# Patient Record
Sex: Female | Born: 1965 | Race: Black or African American | Hispanic: No | Marital: Married | Smoking: Current every day smoker
Health system: Southern US, Community
[De-identification: ages and names within clinical notes are randomized; demographics above are authoritative.]

## PROBLEM LIST (undated history)

## (undated) DIAGNOSIS — I10 Essential (primary) hypertension: Secondary | ICD-10-CM

## (undated) DIAGNOSIS — J302 Other seasonal allergic rhinitis: Secondary | ICD-10-CM

## (undated) DIAGNOSIS — F319 Bipolar disorder, unspecified: Secondary | ICD-10-CM

## (undated) DIAGNOSIS — Z973 Presence of spectacles and contact lenses: Secondary | ICD-10-CM

## (undated) DIAGNOSIS — K029 Dental caries, unspecified: Secondary | ICD-10-CM

## (undated) DIAGNOSIS — E7800 Pure hypercholesterolemia, unspecified: Secondary | ICD-10-CM

## (undated) DIAGNOSIS — Z972 Presence of dental prosthetic device (complete) (partial): Secondary | ICD-10-CM

## (undated) DIAGNOSIS — F419 Anxiety disorder, unspecified: Secondary | ICD-10-CM

## (undated) DIAGNOSIS — K219 Gastro-esophageal reflux disease without esophagitis: Secondary | ICD-10-CM

## (undated) HISTORY — PX: DENTAL SURGERY: SHX609

## (undated) HISTORY — PX: COLONOSCOPY: SHX174

## (undated) HISTORY — PX: TUBAL LIGATION: SHX77

## (undated) HISTORY — PX: UTERINE FIBROID SURGERY: SHX826

---

## 1999-10-02 ENCOUNTER — Encounter: Admission: RE | Admit: 1999-10-02 | Discharge: 1999-10-02 | Payer: Self-pay | Admitting: Nephrology

## 1999-10-02 ENCOUNTER — Encounter: Payer: Self-pay | Admitting: Nephrology

## 2001-01-28 ENCOUNTER — Emergency Department (HOSPITAL_COMMUNITY): Admission: EM | Admit: 2001-01-28 | Discharge: 2001-01-28 | Payer: Self-pay | Admitting: Emergency Medicine

## 2001-03-23 ENCOUNTER — Encounter: Payer: Self-pay | Admitting: *Deleted

## 2001-03-23 ENCOUNTER — Encounter: Admission: RE | Admit: 2001-03-23 | Discharge: 2001-03-23 | Payer: Self-pay | Admitting: *Deleted

## 2001-03-24 ENCOUNTER — Encounter
Admission: RE | Admit: 2001-03-24 | Discharge: 2001-04-27 | Payer: Self-pay | Admitting: Physical Medicine and Rehabilitation

## 2001-04-12 ENCOUNTER — Encounter: Payer: Self-pay | Admitting: Emergency Medicine

## 2001-04-12 ENCOUNTER — Emergency Department (HOSPITAL_COMMUNITY): Admission: EM | Admit: 2001-04-12 | Discharge: 2001-04-12 | Payer: Self-pay | Admitting: Emergency Medicine

## 2001-04-28 ENCOUNTER — Emergency Department (HOSPITAL_COMMUNITY): Admission: EM | Admit: 2001-04-28 | Discharge: 2001-04-28 | Payer: Self-pay | Admitting: Emergency Medicine

## 2002-01-10 ENCOUNTER — Inpatient Hospital Stay (HOSPITAL_COMMUNITY): Admission: RE | Admit: 2002-01-10 | Discharge: 2002-01-13 | Payer: Self-pay | Admitting: Obstetrics

## 2003-03-14 ENCOUNTER — Encounter: Admission: RE | Admit: 2003-03-14 | Discharge: 2003-06-12 | Payer: Self-pay

## 2003-12-10 ENCOUNTER — Ambulatory Visit (HOSPITAL_BASED_OUTPATIENT_CLINIC_OR_DEPARTMENT_OTHER): Admission: RE | Admit: 2003-12-10 | Discharge: 2003-12-10 | Payer: Self-pay | Admitting: Specialist

## 2003-12-10 ENCOUNTER — Ambulatory Visit (HOSPITAL_COMMUNITY): Admission: RE | Admit: 2003-12-10 | Discharge: 2003-12-10 | Payer: Self-pay | Admitting: Specialist

## 2004-12-26 ENCOUNTER — Ambulatory Visit (HOSPITAL_COMMUNITY): Admission: RE | Admit: 2004-12-26 | Discharge: 2004-12-26 | Payer: Self-pay | Admitting: Obstetrics

## 2005-03-29 ENCOUNTER — Emergency Department (HOSPITAL_COMMUNITY): Admission: EM | Admit: 2005-03-29 | Discharge: 2005-03-29 | Payer: Self-pay | Admitting: Family Medicine

## 2006-07-10 ENCOUNTER — Emergency Department (HOSPITAL_COMMUNITY): Admission: EM | Admit: 2006-07-10 | Discharge: 2006-07-10 | Payer: Self-pay | Admitting: Family Medicine

## 2010-08-17 ENCOUNTER — Encounter: Payer: Self-pay | Admitting: Obstetrics

## 2011-08-25 ENCOUNTER — Other Ambulatory Visit: Payer: Self-pay | Admitting: Obstetrics

## 2015-11-18 DIAGNOSIS — F331 Major depressive disorder, recurrent, moderate: Secondary | ICD-10-CM | POA: Diagnosis not present

## 2016-02-07 DIAGNOSIS — F331 Major depressive disorder, recurrent, moderate: Secondary | ICD-10-CM | POA: Diagnosis not present

## 2016-03-14 DIAGNOSIS — S161XXA Strain of muscle, fascia and tendon at neck level, initial encounter: Secondary | ICD-10-CM | POA: Diagnosis not present

## 2016-03-14 DIAGNOSIS — N959 Unspecified menopausal and perimenopausal disorder: Secondary | ICD-10-CM | POA: Diagnosis not present

## 2017-02-12 DIAGNOSIS — J302 Other seasonal allergic rhinitis: Secondary | ICD-10-CM | POA: Diagnosis not present

## 2017-02-12 DIAGNOSIS — K219 Gastro-esophageal reflux disease without esophagitis: Secondary | ICD-10-CM | POA: Diagnosis not present

## 2017-02-12 DIAGNOSIS — I10 Essential (primary) hypertension: Secondary | ICD-10-CM | POA: Diagnosis not present

## 2017-02-24 DIAGNOSIS — F331 Major depressive disorder, recurrent, moderate: Secondary | ICD-10-CM | POA: Diagnosis not present

## 2017-04-23 DIAGNOSIS — Z79899 Other long term (current) drug therapy: Secondary | ICD-10-CM | POA: Diagnosis not present

## 2017-04-23 DIAGNOSIS — I10 Essential (primary) hypertension: Secondary | ICD-10-CM | POA: Diagnosis not present

## 2017-04-23 DIAGNOSIS — J302 Other seasonal allergic rhinitis: Secondary | ICD-10-CM | POA: Diagnosis not present

## 2017-04-23 DIAGNOSIS — K219 Gastro-esophageal reflux disease without esophagitis: Secondary | ICD-10-CM | POA: Diagnosis not present

## 2017-04-23 DIAGNOSIS — Z13228 Encounter for screening for other metabolic disorders: Secondary | ICD-10-CM | POA: Diagnosis not present

## 2017-04-23 DIAGNOSIS — E559 Vitamin D deficiency, unspecified: Secondary | ICD-10-CM | POA: Diagnosis not present

## 2017-06-10 DIAGNOSIS — F331 Major depressive disorder, recurrent, moderate: Secondary | ICD-10-CM | POA: Diagnosis not present

## 2017-09-22 DIAGNOSIS — F331 Major depressive disorder, recurrent, moderate: Secondary | ICD-10-CM | POA: Diagnosis not present

## 2018-02-17 DIAGNOSIS — F331 Major depressive disorder, recurrent, moderate: Secondary | ICD-10-CM | POA: Diagnosis not present

## 2018-02-17 DIAGNOSIS — F4310 Post-traumatic stress disorder, unspecified: Secondary | ICD-10-CM | POA: Diagnosis not present

## 2018-02-17 DIAGNOSIS — R69 Illness, unspecified: Secondary | ICD-10-CM | POA: Diagnosis not present

## 2018-04-07 DIAGNOSIS — F419 Anxiety disorder, unspecified: Secondary | ICD-10-CM | POA: Diagnosis not present

## 2018-04-07 DIAGNOSIS — F4321 Adjustment disorder with depressed mood: Secondary | ICD-10-CM | POA: Diagnosis not present

## 2018-04-07 DIAGNOSIS — J309 Allergic rhinitis, unspecified: Secondary | ICD-10-CM | POA: Diagnosis not present

## 2018-04-07 DIAGNOSIS — Z886 Allergy status to analgesic agent status: Secondary | ICD-10-CM | POA: Diagnosis not present

## 2018-04-07 DIAGNOSIS — R69 Illness, unspecified: Secondary | ICD-10-CM | POA: Diagnosis not present

## 2018-04-07 DIAGNOSIS — I10 Essential (primary) hypertension: Secondary | ICD-10-CM | POA: Diagnosis not present

## 2018-04-07 DIAGNOSIS — R251 Tremor, unspecified: Secondary | ICD-10-CM | POA: Diagnosis not present

## 2018-04-07 DIAGNOSIS — K5900 Constipation, unspecified: Secondary | ICD-10-CM | POA: Diagnosis not present

## 2018-04-07 DIAGNOSIS — K08409 Partial loss of teeth, unspecified cause, unspecified class: Secondary | ICD-10-CM | POA: Diagnosis not present

## 2018-04-07 DIAGNOSIS — Z720 Tobacco use: Secondary | ICD-10-CM | POA: Diagnosis not present

## 2018-05-10 DIAGNOSIS — R69 Illness, unspecified: Secondary | ICD-10-CM | POA: Diagnosis not present

## 2018-08-31 DIAGNOSIS — I10 Essential (primary) hypertension: Secondary | ICD-10-CM | POA: Diagnosis not present

## 2018-08-31 DIAGNOSIS — Z79899 Other long term (current) drug therapy: Secondary | ICD-10-CM | POA: Diagnosis not present

## 2019-09-25 ENCOUNTER — Other Ambulatory Visit: Payer: Self-pay

## 2019-09-25 ENCOUNTER — Emergency Department (HOSPITAL_COMMUNITY): Admission: EM | Admit: 2019-09-25 | Discharge: 2019-09-25 | Discharge status: Absent without leave | Payer: Medicare HMO

## 2019-09-25 ENCOUNTER — Emergency Department (HOSPITAL_COMMUNITY)
Admission: EM | Admit: 2019-09-25 | Discharge: 2019-09-25 | Disposition: A | Discharge status: Home / Self Care | Payer: Medicare HMO | Attending: Emergency Medicine | Admitting: Emergency Medicine

## 2019-09-25 ENCOUNTER — Encounter (HOSPITAL_COMMUNITY): Payer: Self-pay

## 2019-09-25 DIAGNOSIS — Y92410 Unspecified street and highway as the place of occurrence of the external cause: Secondary | ICD-10-CM | POA: Diagnosis not present

## 2019-09-25 DIAGNOSIS — M545 Low back pain: Secondary | ICD-10-CM | POA: Insufficient documentation

## 2019-09-25 DIAGNOSIS — M542 Cervicalgia: Secondary | ICD-10-CM | POA: Insufficient documentation

## 2019-09-25 DIAGNOSIS — Y999 Unspecified external cause status: Secondary | ICD-10-CM | POA: Diagnosis not present

## 2019-09-25 DIAGNOSIS — F17210 Nicotine dependence, cigarettes, uncomplicated: Secondary | ICD-10-CM | POA: Insufficient documentation

## 2019-09-25 DIAGNOSIS — Y939 Activity, unspecified: Secondary | ICD-10-CM | POA: Insufficient documentation

## 2019-09-25 HISTORY — DX: Essential (primary) hypertension: I10

## 2019-09-25 MED ORDER — ACETAMINOPHEN 325 MG PO TABS
650.0000 mg | ORAL_TABLET | Freq: Once | ORAL | Status: AC
  Administered 2019-09-25: 650 mg via ORAL
  Filled 2019-09-25: qty 2

## 2019-09-25 MED ORDER — CYCLOBENZAPRINE HCL 10 MG PO TABS: 10.0000 mg | ORAL_TABLET | Freq: Two times a day (BID) | ORAL | 0 refills | Status: DC | PRN

## 2019-09-25 MED ORDER — LIDOCAINE 5 % EX PTCH
1.0000 | MEDICATED_PATCH | Freq: Once | CUTANEOUS | Status: DC
  Administered 2019-09-25: 1 via TRANSDERMAL
  Filled 2019-09-25: qty 1
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
## 2020-02-22 NOTE — H&P (Addendum)
HISTORY AND PHYSICAL  Vickie Alvarado is a 54 y.o. female patient with CC: Painful teeth.  No diagnosis found.  Past Medical History:  Diagnosis Date  . Hypertension     No current facility-administered medications for this encounter.   Current Outpatient Medications  Medication Sig Dispense Refill  . cyclobenzaprine (FLEXERIL) 10 MG tablet Take 1 tablet (10 mg total) by mouth 2 (two) times daily as needed for muscle spasms. 10 tablet 0   Allergies  Allergen Reactions  . Ibuprofen Anaphylaxis   Active Problems:   * No active hospital problems. *    Past Medical History:  High Blood Pressure    Medications: Gabapentin, Levocetirizine, Atorvastatin, Quetiapine, Benzotropine    Allergies:     Ibuprofen    Surgeries:   Oral Surgery sedation, C Section         Social History       Smoking: n          Alcohol:  n Drug use:    n                           Exam: BMI 27. Gross decay all remainin teeth # 2, 3, 5, 6, 10, 11, 18, 20, 21, 22, 23, 24, 25, 26, 27, 28, 29.    No purulence, edema, fluctuance, trismus. Oral cancer screening negative. Pharynx clear. No lymphadenopathy.  Heart- RRR  Lungs-Clear  Panorex:Gross decay all remainin teeth # 2, 3, 5, 6, 10, 11, 18, 20, 21, 22, 23, 24, 25, 26, 27, 28, 29.      Assessment: ASA  2. Non-restorable  Teeth  #2, 3, 5, 6, 10, 11, 18, 20, 21, 22, 23, 24, 25, 26, 27, 28, 29.              Plan: Extraction Teeth # 2, 3, 5, 6, 10, 11, 18, 20, 21, 22, 23, 24, 25, 26, 27, 28, 29. Alveoloplasty.   Hospital Day surgery.                    Ocie Doyne 02/22/2020

## 2020-02-28 ENCOUNTER — Other Ambulatory Visit (HOSPITAL_COMMUNITY)
Admission: RE | Admit: 2020-02-28 | Discharge: 2020-02-28 | Disposition: A | Discharge status: Home / Self Care | Payer: Medicare HMO | Source: Ambulatory Visit | Attending: Oral Surgery | Admitting: Oral Surgery

## 2020-02-28 DIAGNOSIS — Z01812 Encounter for preprocedural laboratory examination: Secondary | ICD-10-CM | POA: Insufficient documentation

## 2020-02-28 DIAGNOSIS — Z20822 Contact with and (suspected) exposure to covid-19: Secondary | ICD-10-CM | POA: Diagnosis not present

## 2020-02-28 LAB — SARS CORONAVIRUS 2 (TAT 6-24 HRS): SARS Coronavirus 2: NEGATIVE

## 2020-02-29 ENCOUNTER — Encounter (HOSPITAL_COMMUNITY): Payer: Self-pay | Admitting: Oral Surgery

## 2020-02-29 ENCOUNTER — Ambulatory Visit (HOSPITAL_COMMUNITY): Payer: Medicare HMO | Admitting: Registered Nurse

## 2020-02-29 ENCOUNTER — Other Ambulatory Visit: Payer: Self-pay

## 2020-02-29 NOTE — Progress Notes (Signed)
Pt denies SOB, chest pain, and being under the care of a cardiologist. Pt stated that she receives primary care at [facility]. Pt denies having a stress test, echo and cardiac cath. Pt denies having an EKG and chest x ray. Pt denies recent labs. Pt made aware to stop taking Aspirin (unless otherwise advised by surgeon), vitamins, fish oil and herbal medications. Do not take any NSAIDs ie: Ibuprofen, Advil, Naproxen (Aleve), Motrin, BC and Goody Powder. Pt reminded to quarantine. Pt verbalized understanding of all pre-op instructions.

## 2020-03-01 ENCOUNTER — Ambulatory Visit (HOSPITAL_COMMUNITY)
Admission: RE | Admit: 2020-03-01 | Discharge: 2020-03-01 | Disposition: A | Discharge status: Home / Self Care | Payer: Medicare HMO | Attending: Oral Surgery | Admitting: Oral Surgery

## 2020-03-01 ENCOUNTER — Other Ambulatory Visit: Payer: Self-pay

## 2020-03-01 ENCOUNTER — Encounter (HOSPITAL_COMMUNITY)
Admission: RE | Disposition: A | Discharge status: Home / Self Care | Payer: Self-pay | Source: Home / Self Care | Attending: Oral Surgery

## 2020-03-01 ENCOUNTER — Encounter (HOSPITAL_COMMUNITY): Payer: Self-pay | Admitting: Oral Surgery

## 2020-03-01 DIAGNOSIS — Z79899 Other long term (current) drug therapy: Secondary | ICD-10-CM | POA: Diagnosis not present

## 2020-03-01 DIAGNOSIS — Z5309 Procedure and treatment not carried out because of other contraindication: Secondary | ICD-10-CM | POA: Diagnosis not present

## 2020-03-01 DIAGNOSIS — K029 Dental caries, unspecified: Secondary | ICD-10-CM | POA: Insufficient documentation

## 2020-03-01 DIAGNOSIS — U071 COVID-19: Secondary | ICD-10-CM | POA: Insufficient documentation

## 2020-03-01 DIAGNOSIS — F17200 Nicotine dependence, unspecified, uncomplicated: Secondary | ICD-10-CM | POA: Insufficient documentation

## 2020-03-01 DIAGNOSIS — I10 Essential (primary) hypertension: Secondary | ICD-10-CM | POA: Diagnosis not present

## 2020-03-01 HISTORY — DX: Gastro-esophageal reflux disease without esophagitis: K21.9

## 2020-03-01 HISTORY — DX: Dental caries, unspecified: K02.9

## 2020-03-01 HISTORY — DX: Presence of spectacles and contact lenses: Z97.3

## 2020-03-01 HISTORY — DX: Presence of dental prosthetic device (complete) (partial): Z97.2

## 2020-03-01 HISTORY — DX: Bipolar disorder, unspecified: F31.9

## 2020-03-01 HISTORY — DX: Pure hypercholesterolemia, unspecified: E78.00

## 2020-03-01 HISTORY — DX: Other seasonal allergic rhinitis: J30.2

## 2020-03-01 HISTORY — DX: Anxiety disorder, unspecified: F41.9

## 2020-03-01 LAB — CBC
HCT: 38.2 % (ref 36.0–46.0)
Hemoglobin: 12.8 g/dL (ref 12.0–15.0)
MCH: 29.8 pg (ref 26.0–34.0)
MCHC: 33.5 g/dL (ref 30.0–36.0)
MCV: 88.8 fL (ref 80.0–100.0)
Platelets: 283 10*3/uL (ref 150–400)
RBC: 4.3 MIL/uL (ref 3.87–5.11)
RDW: 12.7 % (ref 11.5–15.5)
WBC: 9.7 10*3/uL (ref 4.0–10.5)
nRBC: 0 % (ref 0.0–0.2)

## 2020-03-01 LAB — BASIC METABOLIC PANEL
Anion gap: 12 (ref 5–15)
BUN: 6 mg/dL (ref 6–20)
CO2: 32 mmol/L (ref 22–32)
Calcium: 9 mg/dL (ref 8.9–10.3)
Chloride: 90 mmol/L — ABNORMAL LOW (ref 98–111)
Creatinine, Ser: 0.64 mg/dL (ref 0.44–1.00)
GFR calc Af Amer: >60 mL/min (ref >60)
GFR calc non Af Amer: >60 mL/min (ref >60)
Glucose, Bld: 316 mg/dL — ABNORMAL HIGH (ref 70–99)
Potassium: 3 mmol/L — ABNORMAL LOW (ref 3.5–5.1)
Sodium: 134 mmol/L — ABNORMAL LOW (ref 135–145)

## 2020-03-01 LAB — SARS CORONAVIRUS 2 BY RT PCR (HOSPITAL ORDER, PERFORMED IN ~~LOC~~ HOSPITAL LAB): SARS Coronavirus 2: POSITIVE — AB

## 2020-03-01 SURGERY — DENTAL RESTORATION/EXTRACTIONS
Anesthesia: General | Site: Mouth

## 2020-03-01 MED ORDER — OXYMETAZOLINE HCL 0.05 % NA SOLN
NASAL | Status: AC
  Filled 2020-03-01: qty 30

## 2020-03-01 MED ORDER — LACTATED RINGERS IV SOLN: INTRAVENOUS | Status: DC

## 2020-03-01 MED ORDER — SODIUM CHLORIDE 0.9 % IR SOLN
Status: DC | PRN
  Administered 2020-03-01: 1000 mL

## 2020-03-01 MED ORDER — LIDOCAINE-EPINEPHRINE 2 %-1:100000 IJ SOLN
INTRAMUSCULAR | Status: DC | PRN
  Administered 2020-03-01: 20 mL via INTRADERMAL

## 2020-03-01 MED ORDER — FENTANYL CITRATE (PF) 250 MCG/5ML IJ SOLN
INTRAMUSCULAR | Status: AC
  Filled 2020-03-01: qty 5

## 2020-03-01 MED ORDER — LIDOCAINE-EPINEPHRINE 2 %-1:100000 IJ SOLN
INTRAMUSCULAR | Status: AC
  Filled 2020-03-01: qty 1

## 2020-03-01 MED ORDER — ORAL CARE MOUTH RINSE: 15.0000 mL | Freq: Once | OROMUCOSAL | Status: DC

## 2020-03-01 MED ORDER — MIDAZOLAM HCL 2 MG/2ML IJ SOLN
INTRAMUSCULAR | Status: AC
  Filled 2020-03-01: qty 2

## 2020-03-01 MED ORDER — CEFAZOLIN SODIUM-DEXTROSE 2-4 GM/100ML-% IV SOLN
2.0000 g | INTRAVENOUS | Status: DC
  Filled 2020-03-01: qty 100

## 2020-03-01 MED ORDER — 0.9 % SODIUM CHLORIDE (POUR BTL) OPTIME
TOPICAL | Status: DC | PRN
  Administered 2020-03-01: 1000 mL

## 2020-03-01 MED ORDER — CHLORHEXIDINE GLUCONATE 0.12 % MT SOLN
15.0000 mL | Freq: Once | OROMUCOSAL | Status: DC
  Filled 2020-03-01: qty 15

## 2020-03-01 NOTE — Anesthesia Preprocedure Evaluation (Addendum)
Anesthesia Evaluation    Reviewed: Allergy & Precautions, Patient's Chart, lab work & pertinent test results, Unable to perform ROS - Chart review only  Airway        Dental   Pulmonary neg pulmonary ROS, Current Smoker and Patient abstained from smoking.,           Cardiovascular hypertension, negative cardio ROS       Neuro/Psych PSYCHIATRIC DISORDERS Anxiety Bipolar Disorder negative neurological ROS  negative psych ROS   GI/Hepatic negative GI ROS, Neg liver ROS, GERD  ,  Endo/Other  negative endocrine ROS  Renal/GU negative Renal ROS     Musculoskeletal negative musculoskeletal ROS (+)   Abdominal   Peds  Hematology negative hematology ROS (+)   Anesthesia Other Findings   Reproductive/Obstetrics negative OB ROS                            Anesthesia Physical Anesthesia Plan  ASA: III  Anesthesia Plan: General   Post-op Pain Management:    Induction: Intravenous  PONV Risk Score and Plan: Midazolam, Ondansetron, Dexamethasone and Treatment may vary due to age or medical condition  Airway Management Planned: Video Laryngoscope Planned and Nasal ETT  Additional Equipment: None  Intra-op Plan:   Post-operative Plan: Extubation in OR  Informed Consent: I have reviewed the patients History and Physical, chart, labs and discussed the procedure including the risks, benefits and alternatives for the proposed anesthesia with the patient or authorized representative who has indicated his/her understanding and acceptance.     Dental advisory given  Plan Discussed with: CRNA  Anesthesia Plan Comments: (Febrile (100.8) in preop, some mild cough. Rapid covid given current state of spread in our region.)       Anesthesia Quick Evaluation

## 2020-03-01 NOTE — Progress Notes (Signed)
Patient's presented with a temperature of 100.8.  Patient stated that she had a runny nose earlier in the week.  Patient's covid test was negative on 02/28/20.  Dr. Renold Don notified of patient's temperature and gave verbal order to repeat covid test.  Patient's covid test positive.  Dr. Barbette Merino aware and procedure is cancelled.  Patient aware and was educated that she needs to quarantine for 14 days.  Encouraged patient to have her family members tested as well.  Patient verbalized understanding.  Patient instructed to call Dr. Randa Evens office to reschedule procedure after quarantine period.

## 2020-03-01 NOTE — H&P (Signed)
H&P documentation  -History and Physical Reviewed  -Patient has been re-examined  -No change in the plan of care  Vickie Alvarado  

## 2020-03-01 NOTE — Progress Notes (Signed)
IV removed, catheter intact.  Site clean and dry.   Patient taken to main entrance in wheelchair by nurse.  Patient was picked up by daughter.

## 2020-03-14 ENCOUNTER — Other Ambulatory Visit: Payer: Self-pay

## 2020-03-14 ENCOUNTER — Ambulatory Visit (HOSPITAL_COMMUNITY)
Admission: EM | Admit: 2020-03-14 | Discharge: 2020-03-14 | Disposition: A | Discharge status: Home / Self Care | Payer: Medicare HMO | Attending: Family Medicine | Admitting: Family Medicine

## 2020-03-14 DIAGNOSIS — Z01812 Encounter for preprocedural laboratory examination: Secondary | ICD-10-CM | POA: Diagnosis present

## 2020-03-14 DIAGNOSIS — Z20822 Contact with and (suspected) exposure to covid-19: Secondary | ICD-10-CM | POA: Diagnosis not present

## 2020-03-14 LAB — SARS CORONAVIRUS 2 (TAT 6-24 HRS): SARS Coronavirus 2: NEGATIVE

## 2020-03-14 NOTE — ED Triage Notes (Signed)
Pt requesting COVID testing, denies symptoms

## 2020-03-20 ENCOUNTER — Other Ambulatory Visit: Payer: Self-pay

## 2020-03-20 ENCOUNTER — Emergency Department (HOSPITAL_COMMUNITY): Payer: Medicare HMO

## 2020-03-20 ENCOUNTER — Inpatient Hospital Stay (HOSPITAL_COMMUNITY)
Admission: EM | Admit: 2020-03-20 | Discharge: 2020-05-03 | DRG: 003 | Disposition: A | Discharge status: Skilled Nursing Facility | Payer: Medicare HMO | Attending: Internal Medicine | Admitting: Internal Medicine

## 2020-03-20 ENCOUNTER — Encounter (HOSPITAL_COMMUNITY): Payer: Self-pay | Admitting: Emergency Medicine

## 2020-03-20 ENCOUNTER — Inpatient Hospital Stay (HOSPITAL_COMMUNITY): Payer: Medicare HMO

## 2020-03-20 DIAGNOSIS — D638 Anemia in other chronic diseases classified elsewhere: Secondary | ICD-10-CM | POA: Diagnosis present

## 2020-03-20 DIAGNOSIS — E46 Unspecified protein-calorie malnutrition: Secondary | ICD-10-CM | POA: Diagnosis present

## 2020-03-20 DIAGNOSIS — R29810 Facial weakness: Secondary | ICD-10-CM | POA: Diagnosis present

## 2020-03-20 DIAGNOSIS — F1210 Cannabis abuse, uncomplicated: Secondary | ICD-10-CM | POA: Diagnosis present

## 2020-03-20 DIAGNOSIS — E7800 Pure hypercholesterolemia, unspecified: Secondary | ICD-10-CM | POA: Diagnosis present

## 2020-03-20 DIAGNOSIS — J302 Other seasonal allergic rhinitis: Secondary | ICD-10-CM | POA: Diagnosis present

## 2020-03-20 DIAGNOSIS — G939 Disorder of brain, unspecified: Secondary | ICD-10-CM

## 2020-03-20 DIAGNOSIS — F17210 Nicotine dependence, cigarettes, uncomplicated: Secondary | ICD-10-CM | POA: Diagnosis present

## 2020-03-20 DIAGNOSIS — R29898 Other symptoms and signs involving the musculoskeletal system: Secondary | ICD-10-CM | POA: Diagnosis present

## 2020-03-20 DIAGNOSIS — F319 Bipolar disorder, unspecified: Secondary | ICD-10-CM | POA: Diagnosis present

## 2020-03-20 DIAGNOSIS — B954 Other streptococcus as the cause of diseases classified elsewhere: Secondary | ICD-10-CM | POA: Diagnosis present

## 2020-03-20 DIAGNOSIS — Z7189 Other specified counseling: Secondary | ICD-10-CM | POA: Diagnosis not present

## 2020-03-20 DIAGNOSIS — R059 Cough, unspecified: Secondary | ICD-10-CM | POA: Diagnosis not present

## 2020-03-20 DIAGNOSIS — Z6841 Body Mass Index (BMI) 40.0 and over, adult: Secondary | ICD-10-CM | POA: Diagnosis not present

## 2020-03-20 DIAGNOSIS — Z515 Encounter for palliative care: Secondary | ICD-10-CM

## 2020-03-20 DIAGNOSIS — I63432 Cerebral infarction due to embolism of left posterior cerebral artery: Secondary | ICD-10-CM | POA: Diagnosis present

## 2020-03-20 DIAGNOSIS — R627 Adult failure to thrive: Secondary | ICD-10-CM | POA: Diagnosis not present

## 2020-03-20 DIAGNOSIS — G9389 Other specified disorders of brain: Secondary | ICD-10-CM | POA: Diagnosis present

## 2020-03-20 DIAGNOSIS — I10 Essential (primary) hypertension: Secondary | ICD-10-CM | POA: Diagnosis present

## 2020-03-20 DIAGNOSIS — R471 Dysarthria and anarthria: Secondary | ICD-10-CM | POA: Diagnosis present

## 2020-03-20 DIAGNOSIS — G0490 Encephalitis and encephalomyelitis, unspecified: Secondary | ICD-10-CM | POA: Diagnosis not present

## 2020-03-20 DIAGNOSIS — R8290 Unspecified abnormal findings in urine: Secondary | ICD-10-CM | POA: Diagnosis present

## 2020-03-20 DIAGNOSIS — R4182 Altered mental status, unspecified: Secondary | ICD-10-CM | POA: Diagnosis not present

## 2020-03-20 DIAGNOSIS — E1165 Type 2 diabetes mellitus with hyperglycemia: Secondary | ICD-10-CM | POA: Diagnosis present

## 2020-03-20 DIAGNOSIS — R140 Abdominal distension (gaseous): Secondary | ICD-10-CM | POA: Diagnosis not present

## 2020-03-20 DIAGNOSIS — L89154 Pressure ulcer of sacral region, stage 4: Secondary | ICD-10-CM | POA: Diagnosis not present

## 2020-03-20 DIAGNOSIS — J9690 Respiratory failure, unspecified, unspecified whether with hypoxia or hypercapnia: Secondary | ICD-10-CM

## 2020-03-20 DIAGNOSIS — Z20822 Contact with and (suspected) exposure to covid-19: Secondary | ICD-10-CM | POA: Diagnosis present

## 2020-03-20 DIAGNOSIS — G9341 Metabolic encephalopathy: Secondary | ICD-10-CM | POA: Diagnosis not present

## 2020-03-20 DIAGNOSIS — I6340 Cerebral infarction due to embolism of unspecified cerebral artery: Secondary | ICD-10-CM | POA: Insufficient documentation

## 2020-03-20 DIAGNOSIS — R159 Full incontinence of feces: Secondary | ICD-10-CM | POA: Diagnosis not present

## 2020-03-20 DIAGNOSIS — Z79899 Other long term (current) drug therapy: Secondary | ICD-10-CM | POA: Diagnosis not present

## 2020-03-20 DIAGNOSIS — Z978 Presence of other specified devices: Secondary | ICD-10-CM

## 2020-03-20 DIAGNOSIS — G928 Other toxic encephalopathy: Secondary | ICD-10-CM | POA: Diagnosis present

## 2020-03-20 DIAGNOSIS — G060 Intracranial abscess and granuloma: Principal | ICD-10-CM

## 2020-03-20 DIAGNOSIS — J95851 Ventilator associated pneumonia: Secondary | ICD-10-CM | POA: Diagnosis not present

## 2020-03-20 DIAGNOSIS — Z8616 Personal history of COVID-19: Secondary | ICD-10-CM

## 2020-03-20 DIAGNOSIS — G936 Cerebral edema: Secondary | ICD-10-CM | POA: Diagnosis present

## 2020-03-20 DIAGNOSIS — K029 Dental caries, unspecified: Secondary | ICD-10-CM | POA: Diagnosis present

## 2020-03-20 DIAGNOSIS — Z23 Encounter for immunization: Secondary | ICD-10-CM | POA: Diagnosis present

## 2020-03-20 DIAGNOSIS — K219 Gastro-esophageal reflux disease without esophagitis: Secondary | ICD-10-CM | POA: Diagnosis present

## 2020-03-20 DIAGNOSIS — J9601 Acute respiratory failure with hypoxia: Secondary | ICD-10-CM | POA: Diagnosis not present

## 2020-03-20 DIAGNOSIS — G935 Compression of brain: Secondary | ICD-10-CM | POA: Diagnosis present

## 2020-03-20 DIAGNOSIS — K056 Periodontal disease, unspecified: Secondary | ICD-10-CM | POA: Diagnosis present

## 2020-03-20 DIAGNOSIS — Z931 Gastrostomy status: Secondary | ICD-10-CM

## 2020-03-20 DIAGNOSIS — R404 Transient alteration of awareness: Secondary | ICD-10-CM | POA: Diagnosis not present

## 2020-03-20 DIAGNOSIS — F419 Anxiety disorder, unspecified: Secondary | ICD-10-CM | POA: Diagnosis present

## 2020-03-20 DIAGNOSIS — J9611 Chronic respiratory failure with hypoxia: Secondary | ICD-10-CM | POA: Diagnosis not present

## 2020-03-20 DIAGNOSIS — Z8249 Family history of ischemic heart disease and other diseases of the circulatory system: Secondary | ICD-10-CM

## 2020-03-20 DIAGNOSIS — E876 Hypokalemia: Secondary | ICD-10-CM

## 2020-03-20 DIAGNOSIS — R4701 Aphasia: Secondary | ICD-10-CM | POA: Diagnosis present

## 2020-03-20 DIAGNOSIS — E785 Hyperlipidemia, unspecified: Secondary | ICD-10-CM | POA: Diagnosis present

## 2020-03-20 DIAGNOSIS — R1310 Dysphagia, unspecified: Secondary | ICD-10-CM | POA: Diagnosis present

## 2020-03-20 LAB — DIFFERENTIAL
Abs Immature Granulocytes: 0.06 10*3/uL (ref 0.00–0.07)
Basophils Absolute: 0 10*3/uL (ref 0.0–0.1)
Basophils Relative: 0 %
Eosinophils Absolute: 0 10*3/uL (ref 0.0–0.5)
Eosinophils Relative: 0 %
Immature Granulocytes: 1 %
Lymphocytes Relative: 22 %
Lymphs Abs: 2.1 10*3/uL (ref 0.7–4.0)
Monocytes Absolute: 0.6 10*3/uL (ref 0.1–1.0)
Monocytes Relative: 7 %
Neutro Abs: 6.7 10*3/uL (ref 1.7–7.7)
Neutrophils Relative %: 70 %

## 2020-03-20 LAB — COMPREHENSIVE METABOLIC PANEL
ALT: 14 U/L (ref 0–44)
AST: 11 U/L — ABNORMAL LOW (ref 15–41)
Albumin: 3.2 g/dL — ABNORMAL LOW (ref 3.5–5.0)
Alkaline Phosphatase: 103 U/L (ref 38–126)
Anion gap: 13 (ref 5–15)
BUN: 11 mg/dL (ref 6–20)
CO2: 24 mmol/L (ref 22–32)
Calcium: 9.3 mg/dL (ref 8.9–10.3)
Chloride: 97 mmol/L — ABNORMAL LOW (ref 98–111)
Creatinine, Ser: 0.44 mg/dL (ref 0.44–1.00)
GFR calc Af Amer: >60 mL/min (ref >60)
GFR calc non Af Amer: >60 mL/min (ref >60)
Glucose, Bld: 215 mg/dL — ABNORMAL HIGH (ref 70–99)
Potassium: 3.6 mmol/L (ref 3.5–5.1)
Sodium: 134 mmol/L — ABNORMAL LOW (ref 135–145)
Total Bilirubin: 0.6 mg/dL (ref 0.3–1.2)
Total Protein: 7.2 g/dL (ref 6.5–8.1)

## 2020-03-20 LAB — URINALYSIS, ROUTINE W REFLEX MICROSCOPIC
Bilirubin Urine: NEGATIVE
Glucose, UA: 50 mg/dL — AB
Hgb urine dipstick: NEGATIVE
Ketones, ur: NEGATIVE mg/dL
Nitrite: NEGATIVE
Protein, ur: 100 mg/dL — AB
Specific Gravity, Urine: >1.046 — ABNORMAL HIGH (ref 1.005–1.030)
pH: 6 (ref 5.0–8.0)

## 2020-03-20 LAB — CBC
HCT: 37.6 % (ref 36.0–46.0)
Hemoglobin: 12.5 g/dL (ref 12.0–15.0)
MCH: 29.3 pg (ref 26.0–34.0)
MCHC: 33.2 g/dL (ref 30.0–36.0)
MCV: 88.1 fL (ref 80.0–100.0)
Platelets: 360 10*3/uL (ref 150–400)
RBC: 4.27 MIL/uL (ref 3.87–5.11)
RDW: 13.9 % (ref 11.5–15.5)
WBC: 9.5 10*3/uL (ref 4.0–10.5)
nRBC: 0 % (ref 0.0–0.2)

## 2020-03-20 LAB — APTT: aPTT: 30 seconds (ref 24–36)

## 2020-03-20 LAB — CBG MONITORING, ED: Glucose-Capillary: 220 mg/dL — ABNORMAL HIGH (ref 70–99)

## 2020-03-20 LAB — ETHANOL: Alcohol, Ethyl (B): <10 mg/dL (ref <10)

## 2020-03-20 LAB — I-STAT BETA HCG BLOOD, ED (MC, WL, AP ONLY): I-stat hCG, quantitative: <5 m[IU]/mL (ref <5)

## 2020-03-20 LAB — RAPID URINE DRUG SCREEN, HOSP PERFORMED
Amphetamines: NOT DETECTED
Barbiturates: NOT DETECTED
Benzodiazepines: NOT DETECTED
Cocaine: NOT DETECTED
Opiates: NOT DETECTED
Tetrahydrocannabinol: POSITIVE — AB

## 2020-03-20 LAB — PROTIME-INR
INR: 1.2 (ref 0.8–1.2)
Prothrombin Time: 14.4 seconds (ref 11.4–15.2)

## 2020-03-20 IMAGING — MR MR HEAD WO/W CM
5 of 14 series · 14 of 48 positions shown · IV contrast (Yes GAD)
Comparison: CT studies earlier same day

CLINICAL DATA: Acute right-sided weakness. Brain tumor shown by CT
angiography.

EXAM:
MRI HEAD WITHOUT AND WITH CONTRAST
TECHNIQUE: Multiplanar, multiecho pulse sequences of the brain and surrounding
structures were obtained without and with intravenous contrast.
CONTRAST:  8.8mL GADAVIST GADOBUTROL 1 MMOL/ML IV SOLN

[Series 2: DWI · axial · 3.0mm · 0.94mm/px · z∈[-150,-7]mm · 6 of 100 slices shown (1 of 2)]
[im 1/100]
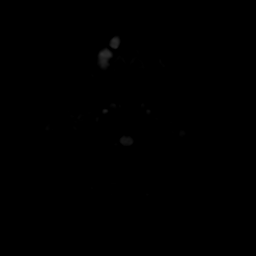
[im 20/100]
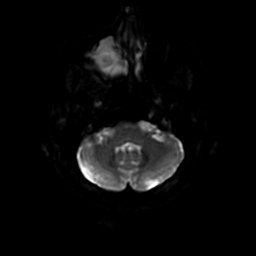
[im 40/100]
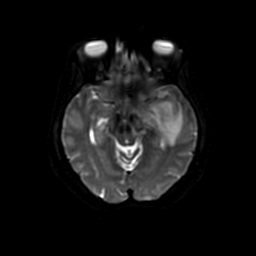
[im 60/100]
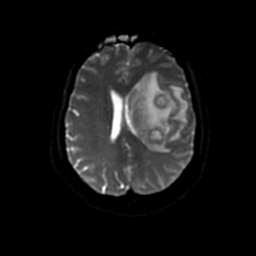
[im 80/100]
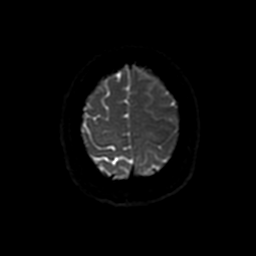
[im 100/100]
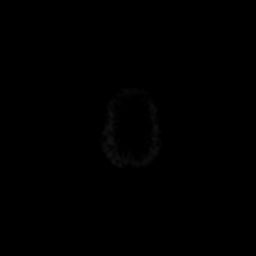

[Series 3: DWI · coronal · 4.0mm · 0.94mm/px · 2 of 74 slices shown (2 of 2)]
[im 1/74]
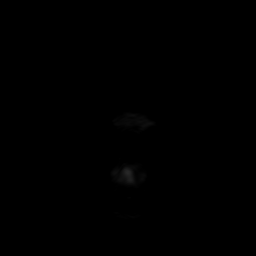
[im 19/74]
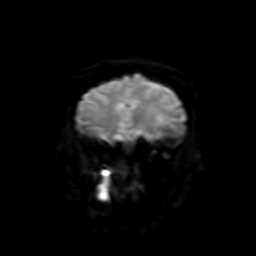

[Series 4: FLAIR · sagittal · 5.0mm · 0.23mm/px · 2 of 23 slices shown (1 of 2)]
[im 1/23]
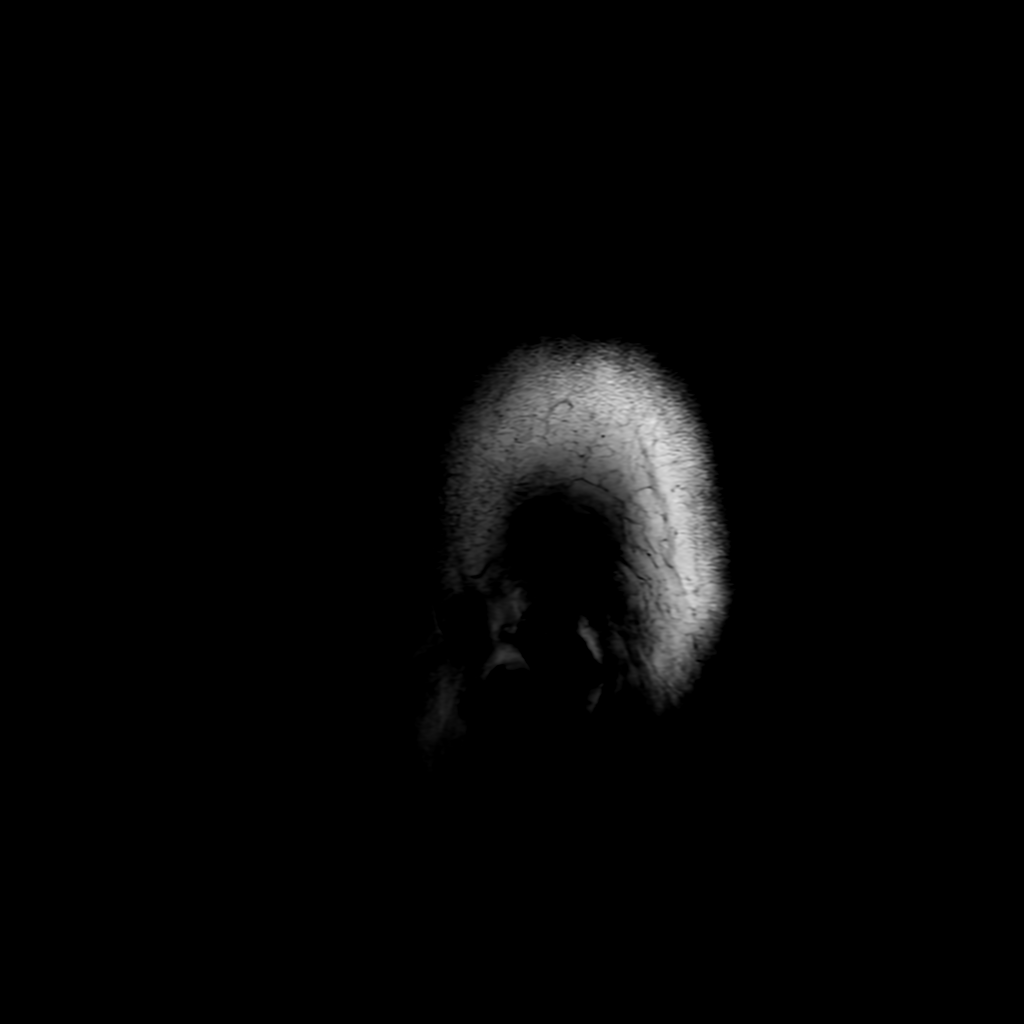
[im 23/23]
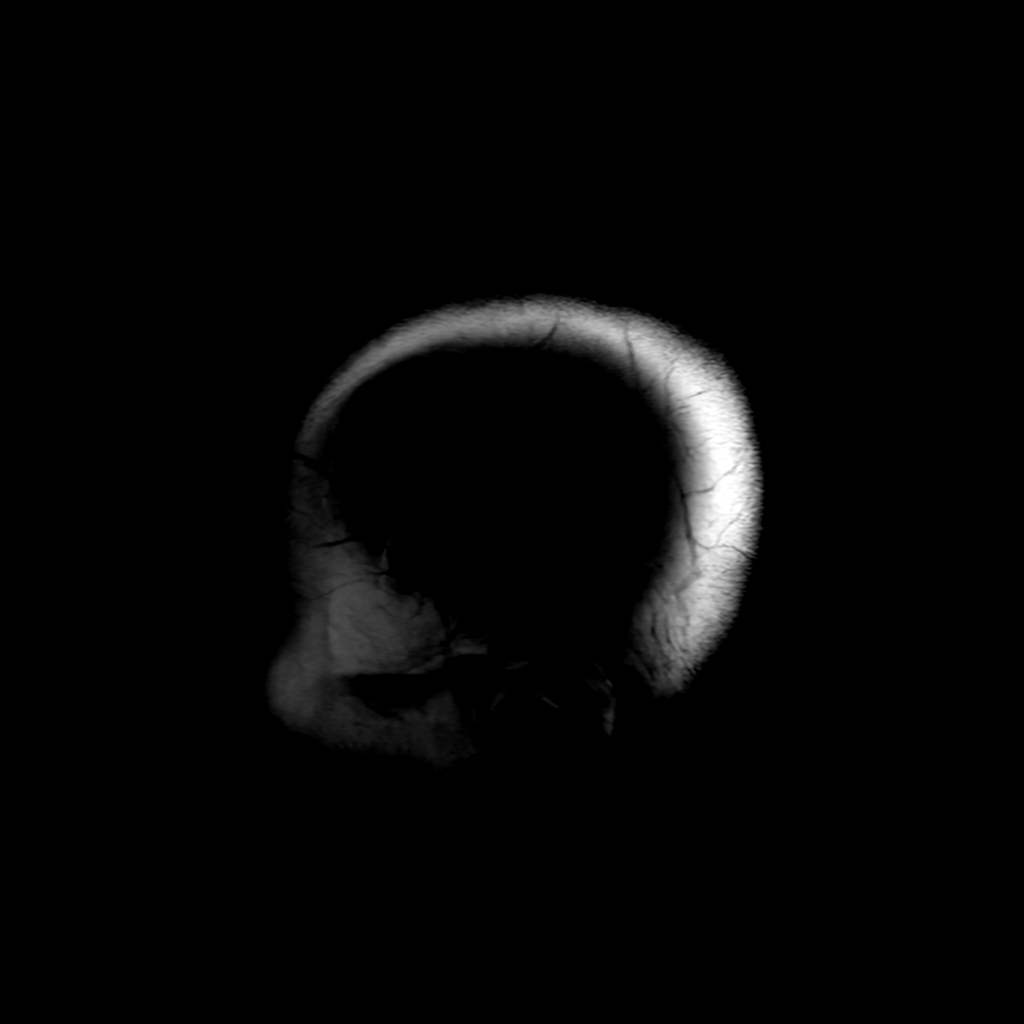

[Series 6: FLAIR · axial · 3.0mm · 0.41mm/px · z∈[-145,-5]mm · 2 of 25 slices shown (2 of 2)]
[im 1/25]
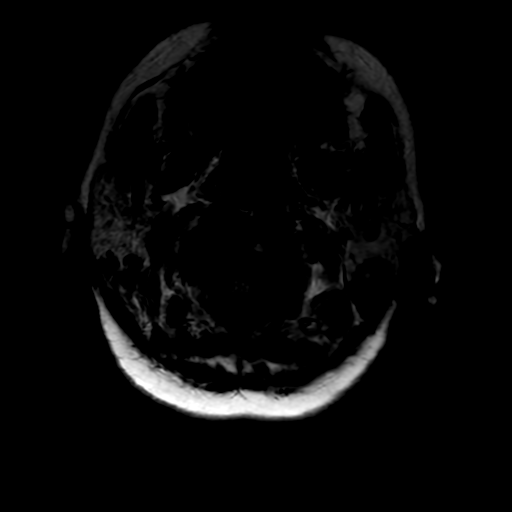
[im 25/25]
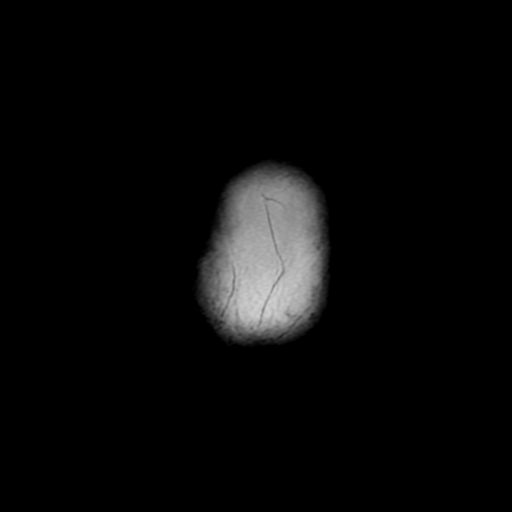

[Series 12: FLAIR post-contrast · sagittal · 5.0mm · 0.23mm/px · 2 of 23 slices shown]
[im 1/23]
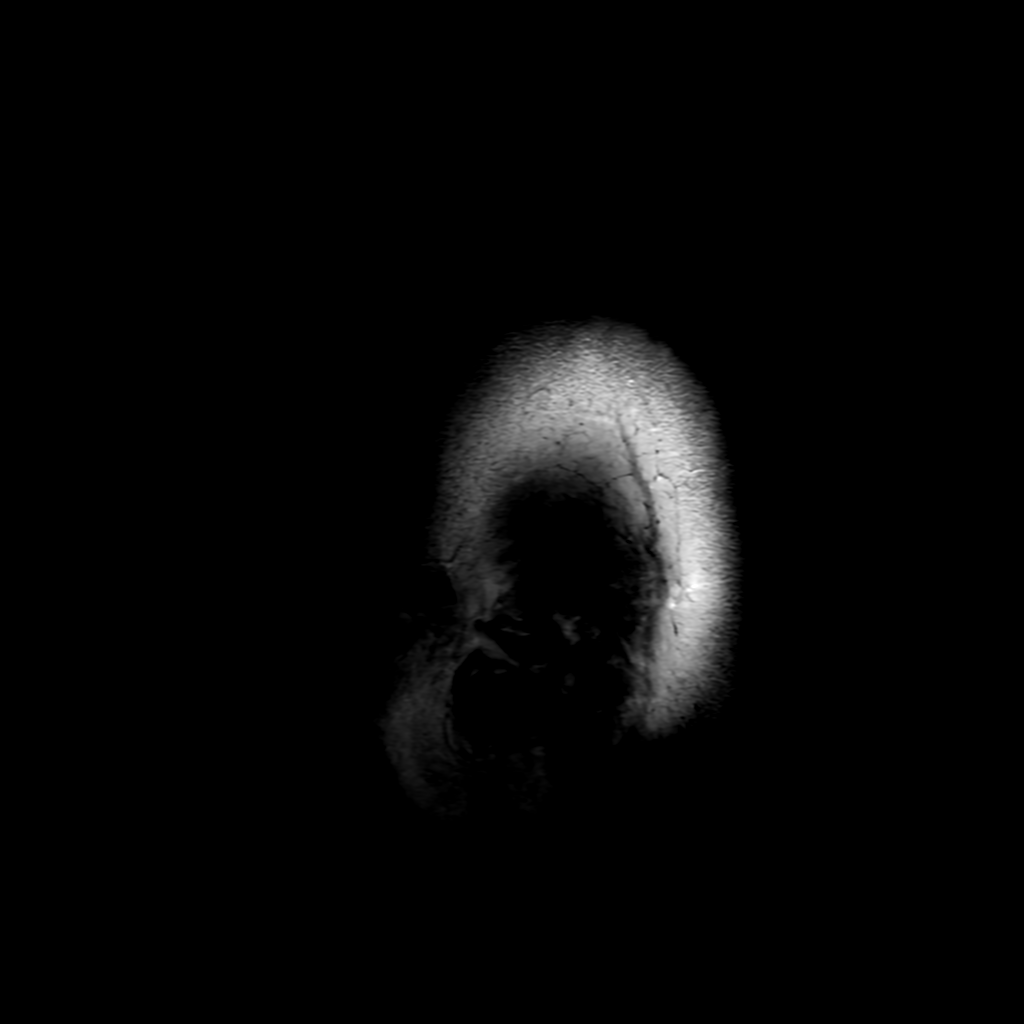
[im 23/23]
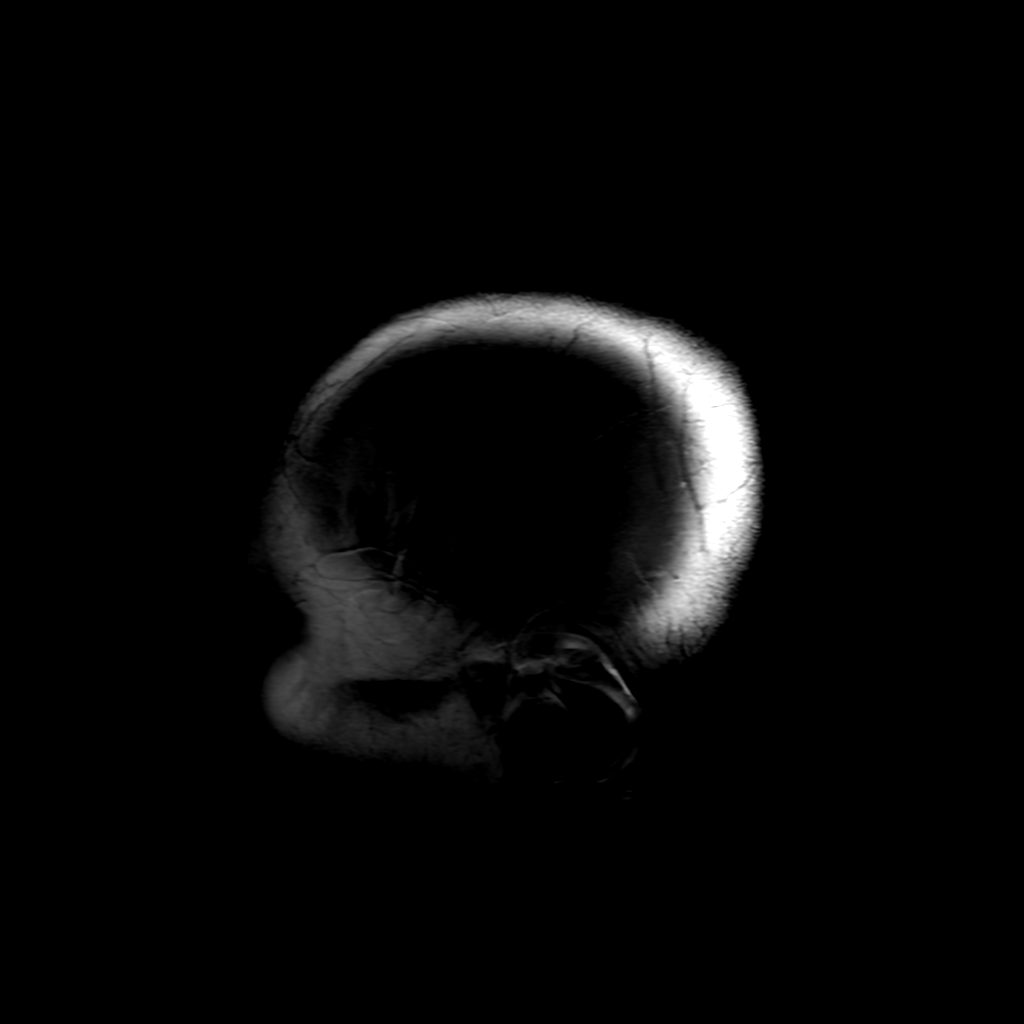

[14 of 48 positions shown; findings below may reference images not displayed]

FINDINGS: Brain: Approximately 6.5 X 4.5 x 4 cm tumor with the epicenter in
the left basal ganglia and radiating white matter tracts region.
Central necrosis. Contrast enhancement along the margins of the
necrosis. The enhancing region measures 6 x 2.4 x 3.5 cm. There is
mass effect with flattening of the left lateral ventricle.
Left-to-right midline shift of 6 mm. No ventricular trapping. No
satellite lesion identified. No second brain mass. No evidence of
ischemic stroke.

Vascular: Major vessels at the base of the brain show flow.

Skull and upper cervical spine: Negative

Sinuses/Orbits: Ostiomeatal unit pattern on the right with
opacification of the right maxillary, ethmoid and frontal sinuses.
Orbits negative.

Other: None
IMPRESSION: 6.5 x 4.5 x 4 cm tumor with the epicenter in the left basal ganglia
and radiating white matter tracts. Central necrosis. Contrast
enhancement along the margins of the necrosis. The enhancing region
measures 6 x 2.4 x 3.5 cm. There is mass effect with flattening of
the left lateral ventricle. Left-to-right midline shift of 6 mm. No
ventricular trapping. No satellite lesion identified. Findings
consistent with a malignant glioma, likely glioblastoma multiforme.

Right-sided paranasal sinusitis with ostiomeatal unit pattern.

## 2020-03-20 IMAGING — CT CT CHEST-ABD-PELV W/ CM
2 of 5 series · 13 of 36 positions shown, 15 images · IV contrast (Omni 300)
Comparison: None.

CLINICAL DATA: 54-year-old female with brain mass. Evaluate for
metastatic disease.

EXAM:
CT CHEST, ABDOMEN, AND PELVIS WITH CONTRAST
TECHNIQUE: Multidetector CT imaging of the chest, abdomen and pelvis was
performed following the standard protocol during bolus
administration of intravenous contrast.
CONTRAST:  100mL OMNIPAQUE IOHEXOL 350 MG/ML SOLN

[Series 3: cap with 5mm st · axial · 0.89mm/px · z∈[+835,+1385]mm · 10 of 134 slices shown, 12 images]
[im 12/134  mediastinal]
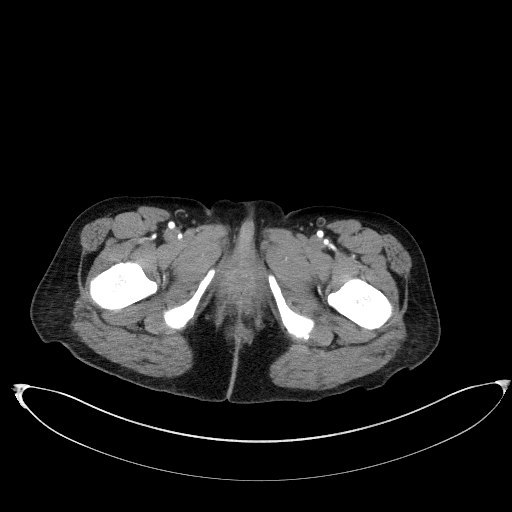
[im 12/134  bone]
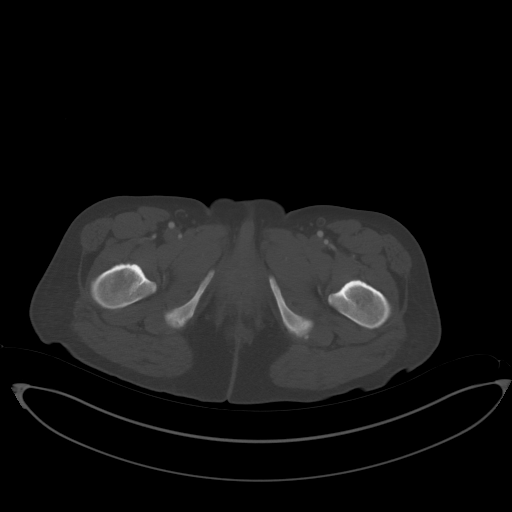
[im 23/134  mediastinal]
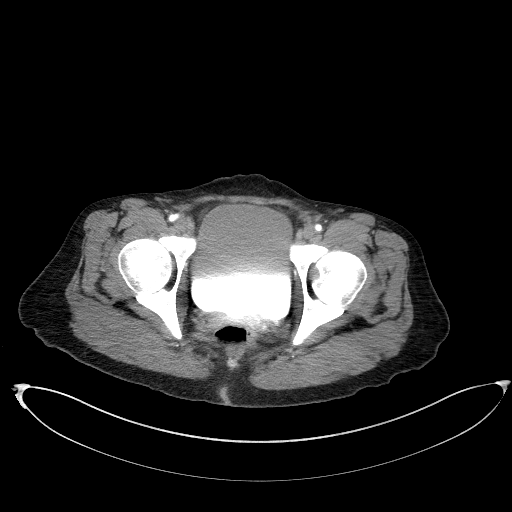
[im 34/134  mediastinal]
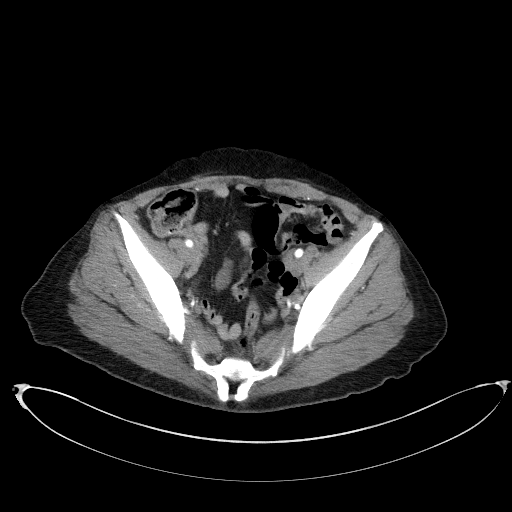
[im 45/134  mediastinal]
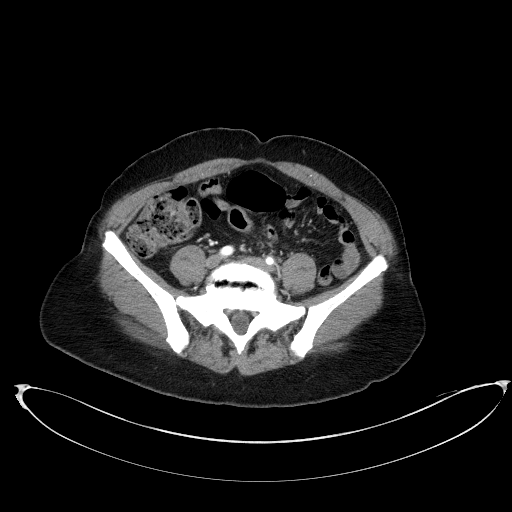
[im 56/134  mediastinal]
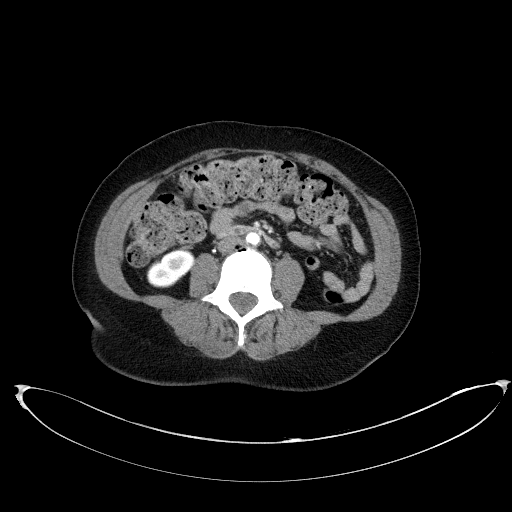
[im 78/134  mediastinal]
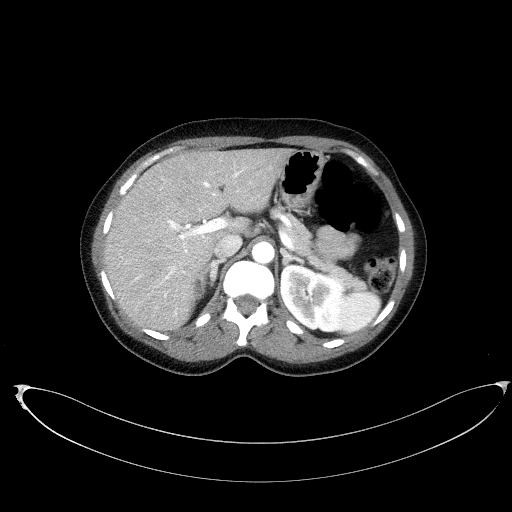
[im 89/134  mediastinal]
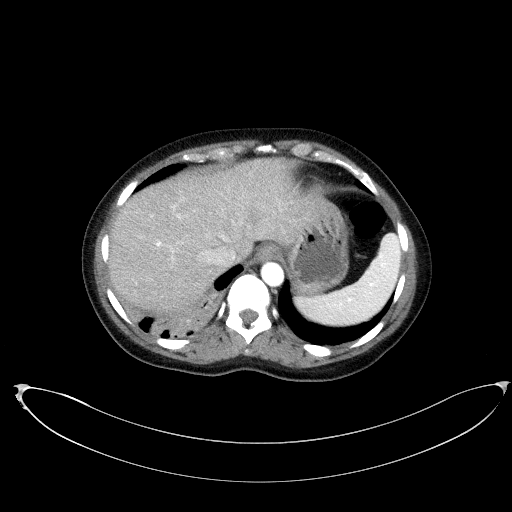
[im 100/134  mediastinal]
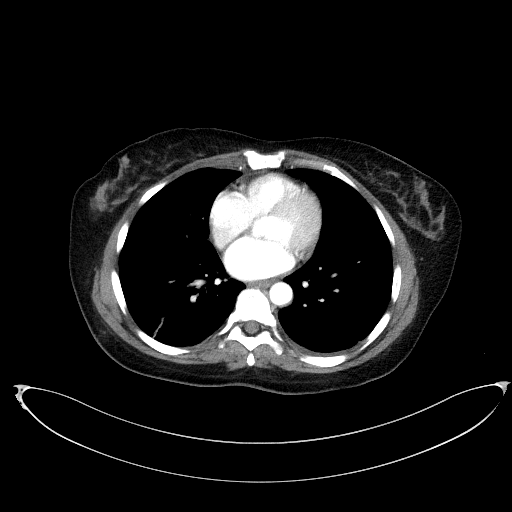
[im 111/134  mediastinal]
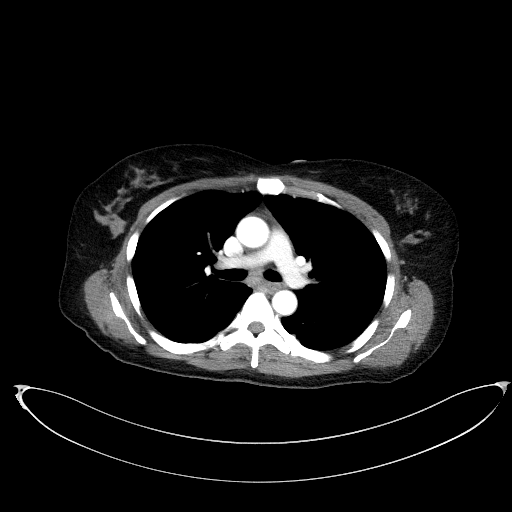
[im 111/134  bone]
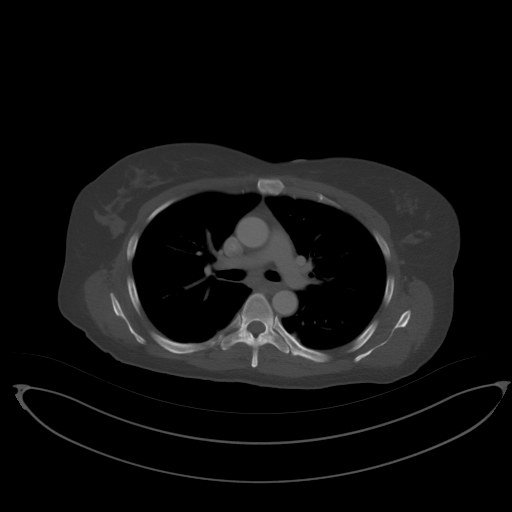
[im 122/134  mediastinal]
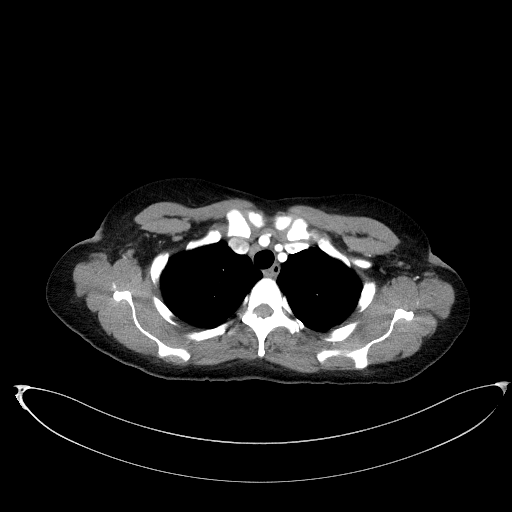

[Series 6: cap with 3mm st cor · coronal · 0.73mm/px · 3 of 142 slices shown]
[im 29/142  mediastinal]
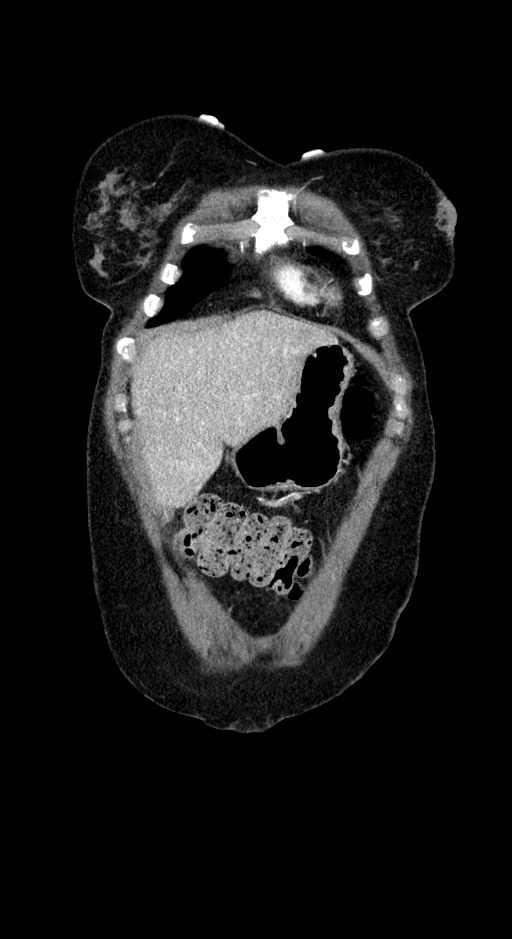
[im 57/142  mediastinal]
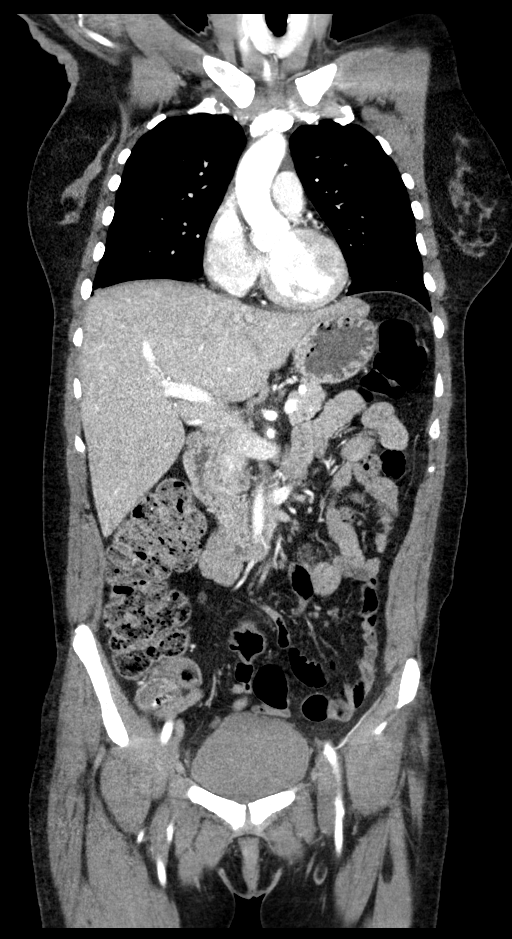
[im 85/142  mediastinal]
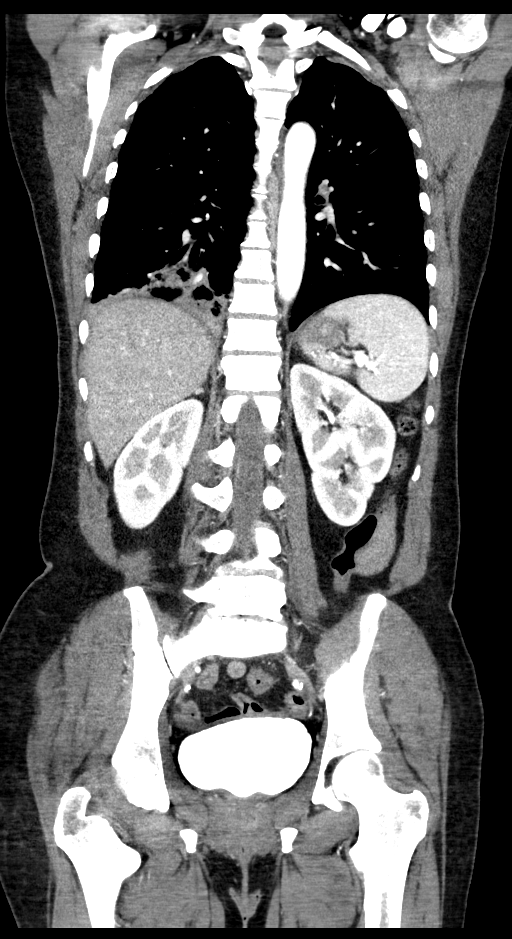

[13 of 36 positions shown; findings below may reference images not displayed]

FINDINGS: CT CHEST FINDINGS

Cardiovascular: There is no cardiomegaly or pericardial effusion.
There is noncalcified plaque along the thoracic aorta and aortic
arch. No aneurysmal dilatation or dissection. The origins of the
great vessels of the aortic arch appear patent. The central
pulmonary arteries are unremarkable for the degree of opacification.

Mediastinum/Nodes: Top-normal right hilar lymph nodes. No
adenopathy. The esophagus and the thyroid gland are grossly
unremarkable. No mediastinal fluid collection.

Lungs/Pleura: There is a patchy area of consolidation with air
bronchograms involving the right lower lobe which may represent
atelectasis but concerning for pneumonia. Aspiration is not
excluded. Clinical correlation and follow-up to resolution
recommended to exclude an underlying mass. Faint left lung base
linear and platelike atelectasis noted. There is no pleural effusion
or pneumothorax. The central airways are patent.

Musculoskeletal: No acute osseous pathology.

CT ABDOMEN PELVIS FINDINGS

No intra-abdominal free air. There is a small free fluid in the
pelvis.

Hepatobiliary: The liver is unremarkable. No intrahepatic biliary
ductal dilatation. The gallbladder is unremarkable.

Pancreas: Unremarkable. No pancreatic ductal dilatation or
surrounding inflammatory changes.

Spleen: Normal in size without focal abnormality.

Adrenals/Urinary Tract: The adrenal glands are unremarkable. There
is no hydronephrosis on either side. There is symmetric enhancement
and excretion of contrast by both kidneys. The visualized ureters
and urinary bladder appear unremarkable.

Stomach/Bowel: There is moderate stool throughout the colon. There
is no bowel obstruction or active inflammation. The appendix is
normal.

Vascular/Lymphatic: The abdominal aorta and IVC are unremarkable. No
portal venous gas. There is no adenopathy.

Reproductive: Hysterectomy. There is a 9 mm calcific focus in the
right posterior pelvis which is not evaluated but may be associated
with the ovary. Pelvic ultrasound may provide better evaluation.

Other: Anterior pelvic wall surgical scar.  No fluid collection.

Musculoskeletal: Degenerative changes of the spine. No acute osseous
pathology.
IMPRESSION: 1. Right lower lobe consolidation concerning for pneumonia.
Aspiration is not excluded. Clinical correlation and follow-up to
resolution recommended to exclude an underlying mass.
2. No evidence of metastatic disease in the chest, abdomen or
pelvis.
3. An indeterminate 9 mm calcific focus in the right posterior
pelvis may be associated with the ovary. Pelvic ultrasound may
provide better evaluation.
4. Aortic Atherosclerosis ([B7]-[B7]).

## 2020-03-20 IMAGING — CT CT ANGIO NECK
1 of 12 series · 5 of 33 positions shown · IV contrast (OMNI 350)
Comparison: None.

CLINICAL DATA: Acute neuro deficit with right-sided weakness.

EXAM:
CT ANGIOGRAPHY HEAD AND NECK
TECHNIQUE: Multidetector CT imaging of the head and neck was performed using
the standard protocol during bolus administration of intravenous
contrast. Multiplanar CT image reconstructions and MIPs were
obtained to evaluate the vascular anatomy. Carotid stenosis
measurements (when applicable) are obtained utilizing NASCET
criteria, using the distal internal carotid diameter as the
denominator.
CONTRAST:  50mL OMNIPAQUE IOHEXOL 350 MG/ML SOLN

[Series 11: cta neck axial · axial · 0.43mm/px · z∈[+882,+1098]mm · 5 of 326 slices shown]
[im 55/326  soft-tissue]
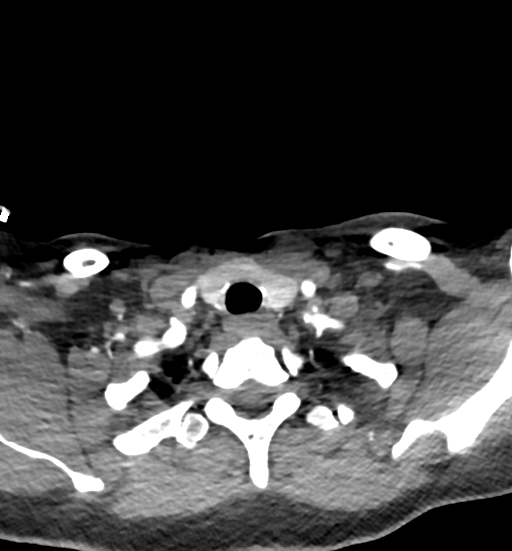
[im 109/326  bone]
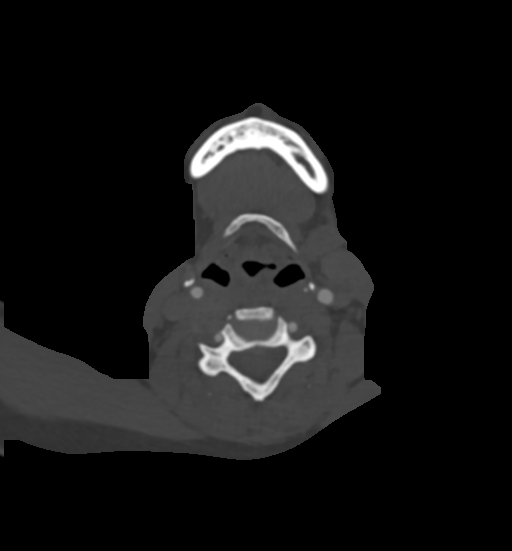
[im 163/326  soft-tissue]
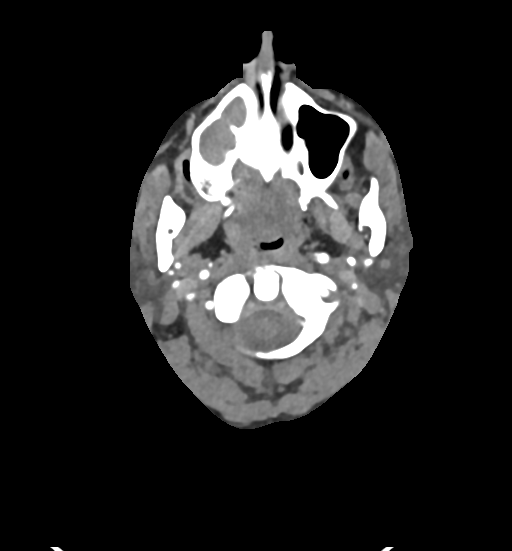
[im 217/326  bone]
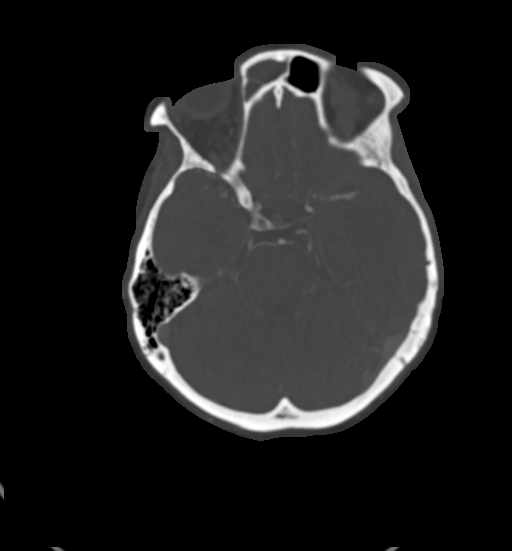
[im 271/326  soft-tissue]
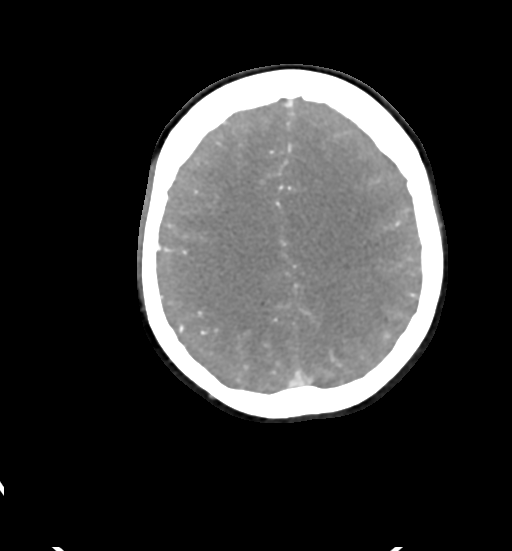

[5 of 33 positions shown; findings below may reference images not displayed]

FINDINGS: CT HEAD FINDINGS

Brain: Large mass lesion in the left frontal lobe. Delayed imaging
reveals irregular enhancement of the periphery with central
necrosis. The enhancing component of the mass measures approximately
53 x 28 mm. Surrounding low-density edema/tumor seen around the
enhancement. Low-density extends into the left basal ganglia. There
is compression of the left lateral ventricle and 7 mm midline shift
to the right. No acute hemorrhage. Negative for hydrocephalus

Vascular: Negative for hyperdense vessel

Skull: Negative

Sinuses: Complete opacification right maxillary sinus with bony
thickening. Mucosal edema extends into the right frontal and right
ethmoid sinus. Remaining sinuses clear. Mastoid clear bilaterally.

Orbits: Negative

Review of the MIP images confirms the above findings

CTA NECK FINDINGS

Aortic arch: Normal aortic arch. Azygos branching pattern. Proximal
great vessels widely patent.

Right carotid system: Right carotid widely patent. Mild noncalcified
plaque in the right carotid bulb.

Left carotid system: Left carotid widely patent. No significant
stenosis or atherosclerotic disease.

Vertebral arteries: Both vertebral arteries are widely patent to the
basilar without stenosis.

Skeleton: Disc degeneration and spurring C5-6 and C6-7. No acute
skeletal abnormality. Poor dentition.

Other neck: Negative for mass or adenopathy.

Upper chest: Lung apices clear bilaterally

Review of the MIP images confirms the above findings

CTA HEAD FINDINGS

Anterior circulation: Mild atherosclerotic calcification in the
cavernous carotid without stenosis. Right middle cerebral artery
widely patent. Hypoplastic right A1 segment. Azygos anterior
cerebral artery arising from the left without significant stenosis.

There is displacement and stretching of the left MCA branches due to
the large left frontal mass.

Posterior circulation: Both vertebral arteries patent to the
basilar. PICA patent bilaterally. Basilar widely patent. AICA,
superior cerebellar, and posterior cerebral arteries patent
bilaterally without stenosis.

Venous sinuses: Normal venous enhancement

Anatomic variants: None

Review of the MIP images confirms the above findings
IMPRESSION: 1. Large mass lesion left frontal lobe compatible with tumor. Favor
glioblastoma.
2. Negative for acute infarct or hemorrhage
3. No significant carotid or vertebral artery stenosis in the neck.
4. Negative for intracranial large vessel occlusion.

## 2020-03-20 MED ORDER — DEXAMETHASONE SODIUM PHOSPHATE 4 MG/ML IJ SOLN: 4.0000 mg | Freq: Four times a day (QID) | INTRAMUSCULAR | Status: DC

## 2020-03-20 MED ORDER — GADOBUTROL 1 MMOL/ML IV SOLN
8.8000 mL | Freq: Once | INTRAVENOUS | Status: AC | PRN
  Administered 2020-03-20: 8.8 mL via INTRAVENOUS

## 2020-03-20 MED ORDER — ONDANSETRON HCL 4 MG/2ML IJ SOLN
4.0000 mg | Freq: Once | INTRAMUSCULAR | Status: AC
  Administered 2020-03-20: 4 mg via INTRAVENOUS
  Filled 2020-03-20: qty 2

## 2020-03-20 MED ORDER — FAMOTIDINE IN NACL 20-0.9 MG/50ML-% IV SOLN
20.0000 mg | INTRAVENOUS | Status: DC
  Administered 2020-03-20 – 2020-03-22 (×3): 20 mg via INTRAVENOUS
  Filled 2020-03-20 (×3): qty 50

## 2020-03-20 MED ORDER — ENOXAPARIN SODIUM 40 MG/0.4ML ~~LOC~~ SOLN
40.0000 mg | SUBCUTANEOUS | Status: DC
  Administered 2020-03-20: 40 mg via SUBCUTANEOUS
  Filled 2020-03-20: qty 0.4

## 2020-03-20 MED ORDER — IOHEXOL 350 MG/ML SOLN
50.0000 mL | Freq: Once | INTRAVENOUS | Status: AC | PRN
  Administered 2020-03-20: 50 mL via INTRAVENOUS

## 2020-03-20 MED ORDER — SODIUM CHLORIDE 0.9 % IV SOLN: INTRAVENOUS | Status: DC

## 2020-03-20 MED ORDER — ONDANSETRON HCL 4 MG/2ML IJ SOLN
4.0000 mg | Freq: Four times a day (QID) | INTRAMUSCULAR | Status: DC | PRN
  Administered 2020-03-29: 4 mg via INTRAVENOUS
  Filled 2020-03-20: qty 2

## 2020-03-20 MED ORDER — LEVETIRACETAM IN NACL 500 MG/100ML IV SOLN
500.0000 mg | Freq: Two times a day (BID) | INTRAVENOUS | Status: DC
  Administered 2020-03-20 – 2020-04-07 (×36): 500 mg via INTRAVENOUS
  Filled 2020-03-20 (×39): qty 100

## 2020-03-20 MED ORDER — DEXAMETHASONE SODIUM PHOSPHATE 10 MG/ML IJ SOLN
10.0000 mg | Freq: Once | INTRAMUSCULAR | Status: AC
  Administered 2020-03-20: 10 mg via INTRAVENOUS
  Filled 2020-03-20: qty 1

## 2020-03-20 MED ORDER — SODIUM CHLORIDE 0.9 % IV BOLUS
500.0000 mL | Freq: Once | INTRAVENOUS | Status: AC
  Administered 2020-03-20: 500 mL via INTRAVENOUS

## 2020-03-20 MED ORDER — IOHEXOL 350 MG/ML SOLN
100.0000 mL | Freq: Once | INTRAVENOUS | Status: AC | PRN
  Administered 2020-03-20: 100 mL via INTRAVENOUS

## 2020-03-20 NOTE — Consult Note (Addendum)
CC: weakness, aphasia  HPI:     Patient is a 54 y.o. female presented to the hospital after family noticed several days of progressive right sided weakness, confusion, and speech difficulty.  She was recently diagnosed with COVID 2 weeks ago and had been self-isolating.  Additionally, she recently had numerous decayed teeth that were to be extracted but the procedure was cancelled.  She is an everyday smoker.    Patient Active Problem List   Diagnosis Date Noted  . Weakness of extremity 03/20/2020  . Brain mass   . Brain lesion    Past Medical History:  Diagnosis Date  . Anxiety   . Bipolar disorder (HCC)   . Dental caries    periodontal disease, exostosis left maxilla  . GERD (gastroesophageal reflux disease)   . Hypercholesterolemia   . Hypertension   . Seasonal allergies   . Wears dentures   . Wears glasses     Past Surgical History:  Procedure Laterality Date  . COLONOSCOPY    . DENTAL SURGERY     teeth extractions  . TUBAL LIGATION    . UTERINE FIBROID SURGERY      (Not in a hospital admission)  Allergies  Allergen Reactions  . Ibuprofen Anaphylaxis    Social History   Tobacco Use  . Smoking status: Current Every Day Smoker    Packs/day: 0.25    Types: Cigarettes  . Smokeless tobacco: Never Used  Substance Use Topics  . Alcohol use: Never    No family history on file.   Review of Systems Pertinent items are noted in HPI.  Objective:   Patient Vitals for the past 8 hrs:  BP Temp Temp src Pulse Resp SpO2  03/20/20 1718 (!) 143/69 99.4 F (37.4 C) Oral 77 19 100 %  03/20/20 1700 130/72 98.6 F (37 C) Oral 70 20 97 %   No intake/output data recorded. Total I/O In: 50 [IV Piggyback:50] Out: -     Awake, alert.  Eyes open spontaneouslyOriented to person, hospital.  Following simple commands, but some difficulty with complex commands.  Right sided pronator drift, RUE 3/5.  RLE 2/5.  LUE/LLE full strength.  Data Review CBC:  Lab Results   Component Value Date   WBC 9.5 03/20/2020   RBC 4.27 03/20/2020   BMP:  Lab Results  Component Value Date   GLUCOSE 215 (H) 03/20/2020   CO2 24 03/20/2020   BUN 11 03/20/2020   CREATININE 0.44 03/20/2020   CALCIUM 9.3 03/20/2020    CT head angio and MRI reviewed.  Assessment:   54 yo F with a left insular enhancing mass with surrounding edema.  The relative acuity of symptoms, presence of patchy diffusion restriction and sinusitis on MRI raise suspicion of intracranial abscess.  However, metastatic or primary brain tumor are also possibilities.  Plan:   - recommend blood cultures and hold dexamethasone for now - metastatic w/u with ct chest/abd/pelvis - she may ultimately require stereotactic biopsy/aspiration for diagnosis depending on further test results.  Discussed plan of care with family at bedside

## 2020-03-20 NOTE — H&P (Addendum)
History and Physical    Vickie Alvarado WUJ:[id number] DOB: [birth year] DOA: 03/20/2020  PCP: [facility] Patient coming from: home  Chief Complaint: Right-sided weakness  HPI: Vickie Alvarado is a 54 y.o. female with medical history significant of recent Covid diagnosed 03/01/2020 was isolating herself from the rest of the family members.  She lives at home with 2 of her sons.  Family checked in on her today and found that she was altered with right-sided weakness right facial droop and dysarthria.  Patient has been having decreased p.o. intake decreased appetite with projectile vomiting nausea and right-sided weakness. She has history of anxiety disorder bipolar disorder hypertension hypercholesterolemia. She has 2 daughters one lives in [place] and the one lives out of state both of them check in on her often. History is obtained from the patient's daughter and ER records.  Patient is aphasic and not able to provide me any history.   ED Course: Sodium 134 potassium 3.6 BUN 11 creatinine 0.44 white count 9.5.  Initially code stroke was initiated.  MRI of the brain showed 6.5 x 4.5 x 4 cm tumor in the left basal ganglia.  Review of Systems: As per HPI otherwise all other systems reviewed and are negative  Ambulatory Status: She was ambulatory at baseline. Past Medical History:  Diagnosis Date  . Anxiety   . Bipolar disorder ([place])   . Dental caries    periodontal disease, exostosis left maxilla  . GERD (gastroesophageal reflux disease)   . Hypercholesterolemia   . Hypertension   . Seasonal allergies   . Wears dentures   . Wears glasses     Past Surgical History:  Procedure Laterality Date  . COLONOSCOPY    . DENTAL SURGERY     teeth extractions  . TUBAL LIGATION    . UTERINE FIBROID SURGERY      Social History   Socioeconomic History  . Marital status: Married    Spouse name: Not on file  . Number of children: Not on file  . Years of education:  Not on file  . Highest education level: Not on file  Occupational History  . Not on file  Tobacco Use  . Smoking status: Current Every Day Smoker    Packs/day: 0.25    Types: Cigarettes  . Smokeless tobacco: Never Used  Vaping Use  . Vaping Use: Never used  Substance and Sexual Activity  . Alcohol use: Never  . Drug use: Never  . Sexual activity: Yes    Birth control/protection: None  Other Topics Concern  . Not on file  Social History Narrative  . Not on file   Social Determinants of Health   Financial Resource Strain:   . Difficulty of Paying Living Expenses: Not on file  Food Insecurity:   . Worried About Charity fundraiser in the Last Year: Not on file  . Ran Out of Food in the Last Year: Not on file  Transportation Needs:   . Lack of Transportation (Medical): Not on file  . Lack of Transportation (Non-Medical): Not on file  Physical Activity:   . Days of Exercise per Week: Not on file  . Minutes of Exercise per Session: Not on file  Stress:   . Feeling of Stress : Not on file  Social Connections:   . Frequency of Communication with Friends and Family: Not on file  . Frequency of Social Gatherings with Friends and Family: Not on file  . Attends Religious  Services: Not on file  . Active Member of Clubs or Organizations: Not on file  . Attends Banker Meetings: Not on file  . Marital Status: Not on file  Intimate Partner Violence:   . Fear of Current or Ex-Partner: Not on file  . Emotionally Abused: Not on file  . Physically Abused: Not on file  . Sexually Abused: Not on file    Allergies  Allergen Reactions  . Ibuprofen Anaphylaxis    No family history on file.    Prior to Admission medications   Medication Sig Start Date End Date Taking? Authorizing Provider  acetaminophen (TYLENOL) 500 MG tablet Take 1,000 mg by mouth every 6 (six) hours as needed for moderate pain or headache.    [provider]  amoxicillin (AMOXIL) 500 MG  capsule Take 500 mg by mouth daily.     [provider]  atorvastatin (LIPITOR) 20 MG tablet Take 20 mg by mouth daily. 02/17/20   [provider]  benztropine (COGENTIN) 1 MG tablet Take 1 mg by mouth at bedtime.  01/30/20   [provider]  cyclobenzaprine (FLEXERIL) 10 MG tablet Take 1 tablet (10 mg total) by mouth 2 (two) times daily as needed for muscle spasms. Patient not taking: Reported on 02/22/2020 09/25/19   Albrizze, Yvonna Alanis E, PA-C  DULoxetine (CYMBALTA) 30 MG capsule Take 30 mg by mouth daily. 01/30/20   [provider]  gabapentin (NEURONTIN) 400 MG capsule Take 400 mg by mouth 3 (three) times daily.    [provider]  levocetirizine (XYZAL) 5 MG tablet Take 5 mg by mouth daily. 02/10/20   [provider]  lisinopril-hydrochlorothiazide (ZESTORETIC) 10-12.5 MG tablet Take 1 tablet by mouth daily. 02/17/20   [provider]  omeprazole (PRILOSEC) 20 MG capsule Take 20 mg by mouth daily.    [provider]  QUEtiapine (SEROQUEL) 25 MG tablet Take 25 mg by mouth at bedtime. Take with 800 mg to equal 825 mg at bedtime 01/30/20   [provider]  QUEtiapine (SEROQUEL) 400 MG tablet Take 800 mg by mouth at bedtime. Take with the 25 mg to equal to 825 at bedtime 01/30/20   [provider]    Physical Exam: Vitals:   03/20/20 0955 03/20/20 1700 03/20/20 1718  BP: (!) 148/73 130/72 (!) 143/69  Pulse: 85 70 77  Resp: (!) 21 20 19   Temp: 98.2 F (36.8 C) 98.6 F (37 C) 99.4 F (37.4 C)  TempSrc: Oral Oral Oral  SpO2: 99% 97% 100%     . General: Appears calm and comfortable . Eyes: PERRL, EOMI, normal lids, iris . ENT: grossly normal hearing, lips & tongue, mmm . Neck: no LAD, masses or thyromegaly . Cardiovascular: RRR, no m/r/g. No LE edema.  Respiratory: CTA bilaterally, no w/r/r. Normal respiratory effort. . Abdomen: soft, ntnd, NABS . Skin:  no rash or induration seen on limited  exam . Musculoskeletal:  grossly normal tone BUE/BLE, good ROM, no bony abnormality . Psychiatric: grossly normal mood and affect, speech fluent and appropriate, AOx3 . Neurologic: Expressive aphasia noted.  4 x 5 right upper extremity 5 x 5 left upper extremity 3 x 5 right lower extremity.  Labs on Admission: I have personally reviewed following labs and imaging studies  CBC: Recent Labs  Lab 03/20/20 1011  WBC 9.5  NEUTROABS 6.7  HGB 12.5  HCT 37.6  MCV 88.1  PLT 360   Basic Metabolic Panel: Recent Labs  Lab 03/20/20  1011  NA 134*  K 3.6  CL 97*  CO2 24  GLUCOSE 215*  BUN 11  CREATININE 0.44  CALCIUM 9.3   GFR: CrCl cannot be calculated (Unknown ideal weight.). Liver Function Tests: Recent Labs  Lab 03/20/20 1011  AST 11*  ALT 14  ALKPHOS 103  BILITOT 0.6  PROT 7.2  ALBUMIN 3.2*   No results for input(s): LIPASE, AMYLASE in the last 168 hours. No results for input(s): AMMONIA in the last 168 hours. Coagulation Profile: Recent Labs  Lab 03/20/20 1011  INR 1.2   Cardiac Enzymes: No results for input(s): CKTOTAL, CKMB, CKMBINDEX, TROPONINI in the last 168 hours. BNP (last 3 results) No results for input(s): PROBNP in the last 8760 hours. HbA1C: No results for input(s): HGBA1C in the last 72 hours. CBG: Recent Labs  Lab 03/20/20 1006  GLUCAP 220*   Lipid Profile: No results for input(s): CHOL, HDL, LDLCALC, TRIG, CHOLHDL, LDLDIRECT in the last 72 hours. Thyroid Function Tests: No results for input(s): TSH, T4TOTAL, FREET4, T3FREE, THYROIDAB in the last 72 hours. Anemia Panel: No results for input(s): VITAMINB12, FOLATE, FERRITIN, TIBC, IRON, RETICCTPCT in the last 72 hours. Urine analysis:    Component Value Date/Time   COLORURINE AMBER (A) 03/20/2020 1630   APPEARANCEUR CLOUDY (A) 03/20/2020 1630   LABSPEC >1.046 (H) 03/20/2020 1630   PHURINE 6.0 03/20/2020 1630   GLUCOSEU 50 (A) 03/20/2020 1630   HGBUR NEGATIVE 03/20/2020 1630    BILIRUBINUR NEGATIVE 03/20/2020 1630   KETONESUR NEGATIVE 03/20/2020 1630   PROTEINUR 100 (A) 03/20/2020 1630   NITRITE NEGATIVE 03/20/2020 1630   LEUKOCYTESUR LARGE (A) 03/20/2020 1630    Creatinine Clearance: CrCl cannot be calculated (Unknown ideal weight.).  Sepsis Labs: @LABRCNTIP (procalcitonin:4,lacticidven:4) ) Recent Results (from the past 240 hour(s))  SARS CORONAVIRUS 2 (TAT 6-24 HRS) Nasopharyngeal Nasopharyngeal Swab     Status: None   Collection Time: 03/14/20  2:49 PM   Specimen: Nasopharyngeal Swab  Result Value Ref Range Status   SARS Coronavirus 2 NEGATIVE NEGATIVE Final    Comment: (NOTE) SARS-CoV-2 target nucleic acids are NOT DETECTED.  The SARS-CoV-2 RNA is generally detectable in upper and lower respiratory specimens during the acute phase of infection. Negative results do not preclude SARS-CoV-2 infection, do not rule out co-infections with other pathogens, and should not be used as the sole basis for treatment or other patient management decisions. Negative results must be combined with clinical observations, patient history, and epidemiological information. The expected result is Negative.  Fact Sheet for Patients: 03/16/20  Fact Sheet for Healthcare Providers: HairSlick.no  This test is not yet approved or cleared by the quierodirigir.com FDA and  has been authorized for detection and/or diagnosis of SARS-CoV-2 by FDA under an Emergency Use Authorization (EUA). This EUA will remain  in effect (meaning this test can be used) for the duration of the COVID-19 declaration under Se ction 564(b)(1) of the Act, 21 U.S.C. section 360bbb-3(b)(1), unless the authorization is terminated or revoked sooner.  Performed at [facility], [address], [place], [place]      Radiological Exams on Admission: CT Angio Head W/Cm &/Or Wo Cm  Result Date: 03/20/2020 CLINICAL DATA:   Acute neuro deficit with right-sided weakness. EXAM: CT ANGIOGRAPHY HEAD AND NECK TECHNIQUE: Multidetector CT imaging of the head and neck was performed using the standard protocol during bolus administration of intravenous contrast. Multiplanar CT image reconstructions and MIPs were obtained to evaluate the vascular anatomy. Carotid stenosis  measurements (when applicable) are obtained utilizing NASCET criteria, using the distal internal carotid diameter as the denominator. CONTRAST:  68mL OMNIPAQUE IOHEXOL 350 MG/ML SOLN COMPARISON:  None. FINDINGS: CT HEAD FINDINGS Brain: Large mass lesion in the left frontal lobe. Delayed imaging reveals irregular enhancement of the periphery with central necrosis. The enhancing component of the mass measures approximately 53 x 28 mm. Surrounding low-density edema/tumor seen around the enhancement. Low-density extends into the left basal ganglia. There is compression of the left lateral ventricle and 7 mm midline shift to the right. No acute hemorrhage. Negative for hydrocephalus Vascular: Negative for hyperdense vessel Skull: Negative Sinuses: Complete opacification right maxillary sinus with bony thickening. Mucosal edema extends into the right frontal and right ethmoid sinus. Remaining sinuses clear. Mastoid clear bilaterally. Orbits: Negative Review of the MIP images confirms the above findings CTA NECK FINDINGS Aortic arch: Normal aortic arch. Azygos branching pattern. Proximal great vessels widely patent. Right carotid system: Right carotid widely patent. Mild noncalcified plaque in the right carotid bulb. Left carotid system: Left carotid widely patent. No significant stenosis or atherosclerotic disease. Vertebral arteries: Both vertebral arteries are widely patent to the basilar without stenosis. Skeleton: Disc degeneration and spurring C5-6 and C6-7. No acute skeletal abnormality. Poor dentition. Other neck: Negative for mass or adenopathy. Upper chest: Lung apices clear  bilaterally Review of the MIP images confirms the above findings CTA HEAD FINDINGS Anterior circulation: Mild atherosclerotic calcification in the cavernous carotid without stenosis. Right middle cerebral artery widely patent. Hypoplastic right A1 segment. Azygos anterior cerebral artery arising from the left without significant stenosis. There is displacement and stretching of the left MCA branches due to the large left frontal mass. Posterior circulation: Both vertebral arteries patent to the basilar. PICA patent bilaterally. Basilar widely patent. AICA, superior cerebellar, and posterior cerebral arteries patent bilaterally without stenosis. Venous sinuses: Normal venous enhancement Anatomic variants: None Review of the MIP images confirms the above findings IMPRESSION: 1. Large mass lesion left frontal lobe compatible with tumor. Favor glioblastoma. 2. Negative for acute infarct or hemorrhage 3. No significant carotid or vertebral artery stenosis in the neck. 4. Negative for intracranial large vessel occlusion. Electronically Signed   By: Marlan Palau M.D.   On: 03/20/2020 13:29   CT Angio Neck W and/or Wo Contrast  Result Date: 03/20/2020 CLINICAL DATA:  Acute neuro deficit with right-sided weakness. EXAM: CT ANGIOGRAPHY HEAD AND NECK TECHNIQUE: Multidetector CT imaging of the head and neck was performed using the standard protocol during bolus administration of intravenous contrast. Multiplanar CT image reconstructions and MIPs were obtained to evaluate the vascular anatomy. Carotid stenosis measurements (when applicable) are obtained utilizing NASCET criteria, using the distal internal carotid diameter as the denominator. CONTRAST:  70mL OMNIPAQUE IOHEXOL 350 MG/ML SOLN COMPARISON:  None. FINDINGS: CT HEAD FINDINGS Brain: Large mass lesion in the left frontal lobe. Delayed imaging reveals irregular enhancement of the periphery with central necrosis. The enhancing component of the mass measures  approximately 53 x 28 mm. Surrounding low-density edema/tumor seen around the enhancement. Low-density extends into the left basal ganglia. There is compression of the left lateral ventricle and 7 mm midline shift to the right. No acute hemorrhage. Negative for hydrocephalus Vascular: Negative for hyperdense vessel Skull: Negative Sinuses: Complete opacification right maxillary sinus with bony thickening. Mucosal edema extends into the right frontal and right ethmoid sinus. Remaining sinuses clear. Mastoid clear bilaterally. Orbits: Negative Review of the MIP images confirms the above findings CTA NECK FINDINGS Aortic arch: Normal  aortic arch. Azygos branching pattern. Proximal great vessels widely patent. Right carotid system: Right carotid widely patent. Mild noncalcified plaque in the right carotid bulb. Left carotid system: Left carotid widely patent. No significant stenosis or atherosclerotic disease. Vertebral arteries: Both vertebral arteries are widely patent to the basilar without stenosis. Skeleton: Disc degeneration and spurring C5-6 and C6-7. No acute skeletal abnormality. Poor dentition. Other neck: Negative for mass or adenopathy. Upper chest: Lung apices clear bilaterally Review of the MIP images confirms the above findings CTA HEAD FINDINGS Anterior circulation: Mild atherosclerotic calcification in the cavernous carotid without stenosis. Right middle cerebral artery widely patent. Hypoplastic right A1 segment. Azygos anterior cerebral artery arising from the left without significant stenosis. There is displacement and stretching of the left MCA branches due to the large left frontal mass. Posterior circulation: Both vertebral arteries patent to the basilar. PICA patent bilaterally. Basilar widely patent. AICA, superior cerebellar, and posterior cerebral arteries patent bilaterally without stenosis. Venous sinuses: Normal venous enhancement Anatomic variants: None Review of the MIP images confirms  the above findings IMPRESSION: 1. Large mass lesion left frontal lobe compatible with tumor. Favor glioblastoma. 2. Negative for acute infarct or hemorrhage 3. No significant carotid or vertebral artery stenosis in the neck. 4. Negative for intracranial large vessel occlusion. Electronically Signed   By: Franchot Gallo M.D.   On: 03/20/2020 13:29   MR Brain W and Wo Contrast  Result Date: 03/20/2020 CLINICAL DATA:  Acute right-sided weakness. Brain tumor shown by CT angiography. EXAM: MRI HEAD WITHOUT AND WITH CONTRAST TECHNIQUE: Multiplanar, multiecho pulse sequences of the brain and surrounding structures were obtained without and with intravenous contrast. CONTRAST:  8.70mL GADAVIST GADOBUTROL 1 MMOL/ML IV SOLN COMPARISON:  CT studies earlier same day FINDINGS: Brain: Approximately 6.5 X 4.5 x 4 cm tumor with the epicenter in the left basal ganglia and radiating white matter tracts region. Central necrosis. Contrast enhancement along the margins of the necrosis. The enhancing region measures 6 x 2.4 x 3.5 cm. There is mass effect with flattening of the left lateral ventricle. Left-to-right midline shift of 6 mm. No ventricular trapping. No satellite lesion identified. No second brain mass. No evidence of ischemic stroke. Vascular: Major vessels at the base of the brain show flow. Skull and upper cervical spine: Negative Sinuses/Orbits: Ostiomeatal unit pattern on the right with opacification of the right maxillary, ethmoid and frontal sinuses. Orbits negative. Other: None IMPRESSION: 6.5 x 4.5 x 4 cm tumor with the epicenter in the left basal ganglia and radiating white matter tracts. Central necrosis. Contrast enhancement along the margins of the necrosis. The enhancing region measures 6 x 2.4 x 3.5 cm. There is mass effect with flattening of the left lateral ventricle. Left-to-right midline shift of 6 mm. No ventricular trapping. No satellite lesion identified. 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 Right-sided paranasal sinusitis with ostiomeatal unit pattern. Electronically Signed   By: Nelson Chimes M.D.   On: 03/20/2020 15:44      Assessment/Plan Active Problems:   Weakness of extremity    #1  Brain mass-patient admitted with right-sided weakness and intractable nausea and vomiting.  Recent Covid diagnosis 03/01/2020. ED physician discussed with neurosurgery and recommended to start Decadron and Keppra. This has been ordered by neurosurgery. Family requesting to speak with neurosurgeon. MRI brain-6.5 x 4.5 x 4 cm tumor with the epicenter in the left basal ganglia and radiating white matter tracts. Central necrosis. Contrast enhancement along the margins of the necrosis.  The enhancing region measures 6 x 2.4 x 3.5 cm. There is mass effect with flattening of the left lateral ventricle. Left-to-right midline shift of 6 mm. No ventricular trapping. No satellite lesion identified. 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  Right-sided paranasal sinusitis with ostiomeatal unit pattern.  #2 intractable nausea vomiting due to #1 start patient on IV fluids she is not able to eat or keep anything down.  Add Zofran.  #3 miscellaneous we will hold her home medications since she is actively throwing up.   Severity of Illness: The appropriate patient status for this patient is INPATIENT. Inpatient status is judged to be reasonable and necessary in order to provide the required intensity of service to ensure the patient's safety. The patient's presenting symptoms, physical exam findings, and initial radiographic and laboratory data in the context of their chronic comorbidities is felt to place them at high risk for further clinical deterioration. Furthermore, it is not anticipated that the patient will be medically stable for discharge from the hospital within 2 midnights of admission. The following factors support the patient  status of inpatient.   " The patient's presenting symptoms include .  Aphasia intractable nausea vomiting headache right-sided weakness " The worrisome physical exam findings include right-sided weakness " The initial radiographic and laboratory data are worrisome because of brain mass " The chronic co-morbidities include anxiety disorder   * I certify that at the point of admission it is my clinical judgment that the patient will require inpatient hospital care spanning beyond 2 midnights from the point of admission due to high intensity of service, high risk for further deterioration and high frequency of surveillance required.*  Estimated body mass index is 30.38 kg/m as calculated from the following:   Height as of 03/01/20: 5\' 7"  (1.702 m).   Weight as of 03/01/20: 88 kg.   DVT prophylaxis: Lovenox Code Status: Full code Family Communication: Discussed with daughter Disposition Plan: Pending work-up and improvement Consults called: Neurosurgery Admission status: Inpatient   Georgette Shell MD [facility]  If 7PM-7AM, please contact night-coverage [email or web address] Password [facility]  03/20/2020, 5:44 PM

## 2020-03-20 NOTE — Plan of Care (Signed)

## 2020-03-20 NOTE — ED Notes (Signed)
Pt does have some slight right side facial drop

## 2020-03-20 NOTE — ED Triage Notes (Signed)
Pt here from home with c/o right sided weakness and facial drop problems started Monday and family noticed symptoms today , pt was covid positive 2 weeks ago

## 2020-03-20 NOTE — ED Notes (Signed)
Pt arrives to [facility], vomited significant amount of emesis shortly after arrival. Pt cleaned up, placed in fresh gown and linens changed. Placed in hospital bed for comfort, zofran to be admin as ordered.

## 2020-03-20 NOTE — ED Provider Notes (Signed)
[facility] EMERGENCY DEPARTMENT Provider Note   CSN: [id number] Arrival date & time: 03/20/20  7408     History No chief complaint on file.   Vickie Alvarado is a 54 y.o. female.  The history is provided by the patient, the EMS personnel and medical records. No language interpreter was used.   Vickie Alvarado is a 53 y.o. female who presents to the Emergency Department complaining of possible stroke. She presents the emergency department by EMS for stroke symptoms. She was last known at her baseline on Monday. She has been isolating in a room at home away from family members due to COVID-19 diagnosis that she received two weeks ago. Today when family checked on her they noted that she was altered and not at her baseline with right-sided facial droop and right-sided weakness. Patient reports feeling unwell. Level V caveat due to confusion.    Past Medical History:  Diagnosis Date  . Anxiety   . Bipolar disorder ([place])   . Dental caries    periodontal disease, exostosis left maxilla  . GERD (gastroesophageal reflux disease)   . Hypercholesterolemia   . Hypertension   . Seasonal allergies   . Wears dentures   . Wears glasses     Patient Active Problem List   Diagnosis Date Noted  . Weakness of extremity 03/20/2020    Past Surgical History:  Procedure Laterality Date  . COLONOSCOPY    . DENTAL SURGERY     teeth extractions  . TUBAL LIGATION    . UTERINE FIBROID SURGERY       OB History   No obstetric history on file.     No family history on file.  Social History   Tobacco Use  . Smoking status: Current Every Day Smoker    Packs/day: 0.25    Types: Cigarettes  . Smokeless tobacco: Never Used  Vaping Use  . Vaping Use: Never used  Substance Use Topics  . Alcohol use: Never  . Drug use: Never    Home Medications Prior to Admission medications   Medication Sig Start Date End Date Taking? Authorizing Provider  acetaminophen (TYLENOL)  500 MG tablet Take 1,000 mg by mouth every 6 (six) hours as needed for moderate pain or headache.    [provider]  amoxicillin (AMOXIL) 500 MG capsule Take 500 mg by mouth daily.     [provider]  atorvastatin (LIPITOR) 20 MG tablet Take 20 mg by mouth daily. 02/17/20   [provider]  benztropine (COGENTIN) 1 MG tablet Take 1 mg by mouth at bedtime.  01/30/20   [provider]  cyclobenzaprine (FLEXERIL) 10 MG tablet Take 1 tablet (10 mg total) by mouth 2 (two) times daily as needed for muscle spasms. Patient not taking: Reported on 02/22/2020 09/25/19   Albrizze, Verline Lema E, PA-C  DULoxetine (CYMBALTA) 30 MG capsule Take 30 mg by mouth daily. 01/30/20   [provider]  gabapentin (NEURONTIN) 400 MG capsule Take 400 mg by mouth 3 (three) times daily.    [provider]  levocetirizine (XYZAL) 5 MG tablet Take 5 mg by mouth daily. 02/10/20   [provider]  lisinopril-hydrochlorothiazide (ZESTORETIC) 10-12.5 MG tablet Take 1 tablet by mouth daily. 02/17/20   [provider]  omeprazole (PRILOSEC) 20 MG capsule Take 20 mg by mouth daily.    [provider]  QUEtiapine (SEROQUEL) 25 MG tablet Take 25 mg by mouth at bedtime. Take with 800 mg to  equal 825 mg at bedtime 01/30/20   [provider]  QUEtiapine (SEROQUEL) 400 MG tablet Take 800 mg by mouth at bedtime. Take with the 25 mg to equal to 825 at bedtime 01/30/20   [provider]    Allergies    Ibuprofen  Review of Systems   Review of Systems  All other systems reviewed and are negative.   Physical Exam Updated Vital Signs BP 130/72   Pulse 70   Temp 98.6 F (37 C) (Oral)   Resp 20   SpO2 97%   Physical Exam Vitals and nursing note reviewed.  Constitutional:      Appearance: She is well-developed.  HENT:     Head: Normocephalic and atraumatic.  Cardiovascular:     Rate and Rhythm: Normal rate and regular rhythm.     Heart sounds:  No murmur heard.   Pulmonary:     Effort: Pulmonary effort is normal. No respiratory distress.     Breath sounds: Normal breath sounds.  Abdominal:     Palpations: Abdomen is soft.     Tenderness: There is no abdominal tenderness. There is no guarding or rebound.  Musculoskeletal:        General: No tenderness.  Skin:    General: Skin is warm and dry.  Neurological:     Mental Status: She is alert.     Comments: Oriented to person. Cannot name location. Right-sided facial droop. Tongue deviated to the right. Right-sided pronated or drift. 4/5 strength in the right upper extremity. 3/5 strength in the right lower extremity. Sensation to light touch intact in all four extremities. Inconsistent results on visual field testing. Expressive aphasia.  Psychiatric:        Behavior: Behavior normal.     ED Results / Procedures / Treatments   Labs (all labs ordered are listed, but only abnormal results are displayed) Labs Reviewed  COMPREHENSIVE METABOLIC PANEL - Abnormal; Notable for the following components:      Result Value   Sodium 134 (*)    Chloride 97 (*)    Glucose, Bld 215 (*)    Albumin 3.2 (*)    AST 11 (*)    All other components within normal limits  URINALYSIS, ROUTINE W REFLEX MICROSCOPIC - Abnormal; Notable for the following components:   Color, Urine AMBER (*)    APPearance CLOUDY (*)    Specific Gravity, Urine >1.046 (*)    Glucose, UA 50 (*)    Protein, ur 100 (*)    Leukocytes,Ua LARGE (*)    Bacteria, UA MANY (*)    Trichomonas, UA PRESENT (*)    All other components within normal limits  CBG MONITORING, ED - Abnormal; Notable for the following components:   Glucose-Capillary 220 (*)    All other components within normal limits  ETHANOL  PROTIME-INR  APTT  CBC  DIFFERENTIAL  RAPID URINE DRUG SCREEN, HOSP PERFORMED  HIV ANTIBODY (ROUTINE TESTING W REFLEX)  CBC  CREATININE, SERUM  I-STAT CHEM 8, ED  I-STAT BETA HCG BLOOD, ED ([facility], [facility], [facility] ONLY)     EKG EKG Interpretation  Date/Time:  Wednesday March 20 2020 09:54:56 EDT Ventricular Rate:  70 PR Interval:    QRS Duration: 105 QT Interval:  417 QTC Calculation: 450 R Axis:   -8 Text Interpretation: Sinus rhythm RAE, consider biatrial enlargement Confirmed by Quintella Reichert [phone]) on 03/20/2020 10:04:08 AM Also confirmed by Quintella Reichert [phone]), editor Hattie Perch ([id number])  on 03/20/2020 11:34:36 AM  Radiology CT Angio Head W/Cm &/Or Wo Cm  Result Date: 03/20/2020 CLINICAL DATA:  Acute neuro deficit with right-sided weakness. EXAM: CT ANGIOGRAPHY HEAD AND NECK TECHNIQUE: Multidetector CT imaging of the head and neck was performed using the standard protocol during bolus administration of intravenous contrast. Multiplanar CT image reconstructions and MIPs were obtained to evaluate the vascular anatomy. Carotid stenosis measurements (when applicable) are obtained utilizing NASCET criteria, using the distal internal carotid diameter as the denominator. CONTRAST:  25mL OMNIPAQUE IOHEXOL 350 MG/ML SOLN COMPARISON:  None. FINDINGS: CT HEAD FINDINGS Brain: Large mass lesion in the left frontal lobe. Delayed imaging reveals irregular enhancement of the periphery with central necrosis. The enhancing component of the mass measures approximately 53 x 28 mm. Surrounding low-density edema/tumor seen around the enhancement. Low-density extends into the left basal ganglia. There is compression of the left lateral ventricle and 7 mm midline shift to the right. No acute hemorrhage. Negative for hydrocephalus Vascular: Negative for hyperdense vessel Skull: Negative Sinuses: Complete opacification right maxillary sinus with bony thickening. Mucosal edema extends into the right frontal and right ethmoid sinus. Remaining sinuses clear. Mastoid clear bilaterally. Orbits: Negative Review of the MIP images confirms the above findings CTA NECK FINDINGS Aortic arch: Normal aortic arch. Azygos branching  pattern. Proximal great vessels widely patent. Right carotid system: Right carotid widely patent. Mild noncalcified plaque in the right carotid bulb. Left carotid system: Left carotid widely patent. No significant stenosis or atherosclerotic disease. Vertebral arteries: Both vertebral arteries are widely patent to the basilar without stenosis. Skeleton: Disc degeneration and spurring C5-6 and C6-7. No acute skeletal abnormality. Poor dentition. Other neck: Negative for mass or adenopathy. Upper chest: Lung apices clear bilaterally Review of the MIP images confirms the above findings CTA HEAD FINDINGS Anterior circulation: Mild atherosclerotic calcification in the cavernous carotid without stenosis. Right middle cerebral artery widely patent. Hypoplastic right A1 segment. Azygos anterior cerebral artery arising from the left without significant stenosis. There is displacement and stretching of the left MCA branches due to the large left frontal mass. Posterior circulation: Both vertebral arteries patent to the basilar. PICA patent bilaterally. Basilar widely patent. AICA, superior cerebellar, and posterior cerebral arteries patent bilaterally without stenosis. Venous sinuses: Normal venous enhancement Anatomic variants: None Review of the MIP images confirms the above findings IMPRESSION: 1. Large mass lesion left frontal lobe compatible with tumor. Favor glioblastoma. 2. Negative for acute infarct or hemorrhage 3. No significant carotid or vertebral artery stenosis in the neck. 4. Negative for intracranial large vessel occlusion. Electronically Signed   By: Marlan Palau M.D.   On: 03/20/2020 13:29   CT Angio Neck W and/or Wo Contrast  Result Date: 03/20/2020 CLINICAL DATA:  Acute neuro deficit with right-sided weakness. EXAM: CT ANGIOGRAPHY HEAD AND NECK TECHNIQUE: Multidetector CT imaging of the head and neck was performed using the standard protocol during bolus administration of intravenous contrast.  Multiplanar CT image reconstructions and MIPs were obtained to evaluate the vascular anatomy. Carotid stenosis measurements (when applicable) are obtained utilizing NASCET criteria, using the distal internal carotid diameter as the denominator. CONTRAST:  36mL OMNIPAQUE IOHEXOL 350 MG/ML SOLN COMPARISON:  None. FINDINGS: CT HEAD FINDINGS Brain: Large mass lesion in the left frontal lobe. Delayed imaging reveals irregular enhancement of the periphery with central necrosis. The enhancing component of the mass measures approximately 53 x 28 mm. Surrounding low-density edema/tumor seen around the enhancement. Low-density extends into the left basal ganglia. There is compression of the left lateral ventricle and  7 mm midline shift to the right. No acute hemorrhage. Negative for hydrocephalus Vascular: Negative for hyperdense vessel Skull: Negative Sinuses: Complete opacification right maxillary sinus with bony thickening. Mucosal edema extends into the right frontal and right ethmoid sinus. Remaining sinuses clear. Mastoid clear bilaterally. Orbits: Negative Review of the MIP images confirms the above findings CTA NECK FINDINGS Aortic arch: Normal aortic arch. Azygos branching pattern. Proximal great vessels widely patent. Right carotid system: Right carotid widely patent. Mild noncalcified plaque in the right carotid bulb. Left carotid system: Left carotid widely patent. No significant stenosis or atherosclerotic disease. Vertebral arteries: Both vertebral arteries are widely patent to the basilar without stenosis. Skeleton: Disc degeneration and spurring C5-6 and C6-7. No acute skeletal abnormality. Poor dentition. Other neck: Negative for mass or adenopathy. Upper chest: Lung apices clear bilaterally Review of the MIP images confirms the above findings CTA HEAD FINDINGS Anterior circulation: Mild atherosclerotic calcification in the cavernous carotid without stenosis. Right middle cerebral artery widely patent.  Hypoplastic right A1 segment. Azygos anterior cerebral artery arising from the left without significant stenosis. There is displacement and stretching of the left MCA branches due to the large left frontal mass. Posterior circulation: Both vertebral arteries patent to the basilar. PICA patent bilaterally. Basilar widely patent. AICA, superior cerebellar, and posterior cerebral arteries patent bilaterally without stenosis. Venous sinuses: Normal venous enhancement Anatomic variants: None Review of the MIP images confirms the above findings IMPRESSION: 1. Large mass lesion left frontal lobe compatible with tumor. Favor glioblastoma. 2. Negative for acute infarct or hemorrhage 3. No significant carotid or vertebral artery stenosis in the neck. 4. Negative for intracranial large vessel occlusion. Electronically Signed   By: Franchot Gallo M.D.   On: 03/20/2020 13:29   MR Brain W and Wo Contrast  Result Date: 03/20/2020 CLINICAL DATA:  Acute right-sided weakness. Brain tumor shown by CT angiography. EXAM: MRI HEAD WITHOUT AND WITH CONTRAST TECHNIQUE: Multiplanar, multiecho pulse sequences of the brain and surrounding structures were obtained without and with intravenous contrast. CONTRAST:  8.11mL GADAVIST GADOBUTROL 1 MMOL/ML IV SOLN COMPARISON:  CT studies earlier same day FINDINGS: Brain: Approximately 6.5 X 4.5 x 4 cm tumor with the epicenter in the left basal ganglia and radiating white matter tracts region. Central necrosis. Contrast enhancement along the margins of the necrosis. The enhancing region measures 6 x 2.4 x 3.5 cm. There is mass effect with flattening of the left lateral ventricle. Left-to-right midline shift of 6 mm. No ventricular trapping. No satellite lesion identified. No second brain mass. No evidence of ischemic stroke. Vascular: Major vessels at the base of the brain show flow. Skull and upper cervical spine: Negative Sinuses/Orbits: Ostiomeatal unit pattern on the right with opacification of  the right maxillary, ethmoid and frontal sinuses. Orbits negative. Other: None IMPRESSION: 6.5 x 4.5 x 4 cm tumor with the epicenter in the left basal ganglia and radiating white matter tracts. Central necrosis. Contrast enhancement along the margins of the necrosis. The enhancing region measures 6 x 2.4 x 3.5 cm. There is mass effect with flattening of the left lateral ventricle. Left-to-right midline shift of 6 mm. No ventricular trapping. No satellite lesion identified. 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 Right-sided paranasal sinusitis with ostiomeatal unit pattern. Electronically Signed   By: Nelson Chimes M.D.   On: 03/20/2020 15:44    Procedures Procedures (including critical care time)  Medications Ordered in ED Medications  dexamethasone (DECADRON) injection 4 mg (has no administration in time  range)  levETIRAcetam (KEPPRA) IVPB 500 mg/100 mL premix (has no administration in time range)  famotidine (PEPCID) IVPB 20 mg premix (20 mg Intravenous New Bag/Given 03/20/20 1707)  enoxaparin (LOVENOX) injection 40 mg (has no administration in time range)  iohexol (OMNIPAQUE) 350 MG/ML injection 50 mL (50 mLs Intravenous Contrast Given 03/20/20 1310)  sodium chloride 0.9 % bolus 500 mL (0 mLs Intravenous Stopped 03/20/20 1546)  gadobutrol (GADAVIST) 1 MMOL/ML injection 8.8 mL (8.8 mLs Intravenous Contrast Given 03/20/20 1526)  dexamethasone (DECADRON) injection 10 mg (10 mg Intravenous Given 03/20/20 1707)    ED Course  I have reviewed the triage vital signs and the nursing notes.  Pertinent labs & imaging results that were available during my care of the patient were reviewed by me and considered in my medical decision making (see chart for details).    MDM Rules/Calculators/A&P                         Patient here for evaluation of right sided weakness, last known at baseline on Monday. Initial concern for possible CVA and a CTA head and neck was  obtained. CTA demonstrates large mass. MRI obtained, which is concerning for glioblastoma. Discussed with Dr. Maisie Fus with neurosurgery, who will see the patient and consult. He recommends admission to the medicine service. Medicine consulted for admission. Patient and daughter updated of findings of studies and recommendation for admission and they are in agreement with treatment plan. Final Clinical Impression(s) / ED Diagnoses Final diagnoses:  Brain mass    Rx / DC Orders ED Discharge Orders    None       Tilden Fossa, MD 03/20/20 1719

## 2020-03-20 NOTE — ED Notes (Signed)
Report given to floor, CT scan then floor.

## 2020-03-21 ENCOUNTER — Inpatient Hospital Stay (HOSPITAL_COMMUNITY): Payer: Medicare HMO

## 2020-03-21 ENCOUNTER — Encounter (HOSPITAL_COMMUNITY): Payer: Self-pay | Admitting: Internal Medicine

## 2020-03-21 DIAGNOSIS — F419 Anxiety disorder, unspecified: Secondary | ICD-10-CM

## 2020-03-21 DIAGNOSIS — E785 Hyperlipidemia, unspecified: Secondary | ICD-10-CM

## 2020-03-21 DIAGNOSIS — F319 Bipolar disorder, unspecified: Secondary | ICD-10-CM | POA: Diagnosis present

## 2020-03-21 DIAGNOSIS — I10 Essential (primary) hypertension: Secondary | ICD-10-CM

## 2020-03-21 LAB — CBC
HCT: 37.4 % (ref 36.0–46.0)
Hemoglobin: 12.7 g/dL (ref 12.0–15.0)
MCH: 29.4 pg (ref 26.0–34.0)
MCHC: 34 g/dL (ref 30.0–36.0)
MCV: 86.6 fL (ref 80.0–100.0)
Platelets: 338 10*3/uL (ref 150–400)
RBC: 4.32 MIL/uL (ref 3.87–5.11)
RDW: 13.7 % (ref 11.5–15.5)
WBC: 12 10*3/uL — ABNORMAL HIGH (ref 4.0–10.5)
nRBC: 0 % (ref 0.0–0.2)

## 2020-03-21 LAB — COMPREHENSIVE METABOLIC PANEL
ALT: 12 U/L (ref 0–44)
AST: 9 U/L — ABNORMAL LOW (ref 15–41)
Albumin: 3 g/dL — ABNORMAL LOW (ref 3.5–5.0)
Alkaline Phosphatase: 93 U/L (ref 38–126)
Anion gap: 14 (ref 5–15)
BUN: 11 mg/dL (ref 6–20)
CO2: 20 mmol/L — ABNORMAL LOW (ref 22–32)
Calcium: 9 mg/dL (ref 8.9–10.3)
Chloride: 101 mmol/L (ref 98–111)
Creatinine, Ser: 0.56 mg/dL (ref 0.44–1.00)
GFR calc Af Amer: >60 mL/min (ref >60)
GFR calc non Af Amer: >60 mL/min (ref >60)
Glucose, Bld: 162 mg/dL — ABNORMAL HIGH (ref 70–99)
Potassium: 3.7 mmol/L (ref 3.5–5.1)
Sodium: 135 mmol/L (ref 135–145)
Total Bilirubin: 0.7 mg/dL (ref 0.3–1.2)
Total Protein: 6.7 g/dL (ref 6.5–8.1)

## 2020-03-21 LAB — HIV ANTIBODY (ROUTINE TESTING W REFLEX): HIV Screen 4th Generation wRfx: NONREACTIVE

## 2020-03-21 IMAGING — CT CT HEAD W/O CM
1 of 2 series · 16 of 30 positions shown, 20 images · non-contrast
Comparison: Brain MRI from yesterday

CLINICAL DATA: Brain mass.

EXAM:
CT HEAD WITHOUT CONTRAST
TECHNIQUE: Contiguous axial images were obtained from the base of the skull
through the vertex without intravenous contrast.

[Series 4: head 2.0 h70h · axial · 0.42mm/px · z∈[-96,+38]mm · 16 of 75 slices shown, 20 images]
[im 4/75  brain]
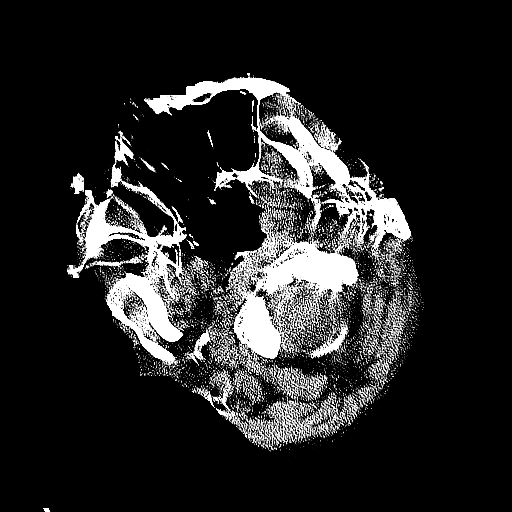
[im 4/75  bone]
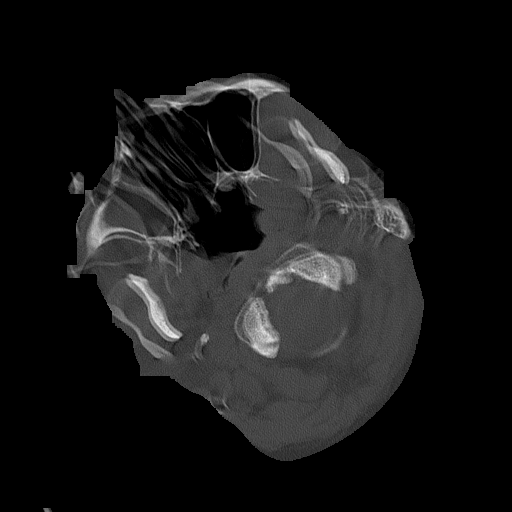
[im 8/75  brain]
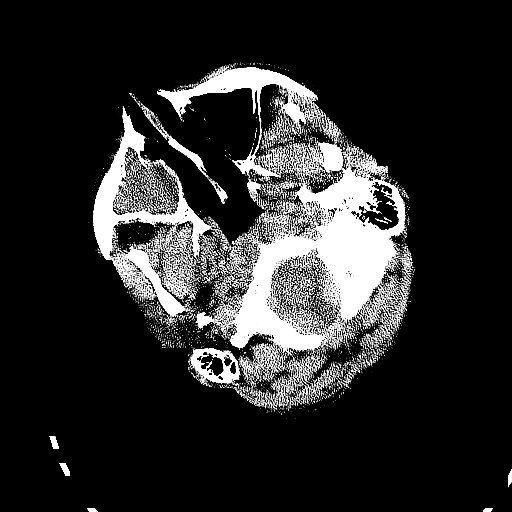
[im 12/75  brain]
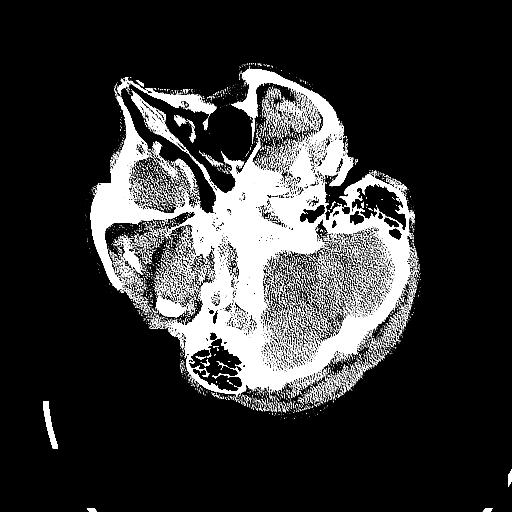
[im 19/75  brain]
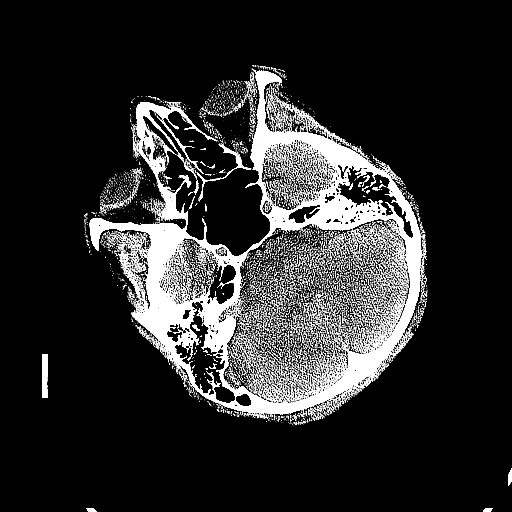
[im 23/75  brain]
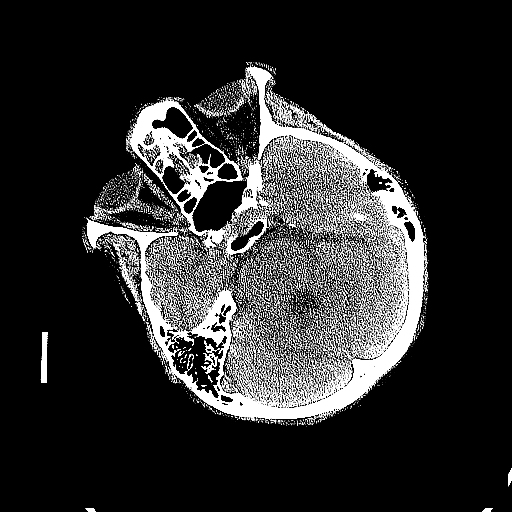
[im 23/75  bone]
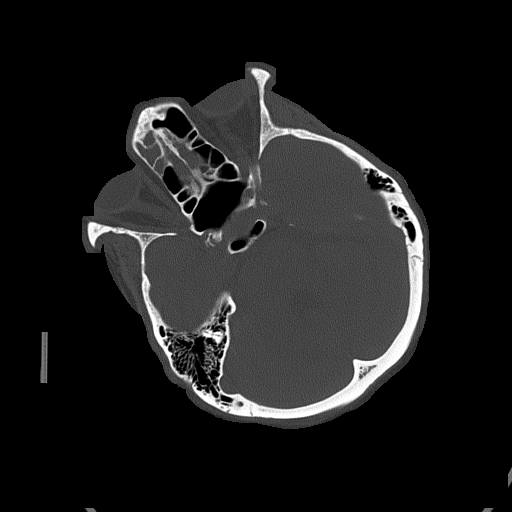
[im 26/75  brain]
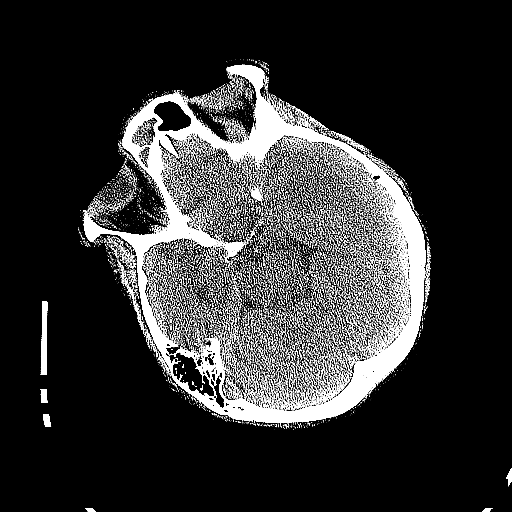
[im 30/75  brain]
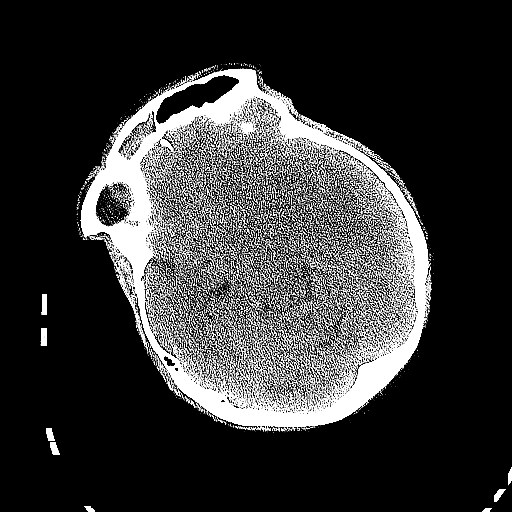
[im 34/75  brain]
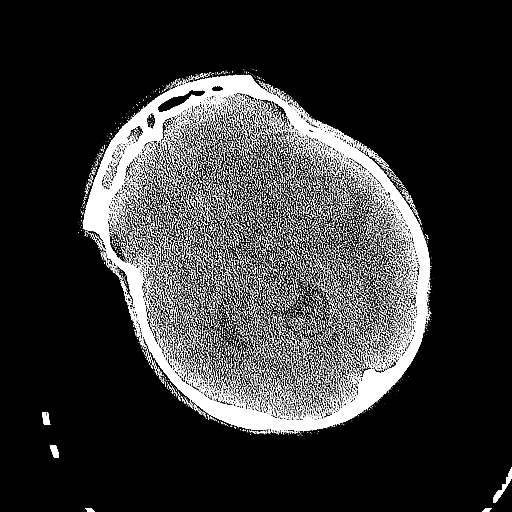
[im 41/75  brain]
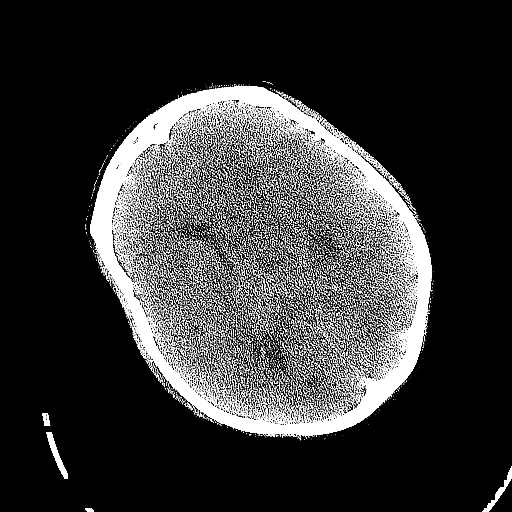
[im 41/75  bone]
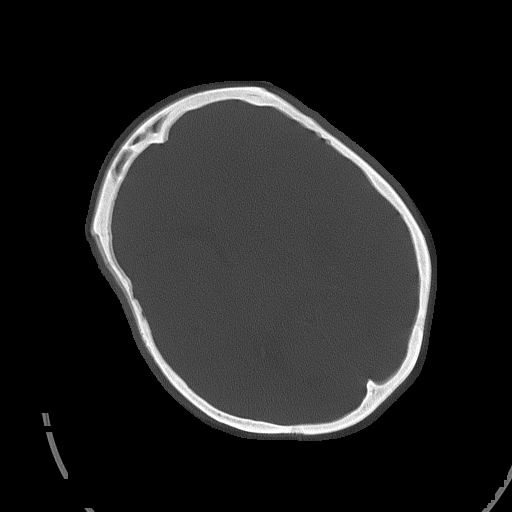
[im 45/75  brain]
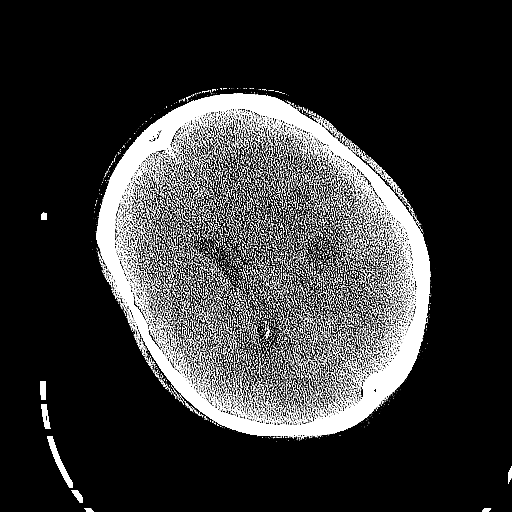
[im 49/75  brain]
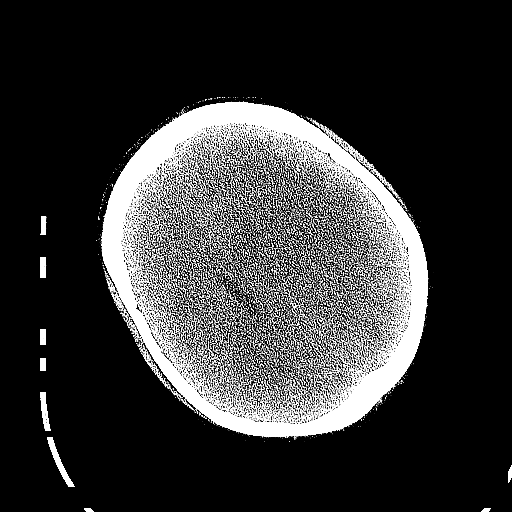
[im 52/75  brain]
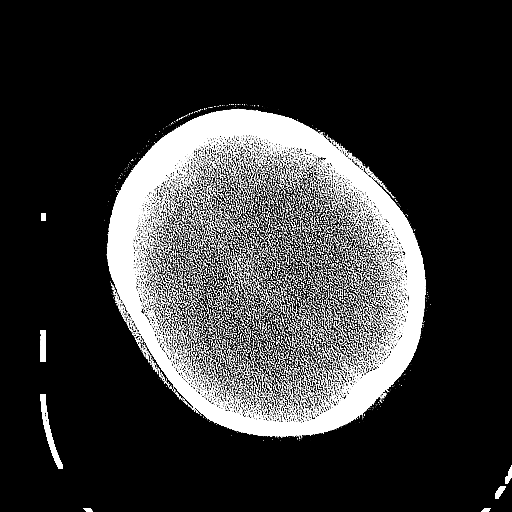
[im 56/75  brain]
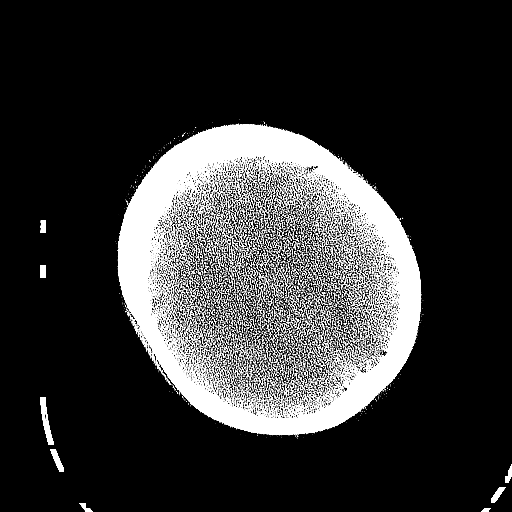
[im 56/75  bone]
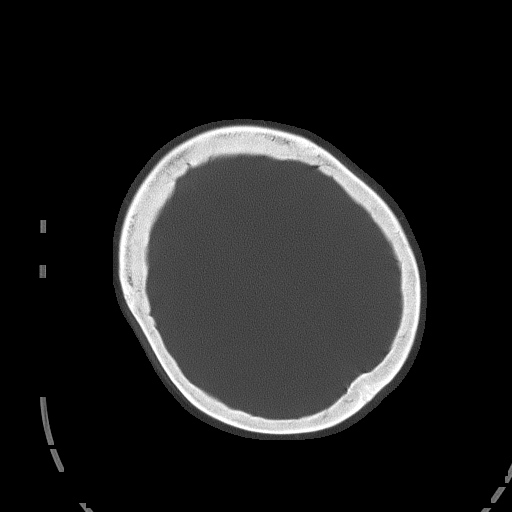
[im 63/75  brain]
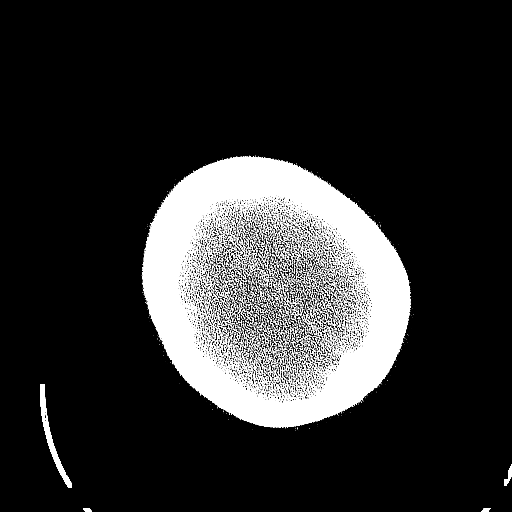
[im 67/75  brain]
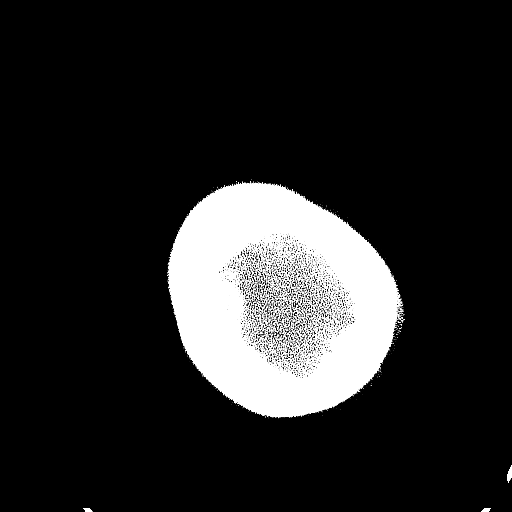
[im 71/75  brain]
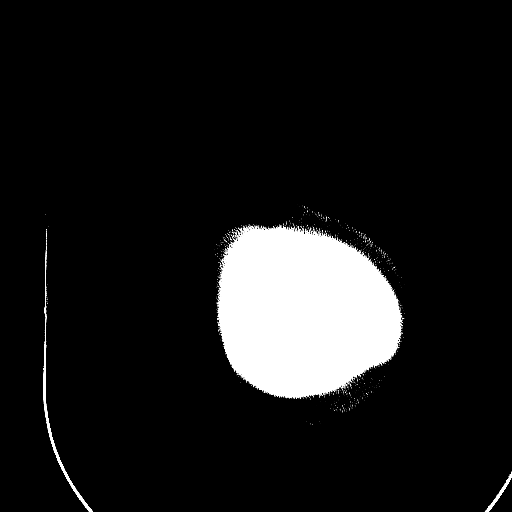

[16 of 30 positions shown; findings below may reference images not displayed]

FINDINGS: Brain: Known mass with central low-density and extensive hemispheric
low-density and swelling centered in the deep left cerebrum.
Dimensions were better assessed on postcontrast imaging from
yesterday. No interval hemorrhage or herniation. Midline shift
measures 1 cm without entrapment.

Vascular: Negative

Skull: Negative

Sinuses/Orbits: Chronic right-sided sinusitis with obstruction at
the level of the middle meatus.

Other: Motion artifact
IMPRESSION: 1. BrainLAB protocol but motion artifact at the vertex and skull
base due to confusion. If not adequate for intraoperative
localization a sedated scan may be required.
2. No new findings when compared to preceding brain MRI.

## 2020-03-21 MED ORDER — LORAZEPAM 2 MG/ML IJ SOLN
1.0000 mg | Freq: Once | INTRAMUSCULAR | Status: AC
  Administered 2020-03-21: 1 mg via INTRAVENOUS
  Filled 2020-03-21: qty 1

## 2020-03-21 MED ORDER — DEXAMETHASONE SODIUM PHOSPHATE 4 MG/ML IJ SOLN
4.0000 mg | Freq: Four times a day (QID) | INTRAMUSCULAR | Status: DC
  Administered 2020-03-21 – 2020-03-22 (×4): 4 mg via INTRAVENOUS
  Filled 2020-03-21 (×4): qty 1

## 2020-03-21 MED ORDER — HYDRALAZINE HCL 20 MG/ML IJ SOLN
10.0000 mg | Freq: Four times a day (QID) | INTRAMUSCULAR | Status: DC | PRN
  Administered 2020-03-21 – 2020-03-22 (×3): 10 mg via INTRAVENOUS
  Filled 2020-03-21 (×2): qty 1

## 2020-03-21 MED ORDER — LORAZEPAM 2 MG/ML IJ SOLN
2.0000 mg | Freq: Once | INTRAMUSCULAR | Status: AC
  Administered 2020-03-22: 2 mg via INTRAVENOUS
  Filled 2020-03-21: qty 1

## 2020-03-21 NOTE — Progress Notes (Addendum)
PROGRESS NOTE  Vickie Alvarado ALP:[id number] DOB: Sep 25, 1965 DOA: 03/20/2020 PCP: [facility]   LOS: 1 day   Brief narrative: As per HPI,  Vickie Alvarado is a 54 y.o. female with past medical history of hypertension, hyperlipidemia, GERD, bipolar disorder and recent Covid-19 infection diagnosed 03/01/2020 was isolating herself from the rest of the family members.  She lives at home with 2 of her sons.  Family checked in on her on the day of admission and found that she was altered with right-sided weakness, right facial droop and dysarthria.  Patient had been having decreased p.o. intake, decreased appetite with projectile vomiting nausea and right-sided weakness. History was obtained from the patient's daughter and ER records.  Patient was aphasic on presentation.  In the ED, Sodium 134 potassium 3.6 BUN 11 creatinine 0.44 white count 9.5.  Initially code stroke was initiated.  MRI of the brain showed 6.5 x 4.5 x 4 cm tumor in the left basal ganglia.  She was then admitted to the hospital for further evaluation and treatment.  Assessment/Plan:   Principal Problem:   Brain mass Active Problems:   Weakness of extremity   Essential hypertension   Hyperlipidemia   Anxiety   Bipolar disorder (HCC)   Brain mass- MRI brain was performed in the ED due to focal weakness which showed 6.5 x 4.5 x 4 cm lesion with the epicenter in the left basal ganglia and radiating white matter tracts. Central necrosis. Contrast enhancement along the margins of the necrosis. The enhancing region measures 6 x 2.4 x 3.5 cm. There is mass effect with flattening of the left lateral ventricle. Left-to-right midline shift of 6 mm.  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  Neurosurgery was consulted.  Initially received Decadron which has been discontinued.  There is possibility of brain abscess as well.  Follow blood cultures.  Mild leukocytosis noted.  On Keppra  empirically.  T-max of 99.4 F.  HIV has been sent. Will communicate with the neurosurgical team today.  It looks like MRI with contrast has been requested again.  Will need disposition swallow evaluation prior to initiating oral.  Continue on IV fluids.  Keep n.p.o. for now.  Right-sided paranasal sinusitis with ostiomeatal unit pattern.  Continue broad-spectrum antibiotic at this time.  Intractable nausea vomiting due to brain mass.  Continue IV fluids antiemetics.  Urine pregnancy test was negative.  Nausea and vomiting has improved  Cannabinoid use disorder.  UDS positive for THC.  Abnormal urinalysis.  Sent urine for culture.  Essential hypertension.  Blood pressure is elevated at this time.  Add hydralazine as needed.  History of hyperlipidemia.  Not on any medications at home.  History of bipolar disorder.  On Seroquel, Cogentin at home.  Hold off for now due to altered mental status.   DVT prophylaxis: enoxaparin (LOVENOX) injection 40 mg Start: 03/20/20 1800 SCDs Start: 03/20/20 1640    Code Status: Full code  Family Communication: Spoke with the patient's daughter on the phone and niece at bedside.  Status is: Inpatient  Remains inpatient appropriate because:Unsafe d/c plan, Inpatient level of care appropriate due to severity of illness and Neurosurgery input, possible need for biopsy   Dispo: The patient is from: Home              Anticipated d/c is to: Home              Anticipated d/c date is: 3 days  Patient currently is not medically stable to d/c.  Consultants:  Neurosurgery  Procedures:  None so far  Antibiotics:  . None  Anti-infectives (From admission, onward)   None     Subjective: Today, patient was seen and examined at bedside.  Patient is slightly agitated at times, noncommunicative, appears somnolent  Objective: Vitals:   03/21/20 0403 03/21/20 0715  BP: (!) 155/79 (!) 174/80  Pulse: 65 63  Resp: 20 17  Temp: 98.6 F  (37 C) 98.4 F (36.9 C)  SpO2: 100% 100%    Intake/Output Summary (Last 24 hours) at 03/21/2020 0817 Last data filed at 14-Apr-2020 1737 Gross per 24 hour  Intake 50 ml  Output --  Net 50 ml   There were no vitals filed for this visit. There is no height or weight on file to calculate BMI.   Physical Exam: GENERAL: Patient is somnolent, agitated at times, noncommunicative, Not in obvious distress. HENT: No scleral pallor or icterus. Pupils equally reactive to light. Oral mucosa is moist NECK: is supple, no gross swelling noted. CHEST: Clear to auscultation. No crackles or wheezes.  Diminished breath sounds bilaterally. CVS: S1 and S2 heard, no murmur. Regular rate and rhythm.  ABDOMEN: Soft, non-tender, bowel sounds are present. EXTREMITIES: No edema. CNS: Right facial palsy, 2/ 5 right upper extremity and 3/ 5 right lower extremity weakness, somnolent SKIN: warm and dry without rashes.  Data Review: I have personally reviewed the following laboratory data and studies,   CBC: Recent Labs  Lab 2020-04-14 1011 03/21/20 0616  WBC 9.5 12.0*  NEUTROABS 6.7  --   HGB 12.5 12.7  HCT 37.6 37.4  MCV 88.1 86.6  PLT 360 063   Basic Metabolic Panel: Recent Labs  Lab April 14, 2020 1011 03/21/20 0616  NA 134* 135  K 3.6 3.7  CL 97* 101  CO2 24 20*  GLUCOSE 215* 162*  BUN 11 11  CREATININE 0.44 0.56  CALCIUM 9.3 9.0   Liver Function Tests: Recent Labs  Lab 04-14-2020 1011 03/21/20 0616  AST 11* 9*  ALT 14 12  ALKPHOS 103 93  BILITOT 0.6 0.7  PROT 7.2 6.7  ALBUMIN 3.2* 3.0*   No results for input(s): LIPASE, AMYLASE in the last 168 hours. No results for input(s): AMMONIA in the last 168 hours. Cardiac Enzymes: No results for input(s): CKTOTAL, CKMB, CKMBINDEX, TROPONINI in the last 168 hours. BNP (last 3 results) No results for input(s): BNP in the last 8760 hours.  ProBNP (last 3 results) No results for input(s): PROBNP in the last 8760 hours.  CBG: Recent Labs   Lab April 14, 2020 1006  GLUCAP 220*   Recent Results (from the past 240 hour(s))  SARS CORONAVIRUS 2 (TAT 6-24 HRS) Nasopharyngeal Nasopharyngeal Swab     Status: None   Collection Time: 03/14/20  2:49 PM   Specimen: Nasopharyngeal Swab  Result Value Ref Range Status   SARS Coronavirus 2 NEGATIVE NEGATIVE Final    Comment: (NOTE) SARS-CoV-2 target nucleic acids are NOT DETECTED.  The SARS-CoV-2 RNA is generally detectable in upper and lower respiratory specimens during the acute phase of infection. Negative results do not preclude SARS-CoV-2 infection, do not rule out co-infections with other pathogens, and should not be used as the sole basis for treatment or other patient management decisions. Negative results must be combined with clinical observations, patient history, and epidemiological information. The expected result is Negative.  Fact Sheet for Patients: SugarRoll.be  Fact Sheet for Healthcare Providers: https://www.woods-mathews.com/  This test  is not yet approved or cleared by the [place] and  has been authorized for detection and/or diagnosis of SARS-CoV-2 by FDA under an Emergency Use Authorization (EUA). This EUA will remain  in effect (meaning this test can be used) for the duration of the COVID-19 declaration under Se ction 564(b)(1) of the Act, 21 U.S.C. section 360bbb-3(b)(1), unless the authorization is terminated or revoked sooner.  Performed at [facility], [place] [address] [place], [place] [address]      Studies: CT Angio Head W/Cm &/Or Wo Cm  Result Date: 03/20/2020 CLINICAL DATA:  Acute neuro deficit with right-sided weakness. EXAM: CT ANGIOGRAPHY HEAD AND NECK TECHNIQUE: Multidetector CT imaging of the head and neck was performed using the standard protocol during bolus administration of intravenous contrast. Multiplanar CT image reconstructions and MIPs were obtained to evaluate the vascular  anatomy. Carotid stenosis measurements (when applicable) are obtained utilizing NASCET criteria, using the distal internal carotid diameter as the denominator. CONTRAST:  35mL OMNIPAQUE IOHEXOL 350 MG/ML SOLN COMPARISON:  None. FINDINGS: CT HEAD FINDINGS Brain: Large mass lesion in the left frontal lobe. Delayed imaging reveals irregular enhancement of the periphery with central necrosis. The enhancing component of the mass measures approximately 53 x 28 mm. Surrounding low-density edema/tumor seen around the enhancement. Low-density extends into the left basal ganglia. There is compression of the left lateral ventricle and 7 mm midline shift to the right. No acute hemorrhage. Negative for hydrocephalus Vascular: Negative for hyperdense vessel Skull: Negative Sinuses: Complete opacification right maxillary sinus with bony thickening. Mucosal edema extends into the right frontal and right ethmoid sinus. Remaining sinuses clear. Mastoid clear bilaterally. Orbits: Negative Review of the MIP images confirms the above findings CTA NECK FINDINGS Aortic arch: Normal aortic arch. Azygos branching pattern. Proximal great vessels widely patent. Right carotid system: Right carotid widely patent. Mild noncalcified plaque in the right carotid bulb. Left carotid system: Left carotid widely patent. No significant stenosis or atherosclerotic disease. Vertebral arteries: Both vertebral arteries are widely patent to the basilar without stenosis. Skeleton: Disc degeneration and spurring C5-6 and C6-7. No acute skeletal abnormality. Poor dentition. Other neck: Negative for mass or adenopathy. Upper chest: Lung apices clear bilaterally Review of the MIP images confirms the above findings CTA HEAD FINDINGS Anterior circulation: Mild atherosclerotic calcification in the cavernous carotid without stenosis. Right middle cerebral artery widely patent. Hypoplastic right A1 segment. Azygos anterior cerebral artery arising from the left without  significant stenosis. There is displacement and stretching of the left MCA branches due to the large left frontal mass. Posterior circulation: Both vertebral arteries patent to the basilar. PICA patent bilaterally. Basilar widely patent. AICA, superior cerebellar, and posterior cerebral arteries patent bilaterally without stenosis. Venous sinuses: Normal venous enhancement Anatomic variants: None Review of the MIP images confirms the above findings IMPRESSION: 1. Large mass lesion left frontal lobe compatible with tumor. Favor glioblastoma. 2. Negative for acute infarct or hemorrhage 3. No significant carotid or vertebral artery stenosis in the neck. 4. Negative for intracranial large vessel occlusion. Electronically Signed   By: Franchot Gallo M.D.   On: 03/20/2020 13:29   CT Angio Neck W and/or Wo Contrast  Result Date: 03/20/2020 CLINICAL DATA:  Acute neuro deficit with right-sided weakness. EXAM: CT ANGIOGRAPHY HEAD AND NECK TECHNIQUE: Multidetector CT imaging of the head and neck was performed using the standard protocol during bolus administration of intravenous contrast. Multiplanar CT image reconstructions and MIPs were obtained to evaluate the vascular anatomy. Carotid  stenosis measurements (when applicable) are obtained utilizing NASCET criteria, using the distal internal carotid diameter as the denominator. CONTRAST:  48mL OMNIPAQUE IOHEXOL 350 MG/ML SOLN COMPARISON:  None. FINDINGS: CT HEAD FINDINGS Brain: Large mass lesion in the left frontal lobe. Delayed imaging reveals irregular enhancement of the periphery with central necrosis. The enhancing component of the mass measures approximately 53 x 28 mm. Surrounding low-density edema/tumor seen around the enhancement. Low-density extends into the left basal ganglia. There is compression of the left lateral ventricle and 7 mm midline shift to the right. No acute hemorrhage. Negative for hydrocephalus Vascular: Negative for hyperdense vessel Skull:  Negative Sinuses: Complete opacification right maxillary sinus with bony thickening. Mucosal edema extends into the right frontal and right ethmoid sinus. Remaining sinuses clear. Mastoid clear bilaterally. Orbits: Negative Review of the MIP images confirms the above findings CTA NECK FINDINGS Aortic arch: Normal aortic arch. Azygos branching pattern. Proximal great vessels widely patent. Right carotid system: Right carotid widely patent. Mild noncalcified plaque in the right carotid bulb. Left carotid system: Left carotid widely patent. No significant stenosis or atherosclerotic disease. Vertebral arteries: Both vertebral arteries are widely patent to the basilar without stenosis. Skeleton: Disc degeneration and spurring C5-6 and C6-7. No acute skeletal abnormality. Poor dentition. Other neck: Negative for mass or adenopathy. Upper chest: Lung apices clear bilaterally Review of the MIP images confirms the above findings CTA HEAD FINDINGS Anterior circulation: Mild atherosclerotic calcification in the cavernous carotid without stenosis. Right middle cerebral artery widely patent. Hypoplastic right A1 segment. Azygos anterior cerebral artery arising from the left without significant stenosis. There is displacement and stretching of the left MCA branches due to the large left frontal mass. Posterior circulation: Both vertebral arteries patent to the basilar. PICA patent bilaterally. Basilar widely patent. AICA, superior cerebellar, and posterior cerebral arteries patent bilaterally without stenosis. Venous sinuses: Normal venous enhancement Anatomic variants: None Review of the MIP images confirms the above findings IMPRESSION: 1. Large mass lesion left frontal lobe compatible with tumor. Favor glioblastoma. 2. Negative for acute infarct or hemorrhage 3. No significant carotid or vertebral artery stenosis in the neck. 4. Negative for intracranial large vessel occlusion. Electronically Signed   By: Marlan Palau M.D.    On: 03/20/2020 13:29   MR Brain W and Wo Contrast  Result Date: 03/20/2020 CLINICAL DATA:  Acute right-sided weakness. Brain tumor shown by CT angiography. EXAM: MRI HEAD WITHOUT AND WITH CONTRAST TECHNIQUE: Multiplanar, multiecho pulse sequences of the brain and surrounding structures were obtained without and with intravenous contrast. CONTRAST:  8.28mL GADAVIST GADOBUTROL 1 MMOL/ML IV SOLN COMPARISON:  CT studies earlier same day FINDINGS: Brain: Approximately 6.5 X 4.5 x 4 cm tumor with the epicenter in the left basal ganglia and radiating white matter tracts region. Central necrosis. Contrast enhancement along the margins of the necrosis. The enhancing region measures 6 x 2.4 x 3.5 cm. There is mass effect with flattening of the left lateral ventricle. Left-to-right midline shift of 6 mm. No ventricular trapping. No satellite lesion identified. No second brain mass. No evidence of ischemic stroke. Vascular: Major vessels at the base of the brain show flow. Skull and upper cervical spine: Negative Sinuses/Orbits: Ostiomeatal unit pattern on the right with opacification of the right maxillary, ethmoid and frontal sinuses. Orbits negative. Other: None IMPRESSION: 6.5 x 4.5 x 4 cm tumor with the epicenter in the left basal ganglia and radiating white matter tracts. Central necrosis. Contrast enhancement along the margins of the necrosis. The enhancing  region measures 6 x 2.4 x 3.5 cm. There is mass effect with flattening of the left lateral ventricle. Left-to-right midline shift of 6 mm. No ventricular trapping. No satellite lesion identified. 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 Right-sided paranasal sinusitis with ostiomeatal unit pattern. Electronically Signed   By: Paulina Fusi M.D.   On: 03/20/2020 15:44   CT CHEST ABDOMEN PELVIS W CONTRAST  Result Date: 03/20/2020 CLINICAL DATA:  54 year old female with brain mass. Evaluate for metastatic disease. EXAM: CT  CHEST, ABDOMEN, AND PELVIS WITH CONTRAST TECHNIQUE: Multidetector CT imaging of the chest, abdomen and pelvis was performed following the standard protocol during bolus administration of intravenous contrast. CONTRAST:  OMNIPAQUE IOHEXOL 350 MG/ML SOLN COMPARISON:  None. FINDINGS: CT CHEST FINDINGS Cardiovascular: There is no cardiomegaly or pericardial effusion. There is noncalcified plaque along the thoracic aorta and aortic arch. No aneurysmal dilatation or dissection. The origins of the great vessels of the aortic arch appear patent. The central pulmonary arteries are unremarkable for the degree of opacification. Mediastinum/Nodes: Top-normal right hilar lymph nodes. No adenopathy. The esophagus and the thyroid gland are grossly unremarkable. No mediastinal fluid collection. Lungs/Pleura: There is a patchy area of consolidation with air bronchograms involving the right lower lobe which may represent atelectasis but concerning for pneumonia. Aspiration is not excluded. Clinical correlation and follow-up to resolution recommended to exclude an underlying mass. Faint left lung base linear and platelike atelectasis noted. There is no pleural effusion or pneumothorax. The central airways are patent. Musculoskeletal: No acute osseous pathology. CT ABDOMEN PELVIS FINDINGS No intra-abdominal free air. There is a small free fluid in the pelvis. Hepatobiliary: The liver is unremarkable. No intrahepatic biliary ductal dilatation. The gallbladder is unremarkable. Pancreas: Unremarkable. No pancreatic ductal dilatation or surrounding inflammatory changes. Spleen: Normal in size without focal abnormality. Adrenals/Urinary Tract: The adrenal glands are unremarkable. There is no hydronephrosis on either side. There is symmetric enhancement and excretion of contrast by both kidneys. The visualized ureters and urinary bladder appear unremarkable. Stomach/Bowel: There is moderate stool throughout the colon. There is no bowel  obstruction or active inflammation. The appendix is normal. Vascular/Lymphatic: The abdominal aorta and IVC are unremarkable. No portal venous gas. There is no adenopathy. Reproductive: Hysterectomy. There is a 9 mm calcific focus in the right posterior pelvis which is not evaluated but may be associated with the ovary. Pelvic ultrasound may provide better evaluation. Other: Anterior pelvic wall surgical scar.  No fluid collection. Musculoskeletal: Degenerative changes of the spine. No acute osseous pathology. IMPRESSION: 1. Right lower lobe consolidation concerning for pneumonia. Aspiration is not excluded. Clinical correlation and follow-up to resolution recommended to exclude an underlying mass. 2. No evidence of metastatic disease in the chest, abdomen or pelvis. 3. An indeterminate 9 mm calcific focus in the right posterior pelvis may be associated with the ovary. Pelvic ultrasound may provide better evaluation. 4. Aortic Atherosclerosis (ICD10-I70.0). Electronically Signed   By: Elgie Collard M.D.   On: 03/20/2020 19:52      [name], MD  [facility] 03/21/2020

## 2020-03-21 NOTE — Progress Notes (Signed)
Pt came to MRI with sedation meds on board. She was still thrashing around her bed. We called pt's RN and were told pt only had one time med order. Pt was changed and move to MRI table which agitated her more. I tried to wait for pt to settle but it became obvious that pt was at risk for hurting herself on our table or machine.

## 2020-03-21 NOTE — Anesthesia Preprocedure Evaluation (Addendum)
Anesthesia Evaluation  Patient identified by MRN, date of birth, ID band Patient unresponsive    Reviewed: Allergy & Precautions, NPO status , Patient's Chart, lab work & pertinent test results, Unable to perform ROS - Chart review only  Airway Mallampati: II  TM Distance: >3 FB Neck ROM: Full    Dental  (+) Dental Advisory Given   Pulmonary neg pulmonary ROS, Current Smoker and Patient abstained from smoking.,    breath sounds clear to auscultation       Cardiovascular hypertension,  Rhythm:Regular Rate:Normal     Neuro/Psych PSYCHIATRIC DISORDERS Anxiety Bipolar Disorder negative neurological ROS  negative psych ROS   GI/Hepatic negative GI ROS, Neg liver ROS, GERD  ,  Endo/Other  negative endocrine ROS  Renal/GU negative Renal ROS     Musculoskeletal negative musculoskeletal ROS (+)   Abdominal   Peds  Hematology negative hematology ROS (+)   Anesthesia Other Findings   Reproductive/Obstetrics negative OB ROS                            Anesthesia Physical  Anesthesia Plan  ASA: III  Anesthesia Plan: General   Post-op Pain Management:    Induction: Intravenous  PONV Risk Score and Plan: 3 and Ondansetron, Dexamethasone and Treatment may vary due to age or medical condition  Airway Management Planned: Oral ETT  Additional Equipment: Arterial line  Intra-op Plan:   Post-operative Plan: Possible Post-op intubation/ventilation  Informed Consent: I have reviewed the patients History and Physical, chart, labs and discussed the procedure including the risks, benefits and alternatives for the proposed anesthesia with the patient or authorized representative who has indicated his/her understanding and acceptance.     Dental advisory given  Plan Discussed with: CRNA  Anesthesia Plan Comments:        Anesthesia Quick Evaluation

## 2020-03-21 NOTE — Progress Notes (Signed)
MRI notified this writer that pt in MRI and not remaining still enough to complete MRI. Notified that pt would be sent back to the floor due to not being cooperative.

## 2020-03-21 NOTE — Evaluation (Signed)
Clinical/Bedside Swallow Evaluation Patient Details  Name: Vickie Alvarado MRN: [id number] Date of Birth: 07-12-1966  Today's Date: 03/21/2020 Time: SLP Start Time (ACUTE ONLY): 1420 SLP Stop Time (ACUTE ONLY): 1436 SLP Time Calculation (min) (ACUTE ONLY): 16 min  Past Medical History:  Past Medical History:  Diagnosis Date  . Anxiety   . Bipolar disorder (HCC)   . Dental caries    periodontal disease, exostosis left maxilla  . GERD (gastroesophageal reflux disease)   . Hypercholesterolemia   . Hypertension   . Seasonal allergies   . Wears dentures   . Wears glasses    Past Surgical History:  Past Surgical History:  Procedure Laterality Date  . COLONOSCOPY    . DENTAL SURGERY     teeth extractions  . TUBAL LIGATION    . UTERINE FIBROID SURGERY     HPI:  Pt is a 54 y.o. female who presented to the ED with stroke-like symptoms. She had been isolating in a room at home away from family members due to COVID-19 diagnosis and was found with confusion, speech difficulty, right-sided facial droop and right-sided weakness. 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 Central necrosis. Neurosurgery consulted; ?intracranial abscess. Pt passed [employer] Screen but, per RN, pt subsequently exhibited coughing with thin liquids.    Assessment / Plan / Recommendation Clinical Impression  Pt was seen for bedside swallow evaluation. She exhibited difficulty following commands and did not communicate verbally. She exhibited some difficulty maintaining alertness for prolonged periods and intermittent stimulation was needed for maintenance of alertness. Her niece at bedside denied the pt having dysphagia at baseline. Oral mechanism exam was limited due to pt's difficulty following commands; however, she exhibited right-sided facial and lingual weakness. She presented with adequate dentition and white lingual coating was  noted, suggesting possible thrush; RN advised of this. Pt exhibited impaired bolus awareness with absent bolus manipulation despite verbal and tactile cues. She did not accept ice chips and exhibited blowing when cued to suck from a straw. SLP will follow closely to assess improvement in swallow function. However, short-term enteral nutrition (e.g., Cortrak) may be warranted if her impairments persist.  SLP Visit Diagnosis: Dysphagia, unspecified (R13.10)    Aspiration Risk       Diet Recommendation NPO;Alternative means - temporary   Medication Administration: Via alternative means    Other  Recommendations Oral Care Recommendations: Oral care QID;Staff/trained caregiver to provide oral care   Follow up Recommendations Other (comment) (TBD)      Frequency and Duration min 2x/week  2 weeks       Prognosis Prognosis for Safe Diet Advancement: Good Barriers to Reach Goals: Cognitive deficits      Swallow Study   General Date of Onset: 03/20/20 HPI: Pt is a 54 y.o. female who presented to the ED with stroke-like symptoms. She had been isolating in a room at home away from family members due to COVID-19 diagnosis and was found with confusion, speech difficulty, right-sided facial droop and right-sided weakness. 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 Central necrosis. Neurosurgery consulted; ?intracranial abscess. Pt passed [employer] Screen but, per RN, pt subsequently exhibited coughing with thin liquids.  Type of Study: Bedside Swallow Evaluation Previous Swallow Assessment: None Diet Prior to this Study: NPO Temperature Spikes Noted: No Respiratory Status: Room  air History of Recent Intubation: No Behavior/Cognition: Alert;Cooperative;Pleasant mood Oral Cavity Assessment: Within Functional Limits Oral Care Completed by SLP: No Oral Cavity - Dentition: Adequate natural dentition Vision: Functional  for self-feeding Self-Feeding Abilities: Able to feed self Patient Positioning: Upright in bed;Postural control adequate for testing Baseline Vocal Quality: Not observed Volitional Cough: Cognitively unable to elicit Volitional Swallow: Able to elicit    Oral/Motor/Sensory Function Overall Oral Motor/Sensory Function: Moderate impairment Facial ROM: Reduced right;Suspected CN VII (facial) dysfunction Facial Symmetry: Abnormal symmetry right;Suspected CN VII (facial) dysfunction Lingual ROM: Reduced right;Suspected CN XII (hypoglossal) dysfunction Lingual Symmetry: Abnormal symmetry right;Suspected CN XII (hypoglossal) dysfunction   Ice Chips Ice chips: Impaired Presentation: Spoon Oral Phase Impairments: Poor awareness of bolus;Reduced lingual movement/coordination   Thin Liquid Thin Liquid: Impaired Presentation: Straw Oral Phase Impairments: Poor awareness of bolus    Nectar Thick Nectar Thick Liquid: Not tested   Honey Thick Honey Thick Liquid: Not tested   Puree Puree: Impaired Presentation: Spoon Oral Phase Impairments: Reduced lingual movement/coordination;Poor awareness of bolus   Solid     Solid: Not tested     Vickie Alvarado I. Vear Clock, MS, CCC-SLP Acute Rehabilitation Services Office number [phone] Pager [phone]  Vickie Alvarado 03/21/2020,2:54 PM

## 2020-03-22 ENCOUNTER — Inpatient Hospital Stay (HOSPITAL_COMMUNITY): Payer: Medicare HMO

## 2020-03-22 ENCOUNTER — Encounter (HOSPITAL_COMMUNITY)
Admission: EM | Disposition: A | Discharge status: Skilled Nursing Facility | Payer: Self-pay | Source: Home / Self Care | Attending: Internal Medicine

## 2020-03-22 ENCOUNTER — Inpatient Hospital Stay (HOSPITAL_COMMUNITY): Payer: Medicare HMO | Admitting: Certified Registered"

## 2020-03-22 DIAGNOSIS — Z20822 Contact with and (suspected) exposure to covid-19: Secondary | ICD-10-CM | POA: Diagnosis not present

## 2020-03-22 DIAGNOSIS — G9341 Metabolic encephalopathy: Secondary | ICD-10-CM

## 2020-03-22 DIAGNOSIS — G060 Intracranial abscess and granuloma: Secondary | ICD-10-CM | POA: Diagnosis not present

## 2020-03-22 DIAGNOSIS — Z23 Encounter for immunization: Secondary | ICD-10-CM | POA: Diagnosis not present

## 2020-03-22 DIAGNOSIS — L89154 Pressure ulcer of sacral region, stage 4: Secondary | ICD-10-CM | POA: Diagnosis not present

## 2020-03-22 HISTORY — PX: APPLICATION OF CRANIAL NAVIGATION: SHX6578

## 2020-03-22 HISTORY — PX: FRAMELESS  BIOPSY WITH BRAINLAB: SHX6879

## 2020-03-22 LAB — POCT I-STAT 7, (LYTES, BLD GAS, ICA,H+H)
Acid-base deficit: 5 mmol/L — ABNORMAL HIGH (ref 0.0–2.0)
Bicarbonate: 17.3 mmol/L — ABNORMAL LOW (ref 20.0–28.0)
Calcium, Ion: 1.05 mmol/L — ABNORMAL LOW (ref 1.15–1.40)
HCT: 31 % — ABNORMAL LOW (ref 36.0–46.0)
Hemoglobin: 10.5 g/dL — ABNORMAL LOW (ref 12.0–15.0)
O2 Saturation: 99 %
Patient temperature: 36
Potassium: 2.9 mmol/L — ABNORMAL LOW (ref 3.5–5.1)
Sodium: 143 mmol/L (ref 135–145)
TCO2: 18 mmol/L — ABNORMAL LOW (ref 22–32)
pCO2 arterial: 21.5 mmHg — ABNORMAL LOW (ref 32.0–48.0)
pH, Arterial: 7.509 — ABNORMAL HIGH (ref 7.350–7.450)
pO2, Arterial: 131 mmHg — ABNORMAL HIGH (ref 83.0–108.0)

## 2020-03-22 LAB — CBC
HCT: 39 % (ref 36.0–46.0)
Hemoglobin: 13.3 g/dL (ref 12.0–15.0)
MCH: 29.3 pg (ref 26.0–34.0)
MCHC: 34.1 g/dL (ref 30.0–36.0)
MCV: 85.9 fL (ref 80.0–100.0)
Platelets: 363 10*3/uL (ref 150–400)
RBC: 4.54 MIL/uL (ref 3.87–5.11)
RDW: 14 % (ref 11.5–15.5)
WBC: 13.9 10*3/uL — ABNORMAL HIGH (ref 4.0–10.5)
nRBC: 0 % (ref 0.0–0.2)

## 2020-03-22 LAB — BASIC METABOLIC PANEL
Anion gap: 14 (ref 5–15)
BUN: 11 mg/dL (ref 6–20)
CO2: 17 mmol/L — ABNORMAL LOW (ref 22–32)
Calcium: 8.9 mg/dL (ref 8.9–10.3)
Chloride: 103 mmol/L (ref 98–111)
Creatinine, Ser: 0.44 mg/dL (ref 0.44–1.00)
GFR calc Af Amer: >60 mL/min (ref >60)
GFR calc non Af Amer: >60 mL/min (ref >60)
Glucose, Bld: 173 mg/dL — ABNORMAL HIGH (ref 70–99)
Potassium: 3.6 mmol/L (ref 3.5–5.1)
Sodium: 134 mmol/L — ABNORMAL LOW (ref 135–145)

## 2020-03-22 LAB — MRSA PCR SCREENING: MRSA by PCR: NEGATIVE

## 2020-03-22 LAB — GLUCOSE, CAPILLARY
Glucose-Capillary: 137 mg/dL — ABNORMAL HIGH (ref 70–99)
Glucose-Capillary: 185 mg/dL — ABNORMAL HIGH (ref 70–99)
Glucose-Capillary: 199 mg/dL — ABNORMAL HIGH (ref 70–99)

## 2020-03-22 LAB — URINE CULTURE: Culture: <10000 — AB

## 2020-03-22 LAB — HEMOGLOBIN A1C
Hgb A1c MFr Bld: 11.5 % — ABNORMAL HIGH (ref 4.8–5.6)
Mean Plasma Glucose: 283.35 mg/dL

## 2020-03-22 LAB — MAGNESIUM: Magnesium: 1.9 mg/dL (ref 1.7–2.4)

## 2020-03-22 IMAGING — CT CT HEAD W/O CM
2 of 3 series · 15 of 40 positions shown, 18 images · non-contrast
Comparison: MRI earlier same day.  CT yesterday.

CLINICAL DATA: Follow-up brain biopsy.

EXAM:
CT HEAD WITHOUT CONTRAST
TECHNIQUE: Contiguous axial images were obtained from the base of the skull
through the vertex without intravenous contrast.

[Series 3: head 5.0 h30s · axial · 0.41mm/px · z∈[-149,-4]mm · 12 of 35 slices shown, 15 images]
[im 3/35  brain]
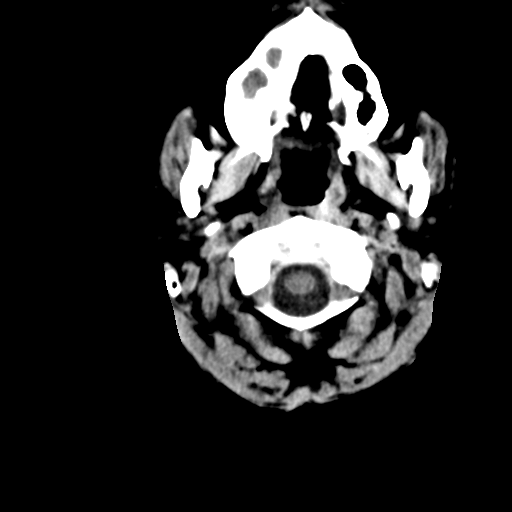
[im 3/35  bone]
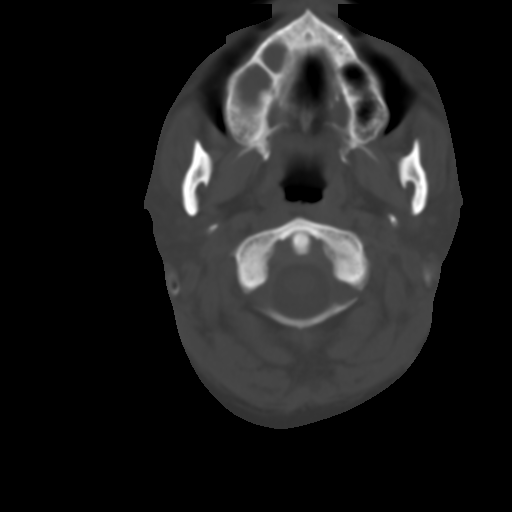
[im 5/35  brain]
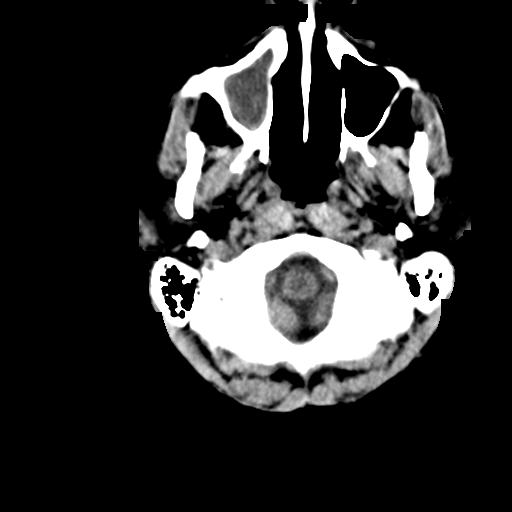
[im 8/35  brain]
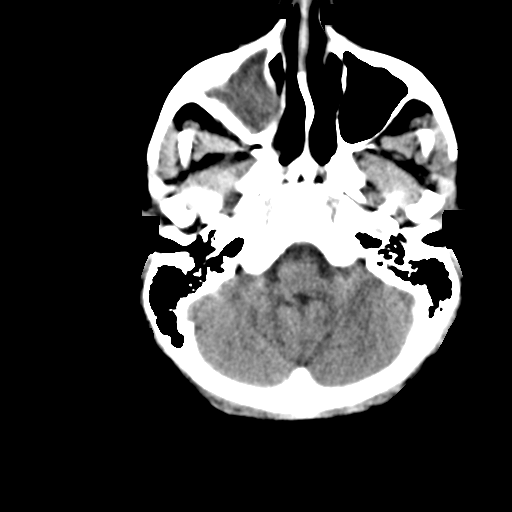
[im 11/35  brain]
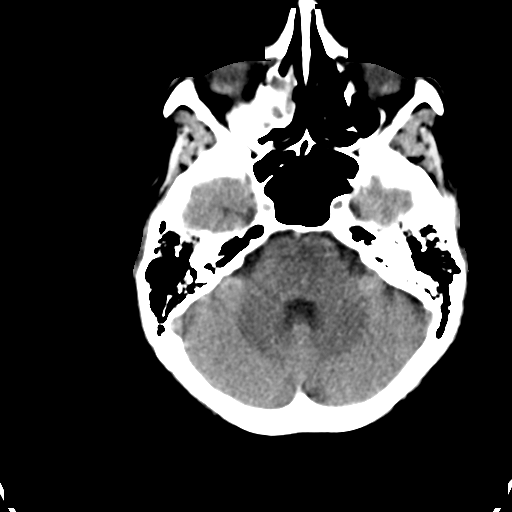
[im 13/35  brain]
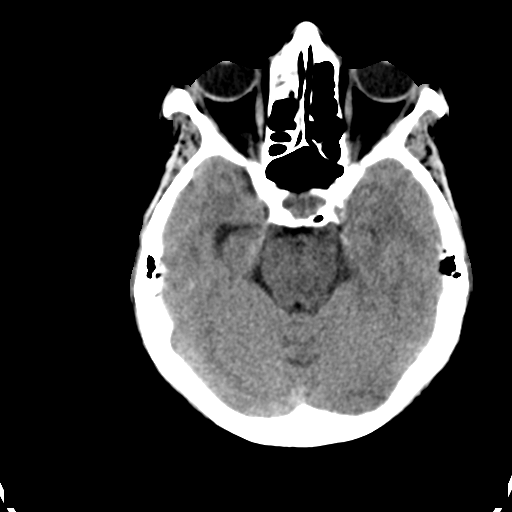
[im 13/35  bone]
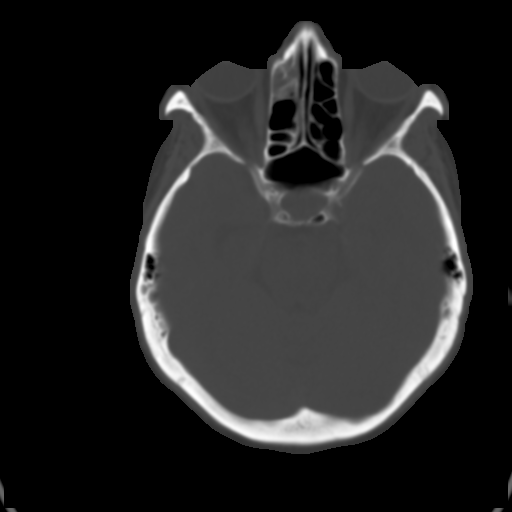
[im 16/35  brain]
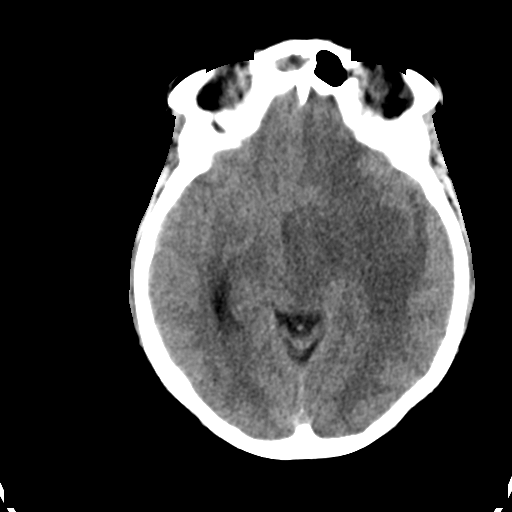
[im 19/35  brain]
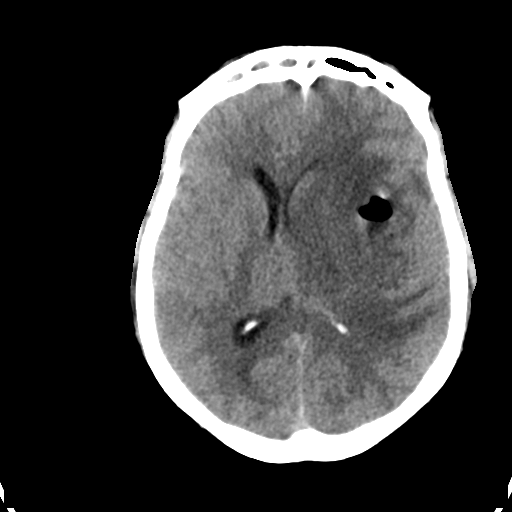
[im 22/35  brain]
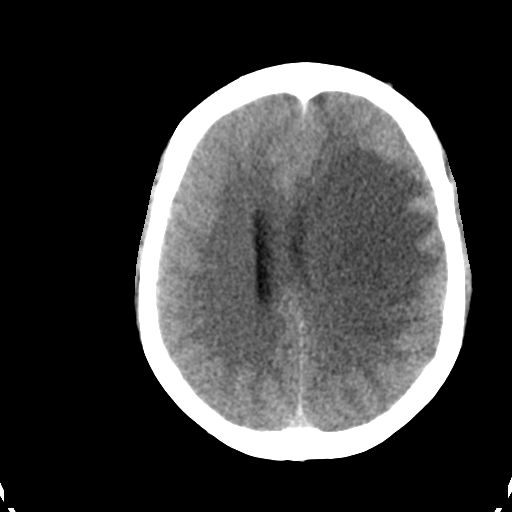
[im 24/35  brain]
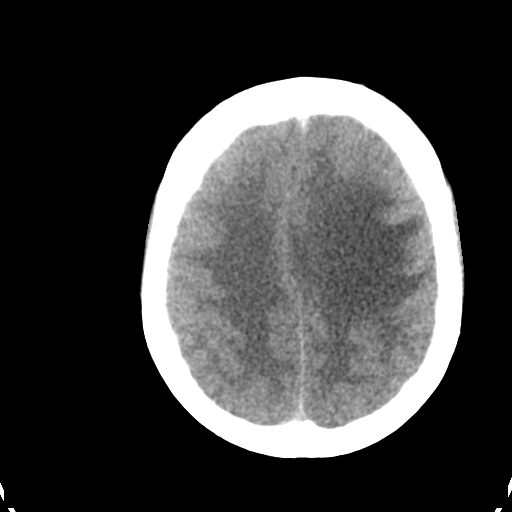
[im 24/35  bone]
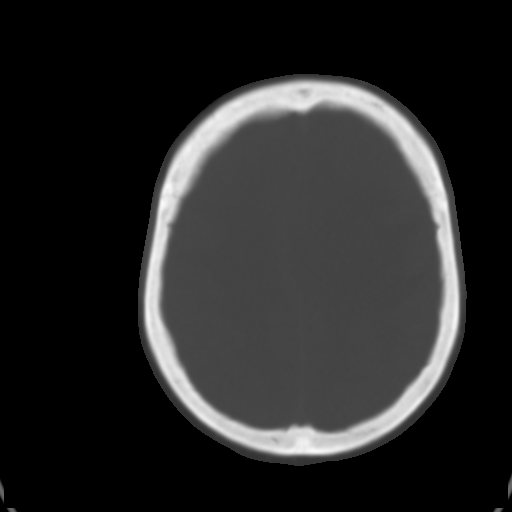
[im 27/35  brain]
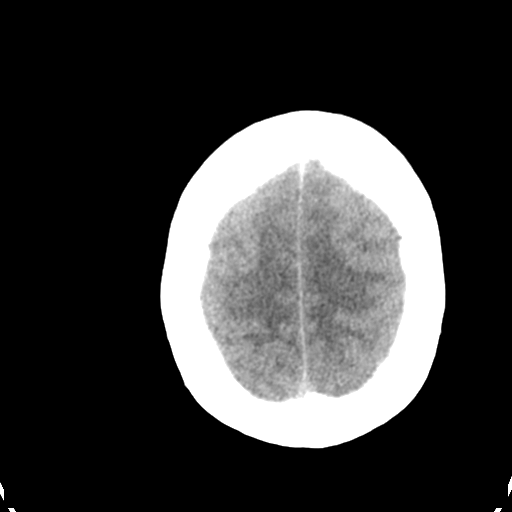
[im 30/35  brain]
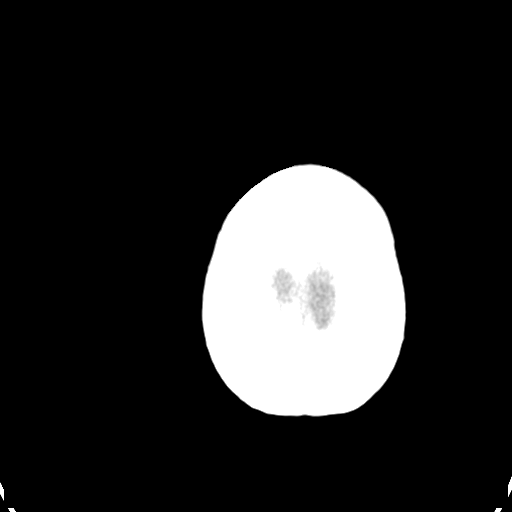
[im 32/35  brain]
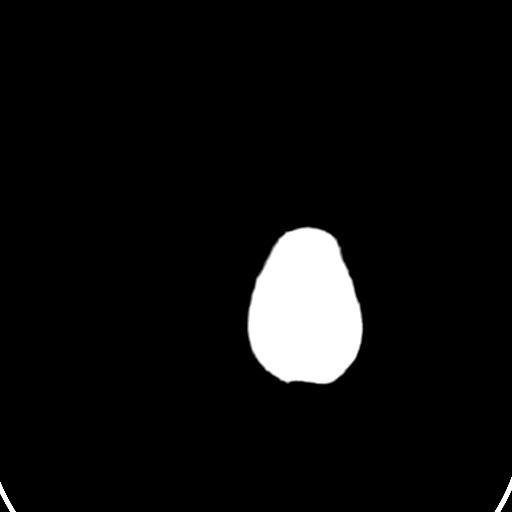

[Series 5: head 3.0 mpr cor · coronal · 0.33mm/px · 3 of 67 slices shown]
[im 17/67  brain]
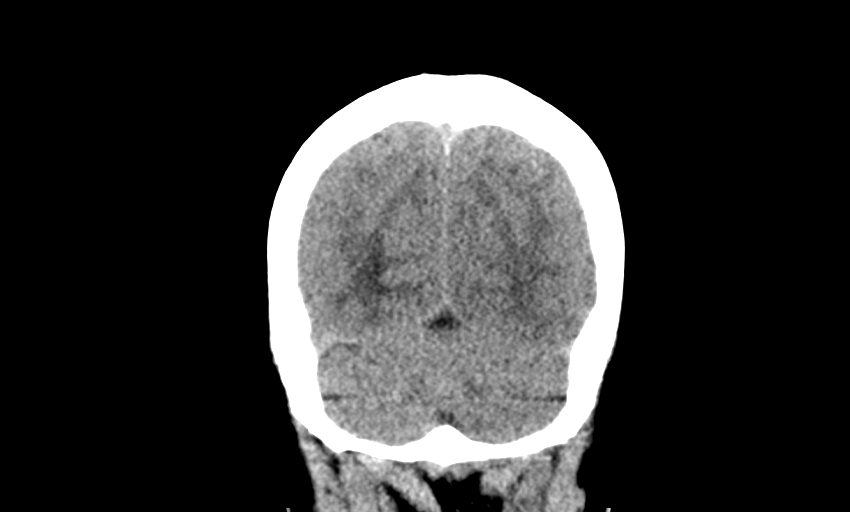
[im 34/67  brain]
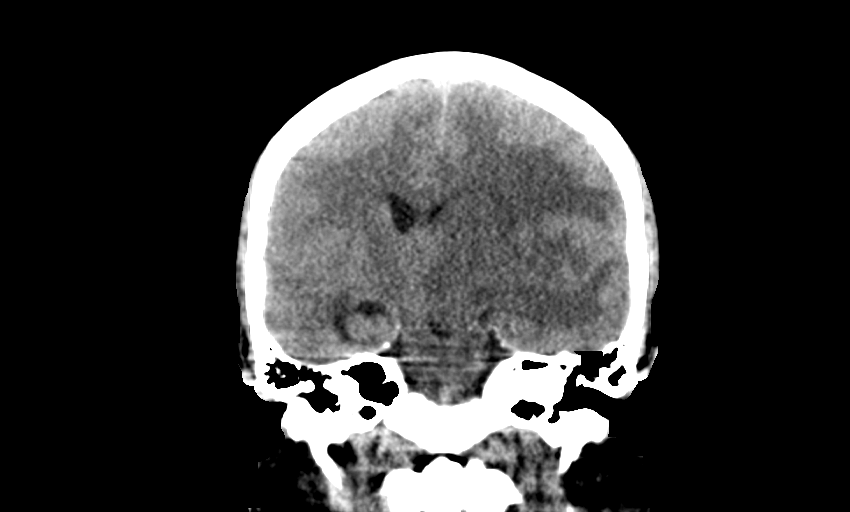
[im 50/67  brain]
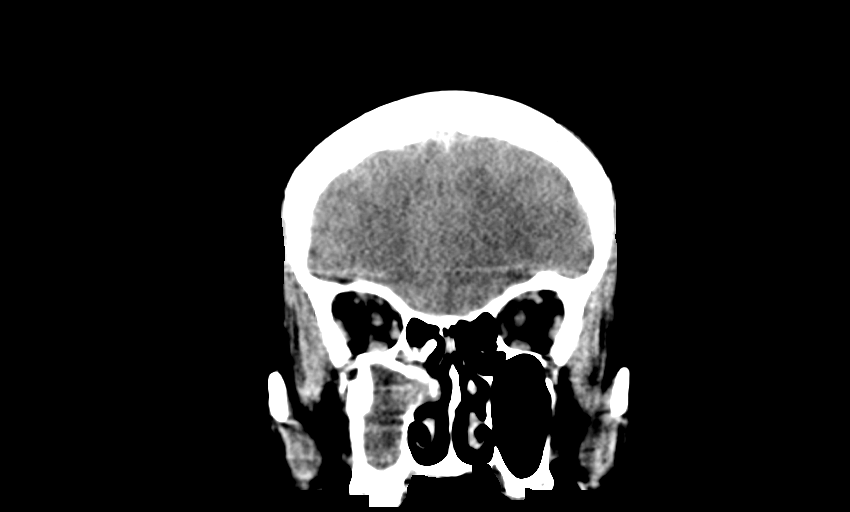

[15 of 40 positions shown; findings below may reference images not displayed]

FINDINGS: Brain: Left frontal burr hole. Biopsy site with air or Gel-Foam in
the region of the previous tumor with the epicenter in the left
basal ganglia. No evidence of postsurgical hemorrhage. No change in
regional mass effect with left-to-right shift 11 mm. No extra-axial
collection.

Vascular: No abnormal vascular finding.

Skull: Otherwise negative

Sinuses/Orbits: Ostiomeatal unit pattern of the paranasal sinuses on
the right.

Other: None
IMPRESSION: Biopsy site with air or Gel-Foam in the region of the previous tumor
with the epicenter in the left basal ganglia. No evidence of
postsurgical hemorrhage. No change in regional mass effect with
left-to-right shift 11 mm.

## 2020-03-22 IMAGING — CT CT HEAD W/O CM
4 series · 16 of 47 positions shown, 18 images · non-contrast
Comparison: CT 8 hours ago.  Brain MRI earlier today.

CLINICAL DATA: Mental status change. Recent diagnosis of brain
lesion post biopsy.

EXAM:
CT HEAD WITHOUT CONTRAST
TECHNIQUE: Contiguous axial images were obtained from the base of the skull
through the vertex without intravenous contrast.

[Series 3: head wo · axial · 0.39mm/px · z∈[+1326,+1446]mm · 7 of 32 slices shown, 9 images]
[im 4/32  brain]
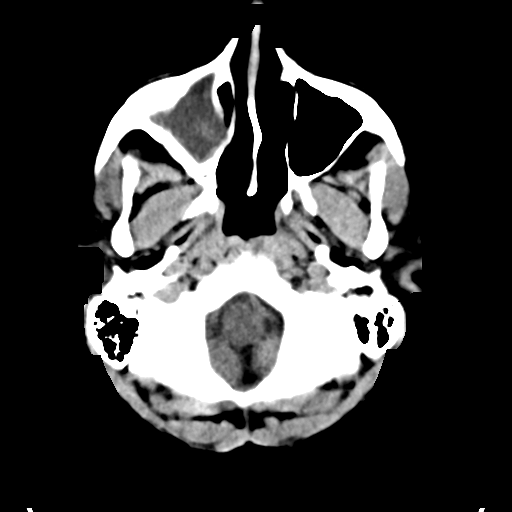
[im 4/32  bone]
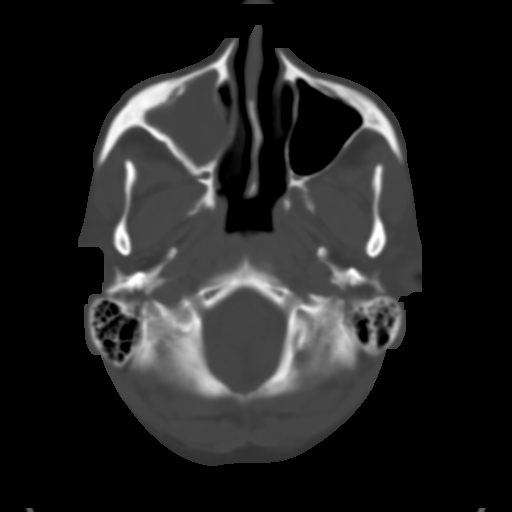
[im 8/32  brain]
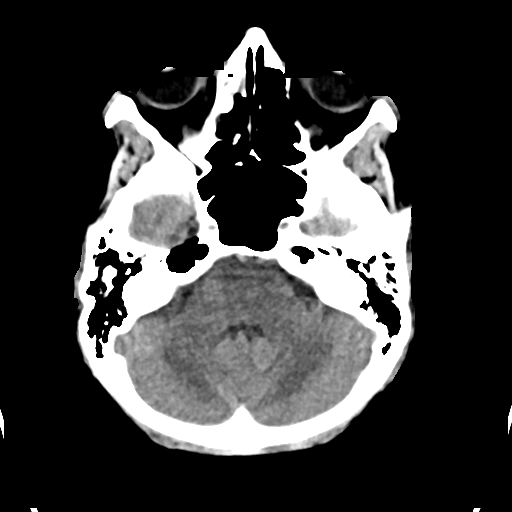
[im 12/32  brain]
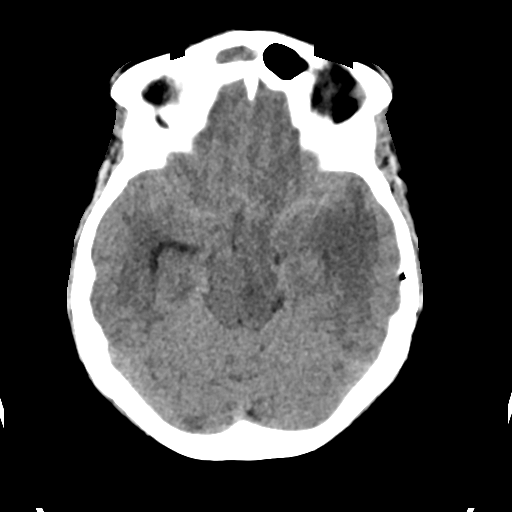
[im 16/32  brain]
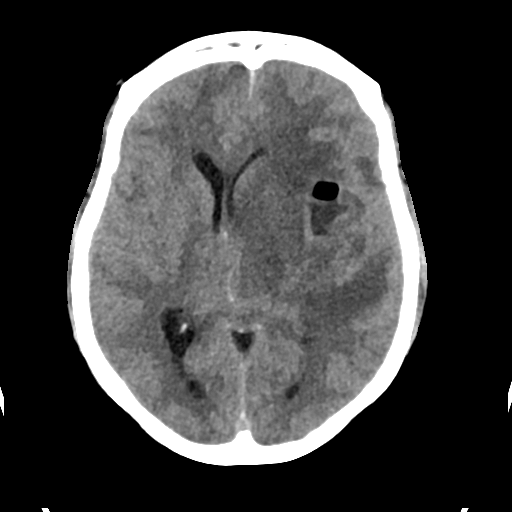
[im 20/32  brain]
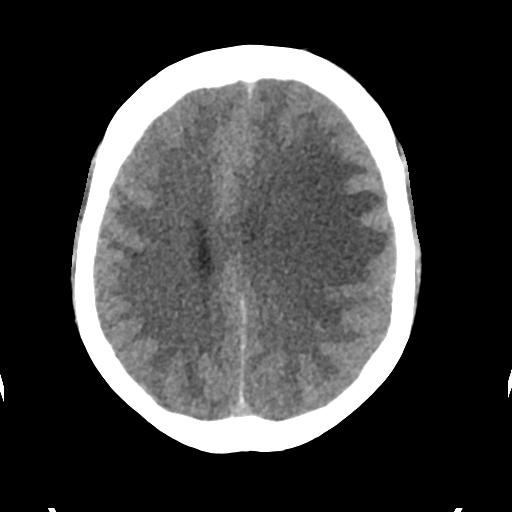
[im 20/32  bone]
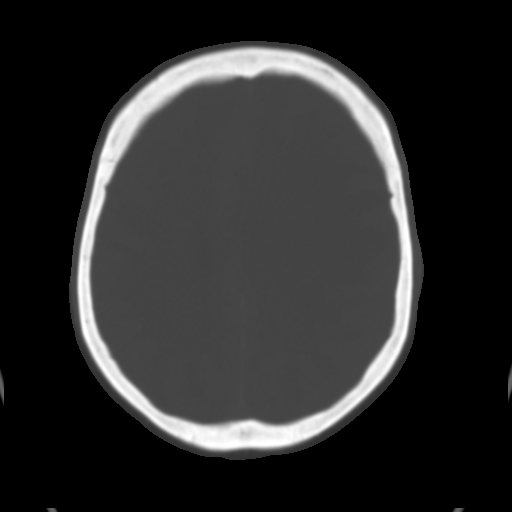
[im 24/32  brain]
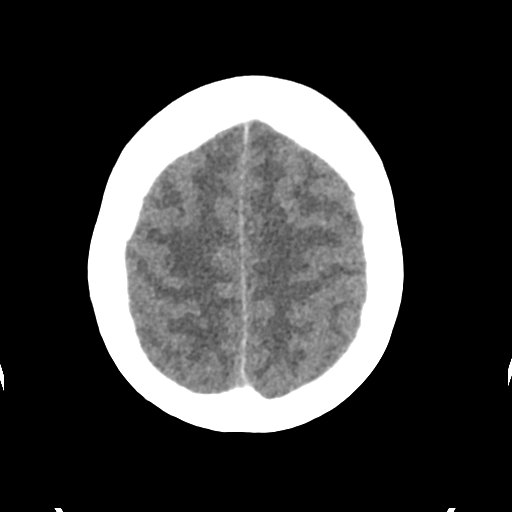
[im 28/32  brain]
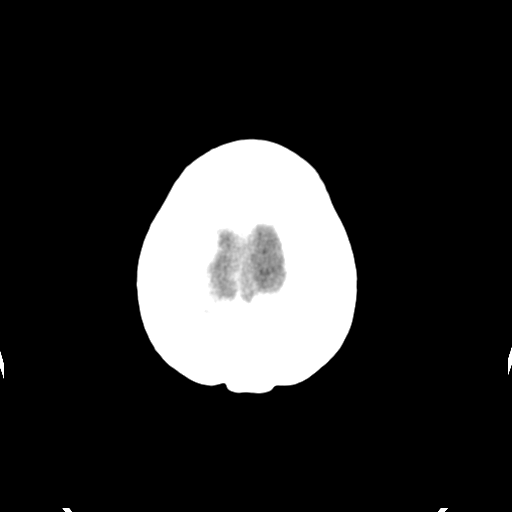

[Series 4: head bone · axial · 0.39mm/px · z∈[+1326,+1358]mm · 3 of 80 slices shown]
[im 8/80  bone]
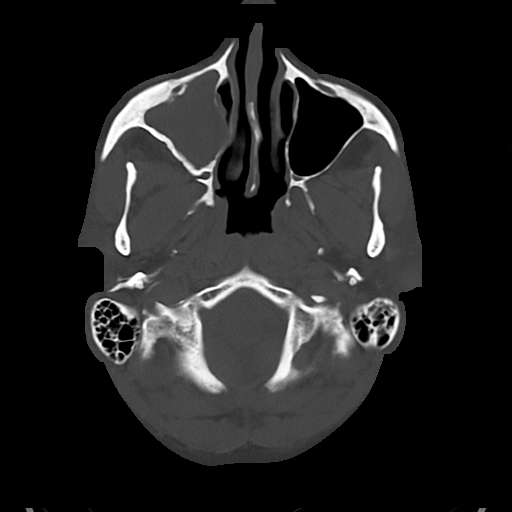
[im 16/80  bone]
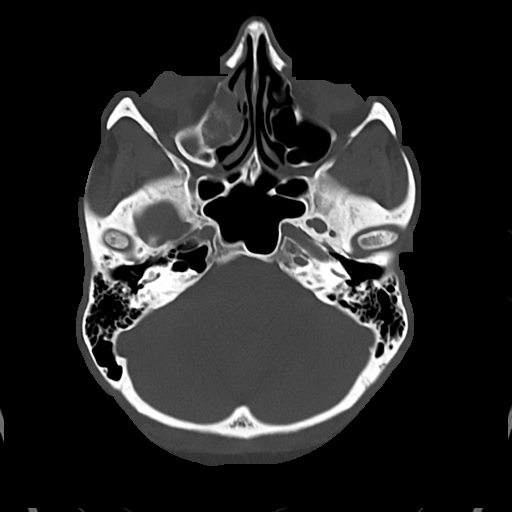
[im 24/80  bone]
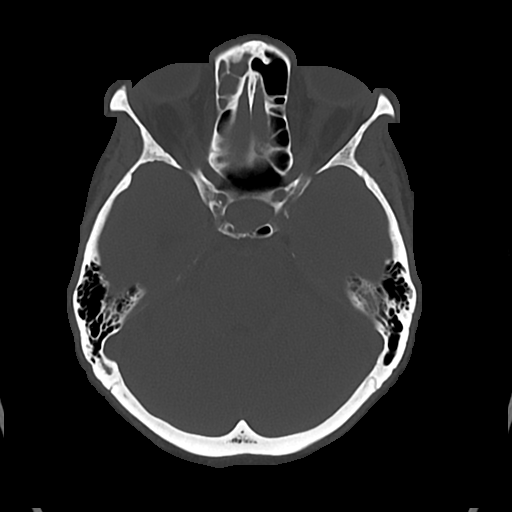

[Series 5: cor soft · coronal · 0.35mm/px · 3 of 73 slices shown]
[im 25/73  brain]
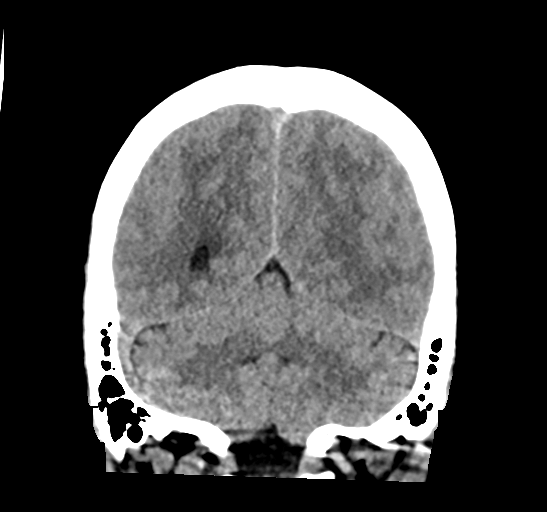
[im 33/73  brain]
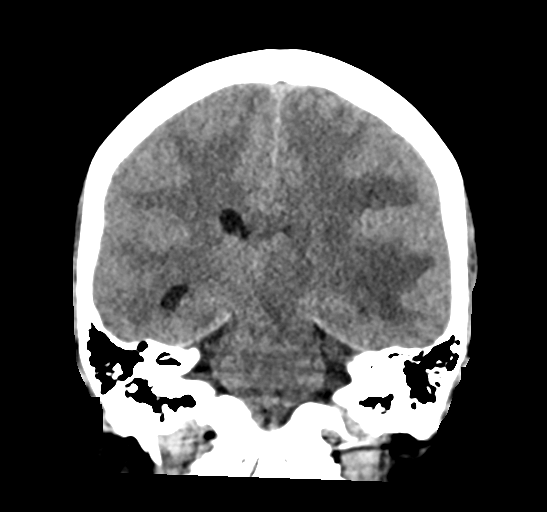
[im 41/73  brain]
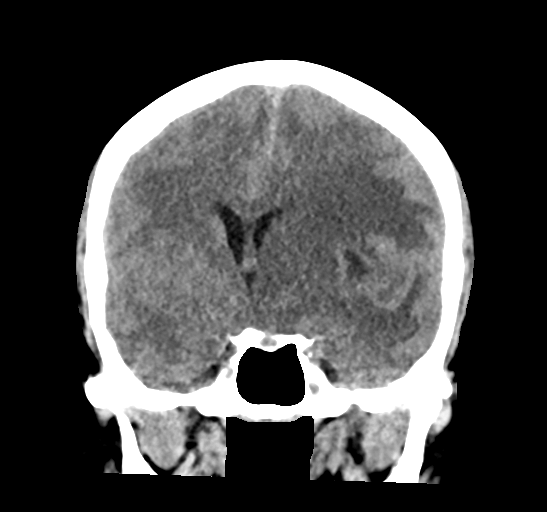

[Series 6: sag soft · sagittal · 0.38mm/px · 3 of 55 slices shown]
[im 19/55  brain]
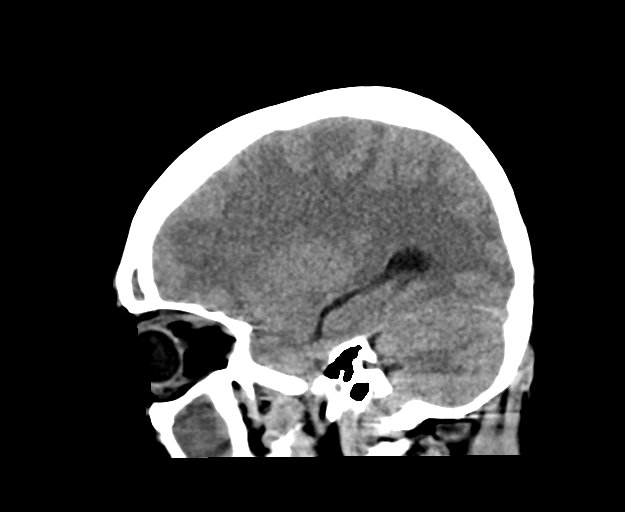
[im 28/55  brain]
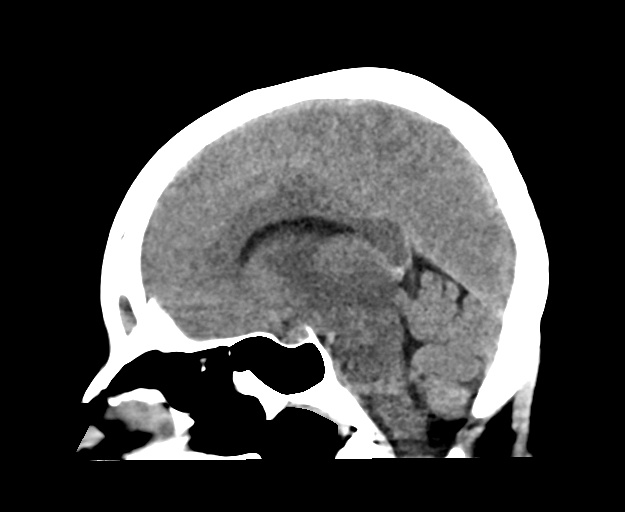
[im 37/55  brain]
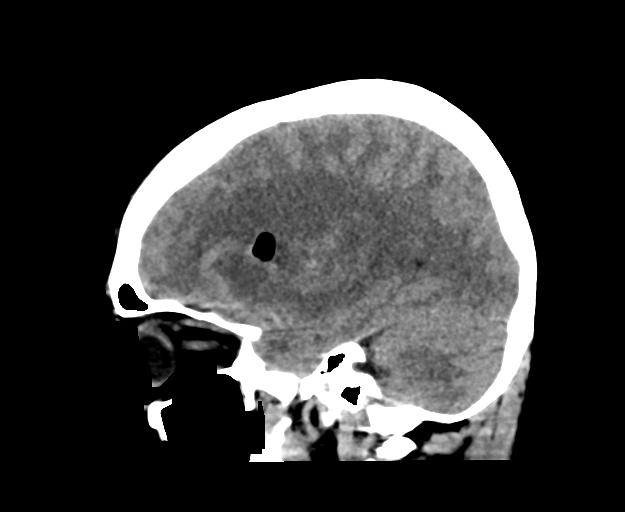

[16 of 47 positions shown; findings below may reference images not displayed]

FINDINGS: Brain: Stable size and appearance of the left brain lesion centered
in the basal ganglia. Stable internal air or Gel-Foam. Stable
regional mass effect. Left-to-right midline shift of 9 mm,
previously 11 mm. No evidence of lesional or acute hemorrhage.
Stable sulcal effacement. Lacunar infarct in the right thalamus
better appreciated on prior MRI. Stable ventricular size. Basilar
cisterns are patent. No developing subdural collection.

Vascular: No hyperdense vessel.

Skull: Left frontal burr hole.  No fracture or focal lesion.

Sinuses/Orbits: Unchanged appearance of right maxillary and frontal
sinusitis with opacification of anterior ethmoid air cells.

Other: Post biopsy changes in the left frontal scalp.
IMPRESSION: 1. Stable size and appearance of the left brain lesion centered in
the basal ganglia with internal air or Gel-Foam. Stable regional
mass effect. Slightly diminished left-to-right midline shift of 9
mm, previously 11 mm.
2. No evidence of hemorrhage or new abnormality.

## 2020-03-22 IMAGING — MR MR HEAD WO/W CM
11 of 15 series · 25 of 48 positions shown · IV contrast (gadavist)
Comparison: [DATE]

CLINICAL DATA: Brain mass

EXAM:
MRI HEAD WITHOUT AND WITH CONTRAST
TECHNIQUE: Multiplanar, multiecho pulse sequences of the brain and surrounding
structures were obtained without and with intravenous contrast.
CONTRAST:  8mL GADAVIST GADOBUTROL 1 MMOL/ML IV SOLN

[Series 2: FLAIR · sagittal · 3.0mm · 0.47mm/px · 1 of 36 slices shown (1 of 2)]
[im 1/36]
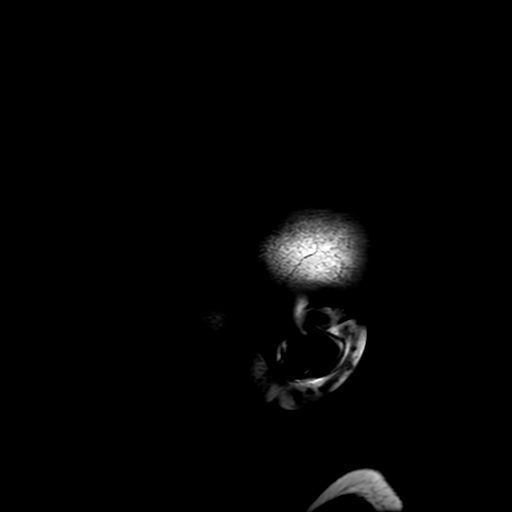

[Series 4: T2 · axial · 5.0mm · 0.43mm/px · 1 of 29 slices shown]
[im 1/29]
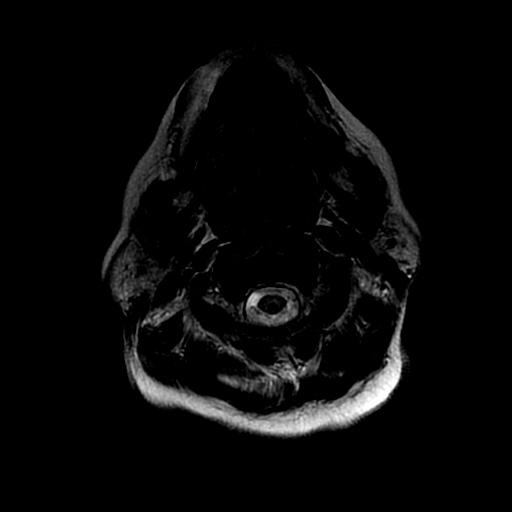

[Series 5: ax dti · axial · 3.0mm · 0.94mm/px · z∈[-98,+64]mm · 8 of 1534 slices shown]
[im 74/1534]
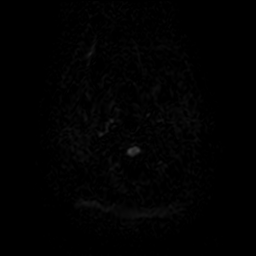
[im 293/1534]
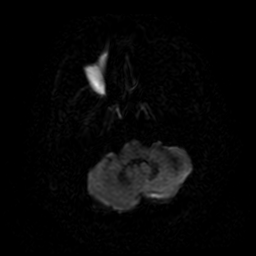
[im 439/1534]
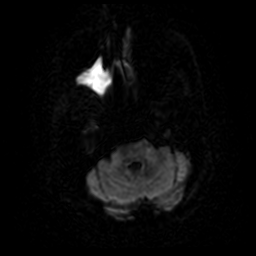
[im 658/1534]
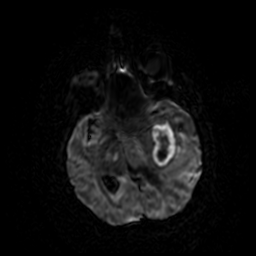
[im 877/1534]
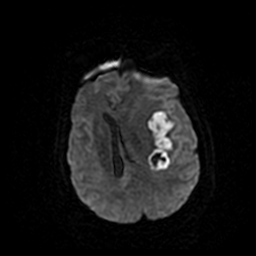
[im 1096/1534]
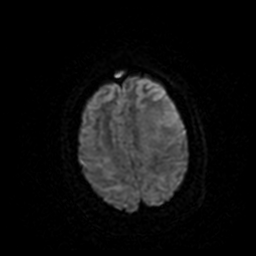
[im 1242/1534]
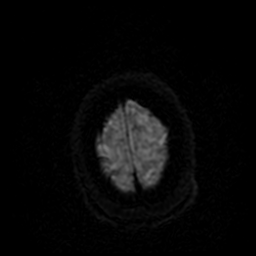
[im 1461/1534]
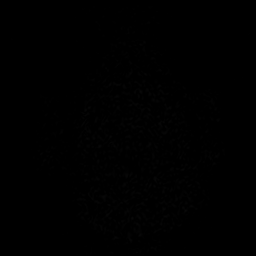

[Series 6: (person_name) · axial · 3.0mm · 0.47mm/px · 1 of 100 slices shown]
[im 1/100]
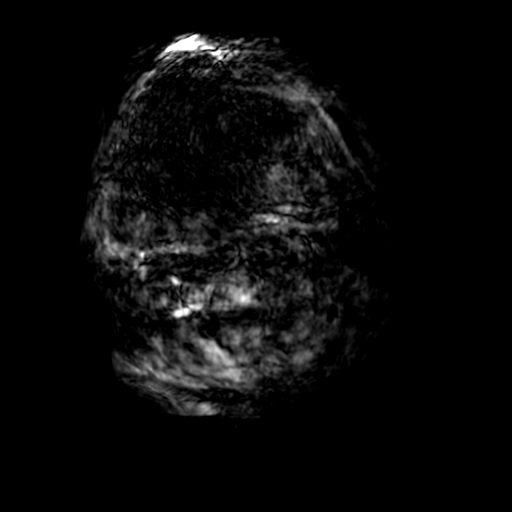

[Series 8: ax 3(person_name) · axial · 1.0mm · 1.02mm/px · z∈[-104,+69]mm · 2 of 174 slices shown (1 of 2)]
[im 1/174]
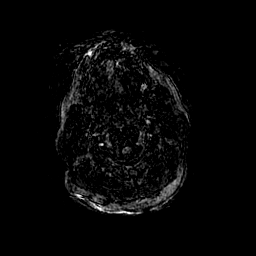
[im 174/174]
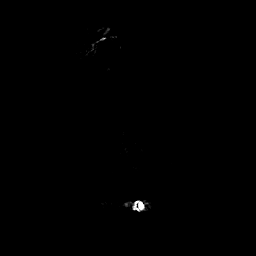

[Series 9: T2 post-contrast · coronal · 5.0mm · 0.20mm/px · 1 of 29 slices shown]
[im 1/29]
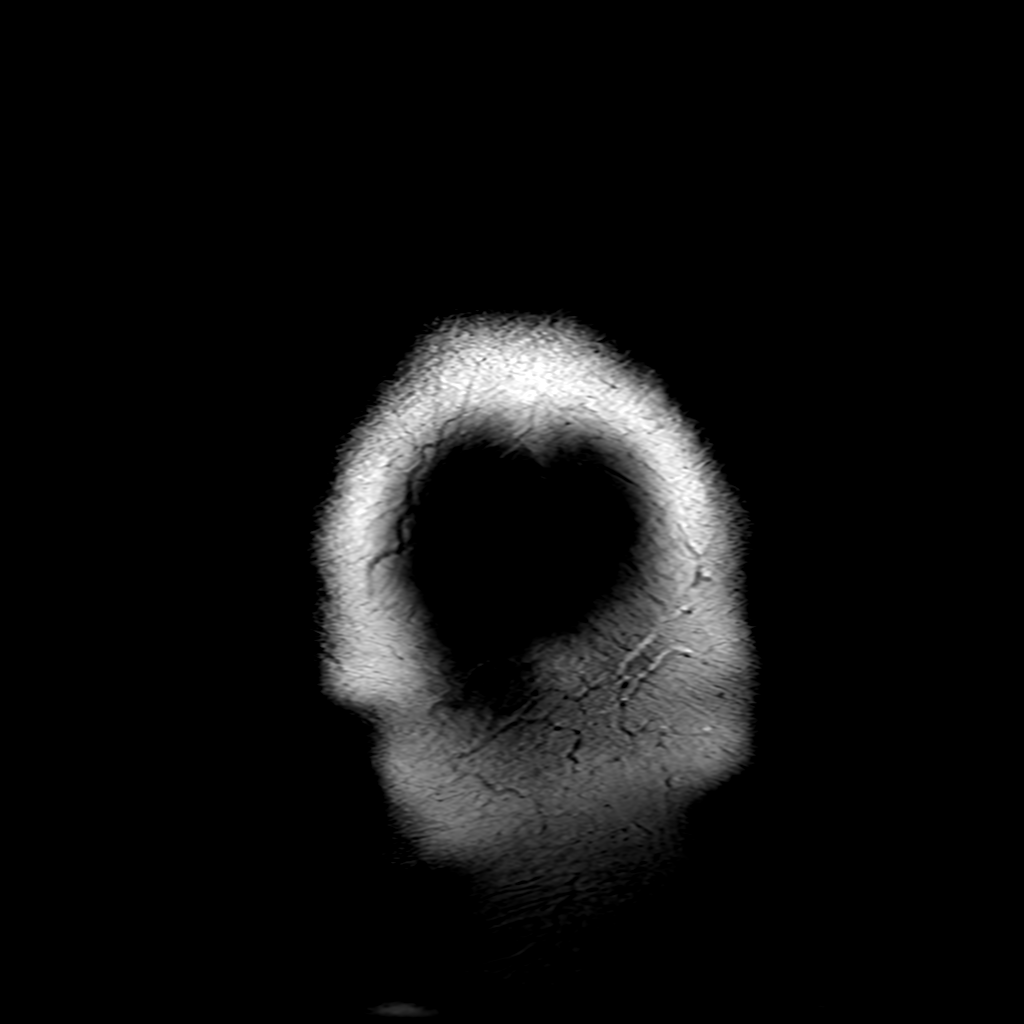

[Series 10: ax 3(person_name) · axial · 1.0mm · 1.02mm/px · z∈[-109,+64]mm · 3 of 174 slices shown (2 of 2)]
[im 1/174]
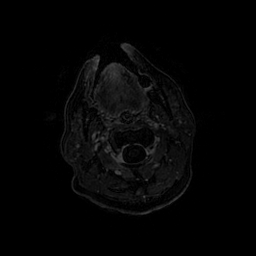
[im 87/174]
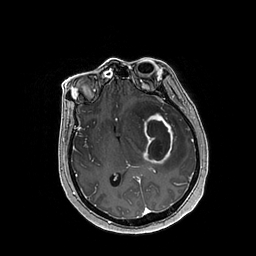
[im 174/174]
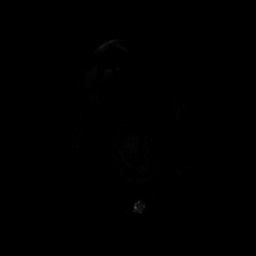

[Series 11: T1 · coronal · 5.0mm · 0.43mm/px · 1 of 28 slices shown]
[im 1/28]
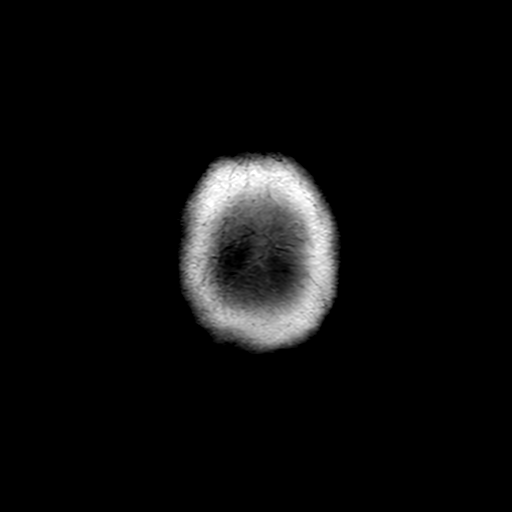

[Series 12: FLAIR · sagittal · 3.0mm · 0.47mm/px · 1 of 36 slices shown (2 of 2)]
[im 1/36]
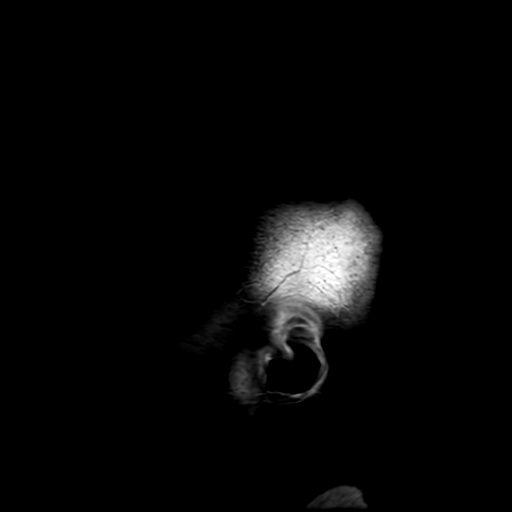

[Series 300: multiplanar reconstruction (mpr) · axial · 1.0mm · 0.50mm/px · z∈[-145,+111]mm · 4 of 257 slices shown (1 of 2)]
[im 1/257]
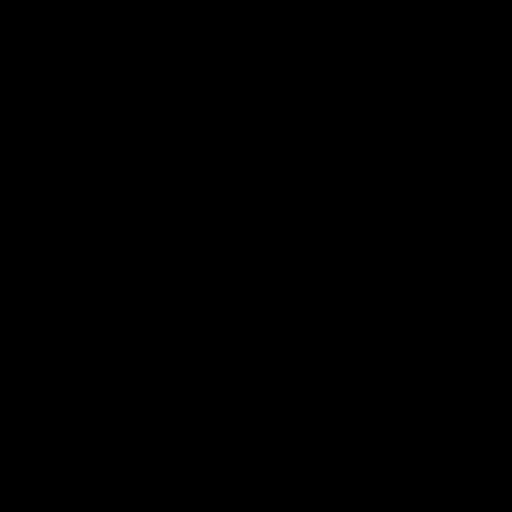
[im 86/257]
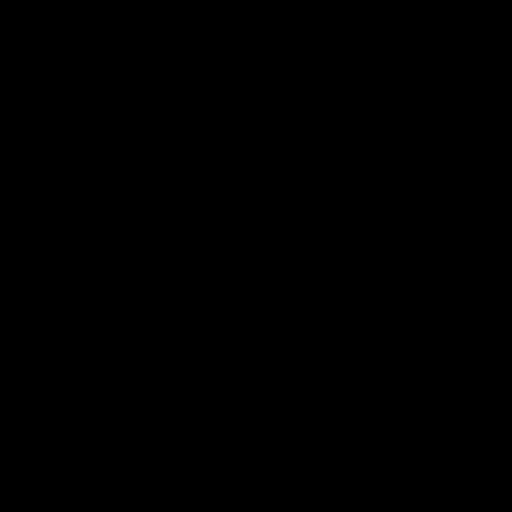
[im 171/257]
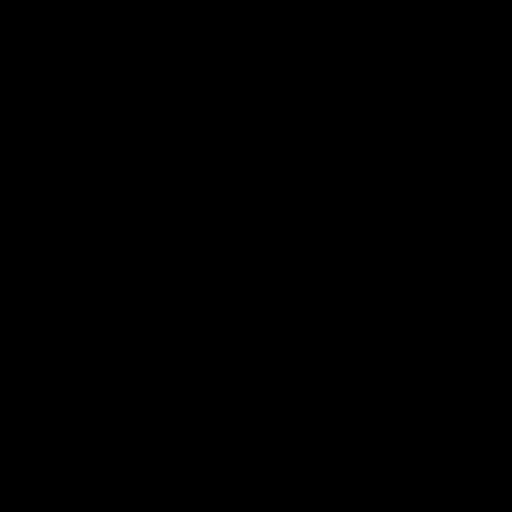
[im 257/257]
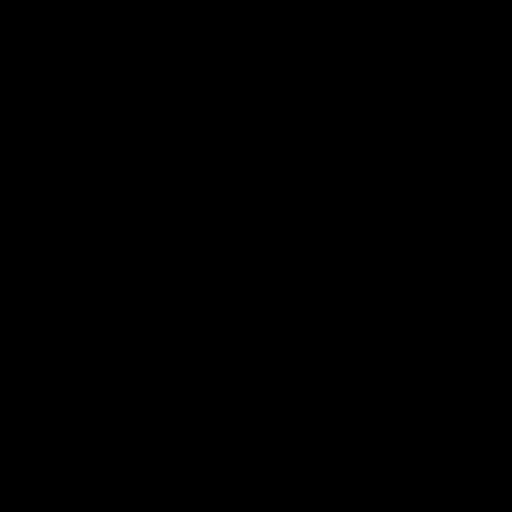

[Series 301: multiplanar reconstruction (mpr) · coronal · 1.0mm · 0.50mm/px · 2 of 244 slices shown (2 of 2)]
[im 1/244]
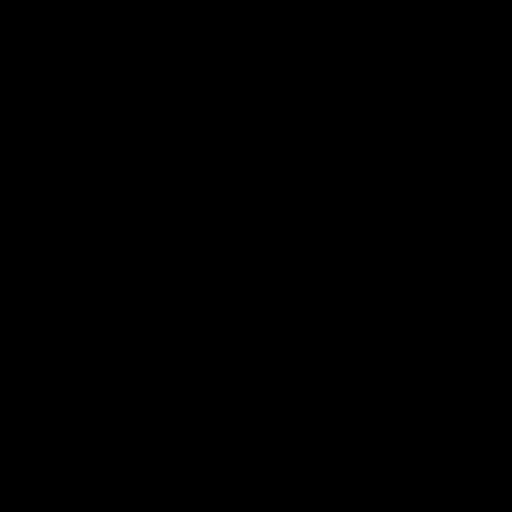
[im 82/244]
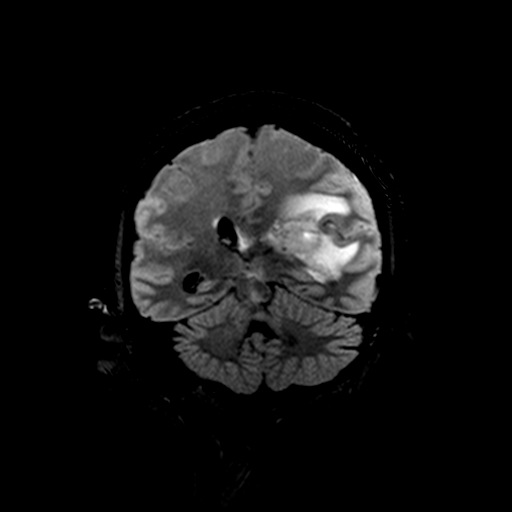

[25 of 48 positions shown; findings below may reference images not displayed]

FINDINGS: Brain: When accounting for differences in technique (axial scan
angle is slightly different), similar size of the mass centered
within the left basal ganglia, with the enhancing portion measuring
up to approximately 6.4 x 2.8 by 3.6 cm. Redemonstrated nodular
peripheral enhancement with central necrosis. Similar exuberant
surrounding T2/stir hyperintensity with marked effacement left
lateral ventricle and approximately 9 mm of rightward midline shift
at the foramina [place]. No progressive ventriculomegaly. The mass
restricts diffusion peripherally without central restricted
diffusion. Additionally, there is a new area of restricted diffusion
within the right thalamus, compatible with acute infarct (series
550, image 28). Mild associated T2/STIR hyperintensity.

Vascular: Limited evaluation; however, major vessels at the base of
the brain demonstrate normal flow voids.

Skull and upper cervical spine: Negative

Sinuses/Orbits: There is complete opacification of the right
frontal, anterior ethmoid air cells, and right maxillary sinus,
compatible with ostiomeatal unit pattern sinus disease.

Other: Small left mastoid effusion.
IMPRESSION: 1. New/acute infarct within the right thalamus.
2. When accounting for differences in technique/scan angle, similar
size/appearance of a large centrally necrotic mass centered in the
left basal ganglia with extensive surrounding T2/STIR signal
abnormality, primarily concerning for a high grade glioma. Resulting
effacement of the left lateral ventricle and approximately 9 mm of
right midline shift, not substantially changed. No progressive
ventriculomegaly.
3. Similar right-sided ostiomeatal unit pattern paranasal sinusitis.

Critical Value/emergent results were called by telephone at the time
of interpretation on [DATE] at [DATE] to provider Dr. SASO,
who verbally acknowledged these results.

## 2020-03-22 SURGERY — FRAMELESS BIOPSY WITH BRAINLAB
Anesthesia: General | Site: Head

## 2020-03-22 MED ORDER — CHLORHEXIDINE GLUCONATE 0.12 % MT SOLN
OROMUCOSAL | Status: AC
  Administered 2020-03-22: 15 mL via OROMUCOSAL
  Filled 2020-03-22: qty 15

## 2020-03-22 MED ORDER — SODIUM CHLORIDE 0.9 % IV SOLN: INTRAVENOUS | Status: DC

## 2020-03-22 MED ORDER — CLEVIDIPINE BUTYRATE 0.5 MG/ML IV EMUL
INTRAVENOUS | Status: AC
  Administered 2020-03-22: 8 mg/h via INTRAVENOUS
  Filled 2020-03-22: qty 50

## 2020-03-22 MED ORDER — CHLORHEXIDINE GLUCONATE CLOTH 2 % EX PADS
6.0000 | MEDICATED_PAD | Freq: Every day | CUTANEOUS | Status: DC
  Administered 2020-03-22 – 2020-05-03 (×40): 6 via TOPICAL

## 2020-03-22 MED ORDER — HYDROMORPHONE HCL 1 MG/ML IJ SOLN: 0.2500 mg | INTRAMUSCULAR | Status: DC | PRN

## 2020-03-22 MED ORDER — SODIUM CHLORIDE 0.9 % IV SOLN
2.0000 g | INTRAVENOUS | Status: AC
  Administered 2020-03-22: 2 g via INTRAVENOUS
  Filled 2020-03-22: qty 2

## 2020-03-22 MED ORDER — ORAL CARE MOUTH RINSE: 15.0000 mL | Freq: Once | OROMUCOSAL | Status: AC

## 2020-03-22 MED ORDER — ROCURONIUM BROMIDE 10 MG/ML (PF) SYRINGE
PREFILLED_SYRINGE | INTRAVENOUS | Status: DC | PRN
  Administered 2020-03-22: 40 mg via INTRAVENOUS
  Administered 2020-03-22: 30 mg via INTRAVENOUS

## 2020-03-22 MED ORDER — BACITRACIN ZINC 500 UNIT/GM EX OINT
TOPICAL_OINTMENT | CUTANEOUS | Status: DC | PRN
  Administered 2020-03-22: 1 via TOPICAL

## 2020-03-22 MED ORDER — VANCOMYCIN HCL IN DEXTROSE 1-5 GM/200ML-% IV SOLN
1000.0000 mg | Freq: Three times a day (TID) | INTRAVENOUS | Status: DC
  Administered 2020-03-23 – 2020-03-24 (×4): 1000 mg via INTRAVENOUS
  Filled 2020-03-22 (×4): qty 200

## 2020-03-22 MED ORDER — MORPHINE SULFATE (PF) 2 MG/ML IV SOLN
INTRAVENOUS | Status: AC
  Administered 2020-03-22: 2 mg via INTRAVENOUS
  Filled 2020-03-22: qty 1

## 2020-03-22 MED ORDER — ONDANSETRON HCL 4 MG/2ML IJ SOLN
INTRAMUSCULAR | Status: DC | PRN
  Administered 2020-03-22: 4 mg via INTRAVENOUS

## 2020-03-22 MED ORDER — MEPERIDINE HCL 25 MG/ML IJ SOLN: 6.2500 mg | INTRAMUSCULAR | Status: DC | PRN

## 2020-03-22 MED ORDER — THROMBIN 5000 UNITS EX SOLR
OROMUCOSAL | Status: DC | PRN
  Administered 2020-03-22: 5 mL

## 2020-03-22 MED ORDER — ONDANSETRON HCL 4 MG/2ML IJ SOLN: 4.0000 mg | Freq: Once | INTRAMUSCULAR | Status: DC | PRN

## 2020-03-22 MED ORDER — DEXAMETHASONE SODIUM PHOSPHATE 10 MG/ML IJ SOLN
10.0000 mg | Freq: Four times a day (QID) | INTRAMUSCULAR | Status: DC
  Administered 2020-03-23 – 2020-03-31 (×34): 10 mg via INTRAVENOUS
  Filled 2020-03-22 (×34): qty 1

## 2020-03-22 MED ORDER — BACITRACIN ZINC 500 UNIT/GM EX OINT
TOPICAL_OINTMENT | CUTANEOUS | Status: AC
  Filled 2020-03-22: qty 28.35

## 2020-03-22 MED ORDER — LIDOCAINE-EPINEPHRINE 1 %-1:100000 IJ SOLN
INTRAMUSCULAR | Status: DC | PRN
  Administered 2020-03-22: 2 mL

## 2020-03-22 MED ORDER — CHLORHEXIDINE GLUCONATE 0.12 % MT SOLN: 15.0000 mL | Freq: Once | OROMUCOSAL | Status: DC

## 2020-03-22 MED ORDER — 0.9 % SODIUM CHLORIDE (POUR BTL) OPTIME
TOPICAL | Status: DC | PRN
  Administered 2020-03-22: 1000 mL

## 2020-03-22 MED ORDER — LIDOCAINE 2% (20 MG/ML) 5 ML SYRINGE
INTRAMUSCULAR | Status: DC | PRN
  Administered 2020-03-22: 60 mg via INTRAVENOUS

## 2020-03-22 MED ORDER — CHLORHEXIDINE GLUCONATE 0.12 % MT SOLN: 15.0000 mL | Freq: Once | OROMUCOSAL | Status: AC

## 2020-03-22 MED ORDER — ACETAMINOPHEN 650 MG RE SUPP: 650.0000 mg | RECTAL | Status: DC | PRN

## 2020-03-22 MED ORDER — ACETAMINOPHEN 325 MG PO TABS
650.0000 mg | ORAL_TABLET | ORAL | Status: DC | PRN
  Administered 2020-04-23: 650 mg via ORAL
  Filled 2020-03-22 (×2): qty 2

## 2020-03-22 MED ORDER — SODIUM CHLORIDE 3 % IV SOLN
INTRAVENOUS | Status: DC
  Filled 2020-03-22 (×10): qty 500

## 2020-03-22 MED ORDER — HYDRALAZINE HCL 20 MG/ML IJ SOLN
10.0000 mg | Freq: Four times a day (QID) | INTRAMUSCULAR | Status: DC | PRN
  Administered 2020-03-24: 10 mg via INTRAVENOUS
  Administered 2020-03-26 – 2020-03-27 (×2): 20 mg via INTRAVENOUS
  Filled 2020-03-22 (×3): qty 1

## 2020-03-22 MED ORDER — HYDRALAZINE HCL 20 MG/ML IJ SOLN
INTRAMUSCULAR | Status: AC
  Filled 2020-03-22: qty 1

## 2020-03-22 MED ORDER — HYDROCODONE-ACETAMINOPHEN 5-325 MG PO TABS
1.0000 | ORAL_TABLET | ORAL | Status: DC | PRN
  Administered 2020-04-06: 1 via ORAL
  Filled 2020-03-22: qty 1

## 2020-03-22 MED ORDER — BUPIVACAINE-EPINEPHRINE (PF) 0.5% -1:200000 IJ SOLN
INTRAMUSCULAR | Status: DC | PRN
  Administered 2020-03-22: 2 mL

## 2020-03-22 MED ORDER — PROMETHAZINE HCL 25 MG/ML IJ SOLN: 6.2500 mg | INTRAMUSCULAR | Status: DC | PRN

## 2020-03-22 MED ORDER — MORPHINE SULFATE (PF) 2 MG/ML IV SOLN: 1.0000 mg | INTRAVENOUS | Status: DC | PRN

## 2020-03-22 MED ORDER — LABETALOL HCL 5 MG/ML IV SOLN
INTRAVENOUS | Status: AC
  Administered 2020-03-22: 10 mg via INTRAVENOUS
  Filled 2020-03-22: qty 4

## 2020-03-22 MED ORDER — SODIUM CHLORIDE 0.9 % IV SOLN
0.0125 ug/kg/min | INTRAVENOUS | Status: AC
  Administered 2020-03-22: .1 ug/kg/min via INTRAVENOUS
  Filled 2020-03-22: qty 2000

## 2020-03-22 MED ORDER — ESMOLOL HCL 100 MG/10ML IV SOLN
INTRAVENOUS | Status: DC | PRN
  Administered 2020-03-22: 30 mg via INTRAVENOUS
  Administered 2020-03-22: 20 mg via INTRAVENOUS
  Administered 2020-03-22: 30 mg via INTRAVENOUS

## 2020-03-22 MED ORDER — SUGAMMADEX SODIUM 200 MG/2ML IV SOLN
INTRAVENOUS | Status: DC | PRN
  Administered 2020-03-22: 100 mg via INTRAVENOUS

## 2020-03-22 MED ORDER — INSULIN ASPART 100 UNIT/ML ~~LOC~~ SOLN
0.0000 [IU] | SUBCUTANEOUS | Status: DC
  Administered 2020-03-23: 4 [IU] via SUBCUTANEOUS
  Administered 2020-03-23: 7 [IU] via SUBCUTANEOUS
  Administered 2020-03-23: 4 [IU] via SUBCUTANEOUS
  Administered 2020-03-23: 7 [IU] via SUBCUTANEOUS
  Administered 2020-03-23 (×2): 4 [IU] via SUBCUTANEOUS
  Administered 2020-03-23: 3 [IU] via SUBCUTANEOUS
  Administered 2020-03-24: 7 [IU] via SUBCUTANEOUS
  Administered 2020-03-24: 4 [IU] via SUBCUTANEOUS
  Administered 2020-03-24: 7 [IU] via SUBCUTANEOUS
  Administered 2020-03-24 (×2): 3 [IU] via SUBCUTANEOUS
  Administered 2020-03-25: 4 [IU] via SUBCUTANEOUS
  Administered 2020-03-25 (×2): 7 [IU] via SUBCUTANEOUS
  Administered 2020-03-25: 4 [IU] via SUBCUTANEOUS
  Administered 2020-03-25: 7 [IU] via SUBCUTANEOUS
  Administered 2020-03-25: 4 [IU] via SUBCUTANEOUS
  Administered 2020-03-26: 7 [IU] via SUBCUTANEOUS
  Administered 2020-03-26 (×2): 4 [IU] via SUBCUTANEOUS
  Administered 2020-03-26 (×3): 7 [IU] via SUBCUTANEOUS
  Administered 2020-03-26: 4 [IU] via SUBCUTANEOUS
  Administered 2020-03-27: 7 [IU] via SUBCUTANEOUS
  Administered 2020-03-27: 11 [IU] via SUBCUTANEOUS
  Administered 2020-03-27 (×2): 7 [IU] via SUBCUTANEOUS
  Administered 2020-03-27: 11 [IU] via SUBCUTANEOUS
  Administered 2020-03-27 – 2020-03-29 (×7): 7 [IU] via SUBCUTANEOUS
  Administered 2020-03-29: 11 [IU] via SUBCUTANEOUS
  Administered 2020-03-29 (×4): 7 [IU] via SUBCUTANEOUS
  Administered 2020-03-30 (×3): 11 [IU] via SUBCUTANEOUS
  Administered 2020-03-30 (×3): 7 [IU] via SUBCUTANEOUS
  Administered 2020-03-30: 11 [IU] via SUBCUTANEOUS
  Administered 2020-03-31: 7 [IU] via SUBCUTANEOUS
  Administered 2020-03-31: 4 [IU] via SUBCUTANEOUS
  Administered 2020-03-31 (×4): 7 [IU] via SUBCUTANEOUS
  Administered 2020-04-01 (×2): 3 [IU] via SUBCUTANEOUS
  Administered 2020-04-01: 4 [IU] via SUBCUTANEOUS
  Administered 2020-04-01: 3 [IU] via SUBCUTANEOUS
  Administered 2020-04-01: 4 [IU] via SUBCUTANEOUS
  Administered 2020-04-02 – 2020-04-03 (×6): 3 [IU] via SUBCUTANEOUS
  Administered 2020-04-04: 4 [IU] via SUBCUTANEOUS
  Administered 2020-04-04: 3 [IU] via SUBCUTANEOUS
  Administered 2020-04-05: 4 [IU] via SUBCUTANEOUS
  Administered 2020-04-05: 3 [IU] via SUBCUTANEOUS
  Administered 2020-04-05 – 2020-04-06 (×2): 4 [IU] via SUBCUTANEOUS
  Administered 2020-04-06: 3 [IU] via SUBCUTANEOUS
  Administered 2020-04-06 – 2020-04-07 (×4): 4 [IU] via SUBCUTANEOUS
  Administered 2020-04-07: 3 [IU] via SUBCUTANEOUS
  Administered 2020-04-07: 4 [IU] via SUBCUTANEOUS
  Administered 2020-04-07: 3 [IU] via SUBCUTANEOUS
  Administered 2020-04-07 – 2020-04-08 (×3): 4 [IU] via SUBCUTANEOUS
  Administered 2020-04-08: 3 [IU] via SUBCUTANEOUS
  Administered 2020-04-08: 4 [IU] via SUBCUTANEOUS
  Administered 2020-04-08 (×2): 3 [IU] via SUBCUTANEOUS
  Administered 2020-04-09 (×2): 4 [IU] via SUBCUTANEOUS
  Administered 2020-04-09: 3 [IU] via SUBCUTANEOUS
  Administered 2020-04-09: 4 [IU] via SUBCUTANEOUS
  Administered 2020-04-09: 3 [IU] via SUBCUTANEOUS
  Administered 2020-04-09 – 2020-04-10 (×5): 4 [IU] via SUBCUTANEOUS
  Administered 2020-04-11 (×3): 3 [IU] via SUBCUTANEOUS
  Administered 2020-04-12: 4 [IU] via SUBCUTANEOUS
  Administered 2020-04-12: 3 [IU] via SUBCUTANEOUS
  Administered 2020-04-12: 4 [IU] via SUBCUTANEOUS
  Administered 2020-04-13 (×2): 3 [IU] via SUBCUTANEOUS
  Administered 2020-04-13 – 2020-04-14 (×2): 4 [IU] via SUBCUTANEOUS
  Administered 2020-04-15 – 2020-05-03 (×22): 3 [IU] via SUBCUTANEOUS

## 2020-03-22 MED ORDER — INSULIN ASPART 100 UNIT/ML ~~LOC~~ SOLN
0.0000 [IU] | Freq: Three times a day (TID) | SUBCUTANEOUS | Status: DC
  Administered 2020-03-22: 3 [IU] via SUBCUTANEOUS

## 2020-03-22 MED ORDER — FENTANYL CITRATE (PF) 250 MCG/5ML IJ SOLN
INTRAMUSCULAR | Status: AC
  Filled 2020-03-22: qty 5

## 2020-03-22 MED ORDER — VANCOMYCIN HCL IN DEXTROSE 1-5 GM/200ML-% IV SOLN
1000.0000 mg | INTRAVENOUS | Status: DC
  Filled 2020-03-22: qty 200

## 2020-03-22 MED ORDER — PANTOPRAZOLE SODIUM 40 MG IV SOLR
40.0000 mg | Freq: Every day | INTRAVENOUS | Status: DC
  Administered 2020-03-22 – 2020-03-23 (×2): 40 mg via INTRAVENOUS
  Filled 2020-03-22 (×2): qty 40

## 2020-03-22 MED ORDER — PHENYLEPHRINE HCL-NACL 10-0.9 MG/250ML-% IV SOLN
INTRAVENOUS | Status: DC | PRN
  Administered 2020-03-22: 30 ug/min via INTRAVENOUS

## 2020-03-22 MED ORDER — LABETALOL HCL 5 MG/ML IV SOLN
10.0000 mg | INTRAVENOUS | Status: DC | PRN
  Administered 2020-03-22 – 2020-03-23 (×2): 10 mg via INTRAVENOUS
  Administered 2020-03-26: 20 mg via INTRAVENOUS
  Filled 2020-03-22: qty 8
  Filled 2020-03-22: qty 4

## 2020-03-22 MED ORDER — THROMBIN 5000 UNITS EX SOLR
CUTANEOUS | Status: AC
  Filled 2020-03-22: qty 5000

## 2020-03-22 MED ORDER — PROMETHAZINE HCL 25 MG PO TABS: 12.5000 mg | ORAL_TABLET | ORAL | Status: DC | PRN

## 2020-03-22 MED ORDER — BUPIVACAINE-EPINEPHRINE 0.5% -1:200000 IJ SOLN
INTRAMUSCULAR | Status: AC
  Filled 2020-03-22: qty 1

## 2020-03-22 MED ORDER — LIDOCAINE-EPINEPHRINE 1 %-1:100000 IJ SOLN
INTRAMUSCULAR | Status: AC
  Filled 2020-03-22: qty 1

## 2020-03-22 MED ORDER — FENTANYL CITRATE (PF) 100 MCG/2ML IJ SOLN: 25.0000 ug | INTRAMUSCULAR | Status: DC | PRN

## 2020-03-22 MED ORDER — GADOBUTROL 1 MMOL/ML IV SOLN
8.0000 mL | Freq: Once | INTRAVENOUS | Status: AC | PRN
  Administered 2020-03-22: 8 mL via INTRAVENOUS

## 2020-03-22 MED ORDER — DEXAMETHASONE SODIUM PHOSPHATE 10 MG/ML IJ SOLN
INTRAMUSCULAR | Status: DC | PRN
  Administered 2020-03-22: 10 mg via INTRAVENOUS

## 2020-03-22 MED ORDER — ORAL CARE MOUTH RINSE
15.0000 mL | Freq: Two times a day (BID) | OROMUCOSAL | Status: DC
  Administered 2020-03-22: 15 mL via OROMUCOSAL

## 2020-03-22 MED ORDER — METRONIDAZOLE IN NACL 5-0.79 MG/ML-% IV SOLN
500.0000 mg | Freq: Three times a day (TID) | INTRAVENOUS | Status: DC
  Administered 2020-03-22 – 2020-03-24 (×6): 500 mg via INTRAVENOUS
  Filled 2020-03-22 (×6): qty 100

## 2020-03-22 MED ORDER — SODIUM CHLORIDE 0.9 % IV SOLN
INTRAVENOUS | Status: DC | PRN
  Administered 2020-03-22: 500 mL

## 2020-03-22 MED ORDER — CEFAZOLIN SODIUM-DEXTROSE 2-4 GM/100ML-% IV SOLN: 2.0000 g | Freq: Once | INTRAVENOUS | Status: DC

## 2020-03-22 MED ORDER — PROPOFOL 10 MG/ML IV BOLUS
INTRAVENOUS | Status: AC
  Filled 2020-03-22: qty 20

## 2020-03-22 MED ORDER — CLEVIDIPINE BUTYRATE 0.5 MG/ML IV EMUL
0.0000 mg/h | INTRAVENOUS | Status: DC
  Administered 2020-03-22 (×2): 21 mg/h via INTRAVENOUS
  Administered 2020-03-23: 17 mg/h via INTRAVENOUS
  Administered 2020-03-23: 4 mg/h via INTRAVENOUS
  Administered 2020-03-23: 3 mg/h via INTRAVENOUS
  Administered 2020-03-23: 7 mg/h via INTRAVENOUS
  Administered 2020-03-24: 4 mg/h via INTRAVENOUS
  Administered 2020-03-24: 2 mg/h via INTRAVENOUS
  Administered 2020-03-24: 8 mg/h via INTRAVENOUS
  Filled 2020-03-22: qty 100
  Filled 2020-03-22 (×5): qty 50
  Filled 2020-03-22: qty 100
  Filled 2020-03-22: qty 50

## 2020-03-22 MED ORDER — CEFAZOLIN SODIUM-DEXTROSE 2-4 GM/100ML-% IV SOLN
INTRAVENOUS | Status: AC
  Filled 2020-03-22: qty 100

## 2020-03-22 MED ORDER — LACTATED RINGERS IV SOLN: INTRAVENOUS | Status: DC

## 2020-03-22 MED ORDER — PROPOFOL 10 MG/ML IV BOLUS
INTRAVENOUS | Status: DC | PRN
  Administered 2020-03-22: 150 mg via INTRAVENOUS

## 2020-03-22 MED ORDER — SODIUM CHLORIDE 0.9 % IV SOLN
2.0000 g | Freq: Two times a day (BID) | INTRAVENOUS | Status: DC
  Administered 2020-03-22 – 2020-05-03 (×84): 2 g via INTRAVENOUS
  Filled 2020-03-22: qty 2
  Filled 2020-03-22: qty 20
  Filled 2020-03-22 (×4): qty 2
  Filled 2020-03-22: qty 20
  Filled 2020-03-22: qty 2
  Filled 2020-03-22: qty 20
  Filled 2020-03-22: qty 2
  Filled 2020-03-22: qty 20
  Filled 2020-03-22 (×2): qty 2
  Filled 2020-03-22 (×3): qty 20
  Filled 2020-03-22: qty 2
  Filled 2020-03-22 (×2): qty 20
  Filled 2020-03-22 (×7): qty 2
  Filled 2020-03-22 (×3): qty 20
  Filled 2020-03-22 (×2): qty 2
  Filled 2020-03-22: qty 20
  Filled 2020-03-22 (×2): qty 2
  Filled 2020-03-22 (×2): qty 20
  Filled 2020-03-22 (×3): qty 2
  Filled 2020-03-22: qty 20
  Filled 2020-03-22: qty 2
  Filled 2020-03-22 (×2): qty 20
  Filled 2020-03-22 (×5): qty 2
  Filled 2020-03-22: qty 20
  Filled 2020-03-22: qty 2
  Filled 2020-03-22: qty 20
  Filled 2020-03-22 (×4): qty 2
  Filled 2020-03-22: qty 20
  Filled 2020-03-22 (×7): qty 2
  Filled 2020-03-22: qty 20
  Filled 2020-03-22 (×5): qty 2
  Filled 2020-03-22 (×4): qty 20
  Filled 2020-03-22 (×4): qty 2
  Filled 2020-03-22: qty 20
  Filled 2020-03-22 (×2): qty 2
  Filled 2020-03-22: qty 20
  Filled 2020-03-22: qty 2
  Filled 2020-03-22 (×3): qty 20
  Filled 2020-03-22 (×3): qty 2

## 2020-03-22 MED ORDER — VANCOMYCIN HCL 1750 MG/350ML IV SOLN
1750.0000 mg | Freq: Once | INTRAVENOUS | Status: AC
  Administered 2020-03-22: 1750 mg via INTRAVENOUS
  Filled 2020-03-22: qty 350

## 2020-03-22 MED ORDER — POLYETHYLENE GLYCOL 3350 17 G PO PACK: 17.0000 g | PACK | Freq: Every day | ORAL | Status: DC | PRN

## 2020-03-22 MED ORDER — PROMETHAZINE HCL 12.5 MG PO TABS
12.5000 mg | ORAL_TABLET | Freq: Four times a day (QID) | ORAL | Status: DC | PRN
  Filled 2020-03-22: qty 1

## 2020-03-22 MED ORDER — MIDAZOLAM HCL 2 MG/2ML IJ SOLN
INTRAMUSCULAR | Status: AC
  Filled 2020-03-22: qty 2

## 2020-03-22 SURGICAL SUPPLY — 70 items
BIT DRILL WIRE PASS 1.3MM (BIT) IMPLANT
BLADE CLIPPER SURG (BLADE) ×3 IMPLANT
BUR CARBIDE MATCH 3.0 (BURR) ×3 IMPLANT
BUR SPIRAL ROUTER 2.3 (BUR) ×3 IMPLANT
CANISTER SUCT 3000ML PPV (MISCELLANEOUS) ×3 IMPLANT
CARTRIDGE OIL MAESTRO DRILL (MISCELLANEOUS) ×2 IMPLANT
COVER WAND RF STERILE (DRAPES) ×3 IMPLANT
DIFFUSER DRILL AIR PNEUMATIC (MISCELLANEOUS) ×3 IMPLANT
DRAIN JACKSON PRATT 1/4 1325 (MISCELLANEOUS) IMPLANT
DRAPE NEUROLOGICAL W/INCISE (DRAPES) ×3 IMPLANT
DRAPE SHEET LG 3/4 BI-LAMINATE (DRAPES) ×3 IMPLANT
DRAPE SURG 17X23 STRL (DRAPES) IMPLANT
DRAPE WARM FLUID 44X44 (DRAPES) ×3 IMPLANT
DRILL WIRE PASS 1.3MM (BIT)
DURAPREP 6ML APPLICATOR 50/CS (WOUND CARE) ×3 IMPLANT
ELECT REM PT RETURN 9FT ADLT (ELECTROSURGICAL) ×3
ELECTRODE REM PT RTRN 9FT ADLT (ELECTROSURGICAL) ×2 IMPLANT
EVACUATOR SILICONE 100CC (DRAIN) IMPLANT
FORCEPS BIPO MALIS IRRIG 9X1.5 (NEUROSURGERY SUPPLIES) ×3 IMPLANT
GAUZE 4X4 16PLY RFD (DISPOSABLE) IMPLANT
GAUZE SPONGE 4X4 12PLY STRL (GAUZE/BANDAGES/DRESSINGS) ×1 IMPLANT
GLOVE BIOGEL PI IND STRL 8 (GLOVE) ×4 IMPLANT
GLOVE BIOGEL PI INDICATOR 8 (GLOVE) ×2
GLOVE ECLIPSE 8.0 STRL XLNG CF (GLOVE) ×6 IMPLANT
GLOVE EXAM NITRILE LRG STRL (GLOVE) IMPLANT
GLOVE EXAM NITRILE XL STR (GLOVE) IMPLANT
GLOVE EXAM NITRILE XS STR PU (GLOVE) IMPLANT
GOWN STRL REUS W/ TWL LRG LVL3 (GOWN DISPOSABLE) IMPLANT
GOWN STRL REUS W/ TWL XL LVL3 (GOWN DISPOSABLE) ×2 IMPLANT
GOWN STRL REUS W/TWL 2XL LVL3 (GOWN DISPOSABLE) IMPLANT
GOWN STRL REUS W/TWL LRG LVL3 (GOWN DISPOSABLE)
GOWN STRL REUS W/TWL XL LVL3 (GOWN DISPOSABLE) ×3
HEMOSTAT POWDER KIT SURGIFOAM (HEMOSTASIS) ×3 IMPLANT
HEMOSTAT SURGICEL 2X14 (HEMOSTASIS) ×3 IMPLANT
IV NS 1000ML (IV SOLUTION) ×3
IV NS 1000ML BAXH (IV SOLUTION) ×2 IMPLANT
KIT BASIN OR (CUSTOM PROCEDURE TRAY) ×3 IMPLANT
KIT TURNOVER KIT B (KITS) ×3 IMPLANT
KIT VARIOGUIDE DRILL 1.9 (KITS) ×1 IMPLANT
MARKER SPHERE PSV REFLC 13MM (MARKER) ×9 IMPLANT
NEEDLE HYPO 22GX1.5 SAFETY (NEEDLE) ×3 IMPLANT
NS IRRIG 1000ML POUR BTL (IV SOLUTION) ×3 IMPLANT
OIL CARTRIDGE MAESTRO DRILL (MISCELLANEOUS) ×3
PACK CRANIOTOMY CUSTOM (CUSTOM PROCEDURE TRAY) ×3 IMPLANT
PATTIES SURGICAL .5 X.5 (GAUZE/BANDAGES/DRESSINGS) IMPLANT
PATTIES SURGICAL .5 X3 (DISPOSABLE) IMPLANT
PATTIES SURGICAL 1X1 (DISPOSABLE) IMPLANT
PERFORATOR LRG  14-11MM (BIT) ×3
PERFORATOR LRG 14-11MM (BIT) ×2 IMPLANT
RETRACTOR LONE STAR DISPOSABLE (INSTRUMENTS) ×6 IMPLANT
SET TUBING IRRIGATION DISP (TUBING) ×3 IMPLANT
SPONGE NEURO XRAY DETECT 1X3 (DISPOSABLE) IMPLANT
SPONGE SURGIFOAM ABS GEL 100 (HEMOSTASIS) ×3 IMPLANT
STAPLER VISISTAT 35W (STAPLE) ×3 IMPLANT
STOCKINETTE 6  STRL (DRAPES) ×3
STOCKINETTE 6 STRL (DRAPES) ×2 IMPLANT
STRIP CLOSURE SKIN 1/2X4 (GAUZE/BANDAGES/DRESSINGS) ×3 IMPLANT
SUT ETHILON 3 0 FSL (SUTURE) IMPLANT
SUT ETHILON 3 0 PS 1 (SUTURE) IMPLANT
SUT NURALON 4 0 TR CR/8 (SUTURE) ×9 IMPLANT
SUT VIC AB 0 CT1 18XCR BRD8 (SUTURE) ×2 IMPLANT
SUT VIC AB 0 CT1 8-18 (SUTURE) ×3
SUT VIC AB 2-0 CP2 18 (SUTURE) ×3 IMPLANT
SUT VICRYL RAPIDE 4/0 PS 2 (SUTURE) ×3 IMPLANT
TOWEL GREEN STERILE (TOWEL DISPOSABLE) ×3 IMPLANT
TOWEL GREEN STERILE FF (TOWEL DISPOSABLE) ×3 IMPLANT
TRAY FOLEY MTR SLVR 16FR STAT (SET/KITS/TRAYS/PACK) ×3 IMPLANT
TUBE CONNECTING 12X1/4 (SUCTIONS) ×3 IMPLANT
UNDERPAD 30X36 HEAVY ABSORB (UNDERPADS AND DIAPERS) ×3 IMPLANT
WATER STERILE IRR 1000ML POUR (IV SOLUTION) ×3 IMPLANT

## 2020-03-22 NOTE — Progress Notes (Signed)
SLP Cancellation Note  Patient Details Name: Philis Doke MRN: [id number] DOB: 26-Feb-1966   Cancelled treatment:       Reason Eval/Treat Not Completed: Patient at procedure or test/unavailable (Pt off unit for MRI. SLP will follow up. )  Nikolaus Pienta I. Vear Clock, MS, CCC-SLP Acute Rehabilitation Services Office number [phone] Pager [phone]  Scheryl Marten 03/22/2020, 8:54 AM

## 2020-03-22 NOTE — H&P (Signed)
NAME:  Vickie Alvarado, MRN:  [id number], DOB:  [birth year], LOS: 2 ADMISSION DATE:  03/20/2020, CONSULTATION DATE:  03/22/20 REFERRING MD:  Dr. 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 S/p L frontal stereotactic biopsy/aspiration of the abscess and PCCM consulted to admit.  History of present illness   Patient is a 54 y/o F with a history of HTN, HLD, COVID-19 infection (03/01/20), dental infections, and sinus infections presented to [facility], after being found altered, with R sided weakness, dysarthria, and R facial droop. Patient had decreased  P.O. intake, decreased appetite, and projectile vomiting in addition to her other symptoms. MRI brain showed a 6.5 x 4.5 x 4 cm mass in the Left basal ganglia. Neurosurgery performed a L frontal stereotactic biopsy/aspiration of the abscess and PCCM consulted for admission to neurosurgical ICU.   Past Medical History  Anxiety  Bipolar Disorder Dental infection HTN HLD  Significant Hospital Events   8/25> Admitted 8/27> Aspiration of brain abscess, admitted ICU   Consults:  PCCM  Neurosurgery   Procedures:  8/27> L frontal stereotactic biopsy/aspiration of L basal ganglia mass  Significant Diagnostic Tests:  8/25 MR Brain> 6.5x4.5x4 cm tumor in the epicenter of the L basal ganglia and radiating white matter tracts  Micro Data:  8/26 BC x2> NGTD 8/27 Gram Stain> sent 8/27 Aerobic/Anaerobe> sent 8/27 Fungus Culture with stain> Sent  8/27 Urine Culture<  <10k colonies    Antimicrobials:  8/27 Cefepime>> 8/27 8/27 Vanc>> 8/27 Metro>> 8/27 Ceftriaxone>> 8/27 Cefazolin>>8/27  Interim history/subjective:  Admission today, will start broad spectrum coverage given patient's abscess. Admitting to the Neuro ICU. Continuing  to monitor.  Objective   Blood pressure 137/78, pulse 98, temperature 98.1 F (36.7 C), resp. rate (!) 24, weight 88 kg, SpO2 100 %.        Intake/Output Summary (Last 24 hours) at 03/22/2020 1517 Last data filed at 03/22/2020 1324 Gross per 24 hour  Intake 600 ml  Output 410 ml  Net 190 ml   Filed Weights   03/22/20 1200  Weight: 88 kg    Examination: Physical Exam Constitutional:      General: She is not in acute distress.    Appearance: She is not toxic-appearing or diaphoretic.     Comments: Patient lying in bed, lethargic, NAD, not responsive to verbal stimuli, responds to physical stimuli.   HENT:     Head: Normocephalic.     Comments: Sutures in place with no erythema, purulence, or drainage, R sided loss of the labial nasal fold.     Nose: Nose normal.  Eyes:     General: No scleral icterus.       Right eye: No discharge.        Left eye: No discharge.     Conjunctiva/sclera: Conjunctivae normal.     Comments: Pupils sluggish but reactive No doll's eye reflex  Cardiovascular:     Rate and Rhythm: Normal rate and regular rhythm.     Pulses: Normal pulses.     Heart sounds: Normal heart sounds. No murmur heard.  No friction rub. No gallop.   Pulmonary:     Comments: Bronchial air sounds appreciated bilaterally.  Abdominal:     General: Abdomen is flat.  Tenderness: There is no abdominal tenderness.  Musculoskeletal:     Right lower leg: No edema.     Left lower leg: No edema.  Neurological:     Comments: Withdrawals from painful stimuli 2+ in LUE and LLE 1+ RUE and RLE     Resolved Hospital Problem list     Assessment & Plan:  54 y/o female with HTN, HLD, bipolar disorder, dental infections, and sinus infections presenting with new found brain mass. Admitted for surgical/medical management.   Brain Mass AMS Nausea/Vomiting:  Patient with new MRI findings of L basal thalamus mass with central necrosis, and mass effect concerning for brain  abscess 2/2 to history of dental/sinus infections, but also concerning for glioma/metastasis. AMS and n/v likely 2/2 to midline shift. Patient s/p neurosurgical intervention. Cultures sent. Will admit to the neurosurgical ICU and start broad antibiotic coverage. Holdings steroids in setting of brain abscess to limit possible spread of infection. Spoke with ID regarding patient's antibiotic regimen and appreciate their recommendations. .   - Frequent neuro checks - Appreciate ID's recommendations.  - F/U cultures - F/U CT Head - F/U ABG - Ceftriaxone per ID - Ordered Flagyl  - Continue Vancomycin - Trend CBCs  - Monitor Vitals  HTN:  Currently holding home medications  HLD:  Not on home statin. Given mental status and NPO, consider starting when patient mentation improves.  Bipolar Disorder:  Patient on Cogentin and Seroquel at home. Holding given current mentation.   Best practice:  Diet: NPO Pain/Anxiety/Delirium protocol (if indicated): Holding home medications DVT prophylaxis: SCDs Glucose control: SSI  Mobility: Bed rest  Code Status: FULL  Family Communication: Per primary  Disposition: ICU  Labs   CBC: Recent Labs  Lab 03/20/20 1011 03/21/20 0616 03/22/20 0411 03/22/20 1512  WBC 9.5 12.0* 13.9*  --   NEUTROABS 6.7  --   --   --   HGB 12.5 12.7 13.3 10.5*  HCT 37.6 37.4 39.0 31.0*  MCV 88.1 86.6 85.9  --   PLT 360 338 363  --     Basic Metabolic Panel: Recent Labs  Lab 03/20/20 1011 03/21/20 0616 03/22/20 0411 03/22/20 1512  NA 134* 135 134* 143  K 3.6 3.7 3.6 2.9*  CL 97* 101 103  --   CO2 24 20* 17*  --   GLUCOSE 215* 162* 173*  --   BUN 11 11 11   --   CREATININE 0.44 0.56 0.44  --   CALCIUM 9.3 9.0 8.9  --   MG  --   --  1.9  --    GFR: Estimated Creatinine Clearance: 90.6 mL/min (by C-G formula based on SCr of 0.44 mg/dL). Recent Labs  Lab 03/20/20 1011 03/21/20 0616 03/22/20 0411  WBC 9.5 12.0* 13.9*    Liver Function Tests: Recent  Labs  Lab 03/20/20 1011 03/21/20 0616  AST 11* 9*  ALT 14 12  ALKPHOS 103 93  BILITOT 0.6 0.7  PROT 7.2 6.7  ALBUMIN 3.2* 3.0*   No results for input(s): LIPASE, AMYLASE in the last 168 hours. No results for input(s): AMMONIA in the last 168 hours.  ABG    Component Value Date/Time   PHART 7.509 (H) 03/22/2020 1512   PCO2ART 21.5 (L) 03/22/2020 1512   PO2ART 131 (H) 03/22/2020 1512   HCO3 17.3 (L) 03/22/2020 1512   TCO2 18 (L) 03/22/2020 1512   ACIDBASEDEF 5.0 (H) 03/22/2020 1512   O2SAT 99.0 03/22/2020 1512     Coagulation Profile:  Recent Labs  Lab 03/20/20 1011  INR 1.2    Cardiac Enzymes: No results for input(s): CKTOTAL, CKMB, CKMBINDEX, TROPONINI in the last 168 hours.  HbA1C: No results found for: HGBA1C  CBG: Recent Labs  Lab 03/20/20 1006 03/22/20 1510  GLUCAP 220* 137*    Review of Systems:   Could not obtain given current mental status.   Past Medical History  She,  has a past medical history of Anxiety, Bipolar disorder (HCC), Dental caries, GERD (gastroesophageal reflux disease), Hypercholesterolemia, Hypertension, Seasonal allergies, Wears dentures, and Wears glasses.   Surgical History    Past Surgical History:  Procedure Laterality Date  . COLONOSCOPY    . DENTAL SURGERY     teeth extractions  . TUBAL LIGATION    . UTERINE FIBROID SURGERY       Social History   reports that she has been smoking cigarettes. She has been smoking about 0.25 packs per day. She has never used smokeless tobacco. She reports that she does not drink alcohol and does not use drugs.   Family History   Her family history is not on file.   Allergies Allergies  Allergen Reactions  . Ibuprofen Anaphylaxis     Home Medications  Prior to Admission medications   Medication Sig Start Date End Date Taking? Authorizing Provider  acetaminophen (TYLENOL) 500 MG tablet Take 1,000 mg by mouth every 6 (six) hours as needed for moderate pain or headache.   Yes  [provider]  benztropine (COGENTIN) 1 MG tablet Take 1 mg by mouth at bedtime.  01/30/20  Yes [provider]  gabapentin (NEURONTIN) 400 MG capsule Take 400 mg by mouth 3 (three) times daily.   Yes [provider]  omeprazole (PRILOSEC) 20 MG capsule Take 20 mg by mouth daily.   Yes [provider]  QUEtiapine (SEROQUEL) 25 MG tablet Take 25 mg by mouth at bedtime. Take with 800 mg to equal 825 mg at bedtime 01/30/20  Yes [provider]  QUEtiapine (SEROQUEL) 400 MG tablet Take 800 mg by mouth at bedtime. Take with the 25 mg to equal to 825 at bedtime 01/30/20  Yes [provider]  amoxicillin (AMOXIL) 500 MG capsule Take 500 mg by mouth daily.  Patient not taking: Reported on 03/20/2020    [provider]  cyclobenzaprine (FLEXERIL) 10 MG tablet Take 1 tablet (10 mg total) by mouth 2 (two) times daily as needed for muscle spasms. Patient not taking: Reported on 02/22/2020 09/25/19   Sherene Sires, PA-C   Dolan Amen, MD IMTS, PGY-2 03/22/2020,4:20 PM

## 2020-03-22 NOTE — Consult Note (Addendum)
[facility]  Total days of antibiotics 1       Reason for Consult:brain abscess    Referring Physician: thomas  Principal Problem:   Brain mass Active Problems:   Weakness of extremity   Essential hypertension   Hyperlipidemia   Anxiety   Bipolar disorder (HCC)    HPI: Vickie Alvarado is a 54 y.o. female with history of HTN, HLD, bipolar disorder. She was to undergo dental procedure on 8/6 however was found to be febrile at time of dental visit for which they did a rapid test (despite pre procedure testing being negative test on 8/4)her COVID test was positive and was given instruction to self isolate for 14 days. She was brought into ED on 8/25 since family checked in on her and found that she has signs concerning for stroke given AMS, right sided weakness with facial droop and dysarthria. Imaging showed large lesion measuring 6.5 x 4.5 x 4cm in left basal ganglia concern for infection due to dental caries/ sinusitis vs. Tumor. She underwent brain biospy/drainage on 8/27 by dr Maisie Fus. Specimens sent for aerobic/fungal/path. She was empirically started on abtx.   Past Medical History:  Diagnosis Date  . Anxiety   . Bipolar disorder (HCC)   . Dental caries    periodontal disease, exostosis left maxilla  . GERD (gastroesophageal reflux disease)   . Hypercholesterolemia   . Hypertension   . Seasonal allergies   . Wears dentures   . Wears glasses     Allergies:  Allergies  Allergen Reactions  . Ibuprofen Anaphylaxis     MEDICATIONS: . Chlorhexidine Gluconate Cloth  6 each Topical Daily  . dexamethasone (DECADRON) injection  4 mg Intravenous Q6H  . hydrALAZINE        Social History   Tobacco Use  . Smoking status: Current Every Day Smoker    Packs/day: 0.25    Types: Cigarettes  . Smokeless tobacco: Never Used  Vaping Use  . Vaping Use: Never used  Substance Use Topics  . Alcohol use: Never  . Drug use: Never    Family hx: liver  disease, and hypertension  Review of Systems -  Unable to obtain patient is solomnent  OBJECTIVE: Temp:  [97.6 F (36.4 C)-98.7 F (37.1 C)] 98.1 F (36.7 C) (08/27 1500) Pulse Rate:  [66-110] 98 (08/27 1500) Resp:  [18-25] 24 (08/27 1500) BP: (118-207)/(73-106) 137/78 (08/27 1500) SpO2:  [97 %-100 %] 100 % (08/27 1500) Arterial Line BP: (156-177)/(75-77) 177/75 (08/27 1500) Weight:  [88 kg] 88 kg (08/27 1200) Physical Exam  Constitutional:  Sleeping but has right sided facial droop. appears well-developed and well-nourished. No distress.  HENT: [place]/AT, PERRLA, no scleral icterus Mouth/Throat: Oropharynx is clear and moist. No oropharyngeal exudate.  Cardiovascular: Normal rate, regular rhythm and normal heart sounds. Exam reveals no gallop and no friction rub.  No murmur heard.  Pulmonary/Chest: Effort normal and breath sounds normal. No respiratory distress.  has no wheezes.  Neck = supple, no nuchal rigidity Abdominal: Soft. Bowel sounds are normal.  exhibits no distension. There is no tenderness.  Lymphadenopathy: no cervical adenopathy. No axillary adenopathy Neurological: right sided facial droop. Unable to do full neuro exam since sedated from surgery Skin: Skin is warm and dry. No rash noted. No erythema.    LABS: Results for orders placed or performed during the hospital encounter of 03/20/20 (from the past 48 hour(s))  Urine rapid drug screen (hosp performed)  Status: Abnormal   Collection Time: 03/20/20  4:30 PM  Result Value Ref Range   Opiates NONE DETECTED NONE DETECTED   Cocaine NONE DETECTED NONE DETECTED   Benzodiazepines NONE DETECTED NONE DETECTED   Amphetamines NONE DETECTED NONE DETECTED   Tetrahydrocannabinol POSITIVE (A) NONE DETECTED   Barbiturates NONE DETECTED NONE DETECTED    Comment: (NOTE) DRUG SCREEN FOR MEDICAL PURPOSES ONLY.  IF CONFIRMATION IS NEEDED FOR ANY PURPOSE, NOTIFY LAB WITHIN 5 DAYS.  LOWEST DETECTABLE LIMITS FOR URINE DRUG  SCREEN Drug Class                     Cutoff (ng/mL) Amphetamine and metabolites    1000 Barbiturate and metabolites    200 Benzodiazepine                 200 Tricyclics and metabolites     300 Opiates and metabolites        300 Cocaine and metabolites        300 THC                            50 Performed at [facility], [address] [place], [place] [address]   Urinalysis, Routine w reflex microscopic     Status: Abnormal   Collection Time: 03/20/20  4:30 PM  Result Value Ref Range   Color, Urine AMBER (A) YELLOW    Comment: BIOCHEMICALS MAY BE AFFECTED BY COLOR   APPearance CLOUDY (A) CLEAR   Specific Gravity, Urine >1.046 (H) 1.005 - 1.030   pH 6.0 5.0 - 8.0   Glucose, UA 50 (A) NEGATIVE mg/dL   Hgb urine dipstick NEGATIVE NEGATIVE   Bilirubin Urine NEGATIVE NEGATIVE   Ketones, ur NEGATIVE NEGATIVE mg/dL   Protein, ur 604100 (A) NEGATIVE mg/dL   Nitrite NEGATIVE NEGATIVE   Leukocytes,Ua LARGE (A) NEGATIVE   RBC / HPF 0-5 0 - 5 RBC/hpf   WBC, UA 21-50 0 - 5 WBC/hpf   Bacteria, UA MANY (A) NONE SEEN   Squamous Epithelial / LPF 11-20 0 - 5   Mucus PRESENT    Trichomonas, UA PRESENT (A) NONE SEEN    Comment: Performed at [facility], [address] [place], [place] [address]  HIV Antibody (routine testing w rflx)     Status: None   Collection Time: 03/21/20  6:16 AM  Result Value Ref Range   HIV Screen 4th Generation wRfx Non Reactive Non Reactive    Comment: Performed at [facility], [address] [place], [place] [address]  Culture, blood (routine x 2)     Status: None (Preliminary result)   Collection Time: 03/21/20  6:16 AM   Specimen: BLOOD  Result Value Ref Range   Specimen Description BLOOD BLOOD RIGHT HAND    Special Requests      AEROBIC BOTTLE ONLY Blood Culture results may not be optimal due to an inadequate volume of blood received in culture bottles   Culture      NO GROWTH 1 DAY Performed at [facility], [address] [place], [place] [address]    Report Status PENDING   Culture, blood (routine x 2)     Status: None (Preliminary result)   Collection Time: 03/21/20  6:16 AM   Specimen: BLOOD  Result Value Ref Range   Specimen Description BLOOD RIGHT ANTECUBITAL    Special Requests  AEROBIC BOTTLE ONLY Blood Culture results may not be optimal due to an inadequate volume of blood received in culture bottles   Culture      NO GROWTH 1 DAY Performed at [facility], [address] [place], [place] [address]    Report Status PENDING   CBC     Status: Abnormal   Collection Time: 03/21/20  6:16 AM  Result Value Ref Range   WBC 12.0 (H) 4.0 - 10.5 K/uL   RBC 4.32 3.87 - 5.11 MIL/uL   Hemoglobin 12.7 12.0 - 15.0 g/dL   HCT 64.3 36 - 46 %   MCV 86.6 80.0 - 100.0 fL   MCH 29.4 26.0 - 34.0 pg   MCHC 34.0 30.0 - 36.0 g/dL   RDW 32.9 51.8 - 84.1 %   Platelets 338 150 - 400 K/uL   nRBC 0.0 0.0 - 0.2 %    Comment: Performed at [facility], [address], [place], [place] [address]  Comprehensive metabolic panel     Status: Abnormal   Collection Time: 03/21/20  6:16 AM  Result Value Ref Range   Sodium 135 135 - 145 mmol/L   Potassium 3.7 3.5 - 5.1 mmol/L   Chloride 101 98 - 111 mmol/L   CO2 20 (L) 22 - 32 mmol/L   Glucose, Bld 162 (H) 70 - 99 mg/dL    Comment: Glucose reference range applies only to samples taken after fasting for at least 8 hours.   BUN 11 6 - 20 mg/dL   Creatinine, Ser 0.16 0.44 - 1.00 mg/dL   Calcium 9.0 8.9 - 01.0 mg/dL   Total Protein 6.7 6.5 - 8.1 g/dL   Albumin 3.0 (L) 3.5 - 5.0 g/dL   AST 9 (L) 15 - 41 U/L   ALT 12 0 - 44 U/L   Alkaline Phosphatase 93 38 - 126 U/L   Total Bilirubin 0.7 0.3 - 1.2 mg/dL   GFR calc non Af Amer >60 >60 mL/min   GFR calc Af Amer >60 >60 mL/min   Anion gap 14 5 - 15    Comment: Performed at [facility], [address], [place], [place] [address]  Culture, Urine     Status: Abnormal   Collection Time: 03/21/20  4:35 PM    Specimen: Urine, Random  Result Value Ref Range   Specimen Description URINE, RANDOM    Special Requests NONE    Culture (A)     <10,000 COLONIES/mL INSIGNIFICANT GROWTH Performed at [facility], [address] [place], [place] [address]    Report Status 03/22/2020 FINAL   Basic metabolic panel     Status: Abnormal   Collection Time: 03/22/20  4:11 AM  Result Value Ref Range   Sodium 134 (L) 135 - 145 mmol/L   Potassium 3.6 3.5 - 5.1 mmol/L   Chloride 103 98 - 111 mmol/L   CO2 17 (L) 22 - 32 mmol/L   Glucose, Bld 173 (H) 70 - 99 mg/dL    Comment: Glucose reference range applies only to samples taken after fasting for at least 8 hours.   BUN 11 6 - 20 mg/dL   Creatinine, Ser 0.25 0.44 - 1.00 mg/dL   Calcium 8.9 8.9 - 42.7 mg/dL   GFR calc non Af Amer >60 >60 mL/min   GFR calc Af Amer >60 >60 mL/min   Anion gap 14 5 - 15    Comment: Performed at [facility], [address], [place], [place] [address]  CBC     Status: Abnormal   Collection Time: 03/22/20  4:11 AM  Result Value Ref Range   WBC 13.9 (H) 4.0 - 10.5 K/uL   RBC 4.54 3.87 - 5.11 MIL/uL   Hemoglobin 13.3 12.0 - 15.0 g/dL   HCT 91.4 36 - 46 %   MCV 85.9 80.0 - 100.0 fL   MCH 29.3 26.0 - 34.0 pg   MCHC 34.1 30.0 - 36.0 g/dL   RDW 78.2 95.6 - 21.3 %   Platelets 363 150 - 400 K/uL   nRBC 0.0 0.0 - 0.2 %    Comment: Performed at [facility], [address], [place], [place] [address]  Magnesium     Status: None   Collection Time: 03/22/20  4:11 AM  Result Value Ref Range   Magnesium 1.9 1.7 - 2.4 mg/dL    Comment: Performed at [facility], [address] [place], [place] [address]  Glucose, capillary     Status: Abnormal   Collection Time: 03/22/20  3:10 PM  Result Value Ref Range   Glucose-Capillary 137 (H) 70 - 99 mg/dL    Comment: Glucose reference range applies only to samples taken after fasting for at least 8 hours.  I-STAT 7, (LYTES, BLD GAS, ICA, H+H)     Status: Abnormal    Collection Time: 03/22/20  3:12 PM  Result Value Ref Range   pH, Arterial 7.509 (H) 7.35 - 7.45   pCO2 arterial 21.5 (L) 32 - 48 mmHg   pO2, Arterial 131 (H) 83 - 108 mmHg   Bicarbonate 17.3 (L) 20.0 - 28.0 mmol/L   TCO2 18 (L) 22 - 32 mmol/L   O2 Saturation 99.0 %   Acid-base deficit 5.0 (H) 0.0 - 2.0 mmol/L   Sodium 143 135 - 145 mmol/L   Potassium 2.9 (L) 3.5 - 5.1 mmol/L   Calcium, Ion 1.05 (L) 1.15 - 1.40 mmol/L   HCT 31.0 (L) 36 - 46 %   Hemoglobin 10.5 (L) 12.0 - 15.0 g/dL   Patient temperature 29.5 C    Sample type ARTERIAL     MICRO:  IMAGING: CT HEAD WO CONTRAST  Result Date: 03/22/2020 CLINICAL DATA:  Follow-up brain biopsy. EXAM: CT HEAD WITHOUT CONTRAST TECHNIQUE: Contiguous axial images were obtained from the base of the skull through the vertex without intravenous contrast. COMPARISON:  MRI earlier same day.  CT yesterday. FINDINGS: Brain: Left frontal burr hole. Biopsy site with air or Gel-Foam in the region of the previous tumor with the epicenter in the left basal ganglia. No evidence of postsurgical hemorrhage. No change in regional mass effect with left-to-right shift 11 mm. No extra-axial collection. Vascular: No abnormal vascular finding. Skull: Otherwise negative Sinuses/Orbits: Ostiomeatal unit pattern of the paranasal sinuses on the right. Other: None IMPRESSION: Biopsy site with air or Gel-Foam in the region of the previous tumor with the epicenter in the left basal ganglia. No evidence of postsurgical hemorrhage. No change in regional mass effect with left-to-right shift 11 mm. Electronically Signed   By: Paulina Fusi M.D.   On: 03/22/2020 15:58   CT HEAD WO CONTRAST  Result Date: 03/21/2020 CLINICAL DATA:  Brain mass. EXAM: CT HEAD WITHOUT CONTRAST TECHNIQUE: Contiguous axial images were obtained from the base of the skull through the vertex without intravenous contrast. COMPARISON:  Brain MRI from yesterday FINDINGS: Brain: Known mass with central low-density  and extensive hemispheric low-density and  swelling centered in the deep left cerebrum. Dimensions were better assessed on postcontrast imaging from yesterday. No interval hemorrhage or herniation. Midline shift measures 1 cm without entrapment. Vascular: Negative Skull: Negative Sinuses/Orbits: Chronic right-sided sinusitis with obstruction at the level of the middle meatus. Other: Motion artifact IMPRESSION: 1. BrainLAB protocol but motion artifact at the vertex and skull base due to confusion. If not adequate for intraoperative localization a sedated scan may be required. 2. No new findings when compared to preceding brain MRI. Electronically Signed   By: Marnee Spring M.D.   On: 03/21/2020 11:32   MR BRAIN W WO CONTRAST  Result Date: 03/22/2020 CLINICAL DATA:  Brain mass EXAM: MRI HEAD WITHOUT AND WITH CONTRAST TECHNIQUE: Multiplanar, multiecho pulse sequences of the brain and surrounding structures were obtained without and with intravenous contrast. CONTRAST:  8mL GADAVIST GADOBUTROL 1 MMOL/ML IV SOLN COMPARISON:  03/20/2020 FINDINGS: Brain: When accounting for differences in technique (axial scan angle is slightly different), similar size of the mass centered within the left basal ganglia, with the enhancing portion measuring up to approximately 6.4 x 2.8 by 3.6 cm. Redemonstrated nodular peripheral enhancement with central necrosis. Similar exuberant surrounding T2/stir hyperintensity with marked effacement left lateral ventricle and approximately 9 mm of rightward midline shift at the foramina March ARB. No progressive ventriculomegaly. The mass restricts diffusion peripherally without central restricted diffusion. Additionally, there is a new area of restricted diffusion within the right thalamus, compatible with acute infarct (series 550, image 28). Mild associated T2/STIR hyperintensity. Vascular: Limited evaluation; however, major vessels at the base of the brain demonstrate normal flow voids. Skull  and upper cervical spine: Negative Sinuses/Orbits: There is complete opacification of the right frontal, anterior ethmoid air cells, and right maxillary sinus, compatible with ostiomeatal unit pattern sinus disease. Other: Small left mastoid effusion. IMPRESSION: 1. New/acute infarct within the right thalamus. 2. 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 Resulting effacement of the left lateral ventricle and approximately 9 mm of right midline shift, not substantially changed. No progressive ventriculomegaly. 3. Similar right-sided ostiomeatal unit pattern paranasal sinusitis. Critical Value/emergent results were called by telephone at the time of interpretation on 03/22/2020 at 10:35 am to provider Dr. Jake Samples, who verbally acknowledged these results. Electronically Signed   By: Feliberto Harts MD   On: 03/22/2020 10:39   CT CHEST ABDOMEN PELVIS W CONTRAST  Result Date: 03/20/2020 CLINICAL DATA:  54 year old female with brain mass. Evaluate for metastatic disease. EXAM: CT CHEST, ABDOMEN, AND PELVIS WITH CONTRAST TECHNIQUE: Multidetector CT imaging of the chest, abdomen and pelvis was performed following the standard protocol during bolus administration of intravenous contrast. CONTRAST:  OMNIPAQUE IOHEXOL 350 MG/ML SOLN COMPARISON:  None. FINDINGS: CT CHEST FINDINGS Cardiovascular: There is no cardiomegaly or pericardial effusion. There is noncalcified plaque along the thoracic aorta and aortic arch. No aneurysmal dilatation or dissection. The origins of the great vessels of the aortic arch appear patent. The central pulmonary arteries are unremarkable for the degree of opacification. Mediastinum/Nodes: Top-normal right hilar lymph nodes. No adenopathy. The esophagus and the thyroid gland are  grossly unremarkable. No mediastinal fluid collection. Lungs/Pleura: There is a patchy area of consolidation with air bronchograms involving the right lower lobe which may represent atelectasis but concerning for pneumonia. Aspiration is not excluded. Clinical correlation and follow-up to resolution recommended to exclude an underlying  mass. Faint left lung base linear and platelike atelectasis noted. There is no pleural effusion or pneumothorax. The central airways are patent. Musculoskeletal: No acute osseous pathology. CT ABDOMEN PELVIS FINDINGS No intra-abdominal free air. There is a small free fluid in the pelvis. Hepatobiliary: The liver is unremarkable. No intrahepatic biliary ductal dilatation. The gallbladder is unremarkable. Pancreas: Unremarkable. No pancreatic ductal dilatation or surrounding inflammatory changes. Spleen: Normal in size without focal abnormality. Adrenals/Urinary Tract: The adrenal glands are unremarkable. There is no hydronephrosis on either side. There is symmetric enhancement and excretion of contrast by both kidneys. The visualized ureters and urinary bladder appear unremarkable. Stomach/Bowel: There is moderate stool throughout the colon. There is no bowel obstruction or active inflammation. The appendix is normal. Vascular/Lymphatic: The abdominal aorta and IVC are unremarkable. No portal venous gas. There is no adenopathy. Reproductive: Hysterectomy. There is a 9 mm calcific focus in the right posterior pelvis which is not evaluated but may be associated with the ovary. Pelvic ultrasound may provide better evaluation. Other: Anterior pelvic wall surgical scar.  No fluid collection. Musculoskeletal: Degenerative changes of the spine. No acute osseous pathology. IMPRESSION: 1. Right lower lobe consolidation concerning for pneumonia. Aspiration is not excluded. Clinical correlation and follow-up to resolution recommended to exclude an underlying mass. 2. No evidence of metastatic  disease in the chest, abdomen or pelvis. 3. An indeterminate 9 mm calcific focus in the right posterior pelvis may be associated with the ovary. Pelvic ultrasound may provide better evaluation. 4. Aortic Atherosclerosis (ICD10-I70.0). Electronically Signed   By: Elgie Collard M.D.   On: 03/20/2020 19:52   Assessment/Plan:  54yo F with presentation of right sided weakness, dysarthria concerning for stroke found to have left basal ganglia mass concerning for brain abscess - recommend to keep on vancomycin and will change cefepime to ceftriaxone plus iv metronidazole - will follow culture results to fine tune her antimicrobial regimen - while on vancomycin, will need trough to renally adjust medications if needed - continue with neuro checks  - can get picc line placement  Poor po intake/malnutrition = will reassess over the weekend for ng vs speech evaluation  Pneumonia on Chest CT = would be covered with this abtx regimen for aspiration vs sequelae from having covid -19 just over 2 wks ago.  recoveredcovid = she was to get her vaccine at the time of her diagnosis. Recommend to get vaccine by mid september

## 2020-03-22 NOTE — Progress Notes (Signed)
eLink Physician-Brief Progress Note Patient Name: Vickie Alvarado DOB: 1965/08/25 MRN: [id number]   Date of Service  03/22/2020  HPI/Events of Note  Patient with a change in mental status, neurosurgery concerned that patient is no longer protecting her airway and wants PCCM to evaluate her for intubation.  eICU Interventions  Will ask PCCM bedside to see patient.        Thomasene Lot Tatia Petrucci 03/22/2020, 11:53 PM

## 2020-03-22 NOTE — Progress Notes (Signed)
SLP Cancellation Note  Patient Details Name: Vickie Alvarado MRN: [id number] DOB: 09/29/1965   Cancelled treatment:       Reason Eval/Treat Not Completed: Patient at procedure or test/unavailable (Pt in OR for stereotactic biopsy. SLP will follow up on subsequent date.)  Esti Demello I. Vear Clock, MS, CCC-SLP Acute Rehabilitation Services Office number [phone] Pager [phone]  Scheryl Marten 03/22/2020, 1:28 PM

## 2020-03-22 NOTE — Anesthesia Procedure Notes (Signed)
Procedure Name: Intubation Date/Time: 03/22/2020 12:16 PM Performed by: Trinna Post., CRNA Pre-anesthesia Checklist: Patient identified, Emergency Drugs available, Suction available, Patient being monitored and Timeout performed Patient Re-evaluated:Patient Re-evaluated prior to induction Oxygen Delivery Method: Circle system utilized Preoxygenation: Pre-oxygenation with 100% oxygen Induction Type: IV induction Ventilation: Mask ventilation without difficulty Laryngoscope Size: Mac and 3 Grade View: Grade I Tube type: Oral Tube size: 7.0 mm Number of attempts: 1 Airway Equipment and Method: Stylet Placement Confirmation: ETT inserted through vocal cords under direct vision,  positive ETCO2 and breath sounds checked- equal and bilateral Secured at: 22 cm Tube secured with: Tape Dental Injury: Teeth and Oropharynx as per pre-operative assessment

## 2020-03-22 NOTE — Anesthesia Postprocedure Evaluation (Signed)
Anesthesia Post Note  Patient: Vickie Alvarado  Procedure(s) Performed: FRAMELESS BIOPSY WITH BRAINLAB (Left Head) APPLICATION OF CRANIAL NAVIGATION (N/A Head)     Patient location during evaluation: PACU Anesthesia Type: General Level of consciousness: lethargic Pain management: pain level controlled Vital Signs Assessment: post-procedure vital signs reviewed and stable Respiratory status: spontaneous breathing, nonlabored ventilation, respiratory function stable and patient connected to nasal cannula oxygen Cardiovascular status: blood pressure returned to baseline and stable Postop Assessment: no apparent nausea or vomiting Anesthetic complications: no   No complications documented.  Last Vitals:  Vitals:   03/22/20 1454 03/22/20 1500  BP: (!) 207/88 137/78  Pulse:  98  Resp:  (!) 24  Temp:  36.7 C  SpO2:  100%    Last Pain:  Vitals:   03/22/20 1345  TempSrc:   PainSc: Asleep                 Kennieth Rad

## 2020-03-22 NOTE — Progress Notes (Signed)
Pharmacy Antibiotic Note  Vickie Alvarado is a 54 y.o. female admitted on 03/20/2020 with brain abscess.  Pharmacy has been consulted for vancomycin dosing.  Plan: Vancomycin IV loading dose 1750mg  once followed by Vancomycin 1000 IV every 8 hours.  Goal trough 15-20 mcg/mL.  Weight: 88 kg (194 lb 0.1 oz)   Temp (24hrs), Avg:98.1 F (36.7 C), Min:97.6 F (36.4 C), Max:98.7 F (37.1 C)  Recent Labs  Lab 03/20/20 1011 03/21/20 0616 03/22/20 0411  WBC 9.5 12.0* 13.9*  CREATININE 0.44 0.56 0.44    Estimated Creatinine Clearance: 90.6 mL/min (by C-G formula based on SCr of 0.44 mg/dL).    Allergies  Allergen Reactions  . Ibuprofen Anaphylaxis    Antimicrobials this admission: Cefepime 8/27 >> 8/27 Ceftriaxone 8/27 >> Vancomycin 8/27 >> Metronidazole 8/27 >>  Microbiology results: 8/26 BCx: No growth to date 8/26 UCx: insignificant growth 8/27 abscess: pending 8/27 MRSA PCR: pending  Thank you for allowing pharmacy to be a part of this patient's care.  9/27, PharmD PGY1 Pharmacy Resident 03/22/2020 4:32 PM

## 2020-03-22 NOTE — Progress Notes (Signed)
   Providing Compassionate, Quality Care - Together  NEUROSURGERY PROGRESS NOTE   S: seen in pacu  O: EXAM:  BP (!) 118/100 (BP Location: Left Arm)   Pulse (!) 110 Comment: CRNA giving esmolol  Temp 97.6 F (36.4 C)   Resp (!) 25   Wt 88 kg   SpO2 99%   BMI 30.39 kg/m   Eyes open to pain Extubated PERRL Face symmetric Localizes on L side, WD to pain on right Incision c.d.i Not following comands  ASSESSMENT:  54 y.o. female with   1. Left frontal abscess with mass effect  -s/p left frontal stereotactic biopsy/aspiration of abscess 03/22/2020  PLAN: - abx per ID -pain control -NSICU -CT from pacu to icu -ivf -scds -mri tomorrow    Thank you for allowing me to participate in this patient's care.  Please do not hesitate to call with questions or concerns.   Monia Pouch, DO Neurosurgeon [facility] Cell: [phone]

## 2020-03-22 NOTE — Progress Notes (Signed)
   Providing Compassionate, Quality Care - Together  Patient taken down for stat head CT due to mental status change. New scan is stable in comparison to CT at 1549. Patient's decadron dose modified and patient started on hypertonic saline per protocol. Plan is for PICC line placement tomorrow. SSI adjusted to resistant scale. Spoke with Dr. Warrick Parisian of [facility] regarding moving forward with intubation for airway protection. He will have someone come by to further assess the patient's airway.    Val Eagle, DNP, AGNP-C Nurse Practitioner 03/23/2020 11:53 PM     [facility] [address], [place], [place] [address] P: [phone]    F: [phone]

## 2020-03-22 NOTE — Progress Notes (Signed)
I called the daughter, I discussed all the risks and benefits of the stereotactic biopsy.  She understands that we do not know the diagnosis at that time and further treatment will be guided based off the pathology.  She understands that there are risks to any procedure including but not limited to hemorrhage, swelling, need for more surgery, bleeding and infection.  She is going to come tomorrow morning in order to sign the consent for surgery.  She understands that once we have the preliminary diagnosis we can begin treating if this is an abscess or some other type of autoimmune lesion.  If this is an abscess we will drain as much as we can through the needle biopsy.  She did recently get diagnosed with Covid therefore I told her that we cannot rule out any type of abnormality that could be related to that although I have not seen any reports at this time of similar findings.

## 2020-03-22 NOTE — Progress Notes (Signed)
Subjective: Patient is lethargic therefore history and information is obtained from the RN and chart  Objective: Eyes open to pain Pupils equal round reactive to light Right sided weakness, with drawls to pain in upper and lower Left side moving spontaneously and localized pain   A/P:  54 year old female with  1. Large left frontal ring enhancing mass, concern for abscess versus glioma  -s teroids for mass effect -hold abx until biopsy -neuro checks -biopsy planned tomorrow  -will call daughter to discuss

## 2020-03-22 NOTE — Progress Notes (Signed)
PROGRESS NOTE  Vickie ButtnerLana White Alvarado ZOX:[id number] DOB: 01/04/1966 DOA: 03/20/2020 PCP: [facility], [facility]   LOS: 2 days   Brief narrative: As per HPI,  Vickie ButtnerLana White Alvarado is a 54 y.o. female with past medical history of hypertension, hyperlipidemia, GERD, bipolar disorder and recent Covid-19 infection diagnosed 03/01/2020 was isolating herself from the rest of the family members.  She lives at home with 2 of her sons.  Family checked in on her on the day of admission and found that she was altered with right-sided weakness, right facial droop and dysarthria.  Patient had been having decreased p.o. intake, decreased appetite with projectile vomiting nausea and right-sided weakness. History was obtained from the patient's daughter and ER records.  Patient was aphasic on presentation.  In the ED, Sodium 134 potassium 3.6 BUN 11 creatinine 0.44 white count 9.5.  Initially code stroke was initiated.  MRI of the brain showed 6.5 x 4.5 x 4 cm tumor in the left basal ganglia.  She was then admitted to the hospital for further evaluation and treatment.  Assessment/Plan:   Principal Problem:   Brain mass Active Problems:   Weakness of extremity   Essential hypertension   Hyperlipidemia   Anxiety   Bipolar disorder (HCC)   Brain mass- MRI brain was performed in the ED due to focal weakness which showed 6.5 x 4.5 x 4 cm lesion with the epicenter in the left basal ganglia and radiating white matter tracts. Central necrosis. Contrast enhancement along the margins of the necrosis. The enhancing region measured 6 x 2.4 x 3.5 cm. There is mass effect with flattening of the left lateral ventricle. Left-to-right midline shift of 6 mm.  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  Neurosurgery was consulted and I spoke with neurosurgery yesterday, plan is biopsy today..  Initially received Decadron which has been discontinued.  There is possibility of brain abscess due to  history of dental infection, sinus issues.  Blood cultures negative so far in one day.   Mild leukocytosis noted at 13.9 <12.0.  On Keppra empirically.  T-max of 99.1 F.  HIV has been sent. Will communicate with the neurosurgical team today.   MRI with contrast has been requested again but pending due to uncoperativeness.  Seen by speech therapy and recommend NPO. Continue on IV fluids.    Altered mental status, lethargy likely from brain mass. .   Right-sided paranasal sinusitis with ostiomeatal unit pattern.    Intractable nausea, vomiting due to brain mass.  Continue IV fluids, PRN antiemetics.  Urine pregnancy test was negative.   Cannabinoid use disorder.  UDS positive for THC.  Abnormal urinalysis.  Urine culture ordered.  Essential hypertension.  NPO status. PRN hydralazine.   History of hyperlipidemia.  Not on any medications at home.  History of bipolar disorder.  On Seroquel, Cogentin at home.  Hold off for now due to altered mental status.  DVT prophylaxis: SCD's Start: 03/22/20 0715 SCDs Start: 03/20/20 1640   Code Status: Full code  Family Communication:  Spoke with the patient's daughter at bedside  Status is: Inpatient  Remains inpatient appropriate because:Unsafe d/c plan, Inpatient level of care appropriate due to severity of illness and Neurosurgery input, possible need for biopsy   Dispo: The patient is from: Home              Anticipated d/c is to: Undertermined              Anticipated d/c  date is: 3 days or more              Patient currently is not medically stable to d/c.  Consultants:  Neurosurgery  Procedures:  None so far  Antibiotics:  . None  Anti-infectives (From admission, onward)   None     Subjective: Today, seen before biopsy. Daughter at bedside. Patient is noncommunicative, appears somnolent  Objective: Vitals:   03/22/20 0748 03/22/20 0820  BP: (!) 179/74 (!) 169/97  Pulse: 80 76  Resp: (!) 24 18  Temp: 98.3 F (36.8  C) 98.3 F (36.8 C)  SpO2: 99% 99%    Intake/Output Summary (Last 24 hours) at 03/22/2020 0829 Last data filed at 03/21/2020 1654 Gross per 24 hour  Intake 1359.76 ml  Output 100 ml  Net 1259.76 ml   There were no vitals filed for this visit. There is no height or weight on file to calculate BMI.   Physical Exam: GENERAL: Patient is somnolent, agitated at times, noncommunicative, Not in obvious distress. HENT: No scleral pallor or icterus. Pupils equally reactive to light. Oral mucosa is moist NECK: is supple, no gross swelling noted. CHEST: Clear to auscultation. No crackles or wheezes.  Diminished breath sounds bilaterally. CVS: S1 and S2 heard, no murmur. Regular rate and rhythm.  ABDOMEN: Soft, non-tender, bowel sounds are present. EXTREMITIES: No edema. CNS: Right facial palsy, 2/ 5 right upper extremity and 3/ 5 right lower extremity weakness, somnolent SKIN: warm and dry without rashes.  Data Review: I have personally reviewed the following laboratory data and studies,   CBC: Recent Labs  Lab 09-Apr-2020 1011 03/21/20 0616 03/22/20 0411  WBC 9.5 12.0* 13.9*  NEUTROABS 6.7  --   --   HGB 12.5 12.7 13.3  HCT 37.6 37.4 39.0  MCV 88.1 86.6 85.9  PLT 360 338 363   Basic Metabolic Panel: Recent Labs  Lab 2020/04/09 1011 03/21/20 0616 03/22/20 0411  NA 134* 135 134*  K 3.6 3.7 3.6  CL 97* 101 103  CO2 24 20* 17*  GLUCOSE 215* 162* 173*  BUN 11 11 11   CREATININE 0.44 0.56 0.44  CALCIUM 9.3 9.0 8.9  MG  --   --  1.9   Liver Function Tests: Recent Labs  Lab 09-Apr-2020 1011 03/21/20 0616  AST 11* 9*  ALT 14 12  ALKPHOS 103 93  BILITOT 0.6 0.7  PROT 7.2 6.7  ALBUMIN 3.2* 3.0*   No results for input(s): LIPASE, AMYLASE in the last 168 hours. No results for input(s): AMMONIA in the last 168 hours. Cardiac Enzymes: No results for input(s): CKTOTAL, CKMB, CKMBINDEX, TROPONINI in the last 168 hours. BNP (last 3 results) No results for input(s): BNP in the last  8760 hours.  ProBNP (last 3 results) No results for input(s): PROBNP in the last 8760 hours.  CBG: Recent Labs  Lab April 09, 2020 1006  GLUCAP 220*   Recent Results (from the past 240 hour(s))  SARS CORONAVIRUS 2 (TAT 6-24 HRS) Nasopharyngeal Nasopharyngeal Swab     Status: None   Collection Time: 03/14/20  2:49 PM   Specimen: Nasopharyngeal Swab  Result Value Ref Range Status   SARS Coronavirus 2 NEGATIVE NEGATIVE Final    Comment: (NOTE) SARS-CoV-2 target nucleic acids are NOT DETECTED.  The SARS-CoV-2 RNA is generally detectable in upper and lower respiratory specimens during the acute phase of infection. Negative results do not preclude SARS-CoV-2 infection, do not rule out co-infections with other pathogens, and should not be used as  the sole basis for treatment or other patient management decisions. Negative results must be combined with clinical observations, patient history, and epidemiological information. The expected result is Negative.  Fact Sheet for Patients: HairSlick.no  Fact Sheet for Healthcare Providers: quierodirigir.com  This test is not yet approved or cleared by the [place] FDA and  has been authorized for detection and/or diagnosis of SARS-CoV-2 by FDA under an Emergency Use Authorization (EUA). This EUA will remain  in effect (meaning this test can be used) for the duration of the COVID-19 declaration under Se ction 564(b)(1) of the Act, 21 U.S.C. section 360bbb-3(b)(1), unless the authorization is terminated or revoked sooner.  Performed at [facility], [address], [place], [place] [address]   Culture, blood (routine x 2)     Status: None (Preliminary result)   Collection Time: 03/21/20  6:16 AM   Specimen: BLOOD  Result Value Ref Range Status   Specimen Description BLOOD BLOOD RIGHT HAND  Final   Special Requests   Final    AEROBIC BOTTLE ONLY Blood Culture results may not  be optimal due to an inadequate volume of blood received in culture bottles   Culture   Final    NO GROWTH 1 DAY Performed at [facility], [address] [place], [place] [address]    Report Status PENDING  Incomplete  Culture, blood (routine x 2)     Status: None (Preliminary result)   Collection Time: 03/21/20  6:16 AM   Specimen: BLOOD  Result Value Ref Range Status   Specimen Description BLOOD RIGHT ANTECUBITAL  Final   Special Requests   Final    AEROBIC BOTTLE ONLY Blood Culture results may not be optimal due to an inadequate volume of blood received in culture bottles   Culture   Final    NO GROWTH 1 DAY Performed at [facility], [address] [place], [place] [address]    Report Status PENDING  Incomplete     Studies: CT Angio Head W/Cm &/Or Wo Cm  Result Date: 03/20/2020 CLINICAL DATA:  Acute neuro deficit with right-sided weakness. EXAM: CT ANGIOGRAPHY HEAD AND NECK TECHNIQUE: Multidetector CT imaging of the head and neck was performed using the standard protocol during bolus administration of intravenous contrast. Multiplanar CT image reconstructions and MIPs were obtained to evaluate the vascular anatomy. Carotid stenosis measurements (when applicable) are obtained utilizing NASCET criteria, using the distal internal carotid diameter as the denominator. CONTRAST:  44mL OMNIPAQUE IOHEXOL 350 MG/ML SOLN COMPARISON:  None. FINDINGS: CT HEAD FINDINGS Brain: Large mass lesion in the left frontal lobe. Delayed imaging reveals irregular enhancement of the periphery with central necrosis. The enhancing component of the mass measures approximately 53 x 28 mm. Surrounding low-density edema/tumor seen around the enhancement. Low-density extends into the left basal ganglia. There is compression of the left lateral ventricle and 7 mm midline shift to the right. No acute hemorrhage. Negative for hydrocephalus Vascular: Negative for hyperdense vessel Skull: Negative Sinuses:  Complete opacification right maxillary sinus with bony thickening. Mucosal edema extends into the right frontal and right ethmoid sinus. Remaining sinuses clear. Mastoid clear bilaterally. Orbits: Negative Review of the MIP images confirms the above findings CTA NECK FINDINGS Aortic arch: Normal aortic arch. Azygos branching pattern. Proximal great vessels widely patent. Right carotid system: Right carotid widely patent. Mild noncalcified plaque in the right carotid bulb. Left carotid system: Left carotid widely patent. No significant stenosis or atherosclerotic disease. Vertebral arteries:  Both vertebral arteries are widely patent to the basilar without stenosis. Skeleton: Disc degeneration and spurring C5-6 and C6-7. No acute skeletal abnormality. Poor dentition. Other neck: Negative for mass or adenopathy. Upper chest: Lung apices clear bilaterally Review of the MIP images confirms the above findings CTA HEAD FINDINGS Anterior circulation: Mild atherosclerotic calcification in the cavernous carotid without stenosis. Right middle cerebral artery widely patent. Hypoplastic right A1 segment. Azygos anterior cerebral artery arising from the left without significant stenosis. There is displacement and stretching of the left MCA branches due to the large left frontal mass. Posterior circulation: Both vertebral arteries patent to the basilar. PICA patent bilaterally. Basilar widely patent. AICA, superior cerebellar, and posterior cerebral arteries patent bilaterally without stenosis. Venous sinuses: Normal venous enhancement Anatomic variants: None Review of the MIP images confirms the above findings IMPRESSION: 1. Large mass lesion left frontal lobe compatible with tumor. Favor glioblastoma. 2. Negative for acute infarct or hemorrhage 3. No significant carotid or vertebral artery stenosis in the neck. 4. Negative for intracranial large vessel occlusion. Electronically Signed   By: Franchot Gallo M.D.   On: 03/20/2020  13:29   CT HEAD WO CONTRAST  Result Date: 03/21/2020 CLINICAL DATA:  Brain mass. EXAM: CT HEAD WITHOUT CONTRAST TECHNIQUE: Contiguous axial images were obtained from the base of the skull through the vertex without intravenous contrast. COMPARISON:  Brain MRI from yesterday FINDINGS: Brain: Known mass with central low-density and extensive hemispheric low-density and swelling centered in the deep left cerebrum. Dimensions were better assessed on postcontrast imaging from yesterday. No interval hemorrhage or herniation. Midline shift measures 1 cm without entrapment. Vascular: Negative Skull: Negative Sinuses/Orbits: Chronic right-sided sinusitis with obstruction at the level of the middle meatus. Other: Motion artifact IMPRESSION: 1. BrainLAB protocol but motion artifact at the vertex and skull base due to confusion. If not adequate for intraoperative localization a sedated scan may be required. 2. No new findings when compared to preceding brain MRI. Electronically Signed   By: Monte Fantasia M.D.   On: 03/21/2020 11:32   CT Angio Neck W and/or Wo Contrast  Result Date: 03/20/2020 CLINICAL DATA:  Acute neuro deficit with right-sided weakness. EXAM: CT ANGIOGRAPHY HEAD AND NECK TECHNIQUE: Multidetector CT imaging of the head and neck was performed using the standard protocol during bolus administration of intravenous contrast. Multiplanar CT image reconstructions and MIPs were obtained to evaluate the vascular anatomy. Carotid stenosis measurements (when applicable) are obtained utilizing NASCET criteria, using the distal internal carotid diameter as the denominator. CONTRAST:  70mL OMNIPAQUE IOHEXOL 350 MG/ML SOLN COMPARISON:  None. FINDINGS: CT HEAD FINDINGS Brain: Large mass lesion in the left frontal lobe. Delayed imaging reveals irregular enhancement of the periphery with central necrosis. The enhancing component of the mass measures approximately 53 x 28 mm. Surrounding low-density edema/tumor seen  around the enhancement. Low-density extends into the left basal ganglia. There is compression of the left lateral ventricle and 7 mm midline shift to the right. No acute hemorrhage. Negative for hydrocephalus Vascular: Negative for hyperdense vessel Skull: Negative Sinuses: Complete opacification right maxillary sinus with bony thickening. Mucosal edema extends into the right frontal and right ethmoid sinus. Remaining sinuses clear. Mastoid clear bilaterally. Orbits: Negative Review of the MIP images confirms the above findings CTA NECK FINDINGS Aortic arch: Normal aortic arch. Azygos branching pattern. Proximal great vessels widely patent. Right carotid system: Right carotid widely patent. Mild noncalcified plaque in the right carotid bulb. Left carotid system: Left carotid widely patent. No significant  stenosis or atherosclerotic disease. Vertebral arteries: Both vertebral arteries are widely patent to the basilar without stenosis. Skeleton: Disc degeneration and spurring C5-6 and C6-7. No acute skeletal abnormality. Poor dentition. Other neck: Negative for mass or adenopathy. Upper chest: Lung apices clear bilaterally Review of the MIP images confirms the above findings CTA HEAD FINDINGS Anterior circulation: Mild atherosclerotic calcification in the cavernous carotid without stenosis. Right middle cerebral artery widely patent. Hypoplastic right A1 segment. Azygos anterior cerebral artery arising from the left without significant stenosis. There is displacement and stretching of the left MCA branches due to the large left frontal mass. Posterior circulation: Both vertebral arteries patent to the basilar. PICA patent bilaterally. Basilar widely patent. AICA, superior cerebellar, and posterior cerebral arteries patent bilaterally without stenosis. Venous sinuses: Normal venous enhancement Anatomic variants: None Review of the MIP images confirms the above findings IMPRESSION: 1. Large mass lesion left frontal lobe  compatible with tumor. Favor glioblastoma. 2. Negative for acute infarct or hemorrhage 3. No significant carotid or vertebral artery stenosis in the neck. 4. Negative for intracranial large vessel occlusion. Electronically Signed   By: Franchot Gallo M.D.   On: 03/20/2020 13:29   MR Brain W and Wo Contrast  Result Date: 03/20/2020 CLINICAL DATA:  Acute right-sided weakness. Brain tumor shown by CT angiography. EXAM: MRI HEAD WITHOUT AND WITH CONTRAST TECHNIQUE: Multiplanar, multiecho pulse sequences of the brain and surrounding structures were obtained without and with intravenous contrast. CONTRAST:  8.77mL GADAVIST GADOBUTROL 1 MMOL/ML IV SOLN COMPARISON:  CT studies earlier same day FINDINGS: Brain: Approximately 6.5 X 4.5 x 4 cm tumor with the epicenter in the left basal ganglia and radiating white matter tracts region. Central necrosis. Contrast enhancement along the margins of the necrosis. The enhancing region measures 6 x 2.4 x 3.5 cm. There is mass effect with flattening of the left lateral ventricle. Left-to-right midline shift of 6 mm. No ventricular trapping. No satellite lesion identified. No second brain mass. No evidence of ischemic stroke. Vascular: Major vessels at the base of the brain show flow. Skull and upper cervical spine: Negative Sinuses/Orbits: Ostiomeatal unit pattern on the right with opacification of the right maxillary, ethmoid and frontal sinuses. Orbits negative. Other: None IMPRESSION: 6.5 x 4.5 x 4 cm tumor with the epicenter in the left basal ganglia and radiating white matter tracts. Central necrosis. Contrast enhancement along the margins of the necrosis. The enhancing region measures 6 x 2.4 x 3.5 cm. There is mass effect with flattening of the left lateral ventricle. Left-to-right midline shift of 6 mm. No ventricular trapping. No satellite lesion identified. 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 Right-sided paranasal sinusitis with  ostiomeatal unit pattern. Electronically Signed   By: Nelson Chimes M.D.   On: 03/20/2020 15:44   CT CHEST ABDOMEN PELVIS W CONTRAST  Result Date: 03/20/2020 CLINICAL DATA:  54 year old female with brain mass. Evaluate for metastatic disease. EXAM: CT CHEST, ABDOMEN, AND PELVIS WITH CONTRAST TECHNIQUE: Multidetector CT imaging of the chest, abdomen and pelvis was performed following the standard protocol during bolus administration of intravenous contrast. CONTRAST:  1108mL OMNIPAQUE IOHEXOL 350 MG/ML SOLN COMPARISON:  None. FINDINGS: CT CHEST FINDINGS Cardiovascular: There is no cardiomegaly or pericardial effusion. There is noncalcified plaque along the thoracic aorta and aortic arch. No aneurysmal dilatation or dissection. The origins of the great vessels of the aortic arch appear patent. The central pulmonary arteries are unremarkable for the degree of opacification. Mediastinum/Nodes: Top-normal right hilar lymph nodes. No  adenopathy. The esophagus and the thyroid gland are grossly unremarkable. No mediastinal fluid collection. Lungs/Pleura: There is a patchy area of consolidation with air bronchograms involving the right lower lobe which may represent atelectasis but concerning for pneumonia. Aspiration is not excluded. Clinical correlation and follow-up to resolution recommended to exclude an underlying mass. Faint left lung base linear and platelike atelectasis noted. There is no pleural effusion or pneumothorax. The central airways are patent. Musculoskeletal: No acute osseous pathology. CT ABDOMEN PELVIS FINDINGS No intra-abdominal free air. There is a small free fluid in the pelvis. Hepatobiliary: The liver is unremarkable. No intrahepatic biliary ductal dilatation. The gallbladder is unremarkable. Pancreas: Unremarkable. No pancreatic ductal dilatation or surrounding inflammatory changes. Spleen: Normal in size without focal abnormality. Adrenals/Urinary Tract: The adrenal glands are unremarkable.  There is no hydronephrosis on either side. There is symmetric enhancement and excretion of contrast by both kidneys. The visualized ureters and urinary bladder appear unremarkable. Stomach/Bowel: There is moderate stool throughout the colon. There is no bowel obstruction or active inflammation. The appendix is normal. Vascular/Lymphatic: The abdominal aorta and IVC are unremarkable. No portal venous gas. There is no adenopathy. Reproductive: Hysterectomy. There is a 9 mm calcific focus in the right posterior pelvis which is not evaluated but may be associated with the ovary. Pelvic ultrasound may provide better evaluation. Other: Anterior pelvic wall surgical scar.  No fluid collection. Musculoskeletal: Degenerative changes of the spine. No acute osseous pathology. IMPRESSION: 1. Right lower lobe consolidation concerning for pneumonia. Aspiration is not excluded. Clinical correlation and follow-up to resolution recommended to exclude an underlying mass. 2. No evidence of metastatic disease in the chest, abdomen or pelvis. 3. An indeterminate 9 mm calcific focus in the right posterior pelvis may be associated with the ovary. Pelvic ultrasound may provide better evaluation. 4. Aortic Atherosclerosis (ICD10-I70.0). Electronically Signed   By: Elgie Collard M.D.   On: 03/20/2020 19:52      [name], MD  [facility] 03/22/2020

## 2020-03-22 NOTE — Anesthesia Procedure Notes (Signed)
Arterial Line Insertion Start/End8/27/2021 11:42 AM, 03/22/2020 11:50 AM Performed by: Marcene Duos, MD, Pearson Grippe, CRNA, CRNA  Patient location: Pre-op. Preanesthetic checklist: patient identified, IV checked, site marked, risks and benefits discussed, surgical consent, monitors and equipment checked, pre-op evaluation, timeout performed and anesthesia consent Lidocaine 1% used for infiltration Right, radial was placed Catheter size: 20 Fr Hand hygiene performed  and maximum sterile barriers used   Attempts: 1 Procedure performed without using ultrasound guided technique. Following insertion, dressing applied. Post procedure assessment: normal and unchanged  Patient tolerated the procedure well with no immediate complications.

## 2020-03-22 NOTE — Transfer of Care (Signed)
Immediate Anesthesia Transfer of Care Note  Patient: Vickie Alvarado January  Procedure(s) Performed: FRAMELESS BIOPSY WITH BRAINLAB (Left Head) APPLICATION OF CRANIAL NAVIGATION (N/A Head)  Patient Location: PACU  Anesthesia Type:General  Level of Consciousness: drowsy, confused and lethargic - this was patients pre-operative baseline  Airway & Oxygen Therapy: Patient Spontanous Breathing and Patient connected to face mask oxygen  Post-op Assessment: Report given to RN and Post -op Vital signs reviewed and stable  Post vital signs: Reviewed and stable  Last Vitals:  Vitals Value Taken Time  BP 118/100 03/22/20 1347  Temp 36.4 C 03/22/20 1345  Pulse 116 03/22/20 1355  Resp 20 03/22/20 1355  SpO2 100 % 03/22/20 1355  Vitals shown include unvalidated device data.  Last Pain:  Vitals:   03/22/20 1345  TempSrc:   PainSc: Asleep         Complications: No complications documented.

## 2020-03-23 ENCOUNTER — Inpatient Hospital Stay: Payer: Self-pay

## 2020-03-23 ENCOUNTER — Encounter (HOSPITAL_COMMUNITY): Payer: Self-pay | Admitting: Neurological Surgery

## 2020-03-23 ENCOUNTER — Inpatient Hospital Stay (HOSPITAL_COMMUNITY): Payer: Medicare HMO

## 2020-03-23 LAB — POCT I-STAT 7, (LYTES, BLD GAS, ICA,H+H)
Acid-base deficit: 3 mmol/L — ABNORMAL HIGH (ref 0.0–2.0)
Acid-base deficit: 2 mmol/L (ref 0.0–2.0)
Bicarbonate: 20 mmol/L (ref 20.0–28.0)
Bicarbonate: 20.6 mmol/L (ref 20.0–28.0)
Calcium, Ion: 1.14 mmol/L — ABNORMAL LOW (ref 1.15–1.40)
Calcium, Ion: 1.15 mmol/L (ref 1.15–1.40)
HCT: 38 % (ref 36.0–46.0)
HCT: 33 % — ABNORMAL LOW (ref 36.0–46.0)
Hemoglobin: 12.9 g/dL (ref 12.0–15.0)
Hemoglobin: 11.2 g/dL — ABNORMAL LOW (ref 12.0–15.0)
O2 Saturation: 97 %
O2 Saturation: 98 %
Patient temperature: 100.5
Patient temperature: 98.6
Potassium: 2.5 mmol/L — CL (ref 3.5–5.1)
Potassium: 3.9 mmol/L (ref 3.5–5.1)
Sodium: 135 mmol/L (ref 135–145)
Sodium: 139 mmol/L (ref 135–145)
TCO2: 21 mmol/L — ABNORMAL LOW (ref 22–32)
TCO2: 21 mmol/L — ABNORMAL LOW (ref 22–32)
pCO2 arterial: 31 mmHg — ABNORMAL LOW (ref 32.0–48.0)
pCO2 arterial: 29.1 mmHg — ABNORMAL LOW (ref 32.0–48.0)
pH, Arterial: 7.422 (ref 7.350–7.450)
pH, Arterial: 7.458 — ABNORMAL HIGH (ref 7.350–7.450)
pO2, Arterial: 88 mmHg (ref 83.0–108.0)
pO2, Arterial: 106 mmHg (ref 83.0–108.0)

## 2020-03-23 LAB — GLUCOSE, CAPILLARY
Glucose-Capillary: 149 mg/dL — ABNORMAL HIGH (ref 70–99)
Glucose-Capillary: 174 mg/dL — ABNORMAL HIGH (ref 70–99)
Glucose-Capillary: 178 mg/dL — ABNORMAL HIGH (ref 70–99)
Glucose-Capillary: 180 mg/dL — ABNORMAL HIGH (ref 70–99)
Glucose-Capillary: 187 mg/dL — ABNORMAL HIGH (ref 70–99)
Glucose-Capillary: 207 mg/dL — ABNORMAL HIGH (ref 70–99)
Glucose-Capillary: 242 mg/dL — ABNORMAL HIGH (ref 70–99)

## 2020-03-23 LAB — CBC
HCT: 34.3 % — ABNORMAL LOW (ref 36.0–46.0)
Hemoglobin: 11.8 g/dL — ABNORMAL LOW (ref 12.0–15.0)
MCH: 29.6 pg (ref 26.0–34.0)
MCHC: 34.4 g/dL (ref 30.0–36.0)
MCV: 86 fL (ref 80.0–100.0)
Platelets: 315 10*3/uL (ref 150–400)
RBC: 3.99 MIL/uL (ref 3.87–5.11)
RDW: 14.2 % (ref 11.5–15.5)
WBC: 15 10*3/uL — ABNORMAL HIGH (ref 4.0–10.5)
nRBC: 0 % (ref 0.0–0.2)

## 2020-03-23 LAB — SODIUM
Sodium: 133 mmol/L — ABNORMAL LOW (ref 135–145)
Sodium: 138 mmol/L (ref 135–145)
Sodium: 144 mmol/L (ref 135–145)

## 2020-03-23 LAB — BASIC METABOLIC PANEL
Anion gap: 11 (ref 5–15)
BUN: 6 mg/dL (ref 6–20)
CO2: 20 mmol/L — ABNORMAL LOW (ref 22–32)
Calcium: 8 mg/dL — ABNORMAL LOW (ref 8.9–10.3)
Chloride: 106 mmol/L (ref 98–111)
Creatinine, Ser: 0.47 mg/dL (ref 0.44–1.00)
GFR calc Af Amer: >60 mL/min (ref >60)
GFR calc non Af Amer: >60 mL/min (ref >60)
Glucose, Bld: 206 mg/dL — ABNORMAL HIGH (ref 70–99)
Potassium: 4.1 mmol/L (ref 3.5–5.1)
Sodium: 137 mmol/L (ref 135–145)

## 2020-03-23 LAB — MAGNESIUM
Magnesium: 1.8 mg/dL (ref 1.7–2.4)
Magnesium: 2.1 mg/dL (ref 1.7–2.4)

## 2020-03-23 LAB — PHOSPHORUS
Phosphorus: 2 mg/dL — ABNORMAL LOW (ref 2.5–4.6)
Phosphorus: 2.3 mg/dL — ABNORMAL LOW (ref 2.5–4.6)

## 2020-03-23 LAB — TRIGLYCERIDES: Triglycerides: 200 mg/dL — ABNORMAL HIGH (ref <150)

## 2020-03-23 IMAGING — DX DG CHEST 1V PORT
2 series · 2 of 2 positions shown · non-contrast
Comparison: Chest CT dated [DATE].

CLINICAL DATA: 54-year-old female status post intubation.

EXAM:
PORTABLE CHEST 1 VIEW

[chest ap (1 of 2)]
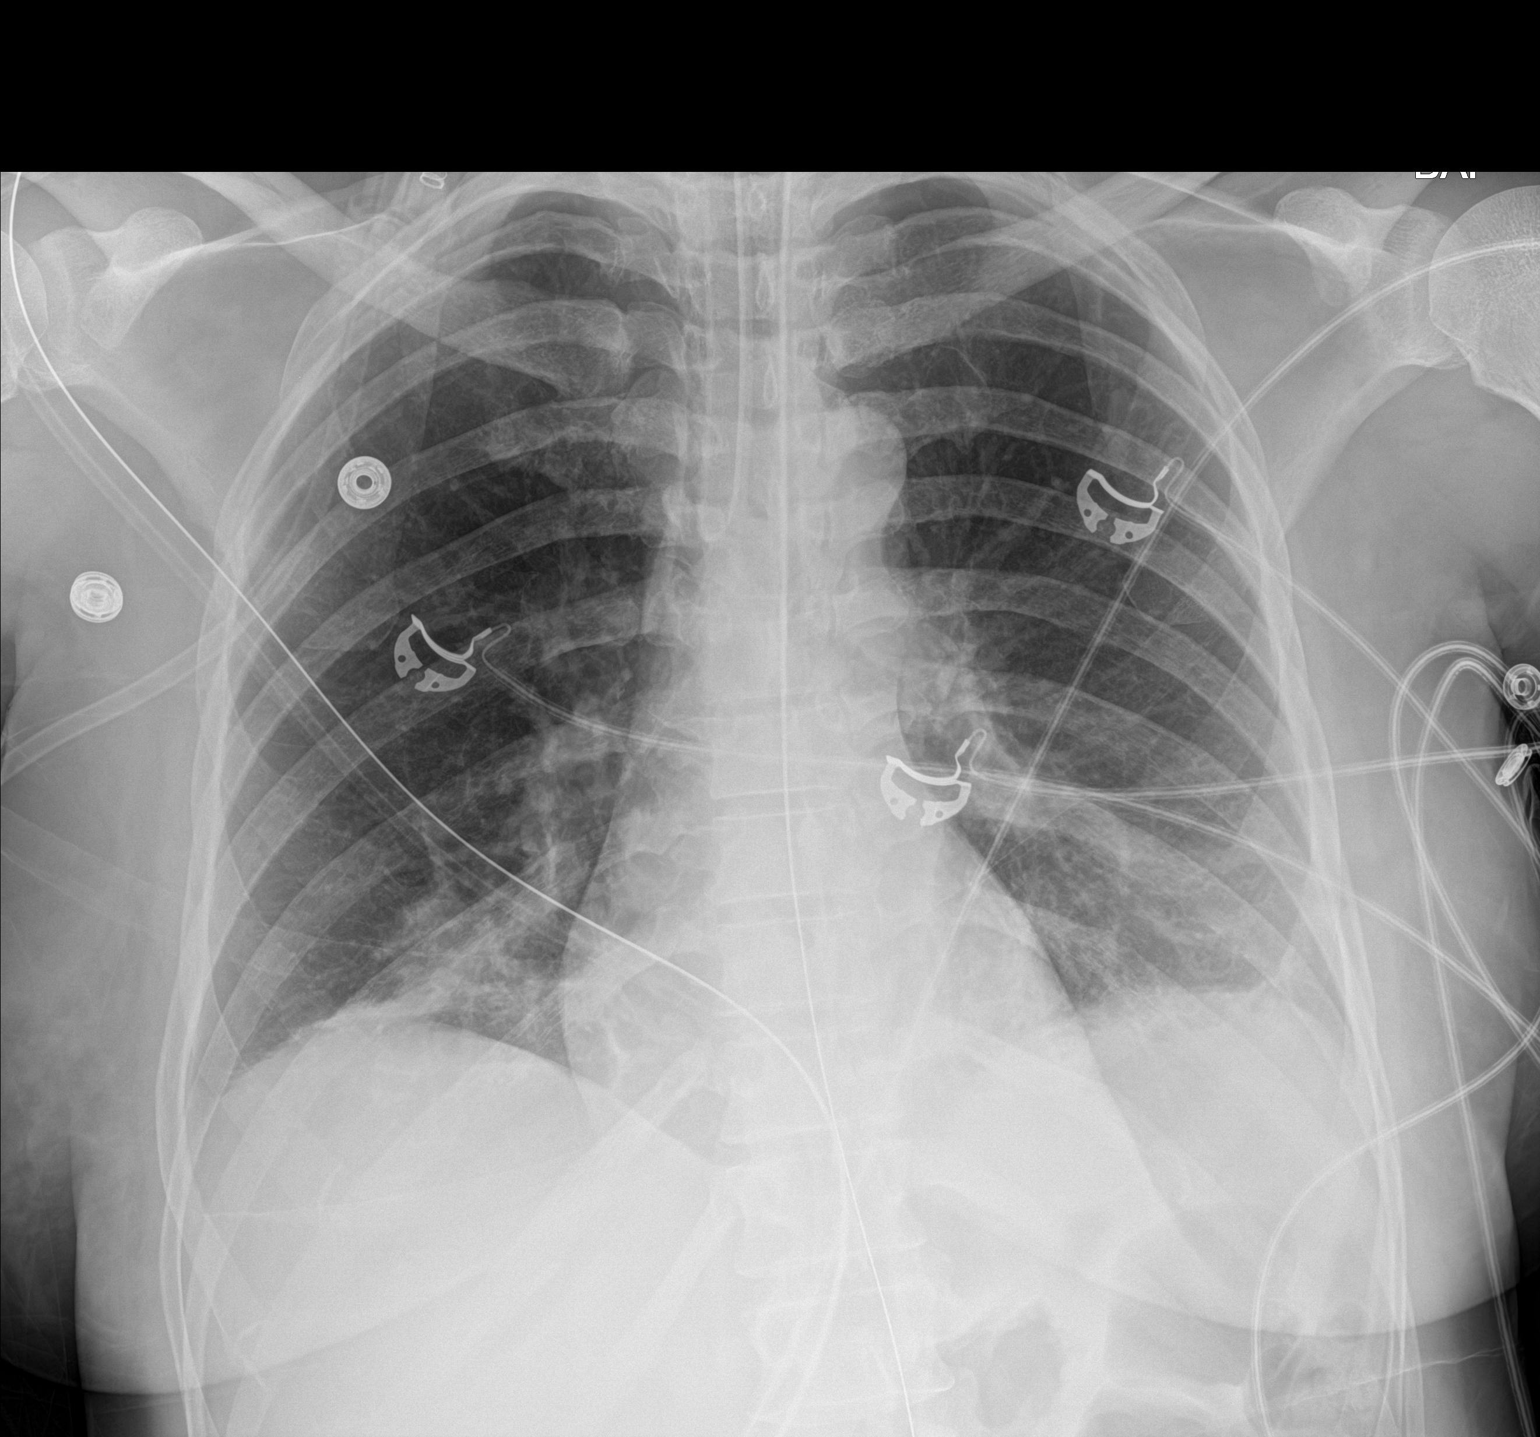

[chest ap (2 of 2)]
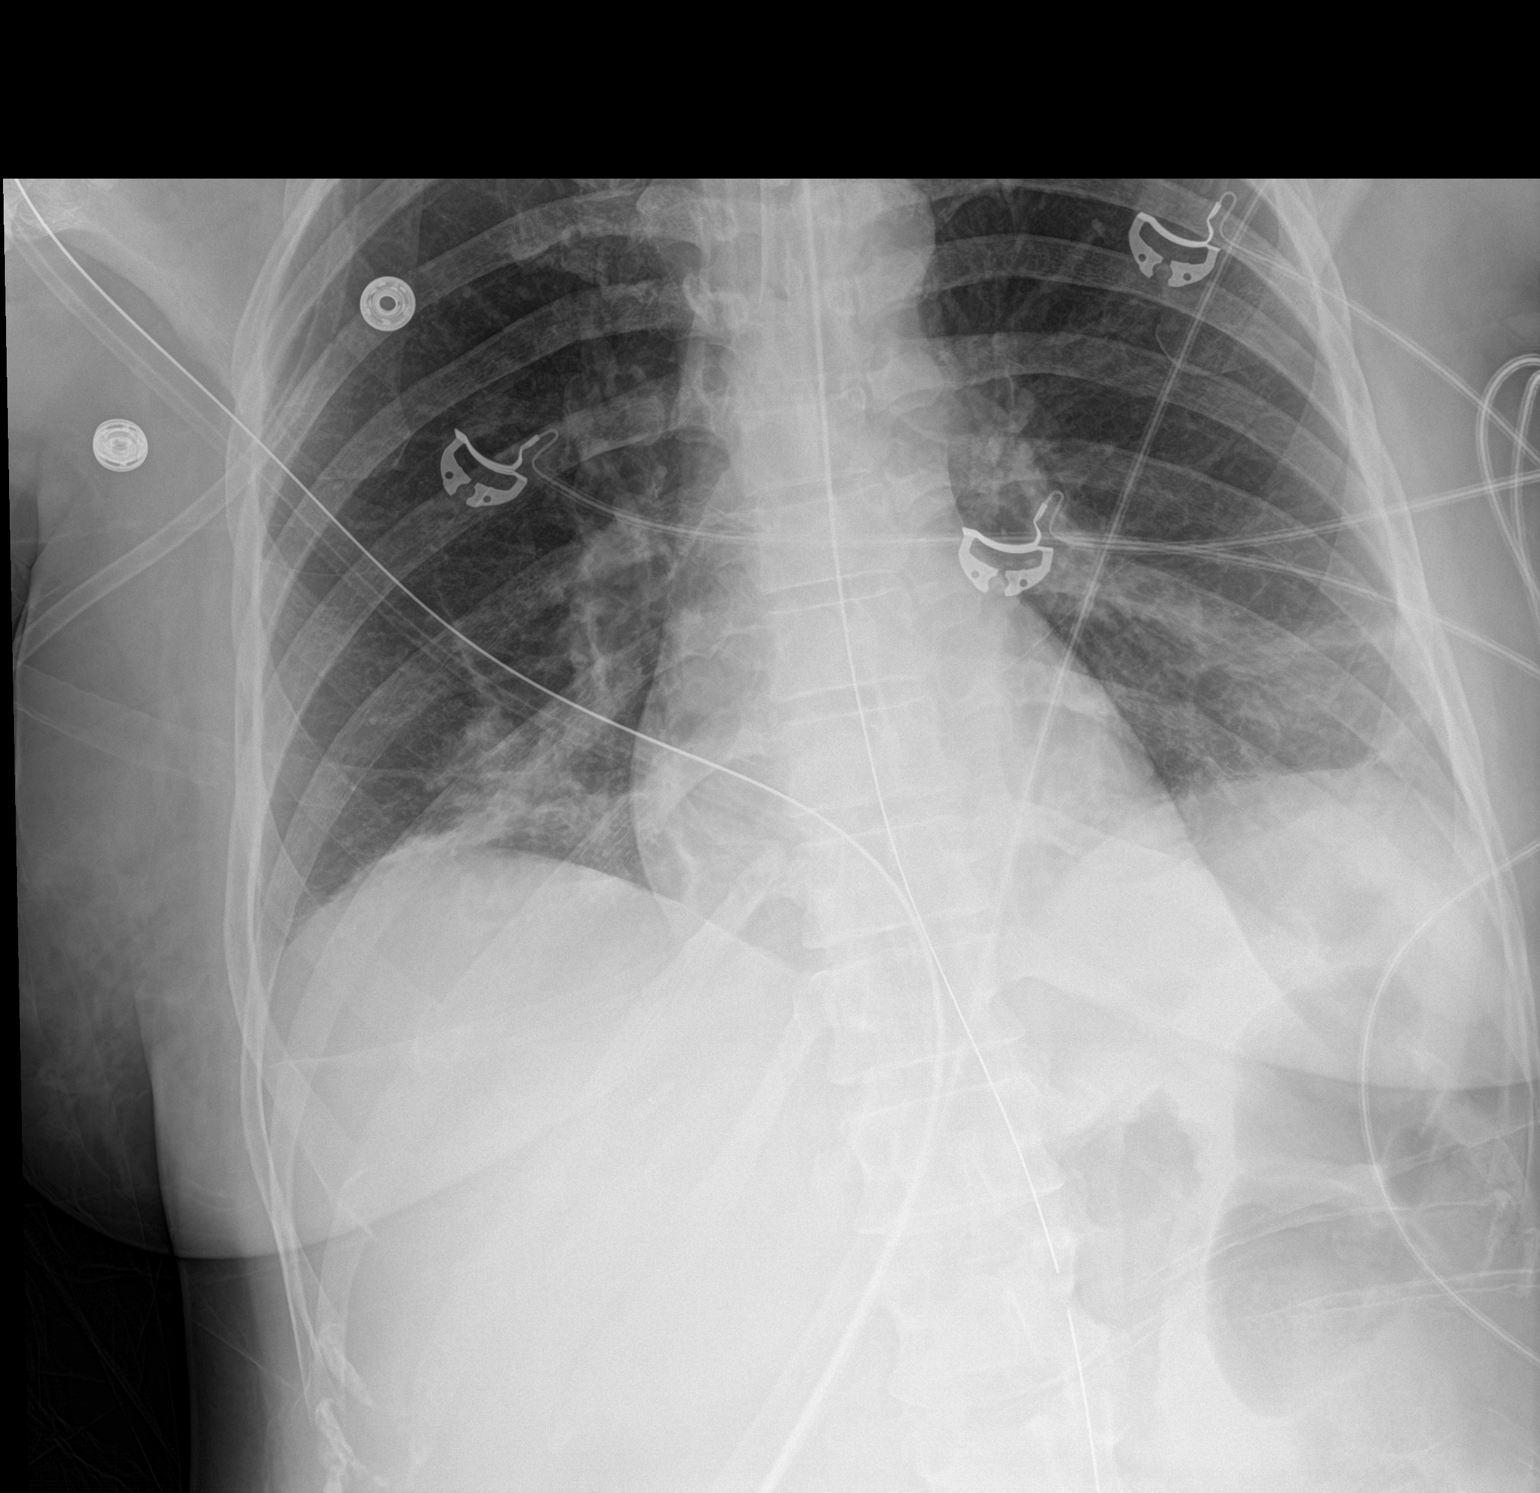

[2 of 2 positions shown; findings below may reference images not displayed]

FINDINGS: Endotracheal tube with tip at the level of the carina tilting
towards the right mainstem bronchus. Recommend retraction by
approximately 4 cm for optimal positioning. Enteric tube extends
below the diaphragm with tip beyond the inferior margin of the
image.

Bilateral streaky and hazy airspace densities may represent
atelectasis or pneumonia. No large pleural effusion. No
pneumothorax. The cardiac silhouette is within limits. No acute
osseous pathology.
IMPRESSION: 1. Endotracheal tube with tip at the level of the carina tilting
towards the right mainstem bronchus. Recommend retraction by 4 cm
for optimal positioning.
2. Bilateral streaky and hazy airspace densities may represent
atelectasis or pneumonia.

These results were called by telephone at the time of interpretation
acknowledged these results.

## 2020-03-23 MED ORDER — POLYETHYLENE GLYCOL 3350 17 G PO PACK
17.0000 g | PACK | Freq: Every day | ORAL | Status: DC
  Administered 2020-03-23 – 2020-04-11 (×10): 17 g
  Filled 2020-03-23 (×9): qty 1

## 2020-03-23 MED ORDER — VITAL HIGH PROTEIN PO LIQD
1000.0000 mL | ORAL | Status: DC
  Administered 2020-03-23 – 2020-03-28 (×6): 1000 mL

## 2020-03-23 MED ORDER — PROPOFOL 1000 MG/100ML IV EMUL
0.0000 ug/kg/min | INTRAVENOUS | Status: DC
  Administered 2020-03-23: 35 ug/kg/min via INTRAVENOUS
  Administered 2020-03-23: 5 ug/kg/min via INTRAVENOUS
  Administered 2020-03-23 – 2020-03-24 (×2): 35 ug/kg/min via INTRAVENOUS
  Administered 2020-03-24: 25 ug/kg/min via INTRAVENOUS
  Administered 2020-03-24: 30 ug/kg/min via INTRAVENOUS
  Administered 2020-03-24: 25 ug/kg/min via INTRAVENOUS
  Administered 2020-03-25: 29.924 ug/kg/min via INTRAVENOUS
  Administered 2020-03-25: 40 ug/kg/min via INTRAVENOUS
  Administered 2020-03-25: 20 ug/kg/min via INTRAVENOUS
  Administered 2020-03-25 – 2020-03-26 (×3): 30 ug/kg/min via INTRAVENOUS
  Filled 2020-03-23 (×14): qty 100

## 2020-03-23 MED ORDER — PROSOURCE TF PO LIQD
45.0000 mL | Freq: Two times a day (BID) | ORAL | Status: DC
  Administered 2020-03-23: 45 mL
  Filled 2020-03-23: qty 45

## 2020-03-23 MED ORDER — ORAL CARE MOUTH RINSE
15.0000 mL | OROMUCOSAL | Status: DC
  Administered 2020-03-23 – 2020-05-03 (×355): 15 mL via OROMUCOSAL

## 2020-03-23 MED ORDER — DOCUSATE SODIUM 50 MG/5ML PO LIQD
100.0000 mg | Freq: Two times a day (BID) | ORAL | Status: DC
  Filled 2020-03-23: qty 10

## 2020-03-23 MED ORDER — DOCUSATE SODIUM 50 MG/5ML PO LIQD
100.0000 mg | Freq: Two times a day (BID) | ORAL | Status: DC
  Administered 2020-03-23 – 2020-04-30 (×48): 100 mg
  Filled 2020-03-23 (×53): qty 10

## 2020-03-23 MED ORDER — CHLORHEXIDINE GLUCONATE 0.12% ORAL RINSE (MEDLINE KIT)
15.0000 mL | Freq: Two times a day (BID) | OROMUCOSAL | Status: DC
  Administered 2020-03-23 – 2020-05-03 (×83): 15 mL via OROMUCOSAL

## 2020-03-23 MED ORDER — FENTANYL CITRATE (PF) 100 MCG/2ML IJ SOLN
50.0000 ug | INTRAMUSCULAR | Status: DC | PRN
  Filled 2020-03-23: qty 2

## 2020-03-23 MED ORDER — ROCURONIUM BROMIDE 50 MG/5ML IV SOLN
40.0000 mg | Freq: Once | INTRAVENOUS | Status: AC
  Administered 2020-03-23: 40 mg via INTRAVENOUS

## 2020-03-23 MED ORDER — PROSOURCE TF PO LIQD
45.0000 mL | Freq: Three times a day (TID) | ORAL | Status: DC
  Administered 2020-03-23 – 2020-03-28 (×14): 45 mL
  Filled 2020-03-23 (×14): qty 45

## 2020-03-23 MED ORDER — ETOMIDATE 2 MG/ML IV SOLN
10.0000 mg | Freq: Once | INTRAVENOUS | Status: AC
  Administered 2020-03-23: 10 mg via INTRAVENOUS

## 2020-03-23 MED ORDER — POTASSIUM CHLORIDE 20 MEQ/15ML (10%) PO SOLN
40.0000 meq | Freq: Once | ORAL | Status: AC
  Administered 2020-03-23: 40 meq
  Filled 2020-03-23: qty 30

## 2020-03-23 MED ORDER — VITAL HIGH PROTEIN PO LIQD: 1000.0000 mL | ORAL | Status: DC

## 2020-03-23 MED ORDER — SODIUM CHLORIDE 0.9% FLUSH
10.0000 mL | Freq: Two times a day (BID) | INTRAVENOUS | Status: DC
  Administered 2020-03-23: 20 mL
  Administered 2020-03-23: 40 mL
  Administered 2020-03-24 – 2020-03-26 (×5): 10 mL
  Administered 2020-03-27 (×2): 20 mL
  Administered 2020-03-28 – 2020-04-04 (×15): 10 mL
  Administered 2020-04-05: 20 mL
  Administered 2020-04-06 – 2020-05-03 (×45): 10 mL

## 2020-03-23 MED ORDER — POLYETHYLENE GLYCOL 3350 17 G PO PACK
17.0000 g | PACK | Freq: Every day | ORAL | Status: DC
  Filled 2020-03-23: qty 1

## 2020-03-23 MED ORDER — POTASSIUM CHLORIDE 10 MEQ/100ML IV SOLN
10.0000 meq | INTRAVENOUS | Status: AC
  Administered 2020-03-23 (×4): 10 meq via INTRAVENOUS
  Filled 2020-03-23 (×4): qty 100

## 2020-03-23 MED ORDER — SODIUM CHLORIDE 0.9% FLUSH
10.0000 mL | INTRAVENOUS | Status: DC | PRN
  Administered 2020-04-22 – 2020-05-01 (×2): 10 mL

## 2020-03-23 MED ORDER — FENTANYL CITRATE (PF) 100 MCG/2ML IJ SOLN: 50.0000 ug | INTRAMUSCULAR | Status: DC | PRN

## 2020-03-23 NOTE — Progress Notes (Signed)
SLP Cancellation Note  Patient Details Name: Vickie Alvarado MRN: [id number] DOB: [birth year]   Cancelled treatment:        Pt intubated. Will follow along.   Royce Macadamia 03/23/2020, 8:21 AM   Breck Coons Lonell Face.Ed Nurse, children's [phone] Office [phone]

## 2020-03-23 NOTE — Progress Notes (Signed)
Hourly neuro check done and neuro changes noted; Bergman PA notified and stat CT scan ordered; will continue to monitor

## 2020-03-23 NOTE — Op Note (Addendum)
   Providing Compassionate, Quality Care - Together  DATE OF SERVICE: 03/22/2020  PREOP DIAGNOSIS:  1. Left ring enhancing mass 2. Right sided weakness   POSTOP DIAGNOSIS:  1. Left frontal abscess  PROCEDURE: 1. Left frontal stereotactic biopsy of abscess with aspiration 2. Use of intraoperative neuronavigation  SURGEON: Dr. Kendell Bane C. Aariya Ferrick, DO  ASSISTANT: Hoyt Koch, MD  ANESTHESIA: General Endotracheal  EBL: 10cc  SPECIMENS: Left frontal lesion  DRAINS: None  COMPLICATIONS: None  CONDITION: Stable  HISTORY: Vickie Alvarado is a 54 y.o. female that presented with right sided weakness after covid isolation that progressed over 1 week. Family found her to have some difficulty speaking and having facial droop. She was brought to the ED and found to have a large enhancing mass in the left frontal lobe as well as PNA, dental caries and enhancing frontal sinuses. I recommended biopsy with the family due to the mass effect and possibility of glioma versus abscess. The daughter agreed to all risks and benefits.    PROCEDURE IN DETAIL: The patient was brought to the operating room. After induction of general anesthesia, the patient was positioned on the operative table in the supine position. All pressure points were meticulously padded. She was placed in a mayfield head holder. She was registered to neuro navigation system and checked to have excellent accuracy. Skin incision was then marked out and prepped and draped in the usual sterile fashion.  A frontal approach was planned on neuro navigation through the middle frontal gyrus with careful analysis to avoid any vasculature.  Physician driven timeout was performed.  Local anesthetic was injected into the incision site.  Incision was made with a 15 blade.  Using a high-speed drill, a small craniectomy was performed.  Durotomy was made with a spinal needle.  The depth was carefully measured for the biopsy needle placed off neuro  navigation. The biopsy needle was passed to the desired depth under neuro navigation.  A biopsy of the lesion wall was obtained.  Immediately this appeared to be dark brown purulent tissue.  At this point I suspected abscess.  The specimen was sent for frozen pathology.  The biopsy needle was passed slightly deeper into the middle of the ring-enhancing lesion.  Gentle aspiration was applied in which approximately 20 cc of dark purulent fluid was obtained.  This was sent for microbiology.  The biopsy needle was rotated multiple directions (12:00, 3:00, 6:00, 9:00 positioning) and aspirated and noted that no other purulent fluid was aspirated in all directions.  The biopsy needle was then gently removed.  Hemostasis was achieved with the bovie cautery. Attention was then turned to wound closure.  Using a 3-0 Monocryl the incision was closed in a running fashion.  Pathology noted that this was inflammatory tissue most likely purulent.  At the end of the case all sponge, needle, and instrument counts were correct. The patient was then transferred to the stretcher, extubated, and taken to the post-anesthesia care unit in stable hemodynamic condition.

## 2020-03-23 NOTE — Progress Notes (Signed)
Notified RT of cxr results and recommendation to pull ETT back 4cm; RT to come to bedside shortly

## 2020-03-23 NOTE — Procedures (Signed)
Intubation Procedure Note  [name]  [id number]  [birth year]  Date:03/23/20  Time: 00:30   Provider Performing: Griffin Basil, MD   Procedure: Intubation (31500)  Indication(s) Respiratory Failure, encephalopathy  Consent Unable to obtain consent due to emergent nature of procedure. Risks of the procedure as well as the alternatives and risks of each were explained to daughter and she is agreeable to plan.   Anesthesia Etomidate and Rocuronium   Time Out Verified patient identification, verified procedure, site/side was marked, verified correct patient position, special equipment/implants available, medications/allergies/relevant history reviewed, required imaging and test results available.   Sterile Technique Usual hand hygeine, masks, and gloves were used   Procedure Description Patient positioned in bed supine.  Sedation given as noted above.  Patient was intubated with endotracheal tube using Glidescope.  View was Grade 1 full glottis .  Number of attempts was 1.  Colorimetric CO2 detector was consistent with tracheal placement.   Complications/Tolerance None; patient tolerated the procedure well. Chest X-ray is ordered to verify placement.   EBL None   Specimen(s) None

## 2020-03-23 NOTE — H&P (Signed)
NAME:  Vickie Alvarado, MRN:  [id number], DOB:  [birth year], LOS: 3 ADMISSION DATE:  03/20/2020, CONSULTATION DATE:  03/22/20 REFERRING MD:  Dr. Jake Samples, CHIEF COMPLAINT:  Brain Mass    Brief History   Patient is a 55 y/o F with a history of HTN, HLD, COVID-19 infection (03/01/20), dental infections, and sinus infections presented found to have a 6.5 x 4.5 x 4 cm mass, likely abscess in the Left basal ganglia. S/p L frontal stereotactic biopsy/aspiration of the abscess and PCCM consulted to admit.  History of present illness   Patient is a 54 y/o F with a history of HTN, HLD, COVID-19 infection (03/01/20), dental infections, and sinus infections presented to [facility], after being found altered, with R sided weakness, dysarthria, and R facial droop. Patient had decreased  P.O. intake, decreased appetite, and projectile vomiting in addition to her other symptoms. MRI brain showed a 6.5 x 4.5 x 4 cm mass in the Left basal ganglia. Neurosurgery performed a L frontal stereotactic biopsy/aspiration of the abscess and PCCM consulted for admission to neurosurgical ICU.   Past Medical History  Anxiety  Bipolar Disorder Dental infection HTN HLD  Significant Hospital Events   8/25> Admitted 8/27> Aspiration of brain abscess, admitted ICU   Consults:  PCCM  Neurosurgery   Procedures:  8/27> L frontal stereotactic biopsy/aspiration of L basal ganglia mass  Significant Diagnostic Tests:  8/25 MR Brain> 6.5x4.5x4 cm tumor in the epicenter of the L basal ganglia and radiating white matter tracts  Micro Data:  8/26 BC x2> NGTD 8/27 Gram Stain> sent 8/27 Aerobic/Anaerobe> sent 8/27 Fungus Culture with stain> Sent  8/27 Urine Culture<  <10k colonies    Antimicrobials:  8/27 Cefepime>> 8/27 8/27 Vanc>> 8/27 Metro>> 8/27 Ceftriaxone>> 8/27 Cefazolin>>8/27  Interim history/subjective:  Admitted following abscess drainage.  Remains sedated.  Objective   Blood pressure (!) 109/58, pulse 85,  temperature (!) 100.7 F (38.2 C), temperature source Axillary, resp. rate 17, weight 69.2 kg, SpO2 100 %.    Vent Mode: PRVC FiO2 (%):  [40 %-60 %] 40 % Set Rate:  [12 bmp-16 bmp] 12 bmp Vt Set:  [490 mL] 490 mL PEEP:  [5 cmH20] 5 cmH20 Plateau Pressure:  [14 cmH20-23 cmH20] 14 cmH20   Intake/Output Summary (Last 24 hours) at 03/23/2020 1514 Last data filed at 03/23/2020 1358 Gross per 24 hour  Intake 5495.36 ml  Output 2075 ml  Net 3420.36 ml   Filed Weights   03/22/20 1200 03/23/20 0400  Weight: 88 kg 69.2 kg    Examination: Physical Exam Constitutional:      General: She is not in acute distress.    Appearance: She is not toxic-appearing or diaphoretic.     Interventions: She is sedated.  HENT:     Head: Normocephalic.     Comments: Sutures in place with no erythema, purulence, or drainage, R sided loss of the labial nasal fold.     Nose: Nose normal.  Eyes:     General: No scleral icterus.       Right eye: No discharge.        Left eye: No discharge.     Conjunctiva/sclera: Conjunctivae normal.     Comments: Pupils sluggish but reactive No doll's eye reflex  Cardiovascular:     Rate and Rhythm: Normal rate and regular rhythm.     Pulses: Normal pulses.     Heart sounds: Normal heart sounds. No murmur heard.  No friction rub. No gallop.  Pulmonary:     Comments: Bronchial air sounds appreciated bilaterally.  Abdominal:     General: Abdomen is flat.     Tenderness: There is no abdominal tenderness.  Musculoskeletal:     Right lower leg: No edema.     Left lower leg: No edema.  Neurological:     Comments: Withdrawals from painful stimuli 2+ in LUE and LLE 1+ RUE and RLE     Resolved Hospital Problem list     Assessment & Plan:  54 y/o female with HTN, HLD, bipolar disorder, dental infections, and sinus infections presenting with new found brain mass. Admitted for surgical/medical management.   Critically ill due to acute hypoxic respiratory failure  requiring mechanical ventilation Critically ill due to space occupying lesion due to cerebral abscess requiring initiation of hypertonic saline to limit cerebral edema Acute toxic metabolic encephalopathy Hypertension Dyslipidemia Bipolar disorder  Plan:  Continue full mechanical ventilatory support and current level of sedation. Follow-up on MRI.  May require further drainage. Tailor antibiotics to cultures.  ID following.   Best practice:  Diet: NPO Pain/Anxiety/Delirium protocol (if indicated): Holding home medications DVT prophylaxis: SCDs Glucose control: SSI  Mobility: Bed rest  Code Status: FULL  Family Communication: Per primary  Disposition: ICU  Labs   CBC: Recent Labs  Lab 03/20/20 1011 03/20/20 1011 03/21/20 0616 03/21/20 0616 03/22/20 0411 03/22/20 1512 03/23/20 0131 03/23/20 0437 03/23/20 0525  WBC 9.5  --  12.0*  --  13.9*  --   --  15.0*  --   NEUTROABS 6.7  --   --   --   --   --   --   --   --   HGB 12.5   < > 12.7   < > 13.3 10.5* 12.9 11.8* 11.2*  HCT 37.6   < > 37.4   < > 39.0 31.0* 38.0 34.3* 33.0*  MCV 88.1  --  86.6  --  85.9  --   --  86.0  --   PLT 360  --  338  --  363  --   --  315  --    < > = values in this interval not displayed.    Basic Metabolic Panel: Recent Labs  Lab 03/20/20 1011 03/20/20 1011 03/21/20 0616 03/21/20 0616 03/22/20 0411 03/22/20 0411 03/22/20 1512 03/22/20 1512 03/23/20 0004 03/23/20 0131 03/23/20 0437 03/23/20 0525 03/23/20 1048  NA 134*   < > 135   < > 134*   < > 143   < > 133* 135 137 139 138  K 3.6   < > 3.7   < > 3.6  --  2.9*  --   --  2.5* 4.1 3.9  --   CL 97*  --  101  --  103  --   --   --   --   --  106  --   --   CO2 24  --  20*  --  17*  --   --   --   --   --  20*  --   --   GLUCOSE 215*  --  162*  --  173*  --   --   --   --   --  206*  --   --   BUN 11  --  11  --  11  --   --   --   --   --  6  --   --  CREATININE 0.44  --  0.56  --  0.44  --   --   --   --   --  0.47  --   --    CALCIUM 9.3  --  9.0  --  8.9  --   --   --   --   --  8.0*  --   --   MG  --   --   --   --  1.9  --   --   --   --   --   --   --  1.8  PHOS  --   --   --   --   --   --   --   --   --   --   --   --  2.0*   < > = values in this interval not displayed.   GFR: Estimated Creatinine Clearance: 77.3 mL/min (by C-G formula based on SCr of 0.47 mg/dL). Recent Labs  Lab 03/20/20 1011 03/21/20 0616 03/22/20 0411 03/23/20 0437  WBC 9.5 12.0* 13.9* 15.0*    Liver Function Tests: Recent Labs  Lab 03/20/20 1011 03/21/20 0616  AST 11* 9*  ALT 14 12  ALKPHOS 103 93  BILITOT 0.6 0.7  PROT 7.2 6.7  ALBUMIN 3.2* 3.0*   No results for input(s): LIPASE, AMYLASE in the last 168 hours. No results for input(s): AMMONIA in the last 168 hours.  ABG    Component Value Date/Time   PHART 7.458 (H) 03/23/2020 0525   PCO2ART 29.1 (L) 03/23/2020 0525   PO2ART 106 03/23/2020 0525   HCO3 20.6 03/23/2020 0525   TCO2 21 (L) 03/23/2020 0525   ACIDBASEDEF 2.0 03/23/2020 0525   O2SAT 98.0 03/23/2020 0525     Coagulation Profile: Recent Labs  Lab 03/20/20 1011  INR 1.2    Cardiac Enzymes: No results for input(s): CKTOTAL, CKMB, CKMBINDEX, TROPONINI in the last 168 hours.  HbA1C: Hgb A1c MFr Bld  Date/Time Value Ref Range Status  03/22/2020 04:55 PM 11.5 (H) 4.8 - 5.6 % Final    Comment:    (NOTE) Pre diabetes:          5.7%-6.4%  Diabetes:              >6.4%  Glycemic control for   <7.0% adults with diabetes     CBG: Recent Labs  Lab 03/22/20 2156 03/23/20 0043 03/23/20 0315 03/23/20 0754 03/23/20 1146  GLUCAP 199* 207* 242* 178* 187*   CRITICAL CARE Performed by: Lynnell Catalan   Total critical care time: 35 minutes  Critical care time was exclusive of separately billable procedures and treating other patients.  Critical care was necessary to treat or prevent imminent or life-threatening deterioration.  Critical care was time spent personally by me on the  following activities: development of treatment plan with patient and/or surrogate as well as nursing, discussions with consultants, evaluation of patient's response to treatment, examination of patient, obtaining history from patient or surrogate, ordering and performing treatments and interventions, ordering and review of laboratory studies, ordering and review of radiographic studies, pulse oximetry, re-evaluation of patient's condition and participation in multidisciplinary rounds.  Lynnell Catalan, MD [facility] ICU Physician [facility] [place] Critical [facility]  Pager: [phone] Mobile: [phone] After hours: [phone].

## 2020-03-23 NOTE — Progress Notes (Signed)
Postop day 1, status post aspiration of left basal ganglia abscess.  Overnight patient is mental status further declined.  She no longer awakens to pain.  Her pupils are midposition and sluggish.  Corneal reflexes are present.  Cough and gag reflex is present.  She extends her extremities to noxious stimuli.  Gram stain with numerous gram-positive cocci.  Patient pending.  Patient on broad-spectrum antibiotics with good gram-positive and anaerobic coverage.  Follow-up head CT scan last night demonstrated no evidence of new hemorrhage.  Abscess cavity has been drained.  There is marked surrounding cerebritis with some left-to-right midline shift and some degree of effacement of the basilar cisterns bilaterally.  Patient with large brain abscess with surrounding cerebritis.  Continue maximal efforts-pain control including hypertonic saline, hyperventilation, Decadron and antibiotics.

## 2020-03-23 NOTE — Progress Notes (Addendum)
ID PROGRESS NOTE  Gram stain showing GPC, overnight decreased mentation and had repeat NCHCT showing shift and edema from large brain abscess. NSGY following. Continue on steroids, hypertonic saline, and hyperventilation  Continue on current broad spectrum abtx Can place picc line if needed for her infusions  Arnetha Silverthorne B. Drue Second MD MPH [facility] [phone]

## 2020-03-23 NOTE — Progress Notes (Signed)
Spoke with Dr. Drue Second c/o PICC order. She stated that it was alright to place line for hypertonic solution. Will move forward with PICC placement.

## 2020-03-23 NOTE — Progress Notes (Signed)
eLink Physician-Brief Progress Note Patient Name: Vickie Alvarado DOB: 29-May-1966 MRN: [id number]   Date of Service  03/23/2020  HPI/Events of Note  K+ 2.5  eICU Interventions  E-link adult electrolyte protocol ordered.        Thomasene Lot Bellanie Matthew 03/23/2020, 3:49 AM

## 2020-03-23 NOTE — Progress Notes (Signed)
Pt. Intubated by Dr. Isabella Bowens and currently stable.  Pt's daughter [name] updated.  Darcella Gasman Gavin Telford, PA-C

## 2020-03-23 NOTE — Progress Notes (Signed)
Peripherally Inserted Central Catheter Placement  The IV Nurse has discussed with the patient and/or persons authorized to consent for the patient, the purpose of this procedure and the potential benefits and risks involved with this procedure.  The benefits include less needle sticks, lab draws from the catheter, and the patient may be discharged home with the catheter. Risks include, but not limited to, infection, bleeding, blood clot (thrombus formation), and puncture of an artery; nerve damage and irregular heartbeat and possibility to perform a PICC exchange if needed/ordered by physician.  Alternatives to this procedure were also discussed.  Bard Power PICC patient education guide, fact sheet on infection prevention and patient information card has been provided to patient /or left at bedside. Telephone consent obtained by daughter, Christinia Gully.    PICC Placement Documentation  PICC Double Lumen 03/23/20 PICC Right Brachial 36 cm 0 cm (Active)  Indication for Insertion or Continuance of Line Administration of hyperosmolar/irritating solutions (i.e. TPN, Vancomycin, etc.) 03/23/20 1300  Exposed Catheter (cm) 0 cm 03/23/20 1300  Site Assessment Clean;Dry;Intact 03/23/20 1300  Lumen #1 Status Flushed;Blood return noted;Saline locked 03/23/20 1300  Lumen #2 Status Flushed;Blood return noted;Saline locked 03/23/20 1300  Dressing Type Transparent 03/23/20 1300  Dressing Status Clean;Dry;Intact;Antimicrobial disc in place 03/23/20 1300  Safety Lock Not Applicable 03/23/20 1300  Line Care Connections checked and tightened 03/23/20 1300  Line Adjustment (NICU/IV Team Only) No 03/23/20 1300  Dressing Intervention New dressing 03/23/20 1300  Dressing Change Due 03/30/20 03/23/20 1300       Yanil Dawe III, Arnetha Massy 03/23/2020, 1:14 PM

## 2020-03-23 NOTE — Progress Notes (Signed)
Initial Nutrition Assessment  DOCUMENTATION CODES:   Not applicable  INTERVENTION:   Tube feeding:  -Vital High Protein @ 45 ml/hr via OG -ProSource TF 45 ml TID  Provides: 1200 kcals (2197 kcal with propofol), 128 grams protein, 1003 ml free water.   Monitor magnesium, potassium, and phosphorus daily for at least 3 days, MD to replete as needed  NUTRITION DIAGNOSIS:   Increased nutrient needs related to post-op healing as evidenced by estimated needs.  GOAL:   Patient will meet greater than or equal to 90% of their needs  MONITOR:   Vent status, Skin, TF tolerance, Weight trends, Labs, I & O's  REASON FOR ASSESSMENT:   Ventilator, Consult Enteral/tube feeding initiation and management  ASSESSMENT:   Patient with PMH significant for HTN, HLD, COVID 19 infection (03/01/20), and dental infections. Presents this admission with R sided hemiplegia and R sided facial droop. 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   8/27- L frontal stereotactic biopsy, abscess aspiration  May require further drainage. Started on 3% saline. OG confirmed in appropriate position.   Pt shows to have weighed 88 kg on 8/6 and 69.2 kg this admission. Unable to determine actual dry weight loss from fluid fluctuations. Need NFPE and further history to assess for malnutrition.   Patient is currently intubated on ventilator support MV: 11.3 L/min Temp (24hrs), Avg:100.3 F (37.9 C), Min:99.6 F (37.6 C), Max:100.7 F (38.2 C)  Propofol: 7.92 ml/hr + Cleviprex: 16 ml/hr- provides 997 kcal from lipids daily   I/O: +5,209 ml since admit  UOP: 2,375 ml x 24 hrs   Drips: cleviprex, propofol, 3% saline Medications: decadron, colace, SS novolog, miralax Labs: Phosphorus 2.0 (L) CBG 149-242  Diet Order:   Diet Order            Diet NPO time specified  Diet effective now                 EDUCATION NEEDS:   Not appropriate for education at this time  Skin:   Skin Assessment: Skin Integrity Issues: Skin Integrity Issues:: Incisions Incisions: L head  Last BM:  PTA  Height:   Ht Readings from Last 1 Encounters:  03/01/20 5\' 7"  (1.702 m)    Weight:   Wt Readings from Last 1 Encounters:  03/23/20 69.2 kg    BMI:  Body mass index is 23.89 kg/m.  Estimated Nutritional Needs:   Kcal:  03/25/20  Protein:  105-135 grams  Fluid:  >/= 1.7 L/day   [phone] RD, LDN Clinical Nutrition Pager listed in AMION

## 2020-03-23 NOTE — Progress Notes (Signed)
Notified Bergman PA of neuro changes; pt posturing with painful stimulus; per Doran Durand PA, she discussed changes with Dr Jordan Likes and he stated he wanted  To "try to keep her pCO2 between 36 to 40. Otherwise nothing additional"; RT notified

## 2020-03-24 ENCOUNTER — Inpatient Hospital Stay (HOSPITAL_COMMUNITY): Payer: Medicare HMO

## 2020-03-24 DIAGNOSIS — I6340 Cerebral infarction due to embolism of unspecified cerebral artery: Secondary | ICD-10-CM | POA: Insufficient documentation

## 2020-03-24 LAB — COMPREHENSIVE METABOLIC PANEL
ALT: 12 U/L (ref 0–44)
AST: 11 U/L — ABNORMAL LOW (ref 15–41)
Albumin: 2.3 g/dL — ABNORMAL LOW (ref 3.5–5.0)
Alkaline Phosphatase: 60 U/L (ref 38–126)
Anion gap: 8 (ref 5–15)
BUN: 13 mg/dL (ref 6–20)
CO2: 21 mmol/L — ABNORMAL LOW (ref 22–32)
Calcium: 8 mg/dL — ABNORMAL LOW (ref 8.9–10.3)
Chloride: 118 mmol/L — ABNORMAL HIGH (ref 98–111)
Creatinine, Ser: 0.43 mg/dL — ABNORMAL LOW (ref 0.44–1.00)
GFR calc Af Amer: >60 mL/min (ref >60)
GFR calc non Af Amer: >60 mL/min (ref >60)
Glucose, Bld: 182 mg/dL — ABNORMAL HIGH (ref 70–99)
Potassium: 2.9 mmol/L — ABNORMAL LOW (ref 3.5–5.1)
Sodium: 147 mmol/L — ABNORMAL HIGH (ref 135–145)
Total Bilirubin: 0.5 mg/dL (ref 0.3–1.2)
Total Protein: 5.1 g/dL — ABNORMAL LOW (ref 6.5–8.1)

## 2020-03-24 LAB — CBC WITH DIFFERENTIAL/PLATELET
Abs Immature Granulocytes: 0.1 10*3/uL — ABNORMAL HIGH (ref 0.00–0.07)
Basophils Absolute: 0 10*3/uL (ref 0.0–0.1)
Basophils Relative: 0 %
Eosinophils Absolute: 0 10*3/uL (ref 0.0–0.5)
Eosinophils Relative: 0 %
HCT: 31.5 % — ABNORMAL LOW (ref 36.0–46.0)
Hemoglobin: 10.3 g/dL — ABNORMAL LOW (ref 12.0–15.0)
Immature Granulocytes: 1 %
Lymphocytes Relative: 12 %
Lymphs Abs: 1.4 10*3/uL (ref 0.7–4.0)
MCH: 29.4 pg (ref 26.0–34.0)
MCHC: 32.7 g/dL (ref 30.0–36.0)
MCV: 90 fL (ref 80.0–100.0)
Monocytes Absolute: 0.8 10*3/uL (ref 0.1–1.0)
Monocytes Relative: 7 %
Neutro Abs: 9.9 10*3/uL — ABNORMAL HIGH (ref 1.7–7.7)
Neutrophils Relative %: 80 %
Platelets: 247 10*3/uL (ref 150–400)
RBC: 3.5 MIL/uL — ABNORMAL LOW (ref 3.87–5.11)
RDW: 15 % (ref 11.5–15.5)
WBC: 12.2 10*3/uL — ABNORMAL HIGH (ref 4.0–10.5)
nRBC: 0 % (ref 0.0–0.2)

## 2020-03-24 LAB — GLUCOSE, CAPILLARY
Glucose-Capillary: 136 mg/dL — ABNORMAL HIGH (ref 70–99)
Glucose-Capillary: 138 mg/dL — ABNORMAL HIGH (ref 70–99)
Glucose-Capillary: 177 mg/dL — ABNORMAL HIGH (ref 70–99)
Glucose-Capillary: 203 mg/dL — ABNORMAL HIGH (ref 70–99)
Glucose-Capillary: 209 mg/dL — ABNORMAL HIGH (ref 70–99)
Glucose-Capillary: 224 mg/dL — ABNORMAL HIGH (ref 70–99)

## 2020-03-24 LAB — PHOSPHORUS
Phosphorus: 2.3 mg/dL — ABNORMAL LOW (ref 2.5–4.6)
Phosphorus: 1.7 mg/dL — ABNORMAL LOW (ref 2.5–4.6)

## 2020-03-24 LAB — SODIUM
Sodium: 147 mmol/L — ABNORMAL HIGH (ref 135–145)
Sodium: 150 mmol/L — ABNORMAL HIGH (ref 135–145)
Sodium: 151 mmol/L — ABNORMAL HIGH (ref 135–145)

## 2020-03-24 LAB — MAGNESIUM
Magnesium: 2 mg/dL (ref 1.7–2.4)
Magnesium: 2.2 mg/dL (ref 1.7–2.4)

## 2020-03-24 LAB — TRIGLYCERIDES: Triglycerides: 95 mg/dL (ref <150)

## 2020-03-24 LAB — VANCOMYCIN, TROUGH: Vancomycin Tr: 6 ug/mL — ABNORMAL LOW (ref 15–20)

## 2020-03-24 IMAGING — MR MR MRA HEAD W/O CM
1 series · 15 of 48 positions shown · IV contrast (gadavist)
Comparison: Prior MRI from [DATE] and [DATE].

CLINICAL DATA: 54-year-old female with history of brain abscess,
status post stereotactic aspiration/drainage.

EXAM:
MRI HEAD WITHOUT AND WITH CONTRAST
MRA HEAD WITHOUT CONTRAST
TECHNIQUE: Multiplanar, multiecho pulse sequences of the brain and surrounding
structures were obtained without and with intravenous contrast.
Angiographic images of the head were obtained using MRA technique
without contrast.
CONTRAST:  6mL GADAVIST GADOBUTROL 1 MMOL/ML IV SOLN

[Series 19: 3d cow · axial · 0.5mm · 0.41mm/px · z∈[-76,+9]mm · 15 of 180 slices shown]
[im 1/180]
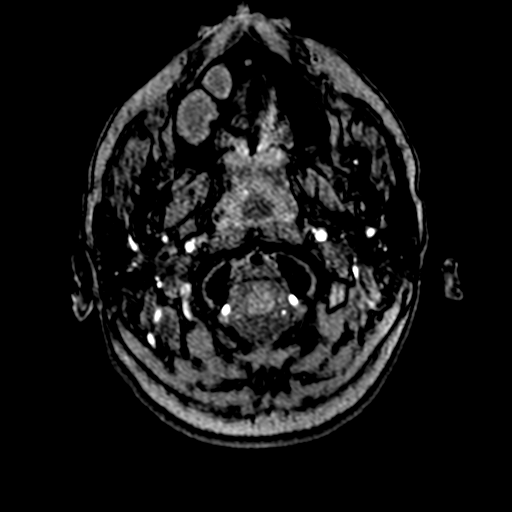
[im 4/180]
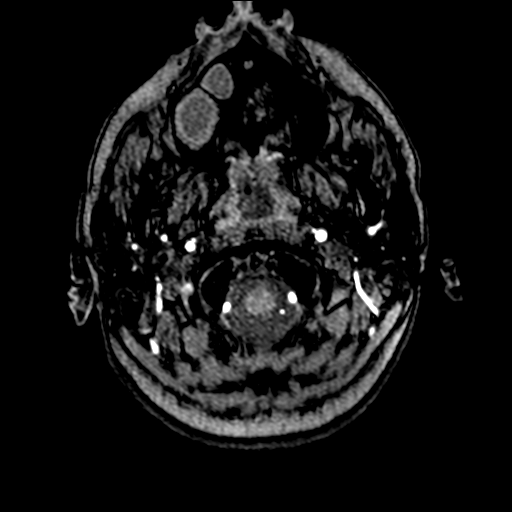
[im 8/180]
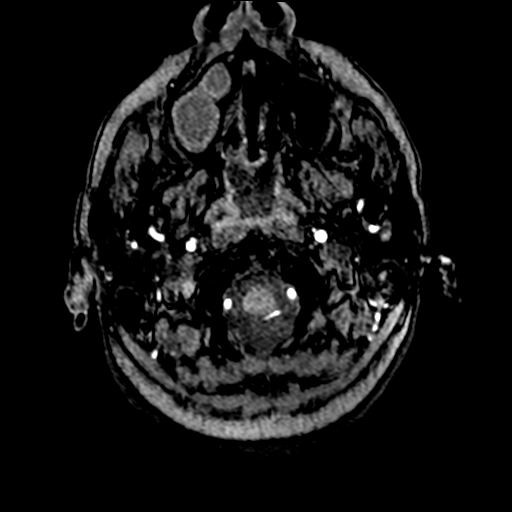
[im 12/180]
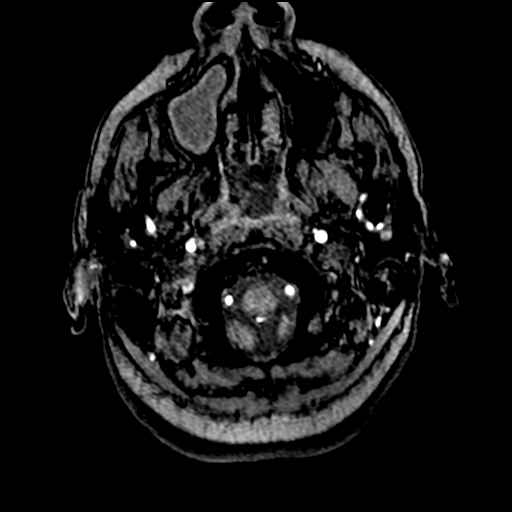
[im 16/180]
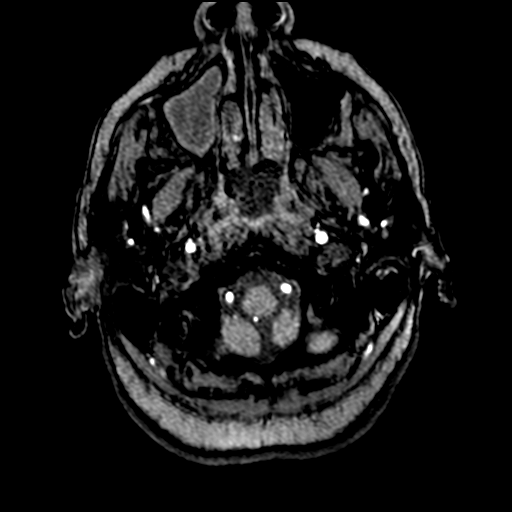
[im 31/180]
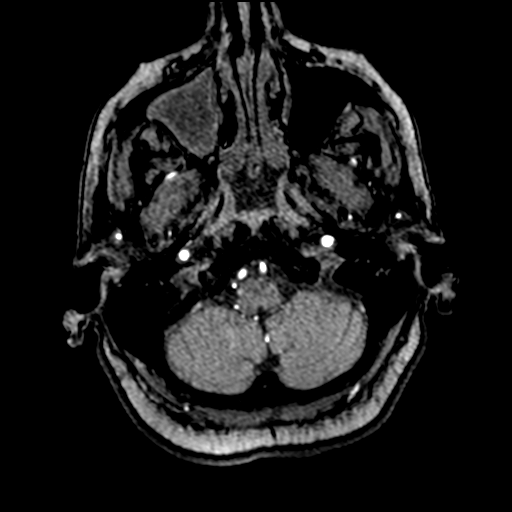
[im 35/180]
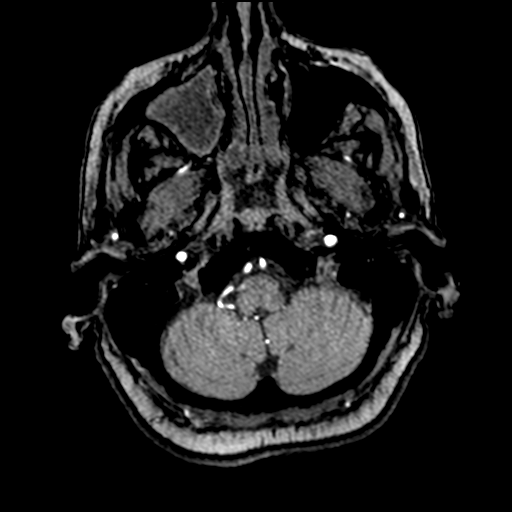
[im 58/180]
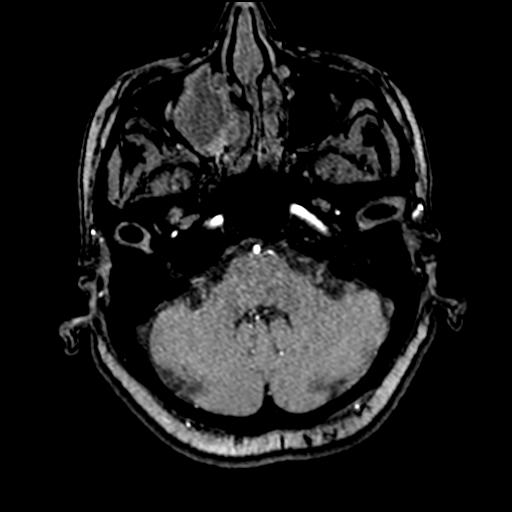
[im 80/180]
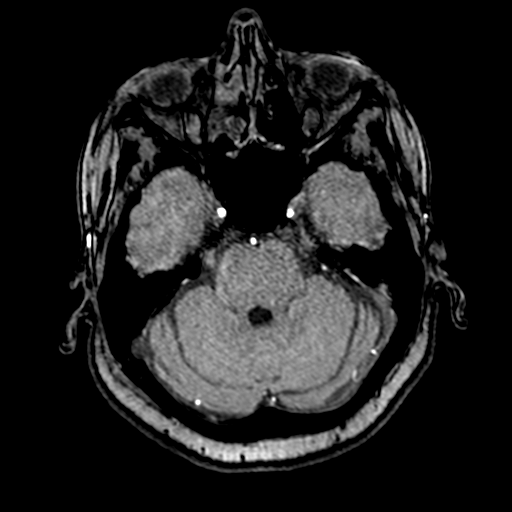
[im 92/180]
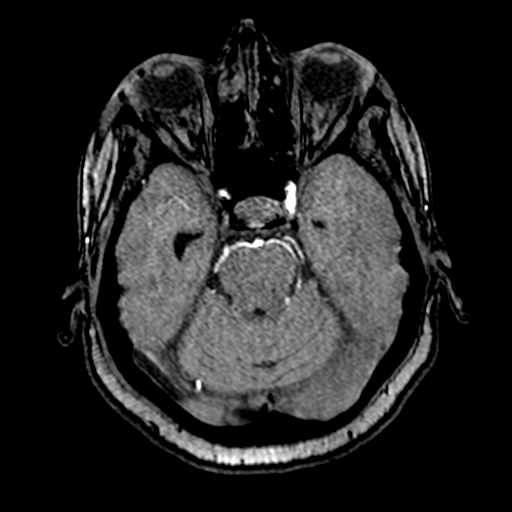
[im 103/180]
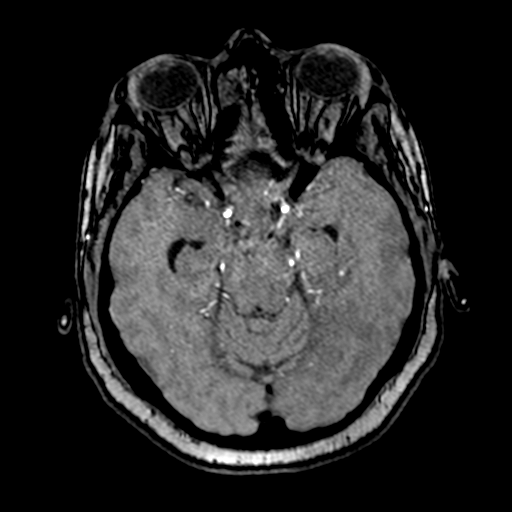
[im 126/180]
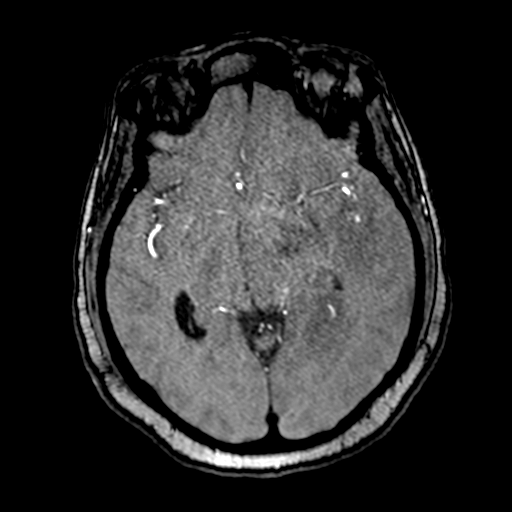
[im 149/180]
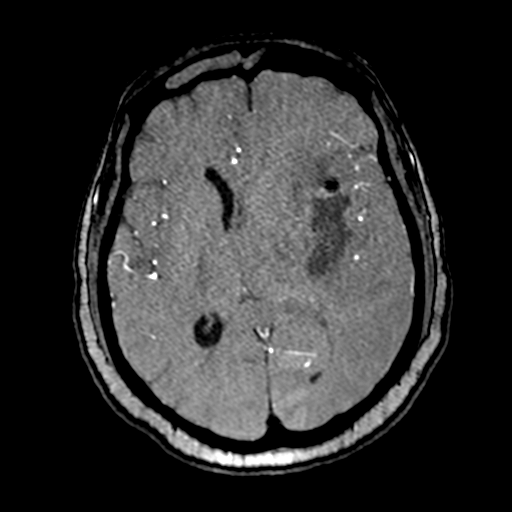
[im 153/180]
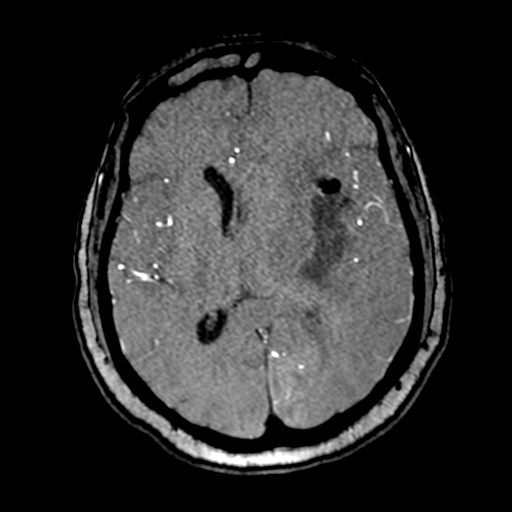
[im 172/180]
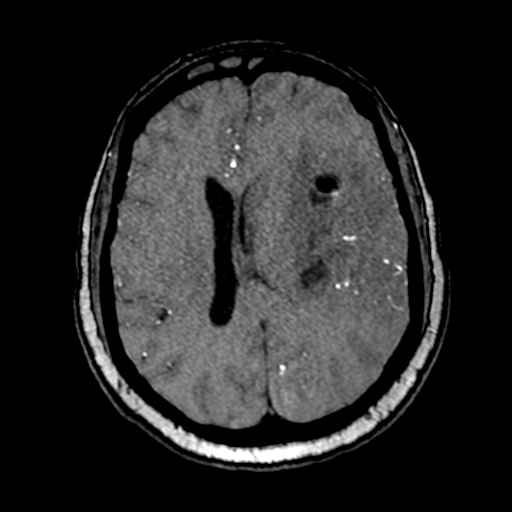

[15 of 48 positions shown; findings below may reference images not displayed]

FINDINGS: MRI HEAD FINDINGS

Brain: Postoperative changes from interval stereotactic aspiration
of previously identified lesion centered at the left basal ganglia
is seen. Aspiration tract with a small amount of susceptibility
artifacts seen extending via a left frontal approach. The lesion at
the left basal ganglia is slightly decreased in size now measuring
5.1 x 2.4 x 2.7 cm, previously 6.0 x 2.4 x 3.5 cm. Associated
internal fluid-fluid level with restricted diffusion. Small focus of
pneumocephalus at the nondependent portion of this lesion. Per
history, this lesion was found to be most consistent with an
intracerebral infection/abscess. Persistent surrounding vasogenic
edema throughout the adjacent left cerebral hemisphere with regional
mass effect and effacement of the left lateral ventricle. Edema
extends into the left cerebral peduncle and left midbrain, similar
to previous. Associated left-to-right shift has worsened now
measuring up to 10 mm, previously 6 mm. No overt hydrocephalus or
ventricular trapping at this time. Crowding of the basilar cisterns
with mass effect on the adjacent midbrain, similar to slightly
worsened. No transtentorial herniation.

1 cm right thalamic infarct is similar to previous. No associated
hemorrhage.

Interval development of confluent restricted diffusion throughout
the left temporal occipital region, left PCA distribution,
consistent with acute ischemia. No associated hemorrhage. Multiple
additional new patchy ischemic infarcts involving the bilateral
thalami, splenium, parasagittal right temporal occipital region, and
left dorsal pons. Right thalamic ischemic changes extend into the
right midbrain. These infarcts involve the posterior circulation. An
additional curvilinear infarct extending from the right lentiform
nucleus towards the right caudate noted as well, favored to be
anterior distribution (series 6, image 76). Patchy restricted
diffusion also seen in the region of the left hypothalamus (series
6, image 69). Additional small cortical infarct noted at the
posterior right frontotemporal region (series 6, image 80).
Associated mild petechial hemorrhage at the left dorsal pons without
hemorrhagic transformation. Findings favored to be embolic in
nature.

Mild left a meningeal enhancement noted involving the parasagittal
left occipital region (series 29, image 7). While this finding could
be reactive in nature related to adjacent left PCA territory
infarct, a degree of underlying left a meningeal meningitis related
to infection could also be considered. Pituitary stalk is thickened
and enhancing as well.

Vascular: Major intracranial vascular flow voids are maintained.
Normal opacification seen throughout the major dural sinuses
following contrast administration.

Skull and upper cervical spine: Craniocervical junction within
normal limits. No transtentorial herniation. Bone marrow signal
intensity normal. No scalp soft tissue abnormality.

Sinuses/Orbits: Globes and orbital soft tissues within normal
limits. Extensive paranasal sinus disease involving the right
frontoethmoidal and right maxillary sinuses noted. Fluid seen within
the nasopharynx. Small bilateral mastoid effusions. Patient is
intubated.

Other: None.

MRA HEAD FINDINGS

ANTERIOR CIRCULATION:

Examination degraded by motion artifact. Visualized distal cervical
segments of the internal carotid arteries are patent with symmetric
antegrade flow. Petrous, cavernous, and supraclinoid ICAs patent
without stenosis or other abnormality. Left A1 patent. Right A1
hypoplastic and/or absent, accounting for the diminutive right ICA
is compared to the left. Widely patent azygos ACA. No M1 stenosis or
occlusion. Normal MCA bifurcations. Distal MCA branches well
perfused and symmetric.

POSTERIOR CIRCULATION:

Vertebral arteries patent to the vertebrobasilar junction without
stenosis. Right PICA patent. Left PICA not seen. Basilar mildly
irregular but patent to its distal aspect without high-grade
stenosis. Superior cerebral arteries patent bilaterally. Both PCAs
primarily supplied via the basilar. Irregularity about the PCAs
bilaterally with associated mild to moderate stenoses, greater on
the left. Specific note made of a moderate left P2 stenosis (series
28, image 14) PCAs remain well perfused to their distal aspects.

No aneurysm.
IMPRESSION: MRI HEAD IMPRESSION:

1. Postoperative changes from stereotactic aspiration/drainage of
left basal ganglia lesion without complication. Lesion is decreased
in size now measuring 5.1 x 2.4 x 2.7 cm. Per history, this is
consistent with and intracerebral abscess.
2. Persistent associated vasogenic edema throughout the left
cerebral hemisphere with mildly worsened 10 mm left-to-right shift.
3. Interval development of multifocal predominantly posterior
circulation infarcts, including a large left PCA territory infarct.
Minimal petechial hemorrhage at the left pons without hemorrhagic
transformation. A central thromboembolic source is suspected.
4. Patchy leptomeningeal enhancement within the posterior left
occipital region. While this may be reactive to the adjacent left
PCA territory infarct, a degree of leptomeningeal meningitis related
to intracerebral infection could also be considered.
5. Extensive right-sided paranasal sinus disease.

MRA HEAD IMPRESSION:

1. Negative intracranial MRA for large vessel occlusion.
2. Scattered vascular irregularity involving the basilar and
bilateral PCAs without occlusion. No proximal high-grade or
correctable stenosis. No aneurysm.

Findings discussed by called by telephone on [DATE] at
approximately [DATE] with provider Dr. EEMELI.

## 2020-03-24 IMAGING — MR MR HEAD WO/W CM
14 of 16 series · 40 of 48 positions shown · IV contrast (gadavist)
Comparison: Prior MRI from [DATE] and [DATE].

CLINICAL DATA: 54-year-old female with history of brain abscess,
status post stereotactic aspiration/drainage.

EXAM:
MRI HEAD WITHOUT AND WITH CONTRAST
MRA HEAD WITHOUT CONTRAST
TECHNIQUE: Multiplanar, multiecho pulse sequences of the brain and surrounding
structures were obtained without and with intravenous contrast.
Angiographic images of the head were obtained using MRA technique
without contrast.
CONTRAST:  6mL GADAVIST GADOBUTROL 1 MMOL/ML IV SOLN

[Series 6: DWI · axial · 3.0mm · 0.88mm/px · z∈[-84,+57]mm · 6 of 96 slices shown (1 of 4)]
[im 1/96]
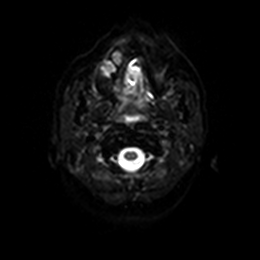
[im 20/96]
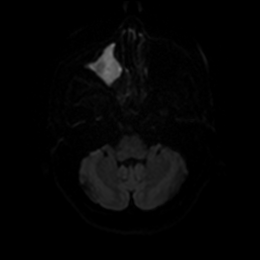
[im 39/96]
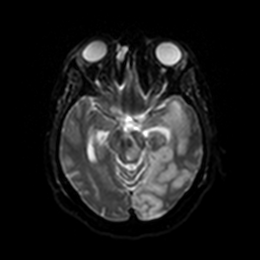
[im 58/96]
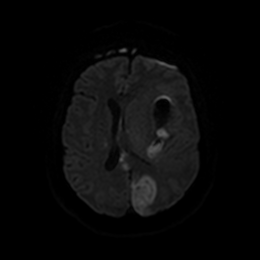
[im 77/96]
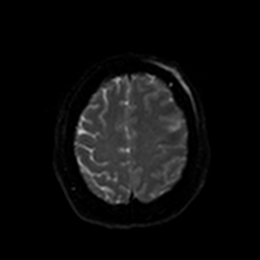
[im 96/96]
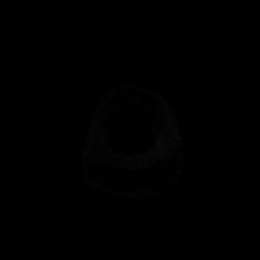

[Series 7: DWI · axial · 3.0mm · 0.88mm/px · z∈[-84,+57]mm · 2 of 48 slices shown (2 of 4)]
[im 1/48]
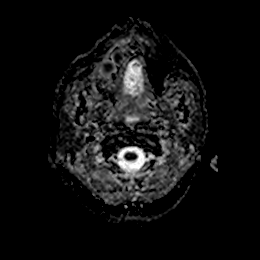
[im 48/48]
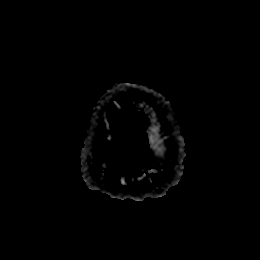

[Series 8: DWI · coronal · 4.0mm · 0.88mm/px · 3 of 66 slices shown (3 of 4)]
[im 1/66]
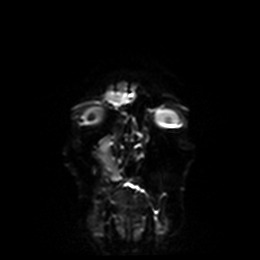
[im 33/66]
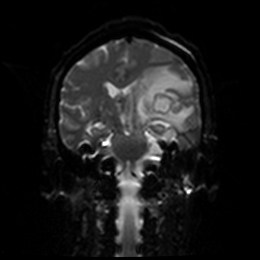
[im 66/66]
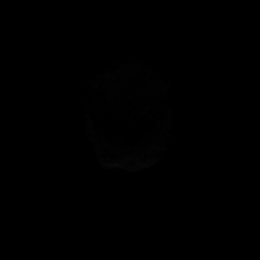

[Series 9: DWI · coronal · 4.0mm · 0.88mm/px · 2 of 33 slices shown (4 of 4)]
[im 1/33]
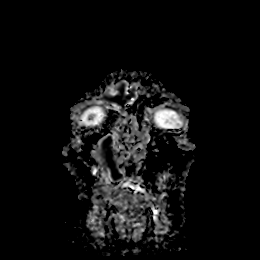
[im 33/33]
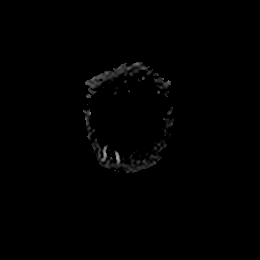

[Series 10: T1 · sagittal · 5.0mm · 0.72mm/px · 2 of 25 slices shown]
[im 1/25]
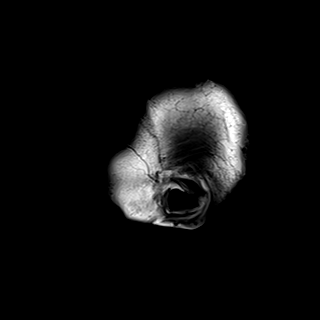
[im 25/25]
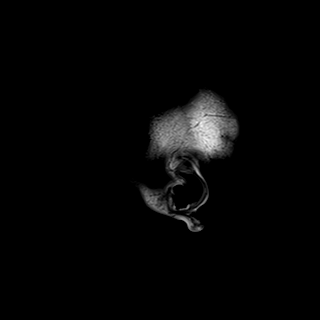

[Series 12: T2 · axial · 5.0mm · 0.69mm/px · z∈[-75,+69]mm · 2 of 25 slices shown]
[im 1/25]
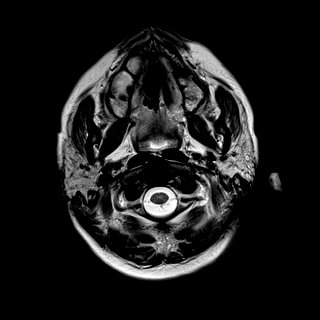
[im 25/25]
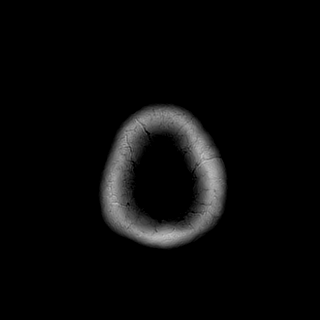

[Series 13: FLAIR · axial · 5.0mm · 0.86mm/px · z∈[-75,+69]mm · 2 of 25 slices shown]
[im 1/25]
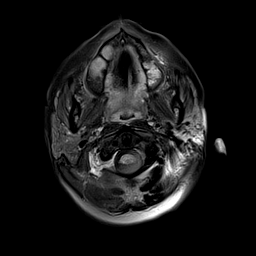
[im 25/25]
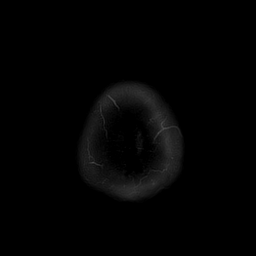

[Series 23: mag_images · axial · 3.0mm · 0.86mm/px · z∈[-76,+76]mm · 4 of 52 slices shown]
[im 1/52]
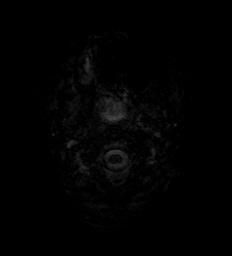
[im 18/52]
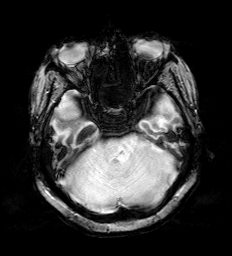
[im 35/52]
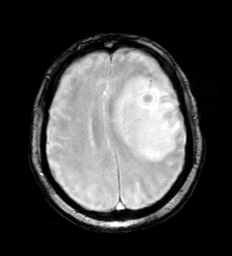
[im 52/52]
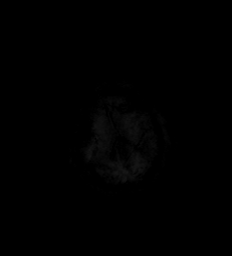

[Series 24: pha_images · axial · 3.0mm · 0.86mm/px · z∈[-76,+76]mm · 4 of 52 slices shown]
[im 1/52]
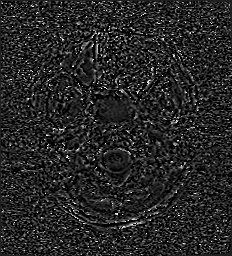
[im 18/52]
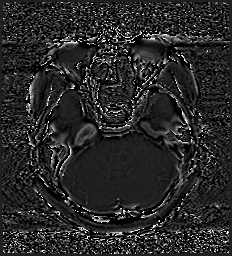
[im 35/52]
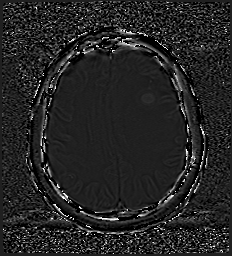
[im 52/52]
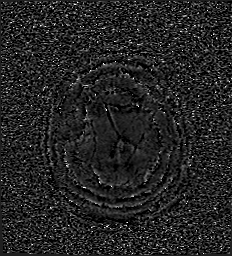

[Series 25: swi_images · axial · 3.0mm · 0.86mm/px · z∈[-76,+76]mm · 4 of 52 slices shown]
[im 1/52]
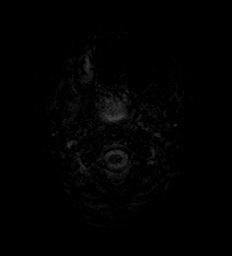
[im 18/52]
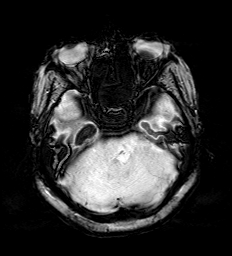
[im 35/52]
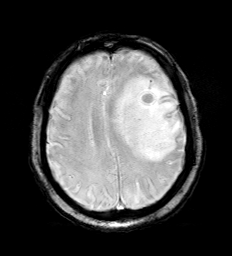
[im 52/52]
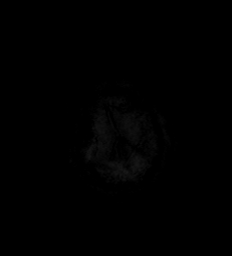

[Series 26: mip_images(sw) · axial · 24.0mm · 0.86mm/px · z∈[-66,+66]mm · 3 of 45 slices shown]
[im 1/45]
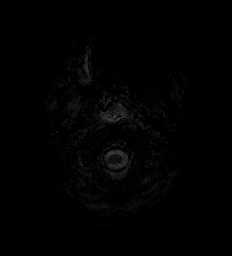
[im 23/45]
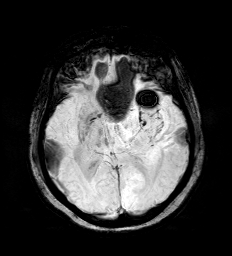
[im 45/45]
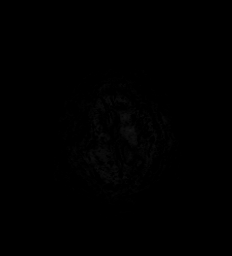

[Series 27: T2 post-contrast · coronal · 5.0mm · 0.72mm/px · 2 of 30 slices shown]
[im 1/30]
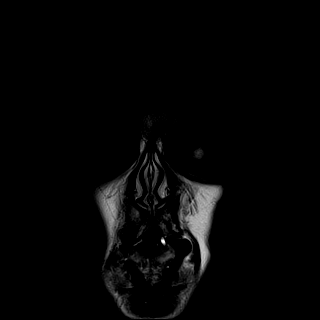
[im 30/30]
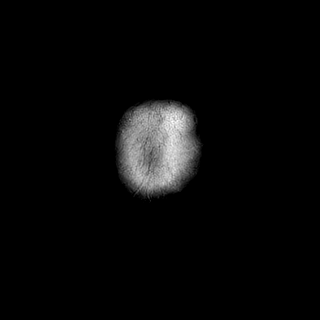

[Series 29: T1 post-contrast · coronal · 5.0mm · 0.34mm/px · 2 of 30 slices shown (1 of 2)]
[im 1/30]
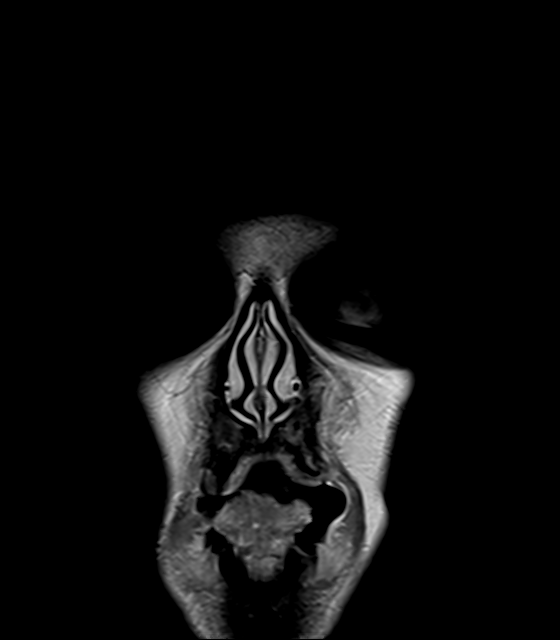
[im 30/30]
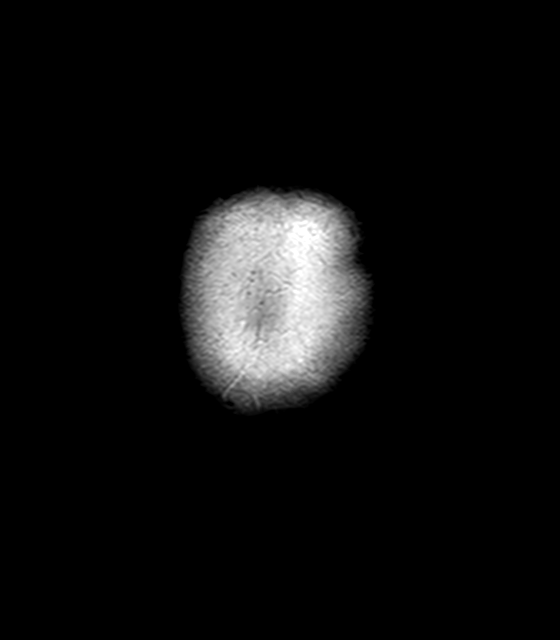

[Series 30: T1 post-contrast · sagittal · 5.0mm · 0.72mm/px · 2 of 25 slices shown (2 of 2)]
[im 1/25]
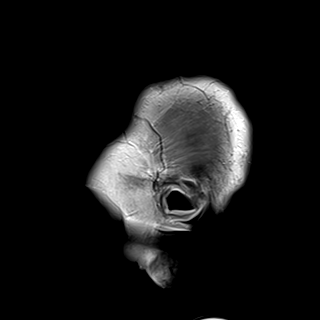
[im 25/25]
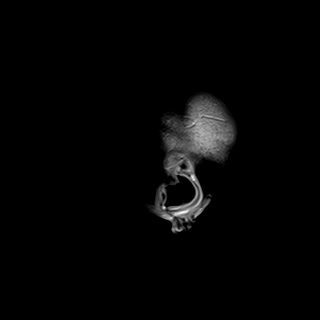

[40 of 48 positions shown; findings below may reference images not displayed]

FINDINGS: MRI HEAD FINDINGS

Brain: Postoperative changes from interval stereotactic aspiration
of previously identified lesion centered at the left basal ganglia
is seen. Aspiration tract with a small amount of susceptibility
artifacts seen extending via a left frontal approach. The lesion at
the left basal ganglia is slightly decreased in size now measuring
5.1 x 2.4 x 2.7 cm, previously 6.0 x 2.4 x 3.5 cm. Associated
internal fluid-fluid level with restricted diffusion. Small focus of
pneumocephalus at the nondependent portion of this lesion. Per
history, this lesion was found to be most consistent with an
intracerebral infection/abscess. Persistent surrounding vasogenic
edema throughout the adjacent left cerebral hemisphere with regional
mass effect and effacement of the left lateral ventricle. Edema
extends into the left cerebral peduncle and left midbrain, similar
to previous. Associated left-to-right shift has worsened now
measuring up to 10 mm, previously 6 mm. No overt hydrocephalus or
ventricular trapping at this time. Crowding of the basilar cisterns
with mass effect on the adjacent midbrain, similar to slightly
worsened. No transtentorial herniation.

1 cm right thalamic infarct is similar to previous. No associated
hemorrhage.

Interval development of confluent restricted diffusion throughout
the left temporal occipital region, left PCA distribution,
consistent with acute ischemia. No associated hemorrhage. Multiple
additional new patchy ischemic infarcts involving the bilateral
thalami, splenium, parasagittal right temporal occipital region, and
left dorsal pons. Right thalamic ischemic changes extend into the
right midbrain. These infarcts involve the posterior circulation. An
additional curvilinear infarct extending from the right lentiform
nucleus towards the right caudate noted as well, favored to be
anterior distribution (series 6, image 76). Patchy restricted
diffusion also seen in the region of the left hypothalamus (series
6, image 69). Additional small cortical infarct noted at the
posterior right frontotemporal region (series 6, image 80).
Associated mild petechial hemorrhage at the left dorsal pons without
hemorrhagic transformation. Findings favored to be embolic in
nature.

Mild left a meningeal enhancement noted involving the parasagittal
left occipital region (series 29, image 7). While this finding could
be reactive in nature related to adjacent left PCA territory
infarct, a degree of underlying left a meningeal meningitis related
to infection could also be considered. Pituitary stalk is thickened
and enhancing as well.

Vascular: Major intracranial vascular flow voids are maintained.
Normal opacification seen throughout the major dural sinuses
following contrast administration.

Skull and upper cervical spine: Craniocervical junction within
normal limits. No transtentorial herniation. Bone marrow signal
intensity normal. No scalp soft tissue abnormality.

Sinuses/Orbits: Globes and orbital soft tissues within normal
limits. Extensive paranasal sinus disease involving the right
frontoethmoidal and right maxillary sinuses noted. Fluid seen within
the nasopharynx. Small bilateral mastoid effusions. Patient is
intubated.

Other: None.

MRA HEAD FINDINGS

ANTERIOR CIRCULATION:

Examination degraded by motion artifact. Visualized distal cervical
segments of the internal carotid arteries are patent with symmetric
antegrade flow. Petrous, cavernous, and supraclinoid ICAs patent
without stenosis or other abnormality. Left A1 patent. Right A1
hypoplastic and/or absent, accounting for the diminutive right ICA
is compared to the left. Widely patent azygos ACA. No M1 stenosis or
occlusion. Normal MCA bifurcations. Distal MCA branches well
perfused and symmetric.

POSTERIOR CIRCULATION:

Vertebral arteries patent to the vertebrobasilar junction without
stenosis. Right PICA patent. Left PICA not seen. Basilar mildly
irregular but patent to its distal aspect without high-grade
stenosis. Superior cerebral arteries patent bilaterally. Both PCAs
primarily supplied via the basilar. Irregularity about the PCAs
bilaterally with associated mild to moderate stenoses, greater on
the left. Specific note made of a moderate left P2 stenosis (series
28, image 14) PCAs remain well perfused to their distal aspects.

No aneurysm.
IMPRESSION: MRI HEAD IMPRESSION:

1. Postoperative changes from stereotactic aspiration/drainage of
left basal ganglia lesion without complication. Lesion is decreased
in size now measuring 5.1 x 2.4 x 2.7 cm. Per history, this is
consistent with and intracerebral abscess.
2. Persistent associated vasogenic edema throughout the left
cerebral hemisphere with mildly worsened 10 mm left-to-right shift.
3. Interval development of multifocal predominantly posterior
circulation infarcts, including a large left PCA territory infarct.
Minimal petechial hemorrhage at the left pons without hemorrhagic
transformation. A central thromboembolic source is suspected.
4. Patchy leptomeningeal enhancement within the posterior left
occipital region. While this may be reactive to the adjacent left
PCA territory infarct, a degree of leptomeningeal meningitis related
to intracerebral infection could also be considered.
5. Extensive right-sided paranasal sinus disease.

MRA HEAD IMPRESSION:

1. Negative intracranial MRA for large vessel occlusion.
2. Scattered vascular irregularity involving the basilar and
bilateral PCAs without occlusion. No proximal high-grade or
correctable stenosis. No aneurysm.

Findings discussed by called by telephone on [DATE] at
approximately [DATE] with provider Dr. EEMELI.

## 2020-03-24 MED ORDER — INSULIN GLARGINE 100 UNIT/ML ~~LOC~~ SOLN
10.0000 [IU] | Freq: Every day | SUBCUTANEOUS | Status: DC
  Administered 2020-03-24 – 2020-03-27 (×4): 10 [IU] via SUBCUTANEOUS
  Filled 2020-03-24 (×5): qty 0.1

## 2020-03-24 MED ORDER — PANTOPRAZOLE SODIUM 40 MG PO PACK
40.0000 mg | PACK | Freq: Every day | ORAL | Status: DC
  Administered 2020-03-24 – 2020-05-03 (×38): 40 mg
  Filled 2020-03-24 (×38): qty 20

## 2020-03-24 MED ORDER — POTASSIUM CHLORIDE 20 MEQ PO PACK: 40.0000 meq | PACK | Freq: Two times a day (BID) | ORAL | Status: DC

## 2020-03-24 MED ORDER — POTASSIUM & SODIUM PHOSPHATES 280-160-250 MG PO PACK
1.0000 | PACK | Freq: Three times a day (TID) | ORAL | Status: AC
  Administered 2020-03-24 (×3): 1
  Filled 2020-03-24 (×3): qty 1

## 2020-03-24 MED ORDER — VANCOMYCIN HCL 1500 MG/300ML IV SOLN
1500.0000 mg | Freq: Three times a day (TID) | INTRAVENOUS | Status: DC
  Administered 2020-03-24: 1500 mg via INTRAVENOUS
  Filled 2020-03-24 (×2): qty 300

## 2020-03-24 MED ORDER — ACETAMINOPHEN 160 MG/5ML PO SOLN
650.0000 mg | ORAL | Status: DC | PRN
  Administered 2020-03-24 – 2020-04-24 (×3): 650 mg
  Filled 2020-03-24 (×5): qty 20.3

## 2020-03-24 MED ORDER — POTASSIUM CHLORIDE 20 MEQ PO PACK
40.0000 meq | PACK | Freq: Two times a day (BID) | ORAL | Status: AC
  Administered 2020-03-24 – 2020-03-25 (×2): 40 meq
  Filled 2020-03-24 (×2): qty 2

## 2020-03-24 MED ORDER — POTASSIUM CHLORIDE 20 MEQ/15ML (10%) PO SOLN
20.0000 meq | Freq: Three times a day (TID) | ORAL | Status: DC
  Administered 2020-03-24: 20 meq
  Filled 2020-03-24: qty 15

## 2020-03-24 MED ORDER — GADOBUTROL 1 MMOL/ML IV SOLN
6.0000 mL | Freq: Once | INTRAVENOUS | Status: AC | PRN
  Administered 2020-03-24: 6 mL via INTRAVENOUS

## 2020-03-24 NOTE — Progress Notes (Signed)
Spoke with Val Eagle NP on call for Neurosurgery about MRI.

## 2020-03-24 NOTE — Progress Notes (Signed)
No significant change in status.  Patient remains unconscious with no evidence of awakening.  Pupils are midposition and sluggish.  Corneal reflexes present.  Cough and gag still extends both extremities to noxious stimuli.  Follow-up MRI scan straits decrease in the size of the necrotic central cavity.  Still with a tremendous amount of surrounding edematous tissue consistent with profound cerebritis.  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  Patient critically ill with large dominant hemisphere deep brain abscess significant cerebritis and edema continue Decadron, antibiotics and hypertonic saline.  I do not see much value decompressive surgery at this point.

## 2020-03-24 NOTE — Progress Notes (Deleted)
eLink Physician-Brief Progress Note Patient Name: Vickie Alvarado DOB: 02/24/1966 MRN: [id number]   Date of Service  03/24/2020  HPI/Events of Note  Saturation is 74 - 77  %  on  BIPAP.  eICU Interventions  Stat ABG to assess oxygenation and provide objective data regarding need for intubation.        Thomasene Lot Breon Diss 03/24/2020, 6:13 AM

## 2020-03-24 NOTE — Progress Notes (Signed)
Pharmacy Antibiotic Note  Vickie Alvarado is a 54 y.o. female admitted on 03/20/2020 with unilateral weakness.  Pharmacy has been consulted for vancomycin dosing for brain abscess, dental abscess and PNA.  Renal function stable and vancomycin trough is sub-therapeutic at 6 mcg/mL (goal 15-20 mcg/mL).  Tmax 100.1, WBC down to 12.2.  Plan: Increase vanc to 1500mg  IV Q8H CTX 2gm IV Q12H Flagyl 500mg  IV Q8H Monitor renal fxn, clinical progress, repeat VT at new Css  Weight: 69.2 kg (152 lb 8.9 oz)   Temp (24hrs), Avg:99.8 F (37.7 C), Min:98.8 F (37.1 C), Max:100.7 F (38.2 C)  Recent Labs  Lab 03/20/20 1011 03/21/20 0616 03/22/20 0411 03/23/20 0437 03/24/20 0534 03/24/20 0839  WBC 9.5 12.0* 13.9* 15.0* 12.2*  --   CREATININE 0.44 0.56 0.44 0.47 0.43*  --   VANCOTROUGH  --   --   --   --   --  6*    Estimated Creatinine Clearance: 77.3 mL/min (A) (by C-G formula based on SCr of 0.43 mg/dL (L)).    Allergies  Allergen Reactions  . Ibuprofen Anaphylaxis    Vanc 8/27 >> CTX 8/27 >> Flagyl 8/27 >> Cefepime x1 8/27   8/29 VT = 6 mcg/mL on 1g q8 >> 1500mg  q6  8/26 UCx - negative 8/26 BCx - NGTD 8/27 MRSA PCR - negative 8/27 brain lesion - GPC on Gram stain  Vickie Alvarado D. 9/27, PharmD, BCPS, BCCCP 03/24/2020, 9:51 AM

## 2020-03-24 NOTE — Progress Notes (Signed)
Called CCM regarding MRI.

## 2020-03-24 NOTE — H&P (Signed)
NAME:  Vickie Alvarado, MRN:  [id number], DOB:  Oct 22, 1965, LOS: 4 ADMISSION DATE:  03/20/2020, CONSULTATION DATE:  03/22/20 REFERRING MD:  Dr. 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 S/p L frontal stereotactic biopsy/aspiration of the abscess and PCCM consulted to admit.  History of present illness   Patient is a 54 y/o F with a history of HTN, HLD, COVID-19 infection (03/01/20), dental infections, and sinus infections presented to [facility], after being found altered, with R sided weakness, dysarthria, and R facial droop. Patient had decreased  P.O. intake, decreased appetite, and projectile vomiting in addition to her other symptoms. MRI brain showed a 6.5 x 4.5 x 4 cm mass in the Left basal ganglia. Neurosurgery performed a L frontal stereotactic biopsy/aspiration of the abscess and PCCM consulted for admission to neurosurgical ICU.   Past Medical History  Anxiety  Bipolar Disorder Dental infection HTN HLD  Significant Hospital Events   8/25> Admitted 8/27> Aspiration of brain abscess, admitted ICU   Consults:  PCCM  Neurosurgery   Procedures:  8/27> L frontal stereotactic biopsy/aspiration of L basal ganglia mass  Significant Diagnostic Tests:  8/25 MR Brain> 6.5x4.5x4 cm mass in the epicenter of the L basal ganglia and radiating white matter tracts 8/28 MR Micro Data:  8/26 BC x2> NGTD 8/27 Gram Stain> sent 8/27 Aerobic/Anaerobe> sent 8/27 Fungus Culture with stain> Sent  8/27 Urine Culture<  <10k colonies    Antimicrobials:  8/27 Cefepime>> 8/27 8/27 Vanc>> 8/27 Metro>> 8/27 Ceftriaxone>> 8/27 Cefazolin>>8/27  Interim history/subjective:  Admitted following abscess drainage.  Exam unchanged off sedation.  MRI shows midline shift and new PCA  stroke.  Neurosurgery not recommending further intervention.   Objective   Blood pressure (!) 126/55, pulse 90, temperature 100.1 F (37.8 C), temperature source Axillary, resp. rate 19, weight 69.2 kg, SpO2 98 %.    Vent Mode: PRVC FiO2 (%):  [40 %] 40 % Set Rate:  [12 bmp] 12 bmp Vt Set:  [490 mL] 490 mL PEEP:  [5 cmH20] 5 cmH20 Plateau Pressure:  [11 cmH20-14 cmH20] 11 cmH20   Intake/Output Summary (Last 24 hours) at 03/24/2020 1149 Last data filed at 03/24/2020 1100 Gross per 24 hour  Intake 3312.04 ml  Output 2475 ml  Net 837.04 ml   Filed Weights   03/22/20 1200 03/23/20 0400  Weight: 88 kg 69.2 kg    Examination: Physical Exam Constitutional:      General: She is not in acute distress.    Appearance: She is not toxic-appearing or diaphoretic.     Interventions: She is sedated.  HENT:     Head: Normocephalic.     Comments: Sutures in place with no erythema, purulence, or drainage, R sided loss of the labial nasal fold.     Nose: Nose normal.  Eyes:     General: No scleral icterus.       Right eye: No discharge.        Left eye: No discharge.     Conjunctiva/sclera: Conjunctivae normal.     Pupils: Pupils are equal, round, and reactive to light.     Comments: Left gaze preference.   Cardiovascular:     Rate and Rhythm: Normal rate and regular rhythm.  Pulses: Normal pulses.     Heart sounds: Normal heart sounds. No murmur heard.  No friction rub. No gallop.   Pulmonary:     Comments: Bronchial air sounds appreciated bilaterally.  Abdominal:     General: Abdomen is flat.     Tenderness: There is no abdominal tenderness.  Musculoskeletal:     Right lower leg: No edema.     Left lower leg: No edema.  Neurological:     Comments: Cerebrate posturing bilaterally.     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  Acute toxic metabolic encephalopathy Hypertension Dyslipidemia Bipolar disorder  Plan:  Continue full mechanical ventilatory support, avoid hypercarbia. Keep patient sedated to prevent ICP spikes with coughing.  3% saline to keep sodium around 150. Bolus 23% if exam worsens Continue current antibiotic coverage until cultures finalize.  No possible stroke intervention.   Best practice:  Diet: NPO Pain/Anxiety/Delirium protocol (if indicated): Holding home medications DVT prophylaxis: SCDs Glucose control: SSI  Mobility: Bed rest  Code Status: FULL  Family Communication: updated daughter at the bedside and informed her that we at the limits of what we could realistically offer and that signs of cerebral herniation would portend a poor prognosis.  She is realistic as to prognosis.  Disposition: ICU  Labs   CBC: Recent Labs  Lab 03/20/20 1011 03/20/20 1011 03/21/20 0616 03/21/20 0616 03/22/20 0411 03/22/20 0411 03/22/20 1512 03/23/20 0131 03/23/20 0437 03/23/20 0525 03/24/20 0534  WBC 9.5  --  12.0*  --  13.9*  --   --   --  15.0*  --  12.2*  NEUTROABS 6.7  --   --   --   --   --   --   --   --   --  9.9*  HGB 12.5   < > 12.7   < > 13.3   < > 10.5* 12.9 11.8* 11.2* 10.3*  HCT 37.6   < > 37.4   < > 39.0   < > 31.0* 38.0 34.3* 33.0* 31.5*  MCV 88.1  --  86.6  --  85.9  --   --   --  86.0  --  90.0  PLT 360  --  338  --  363  --   --   --  315  --  247   < > = values in this interval not displayed.    Basic Metabolic Panel: Recent Labs  Lab 03/20/20 1011 03/20/20 1011 03/21/20 0616 03/21/20 0616 03/22/20 0411 03/22/20 0411 03/22/20 1512 03/23/20 0004 03/23/20 0131 03/23/20 0131 03/23/20 0437 03/23/20 0437 03/23/20 0525 03/23/20 1048 03/23/20 1732 03/23/20 2355 03/24/20 0534  NA 134*   < >  135   < > 134*   < > 143   < > 135   < > 137   < > 139 138 144 147* 147*  K 3.6   < > 3.7   < > 3.6   < > 2.9*  --  2.5*  --  4.1  --  3.9  --   --   --  2.9*  CL 97*  --  101  --  103  --   --   --   --   --  106  --   --   --   --   --  118*  CO2 24  --  20*  --  17*  --   --   --   --   --  20*  --   --   --   --   --  21*  GLUCOSE 215*  --  162*  --  173*  --   --   --   --   --  206*  --   --   --   --   --  182*  BUN 11  --  11  --  11  --   --   --   --   --  6  --   --   --   --   --  13  CREATININE 0.44  --  0.56  --  0.44  --   --   --   --   --  0.47  --   --   --   --   --  0.43*  CALCIUM 9.3  --  9.0  --  8.9  --   --   --   --   --  8.0*  --   --   --   --   --  8.0*  MG  --   --   --   --  1.9  --   --   --   --   --   --   --   --  1.8 2.1  --  2.0  PHOS  --   --   --   --   --   --   --   --   --   --   --   --   --  2.0* 2.3*  --  2.3*   < > = values in this interval not displayed.   GFR: Estimated Creatinine Clearance: 77.3 mL/min (A) (by C-G formula based on SCr of 0.43 mg/dL (L)). Recent Labs  Lab 03/21/20 0616 03/22/20 0411 03/23/20 0437 03/24/20 0534  WBC 12.0* 13.9* 15.0* 12.2*    Liver Function Tests: Recent Labs  Lab 03/20/20 1011 03/21/20 0616 03/24/20 0534  AST 11* 9* 11*  ALT 14 12 12   ALKPHOS 103 93 60  BILITOT 0.6 0.7 0.5  PROT 7.2 6.7 5.1*  ALBUMIN 3.2* 3.0* 2.3*   No results for input(s): LIPASE, AMYLASE in the last 168 hours. No results for input(s): AMMONIA in the last 168 hours.  ABG    Component Value Date/Time   PHART 7.458 (H) 03/23/2020 0525   PCO2ART 29.1 (L) 03/23/2020 0525   PO2ART 106 03/23/2020 0525   HCO3 20.6 03/23/2020 0525   TCO2 21 (L) 03/23/2020 0525   ACIDBASEDEF 2.0 03/23/2020 0525   O2SAT 98.0 03/23/2020 0525     Coagulation Profile: Recent Labs  Lab 03/20/20 1011  INR 1.2    Cardiac Enzymes: No results for input(s): CKTOTAL, CKMB, CKMBINDEX, TROPONINI in the last 168 hours.  HbA1C: Hgb A1c MFr Bld   Date/Time Value Ref Range Status  03/22/2020 04:55 PM 11.5 (H) 4.8 - 5.6 % Final    Comment:    (NOTE) Pre diabetes:          5.7%-6.4%  Diabetes:              >6.4%  Glycemic control for   <7.0% adults with diabetes  CBG: Recent Labs  Lab 03/23/20 1931 03/23/20 2313 03/24/20 0430 03/24/20 0811 03/24/20 1133  GLUCAP 180* 174* 138* 177* 224*   CRITICAL CARE Performed by: Lynnell Catalanavi Lache Dagher   Total critical care time: 40 minutes  Critical care time was exclusive of separately billable procedures and treating other patients.  Critical care was necessary to treat or prevent imminent or life-threatening deterioration.  Critical care was time spent personally by me on the following activities: development of treatment plan with patient and/or surrogate as well as nursing, discussions with consultants, evaluation of patient's response to treatment, examination of patient, obtaining history from patient or surrogate, ordering and performing treatments and interventions, ordering and review of laboratory studies, ordering and review of radiographic studies, pulse oximetry, re-evaluation of patient's condition and participation in multidisciplinary rounds.  Lynnell Catalanavi Fayne Mcguffee, MD [facility] ICU Physician [facility]  Pager: [phone] Mobile: [phone] After hours: [phone].

## 2020-03-24 NOTE — Progress Notes (Signed)
eLink Physician-Brief Progress Note Patient Name: Vickie Alvarado DOB: Oct 23, 1965 MRN: [id number]   Date of Service  03/24/2020  HPI/Events of Note  Patient with a brain abscess from a dental abscess who now has a left PCA territory  CVA  eICU Interventions  Neurology was consulted and is seeing the patient.        Migdalia Dk 03/24/2020, 6:39 AM

## 2020-03-24 NOTE — Progress Notes (Signed)
[facility]    Date of Admission:  03/20/2020   Total days of antibiotics 3           ID: Vickie Alvarado is a 54 y.o. female with large brain abscess (odontogenic) with cerebritis Principal Problem:   Brain mass Active Problems:   Weakness of extremity   Essential hypertension   Hyperlipidemia   Anxiety   Bipolar disorder (HCC)   Cerebral embolism with cerebral infarction    Subjective: Worsening overall status in the last 2 days. MRI imaging this morning shows not only persistent surrounding vasogenic edema throughout the adjancent left cerebral mehisphere with regional mass effect and effacement of the left lateral ventricle-- has worsened left to right shift to 80m (yesteray at 625m with crowding of the basilar cisterns with mass effect on the adjacent midbrain with Left PCA distribution infarcts involving posterior circulation.. Also has 1cm right thalamic infarct.--all consistent with herniation. Seen by NSGY, neurology and PCCM. Her daughter has been updated on dire situation.  Patient remains intubated, unresponsive.  Medications:  . chlorhexidine gluconate (MEDLINE KIT)  15 mL Mouth Rinse BID  . Chlorhexidine Gluconate Cloth  6 each Topical Daily  . dexamethasone (DECADRON) injection  10 mg Intravenous Q6H  . docusate  100 mg Per Tube BID  . feeding supplement (PROSource TF)  45 mL Per Tube TID  . insulin aspart  0-20 Units Subcutaneous Q4H  . insulin glargine  10 Units Subcutaneous Daily  . mouth rinse  15 mL Mouth Rinse 10 times per day  . pantoprazole sodium  40 mg Per Tube QHS  . polyethylene glycol  17 g Per Tube Daily  . potassium & sodium phosphates  1 packet Per Tube TID WC & HS  . potassium chloride  40 mEq Per Tube BID  . sodium chloride flush  10-40 mL Intracatheter Q12H    Objective: Vital signs in last 24 hours: Temp:  [98.8 F (37.1 C)-100.7 F (38.2 C)] 99.5 F (37.5 C) (08/29 1200) Pulse Rate:  [59-126] 90 (08/29  1115) Resp:  [13-124] 19 (08/29 1115) BP: (104-181)/(53-70) 126/55 (08/29 1100) SpO2:  [98 %-100 %] 98 % (08/29 1115) Arterial Line BP: (129-159)/(50-66) 141/58 (08/29 1045) FiO2 (%):  [40 %] 40 % (08/29 0823)  Physical Exam  Constitutional:  Sedated and intubated. appears well-developed and well-nourished. No distress.  HENT: [place]/AT, pupils no reacting to light Mouth/Throat: OETT in place Cardiovascular: Normal rate, regular rhythm and normal heart sounds. Exam reveals no gallop and no friction rub.  No murmur heard.  Pulmonary/Chest: Effort normal and breath sounds normal. No respiratory distress.  has no wheezes.  Abdominal: Soft. Bowel sounds are decreased. exhibits no distension. There is no tenderness.  Skin: Skin is warm and dry. No rash noted. No erythema.   Lab Results Recent Labs    03/23/20 0437 03/23/20 0437 03/23/20 0525 03/23/20 1048 03/24/20 0534 03/24/20 1130  WBC 15.0*  --   --   --  12.2*  --   HGB 11.8*   < > 11.2*  --  10.3*  --   HCT 34.3*   < > 33.0*  --  31.5*  --   NA 137   < > 139   < > 147* 150*  K 4.1   < > 3.9  --  2.9*  --   CL 106  --   --   --  118*  --   CO2 20*  --   --   --  21*  --   BUN 6  --   --   --  13  --   CREATININE 0.47  --   --   --  0.43*  --    < > = values in this interval not displayed.   Liver Panel Recent Labs    03/24/20 0534  PROT 5.1*  ALBUMIN 2.3*  AST 11*  ALT 12  ALKPHOS 60  BILITOT 0.5    Microbiology: Streptococcus intermedius(pan sensitive) Studies/Results: CT HEAD WO CONTRAST  Result Date: 03/22/2020 CLINICAL DATA:  Mental status change. Recent diagnosis of brain lesion post biopsy. EXAM: CT HEAD WITHOUT CONTRAST TECHNIQUE: Contiguous axial images were obtained from the base of the skull through the vertex without intravenous contrast. COMPARISON:  CT 8 hours ago.  Brain MRI earlier today. FINDINGS: Brain: Stable size and appearance of the left brain lesion centered in the basal ganglia. Stable internal  air or Gel-Foam. Stable regional mass effect. Left-to-right midline shift of 9 mm, previously 11 mm. No evidence of lesional or acute hemorrhage. Stable sulcal effacement. Lacunar infarct in the right thalamus better appreciated on prior MRI. Stable ventricular size. Basilar cisterns are patent. No developing subdural collection. Vascular: No hyperdense vessel. Skull: Left frontal burr hole.  No fracture or focal lesion. Sinuses/Orbits: Unchanged appearance of right maxillary and frontal sinusitis with opacification of anterior ethmoid air cells. Other: Post biopsy changes in the left frontal scalp. IMPRESSION: 1. Stable size and appearance of the left brain lesion centered in the basal ganglia with internal air or Gel-Foam. Stable regional mass effect. Slightly diminished left-to-right midline shift of 9 mm, previously 11 mm. 2. No evidence of hemorrhage or new abnormality. Electronically Signed   By: Keith Rake M.D.   On: 03/22/2020 23:33   CT HEAD WO CONTRAST  Result Date: 03/22/2020 CLINICAL DATA:  Follow-up brain biopsy. EXAM: CT HEAD WITHOUT CONTRAST TECHNIQUE: Contiguous axial images were obtained from the base of the skull through the vertex without intravenous contrast. COMPARISON:  MRI earlier same day.  CT yesterday. FINDINGS: Brain: Left frontal burr hole. Biopsy site with air or Gel-Foam in the region of the previous tumor with the epicenter in the left basal ganglia. No evidence of postsurgical hemorrhage. No change in regional mass effect with left-to-right shift 11 mm. No extra-axial collection. Vascular: No abnormal vascular finding. Skull: Otherwise negative Sinuses/Orbits: Ostiomeatal unit pattern of the paranasal sinuses on the right. Other: None IMPRESSION: Biopsy site with air or Gel-Foam in the region of the previous tumor with the epicenter in the left basal ganglia. No evidence of postsurgical hemorrhage. No change in regional mass effect with left-to-right shift 11 mm.  Electronically Signed   By: Nelson Chimes M.D.   On: 03/22/2020 15:58   MR ANGIO HEAD WO CONTRAST  Result Date: 03/24/2020 CLINICAL DATA:  54 year old female with history of brain abscess, status post stereotactic aspiration/drainage. EXAM: MRI HEAD WITHOUT AND WITH CONTRAST MRA HEAD WITHOUT CONTRAST TECHNIQUE: Multiplanar, multiecho pulse sequences of the brain and surrounding structures were obtained without and with intravenous contrast. Angiographic images of the head were obtained using MRA technique without contrast. CONTRAST:  21m GADAVIST GADOBUTROL 1 MMOL/ML IV SOLN COMPARISON:  Prior MRI from 03/22/2020 and 03/20/2020. FINDINGS: MRI HEAD FINDINGS Brain: Postoperative changes from interval stereotactic aspiration of previously identified lesion centered at the left basal ganglia is seen. Aspiration tract with a small amount of susceptibility artifacts seen extending via a left frontal approach. The lesion at the left basal  ganglia is slightly decreased in size now measuring 5.1 x 2.4 x 2.7 cm, previously 6.0 x 2.4 x 3.5 cm. Associated internal fluid-fluid level with restricted diffusion. Small focus of pneumocephalus at the nondependent portion of this lesion. Per history, this lesion was found to be most consistent with an intracerebral infection/abscess. Persistent surrounding vasogenic edema throughout the adjacent left cerebral hemisphere with regional mass effect and effacement of the left lateral ventricle. Edema extends into the left cerebral peduncle and left midbrain, similar to previous. Associated left-to-right shift has worsened now measuring up to 10 mm, previously 6 mm. No overt hydrocephalus or ventricular trapping at this time. Crowding of the basilar cisterns with mass effect on the adjacent midbrain, similar to slightly worsened. No transtentorial herniation. 1 cm right thalamic infarct is similar to previous. No associated hemorrhage. Interval development of confluent restricted  diffusion throughout the left temporal occipital region, left PCA distribution, consistent with acute ischemia. No associated hemorrhage. Multiple additional new patchy ischemic infarcts involving the bilateral thalami, splenium, parasagittal right temporal occipital region, and left dorsal pons. Right thalamic ischemic changes extend into the right midbrain. These infarcts involve the posterior circulation. An additional curvilinear infarct extending from the right lentiform nucleus towards the right caudate noted as well, favored to be anterior distribution (series 6, image 76). Patchy restricted diffusion also seen in the region of the left hypothalamus (series 6, image 69). Additional small cortical infarct noted at the posterior right frontotemporal region (series 6, image 80). Associated mild petechial hemorrhage at the left dorsal pons without hemorrhagic transformation. Findings favored to be embolic in nature. Mild left a meningeal enhancement noted involving the parasagittal left occipital region (series 29, image 7). While this finding could be reactive in nature related to adjacent left PCA territory infarct, a degree of underlying left a meningeal meningitis related to infection could also be considered. Pituitary stalk is thickened and enhancing as well. Vascular: Major intracranial vascular flow voids are maintained. Normal opacification seen throughout the major dural sinuses following contrast administration. Skull and upper cervical spine: Craniocervical junction within normal limits. No transtentorial herniation. Bone marrow signal intensity normal. No scalp soft tissue abnormality. Sinuses/Orbits: Globes and orbital soft tissues within normal limits. Extensive paranasal sinus disease involving the right frontoethmoidal and right maxillary sinuses noted. Fluid seen within the nasopharynx. Small bilateral mastoid effusions. Patient is intubated. Other: None. MRA HEAD FINDINGS ANTERIOR CIRCULATION:  Examination degraded by motion artifact. Visualized distal cervical segments of the internal carotid arteries are patent with symmetric antegrade flow. Petrous, cavernous, and supraclinoid ICAs patent without stenosis or other abnormality. Left A1 patent. Right A1 hypoplastic and/or absent, accounting for the diminutive right ICA is compared to the left. Widely patent azygos ACA. No M1 stenosis or occlusion. Normal MCA bifurcations. Distal MCA branches well perfused and symmetric. POSTERIOR CIRCULATION: Vertebral arteries patent to the vertebrobasilar junction without stenosis. Right PICA patent. Left PICA not seen. Basilar mildly irregular but patent to its distal aspect without high-grade stenosis. Superior cerebral arteries patent bilaterally. Both PCAs primarily supplied via the basilar. Irregularity about the PCAs bilaterally with associated mild to moderate stenoses, greater on the left. Specific note made of a moderate left P2 stenosis (series 28, image 14) PCAs remain well perfused to their distal aspects. No aneurysm. IMPRESSION: MRI HEAD IMPRESSION: 1. Postoperative changes from stereotactic aspiration/drainage of left basal ganglia lesion without complication. Lesion is decreased in size now measuring 5.1 x 2.4 x 2.7 cm. Per history, this is consistent with and intracerebral  abscess. 2. Persistent associated vasogenic edema throughout the left cerebral hemisphere with mildly worsened 10 mm left-to-right shift. 3. Interval development of multifocal predominantly posterior circulation infarcts, including a large left PCA territory infarct. Minimal petechial hemorrhage at the left pons without hemorrhagic transformation. A central thromboembolic source is suspected. 4. Patchy leptomeningeal enhancement within the posterior left occipital region. While this may be reactive to the adjacent left PCA territory infarct, a degree of leptomeningeal meningitis related to intracerebral infection could also be  considered. 5. Extensive right-sided paranasal sinus disease. MRA HEAD IMPRESSION: 1. Negative intracranial MRA for large vessel occlusion. 2. Scattered vascular irregularity involving the basilar and bilateral PCAs without occlusion. No proximal high-grade or correctable stenosis. No aneurysm. Findings discussed by called by telephone on 03/24/2020 at approximately 5:30 am with provider Dr. Rory Percy. Electronically Signed   By: Jeannine Boga M.D.   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 EXAM: MRI HEAD WITHOUT AND WITH CONTRAST MRA HEAD WITHOUT CONTRAST TECHNIQUE: Multiplanar, multiecho pulse sequences of the brain and surrounding structures were obtained without and with intravenous contrast. Angiographic images of the head were obtained using MRA technique without contrast. CONTRAST:  74m GADAVIST GADOBUTROL 1 MMOL/ML IV SOLN COMPARISON:  Prior MRI from 03/22/2020 and 03/20/2020. FINDINGS: MRI HEAD FINDINGS Brain: Postoperative changes from interval stereotactic aspiration of previously identified lesion centered at the left basal ganglia is seen. Aspiration tract with a small amount of susceptibility artifacts seen extending via a left frontal approach. The lesion at the left basal ganglia is slightly decreased in size now measuring 5.1 x 2.4 x 2.7 cm, previously 6.0 x 2.4 x 3.5 cm. Associated internal fluid-fluid level with restricted diffusion. Small focus of pneumocephalus at the nondependent portion of this lesion. Per history, this lesion was found to be most consistent with an intracerebral infection/abscess. Persistent surrounding vasogenic edema throughout the adjacent left cerebral hemisphere with regional mass effect and effacement of the left lateral ventricle. Edema extends into the left cerebral peduncle and left midbrain, similar to previous.  Associated left-to-right shift has worsened now measuring up to 10 mm, previously 6 mm. No overt hydrocephalus or ventricular trapping at this time. Crowding of the basilar cisterns with mass effect on the adjacent midbrain, similar to slightly worsened. No transtentorial herniation. 1 cm right thalamic infarct is similar to previous. No associated hemorrhage. Interval development of confluent restricted diffusion throughout the left temporal occipital region, left PCA distribution, consistent with acute ischemia. No associated hemorrhage. Multiple additional new patchy ischemic infarcts involving the bilateral thalami, splenium, parasagittal right temporal occipital region, and left dorsal pons. Right thalamic ischemic changes extend into the right midbrain. These infarcts involve the posterior circulation. An additional curvilinear infarct extending from the right lentiform nucleus towards the right caudate noted as well, favored to be anterior distribution (series 6, image 76). Patchy restricted diffusion also seen in the region of the left hypothalamus (series 6, image 69). Additional small cortical infarct noted at the posterior right frontotemporal region (series 6, image 80). Associated mild petechial hemorrhage at the left dorsal pons without hemorrhagic transformation. Findings favored to be embolic in nature. Mild left a meningeal enhancement noted involving the parasagittal left occipital region (series 29, image 7). While this finding could be reactive in nature related to adjacent left PCA territory infarct, a degree of underlying left a meningeal meningitis related to infection could also  be considered. Pituitary stalk is thickened and enhancing as well. Vascular: Major intracranial vascular flow voids are maintained. Normal opacification seen throughout the major dural sinuses following contrast administration. Skull and upper cervical spine: Craniocervical junction within normal limits. No  transtentorial herniation. Bone marrow signal intensity normal. No scalp soft tissue abnormality. Sinuses/Orbits: Globes and orbital soft tissues within normal limits. Extensive paranasal sinus disease involving the right frontoethmoidal and right maxillary sinuses noted. Fluid seen within the nasopharynx. Small bilateral mastoid effusions. Patient is intubated. Other: None. MRA HEAD FINDINGS ANTERIOR CIRCULATION: Examination degraded by motion artifact. Visualized distal cervical segments of the internal carotid arteries are patent with symmetric antegrade flow. Petrous, cavernous, and supraclinoid ICAs patent without stenosis or other abnormality. Left A1 patent. Right A1 hypoplastic and/or absent, accounting for the diminutive right ICA is compared to the left. Widely patent azygos ACA. No M1 stenosis or occlusion. Normal MCA bifurcations. Distal MCA branches well perfused and symmetric. POSTERIOR CIRCULATION: Vertebral arteries patent to the vertebrobasilar junction without stenosis. Right PICA patent. Left PICA not seen. Basilar mildly irregular but patent to its distal aspect without high-grade stenosis. Superior cerebral arteries patent bilaterally. Both PCAs primarily supplied via the basilar. Irregularity about the PCAs bilaterally with associated mild to moderate stenoses, greater on the left. Specific note made of a moderate left P2 stenosis (series 28, image 14) PCAs remain well perfused to their distal aspects. No aneurysm. IMPRESSION: MRI HEAD IMPRESSION: 1. Postoperative changes from stereotactic aspiration/drainage of left basal ganglia lesion without complication. Lesion is decreased in size now measuring 5.1 x 2.4 x 2.7 cm. Per history, this is consistent with and intracerebral abscess. 2. Persistent associated vasogenic edema throughout the left cerebral hemisphere with mildly worsened 10 mm left-to-right shift. 3. Interval development of multifocal predominantly posterior circulation infarcts,  including a large left PCA territory infarct. Minimal petechial hemorrhage at the left pons without hemorrhagic transformation. A central thromboembolic source is suspected. 4. Patchy leptomeningeal enhancement within the posterior left occipital region. While this may be reactive to the adjacent left PCA territory infarct, a degree of leptomeningeal meningitis related to intracerebral infection could also be considered. 5. Extensive right-sided paranasal sinus disease. MRA HEAD IMPRESSION: 1. Negative intracranial MRA for large vessel occlusion. 2. Scattered vascular irregularity involving the basilar and bilateral PCAs without occlusion. No proximal high-grade or correctable stenosis. No aneurysm. Findings discussed by called by telephone on 03/24/2020 at approximately 5:30 am with provider Dr. Rory Percy. Electronically Signed   By: Jeannine Boga M.D.   On: 03/24/2020 06:44   Portable Chest x-ray  Result Date: 03/23/2020 CLINICAL DATA:  54 year old female status post intubation. EXAM: PORTABLE CHEST 1 VIEW COMPARISON:  Chest CT dated 03/20/2020. FINDINGS: Endotracheal tube with tip at the level of the carina tilting towards the right mainstem bronchus. Recommend retraction by approximately 4 cm for optimal positioning. Enteric tube extends below the diaphragm with tip beyond the inferior margin of the image. Bilateral streaky and hazy airspace densities may represent atelectasis or pneumonia. No large pleural effusion. No pneumothorax. The cardiac silhouette is within limits. No acute osseous pathology. IMPRESSION: 1. Endotracheal tube with tip at the level of the carina tilting towards the right mainstem bronchus. Recommend retraction by 4 cm for optimal positioning. 2. Bilateral streaky and hazy airspace densities may represent atelectasis or pneumonia. These results were called by telephone at the time of interpretation on 03/23/2020 at 12:57 am to nurse Purcell Nails, who verbally acknowledged these  results. Electronically Signed   By: Milas Hock  Radparvar M.D.   On: 03/23/2020 01:03   [place] EKG SITE RITE  Result Date: 03/23/2020 If [facility] image not attached, placement could not be confirmed due to current cardiac rhythm.    Assessment/Plan: Large Brain abscess with severe surrounding vasogenic edema c/b posterior circulation infarcts and herniation- patient remains obtunded  - from abtx standpoint = keep on ceftriaxone 69m IV daily  Cerebral edema/herniation = remains intubated, hyperventilated, and on hypertonic saline - NSGY did not feel prognosis would improve with decompression given current findings on physical exam and imaging. Agree with PCCM- prognosis is poor  Recommend palliative care support for the family.  Will sign off  [facility] Cell: [phone]: [phone]  03/24/2020, 1:40 PM

## 2020-03-24 NOTE — Consult Note (Addendum)
Neurology Consultation  Reason for Consult: Stroke on MRI Referring Physician: Dr. 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 female past medical history of anxiety bipolar disorder, extensive periodontal disease, hypertension, hyperlipidemia, awaiting to undergo a dental procedure on 03/01/2020 but was found to be febrile at the time of dental visit but could not get the procedure done due to being tested positive for COVID-19, given instructions to self isolate for 14 days brought to the ED on 03/20/2020 because the family that checked on him thought she might be having a stroke given her altered mental status and significant right-sided weakness and right facial droop along with dysarthria. Brain imaging in the emergency room showed a 6.5 x 4.5 x 4 cm mass in the left basal ganglia-initial differential tumor versus abscess.  Neurosurgery consulted for biopsy, preliminary pathology concerning for purulent material consistent with abscess. She was extubated after the stereotactic biopsy on 03/22/2020 but had to be reintubated due to change in mental status later that night, in the early morning hours of 03/23/2020.  CT head did not show any concerning changes but due to the change in her mentation, MRI brain was ordered, which was just completed a little while ago that showed a large left PCA infarct along with scattered infarcts in the right basal ganglia and right thalamus. Neurological consultation obtained upon discovery of these new findings on imaging.  Patient is currently being followed by Dr. Graylon Good from infectious diseases.  She is on antibiotics-vancomycin, ceftriaxone and Flagyl.  Also running on 3% saline. Repeat MRI with stable shift, no LVO on MRA.   LKW: Unclear-prior to presentation on 03/20/2020 but then change again in mentation requiring  intubation on 827 on 03/23/2020. tpa given?: no, outside the window Premorbid modified Rankin scale (mRS): Unable to ascertain  ROS: Unable to obtain due to patient's mentation.   Past Medical History:  Diagnosis Date  . Anxiety   . Bipolar disorder ([place])   . Dental caries    periodontal disease, exostosis left maxilla  . GERD (gastroesophageal reflux disease)   . Hypercholesterolemia   . Hypertension   . Seasonal allergies   . Wears dentures   . Wears glasses      History reviewed. No pertinent family history.  Social History:   reports that she has been smoking cigarettes. She has been smoking about 0.25 packs per day. She has never used smokeless tobacco. She reports that she does not drink alcohol and does not use drugs.  Medications  Current Facility-Administered Medications:  .  acetaminophen (TYLENOL) tablet 650 mg, 650 mg, Oral, Q4H PRN **OR** acetaminophen (TYLENOL) suppository 650 mg, 650 mg, Rectal, Q4H PRN, Dawley, Troy C, DO .  cefTRIAXone (ROCEPHIN) 2 g in sodium chloride 0.9 % 100 mL IVPB, 2 g, Intravenous, Q12H, Carlyle Basques, MD, Stopped at 03/23/20 2243 .  chlorhexidine gluconate (MEDLINE KIT) (PERIDEX) 0.12 % solution 15 mL, 15 mL, Mouth Rinse, BID, Chand, Sudham, MD, 15 mL at 03/23/20 1950 .  Chlorhexidine Gluconate Cloth 2 % PADS 6 each, 6 each, Topical, Daily, Jacky Kindle, MD, 6 each at 03/24/20 0000 .  clevidipine (CLEVIPREX) infusion 0.5 mg/mL, 0-21 mg/hr, Intravenous, Continuous, Agarwala, Ravi, MD, Last Rate: 4 mL/hr at 03/24/20 0508, 2 mg/hr at 03/24/20 0508 .  dexamethasone (DECADRON) injection 10 mg, 10 mg, Intravenous, Q6H, Bergman, Meghan D, NP, 10  mg at 03/24/20 0538 .  docusate (COLACE) 50 MG/5ML liquid 100 mg, 100 mg, Per Tube, BID, Jacky Kindle, MD, 100 mg at 03/23/20 2220 .  feeding supplement (PROSource TF) liquid 45 mL, 45 mL, Per Tube, TID, Agarwala, Ravi, MD, 45 mL at 03/23/20 2218 .  feeding supplement (VITAL HIGH PROTEIN) liquid 1,000  mL, 1,000 mL, Per Tube, Continuous, Agarwala, Ravi, MD, Last Rate: 45 mL/hr at 03/23/20 1727, 1,000 mL at 03/23/20 1727 .  fentaNYL (SUBLIMAZE) injection 50 mcg, 50 mcg, Intravenous, Q15 min PRN, Gleason, Otilio Carpen, PA-C .  fentaNYL (SUBLIMAZE) injection 50-200 mcg, 50-200 mcg, Intravenous, Q30 min PRN, Gleason, Otilio Carpen, PA-C .  hydrALAZINE (APRESOLINE) injection 10-20 mg, 10-20 mg, Intravenous, Q6H PRN, Dawley, Troy C, DO .  HYDROcodone-acetaminophen (NORCO/VICODIN) 5-325 MG per tablet 1 tablet, 1 tablet, Oral, Q4H PRN, Dawley, Troy C, DO .  insulin aspart (novoLOG) injection 0-20 Units, 0-20 Units, Subcutaneous, Q4H, Bergman, Meghan D, NP, 3 Units at 03/24/20 0459 .  labetalol (NORMODYNE) injection 10-40 mg, 10-40 mg, Intravenous, Q10 min PRN, Dawley, Troy C, DO, 10 mg at 03/23/20 0029 .  levETIRAcetam (KEPPRA) IVPB 500 mg/100 mL premix, 500 mg, Intravenous, Q12H, Dawley, Troy C, DO, Last Rate: 400 mL/hr at 03/24/20 0501, 500 mg at 03/24/20 0501 .  MEDLINE mouth rinse, 15 mL, Mouth Rinse, 10 times per day, Jacky Kindle, MD, 15 mL at 03/24/20 0539 .  metroNIDAZOLE (FLAGYL) IVPB 500 mg, 500 mg, Intravenous, Q8H, Carlyle Basques, MD, Last Rate: 100 mL/hr at 03/24/20 0000, Rate Verify at 03/24/20 0000 .  ondansetron (ZOFRAN) injection 4 mg, 4 mg, Intravenous, Q6H PRN, Dawley, Troy C, DO .  pantoprazole (PROTONIX) injection 40 mg, 40 mg, Intravenous, QHS, Dawley, Troy C, DO, 40 mg at 03/23/20 2218 .  polyethylene glycol (MIRALAX / GLYCOLAX) packet 17 g, 17 g, Oral, Daily PRN, Maudie Mercury, MD .  polyethylene glycol (MIRALAX / GLYCOLAX) packet 17 g, 17 g, Per Tube, Daily, Blenda Nicely, RPH, 17 g at 03/23/20 1155 .  promethazine (PHENERGAN) tablet 12.5-25 mg, 12.5-25 mg, Oral, Q4H PRN, Dawley, Troy C, DO .  propofol (DIPRIVAN) 1000 MG/100ML infusion, 0-50 mcg/kg/min, Intravenous, Continuous, Gleason, Otilio Carpen, PA-C, Last Rate: 13.2 mL/hr at 03/24/20 0512, 25 mcg/kg/min at 03/24/20 0512 .  sodium  chloride (hypertonic) 3 % solution, , Intravenous, Continuous, Bergman, Meghan D, NP, Last Rate: 75 mL/hr at 03/24/20 0506, New Bag at 03/24/20 0506 .  sodium chloride flush (NS) 0.9 % injection 10-40 mL, 10-40 mL, Intracatheter, Q12H, Agarwala, Ravi, MD, 20 mL at 03/23/20 2219 .  sodium chloride flush (NS) 0.9 % injection 10-40 mL, 10-40 mL, Intracatheter, PRN, Kipp Brood, MD .  vancomycin (VANCOCIN) IVPB 1000 mg/200 mL premix, 1,000 mg, Intravenous, Q8H, Carlyle Basques, MD, Last Rate: 200 mL/hr at 03/24/20 0111, 1,000 mg at 03/24/20 0111  Exam: Current vital signs: BP 113/61   Pulse 62   Temp 99.1 F (37.3 C) (Oral)   Resp 14   Wt 69.2 kg   SpO2 100%   BMI 23.89 kg/m  Vital signs in last 24 hours: Temp:  [98.8 F (37.1 C)-100.7 F (38.2 C)] 99.1 F (37.3 C) (08/29 0400) Pulse Rate:  [62-126] 62 (08/29 0500) Resp:  [14-124] 14 (08/29 0500) BP: (104-181)/(55-84) 113/61 (08/29 0500) SpO2:  [99 %-100 %] 100 % (08/29 0500) Arterial Line BP: (117-158)/(48-60) 129/56 (08/29 0500) FiO2 (%):  [40 %] 40 % (08/29 0419) General: Sedated on propofol, intubated HEENT: Normocephalic atraumatic, intubated with ET tube in  place CVS: Regular rate rhythm, no murmurs Respiratory: Vented Extremities warm well perfused Neurological exam Sedated intubated Sedation with propofol held for the exam No spontaneous movements noted According to RN, had some spontaneous cough when propofol is reduced. Positive for cough and gag on suctioning. Breathing over the vent. Cranial nerves: Pupils are pinpoint, sluggishly reactive, gaze is midline without any preference or deviation, no response in terms of blinking to threat from either side, facial symmetry difficult to ascertain due to the endotracheal tube. Motor exam: Upper extremity extensor posturing on noxious stimulation of the trapezius bilaterally.  Triple flexion on noxious stimulation to lower extremities bilaterally. Sensory exam: As  above Coordination cannot be tested NIH stroke scale 1a Level of Conscious.: 3 1b LOC Questions: 2 1c LOC Commands: 2 2 Best Gaze: 3 3 Visual: 3 4 Facial Palsy: 3 5a Motor Arm - left: 3 5b Motor Arm - Right: 3 6a Motor Leg - Left: 3 6b Motor Leg - Right: 3 7 Limb Ataxia: 0 8 Sensory: 2 9 Best Language: 3 10 Dysarthria: 2 11 Extinct. and Inatten.: 0 TOTAL: 32  Labs I have reviewed labs in epic and the results pertinent to this consultation are:   CBC    Component Value Date/Time   WBC 15.0 (H) 03/23/2020 0437   RBC 3.99 03/23/2020 0437   HGB 11.2 (L) 03/23/2020 0525   HCT 33.0 (L) 03/23/2020 0525   PLT 315 03/23/2020 0437   MCV 86.0 03/23/2020 0437   MCH 29.6 03/23/2020 0437   MCHC 34.4 03/23/2020 0437   RDW 14.2 03/23/2020 0437   LYMPHSABS 2.1 03/20/2020 1011   MONOABS 0.6 03/20/2020 1011   EOSABS 0.0 03/20/2020 1011   BASOSABS 0.0 03/20/2020 1011    CMP     Component Value Date/Time   NA 147 (H) 03/23/2020 2355   K 3.9 03/23/2020 0525   CL 106 03/23/2020 0437   CO2 20 (L) 03/23/2020 0437   GLUCOSE 206 (H) 03/23/2020 0437   BUN 6 03/23/2020 0437   CREATININE 0.47 03/23/2020 0437   CALCIUM 8.0 (L) 03/23/2020 0437   PROT 6.7 03/21/2020 0616   ALBUMIN 3.0 (L) 03/21/2020 0616   AST 9 (L) 03/21/2020 0616   ALT 12 03/21/2020 0616   ALKPHOS 93 03/21/2020 0616   BILITOT 0.7 03/21/2020 0616   GFRNONAA >60 03/23/2020 0437   GFRAA >60 03/23/2020 0437   Blood cultures negative but Gram stain showing gram-positive cocci.  Imaging I have reviewed the images obtained:  CT-scan of the brain-Done after biopsy 03/22/2020 -showed biopsy site with a rolled Gelfoam in the region of the previous mass in the left basal ganglia.  No evidence of hemorrhage.  Left to right shift of 11 mm Done later on the same date 03/22/2020 -Stable size and appearance of the left brain lesion with slightly diminished left to right midline shift of 9 mm previously 11 mm.  MRI examination of  the brain-official MRI reads 03/20/2020 MRI brain without contrast-6.5 x 4.4 x 4 cm tumor with the epicenter in the left basal ganglia and radiating white matter tracts with central necrosis.  Contrast-enhancement along the margins of the necrosis.  Enhancing region 6 x 2.4 x 3.5 cm.  There is mass-effect with flattening of the left lateral ventricle.  Left right midline shift of 6 mm.  No ventricular trapping.  No satellite lesion.  Findings consistent with glioma or GBM. 03/21/2020 MRI brain with and without contrast-new acute infarct within the right thalamus.  When  according for differences in technique/scan angle, similar size and appearance of a large centrally necrotic mass centered in the left basal ganglia with extensive T2/flair abnormality primarily concerning for high-grade mass.  Resulting effacement of left lateral ventricle and approximately 9 mm right midline shift, not substantially changed.  No progressive ventriculomegaly.  Similar right-sided ostiomeatal unit patent paranasal sinusitis.  MRI-formal read pending: MRI brain with without contrast-03/24/2020-in addition to the right thalamic stroke, there is a new large left PCA stroke involving the temporal lobe as well as new stroke in the left midbrain/pons which was not seen on the MRI scan of 03/22/2020.  Formal read pending. MRA head from 03/24/2020-no emergent LVO in the posterior circulation to explain the new strokes. No dural venous sinus thrombosis based on postcontrast imaging review-preliminarily discussed over the phone with neuroradiologist.  No obvious mycotic aneurysm seen.  Await formal reading and follow-up on the formal reading of the MRI and MRA from today.  Assessment: 54 year old admitted with strokelike features, with imaging findings initially concerning for a brain mass, underwent biopsy, which is more concerning for finding purulent material more consistent with a brain abscess, on treatment with  antibiotics-neurosurgery and ID following, had his change in mental status on the night of 03/22/2020/morning of 03/23/2020 prompting reintubation when she had been successfully extubated after the procedure. A follow-up MRI brain was completed of hours or so ago that reveals a new large left PCA stroke which was not seen on the MRI scan of 03/22/2020 (8/27 scan showed a small rt thalamic stroke which was acute) Given that she has brain abscess, with a possible source from an untreated dental abscess, this left PCA stroke as well as a right posterior circ stroke (thalamic) and possibly right Basal ganglia stroke as well, points to an embolic etiology and in an infected patient, should be concerning for septic emboli/infective endocarditis. Needs further stroke work-up-might need TEE as well. Midline shift unchanged from the prior scan. Basilar patent.  No dural venous sinus thrombosis.  Impression: Acute ischemic stroke involving the left PCA territory (and a right thalamic stroke seen 8/27) in the setting of brain abscess from dental infection-could indicate septic emboli. Evaluate for endocarditis  Recommendations: -Frequent neurochecks -Telemetry -2D echo-Will need TEE likely -Check A1c and lipid panel. -carotid dopplerds -Continue antibiotics -No antiplatelets or anticoagulants -Blood pressure goal-allow modified permissive hypertension-try to avoid pressures below 120 and try to maintain goal pressures between 120-160 given the recent surgery and the friable brain tissue from the abscess. -Continue hypertonic saline -Blood cultures negative thus far but Gram stain with gram-positive cocci -ID on board-appreciate assistance-further management of infectious process per ID/PCCM. -Per the patient's RN, neurosurgery has been notified and do not have any further recommendations from a surgical standpoint at this time. -I would wait for morning rounds and have the rounding stroke team as well as  neurosurgery discussed the timing of next imaging-obtain stat imaging if there is any neuro change but I would also get a CT head in 6 to 12 hours given the large size of the left PCA stroke to look for any acute changes.  Stroke team will follow with you.  Plan discussed with the bedside RN as well as Dr. Lucile Shutters on the phone.  -- Amie Portland, MD [facility] Pager: [phone] If 7pm to 7am, please call on call as listed on [facility].  CRITICAL CARE ATTESTATION Performed by: Amie Portland, MD Total critical care time: 59 minutes Critical care time was exclusive of separately  billable procedures and treating other patients and/or supervising APPs/Residents/Students Critical care was necessary to treat or prevent imminent or life-threatening deterioration due to acute ischemic stroke, brain abscess This patient is critically ill and at significant risk for neurological worsening and/or death and care requires constant monitoring. Critical care was time spent personally by me on the following activities: development of treatment plan with patient and/or surrogate as well as nursing, discussions with consultants, evaluation of patient's response to treatment, examination of patient, obtaining history from patient or surrogate, ordering and performing treatments and interventions, ordering and review of laboratory studies, ordering and review of radiographic studies, pulse oximetry, re-evaluation of patient's condition, participation in multidisciplinary rounds and medical decision making of high complexity in the care of this patient.

## 2020-03-24 NOTE — Progress Notes (Signed)
RT note.  Pt. Transported to MRI back to room. Pt. Did desat to low 60. RT suction and pulse ox probe moved. And pt. Sat came back up to 100. Pt. Returned to room and resting comfortable. RT will continue to monitor.

## 2020-03-24 NOTE — Progress Notes (Signed)
STROKE TEAM PROGRESS NOTE   HISTORY OF PRESENT ILLNESS (per record) Vickie Alvarado is a 54 y.o. female past medical history of anxiety, bipolar disorder, extensive periodontal disease, hypertension, hyperlipidemia, awaiting to undergo a dental procedure on 03/01/2020 but was found to be febrile at the time of dental visit but could not get the procedure done due to being tested positive for COVID-19, given instructions to self isolate for 14 days brought to the ED on 03/20/2020 because the family that checked on her thought she might be having a stroke given her altered mental status and significant right-sided weakness and right facial droop along with dysarthria. Brain imaging in the emergency room showed a 6.5 x 4.5 x 4 cm mass in the left basal ganglia-initial differential tumor versus abscess.  Neurosurgery consulted for biopsy, preliminary pathology concerning for purulent material consistent with abscess. She was extubated after the stereotactic biopsy on 03/22/2020 but had to be reintubated due to change in mental status later that night, in the early morning hours of 03/23/2020.  CT head did not show any concerning changes but due to the change in her mentation, MRI brain was ordered, which was just completed a little while ago that showed a large left PCA infarct along with scattered infarcts in the right basal ganglia and right thalamus. Neurological consultation obtained upon discovery of these new findings on imaging. Patient is currently being followed by Dr. Ilsa Iha from infectious diseases.  She is on antibiotics-vancomycin, ceftriaxone and Flagyl.  Also running on 3% saline. Repeat MRI with stable shift, no LVO on MRA. LKW: Unclear-prior to presentation on 03/20/2020 but then change again in mentation requiring intubation on 827 on 03/23/2020. tpa given?: no, outside the window Premorbid modified Rankin scale (mRS): Unable to ascertain   INTERVAL HISTORY Her nurses at bedside her exam is  stable there have been no changes, she continues to have cranial nerve reflexes but posturing and triple flex on exam.  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    OBJECTIVE Vitals:   03/24/20 0823 03/24/20 0830 03/24/20 0845 03/24/20 0900  BP:    (!) 111/58  Pulse:  81 83 80  Resp:  16 17 18   Temp:      TempSrc:      SpO2: 100% 100% 100% 100%  Weight:        CBC:  Recent Labs  Lab 03/20/20 1011 03/21/20 0616 03/23/20 0437 03/23/20 0437 03/23/20 0525 03/24/20 0534  WBC 9.5   < > 15.0*  --   --  12.2*  NEUTROABS 6.7  --   --   --   --  9.9*  HGB 12.5   < > 11.8*   < > 11.2* 10.3*  HCT 37.6   < > 34.3*   < > 33.0* 31.5*  MCV 88.1   < > 86.0  --   --  90.0  PLT 360   < > 315  --   --  247   < > = values in this interval not displayed.    Basic Metabolic Panel:  Recent Labs  Lab 03/23/20 0437 03/23/20 0437 03/23/20 0525 03/23/20 1048 03/23/20 1732 03/23/20 1732 03/23/20 2355 03/24/20 0534  NA 137   < > 139   < > 144   < > 147* 147*  K 4.1   < > 3.9  --   --   --   --  2.9*  CL 106  --   --   --   --   --   --  118*  CO2 20*  --   --   --   --   --   --  21*  GLUCOSE 206*  --   --   --   --   --   --  182*  BUN 6  --   --   --   --   --   --  13  CREATININE 0.47  --   --   --   --   --   --  0.43*  CALCIUM 8.0*  --   --   --   --   --   --  8.0*  MG  --   --   --    < > 2.1  --   --  2.0  PHOS  --   --   --    < > 2.3*  --   --  2.3*   < > = values in this interval not displayed.    Lipid Panel:     Component Value Date/Time   TRIG 95 03/24/2020 0534   HgbA1c:  Lab Results  Component Value Date   HGBA1C 11.5 (H) 03/22/2020   Urine Drug Screen:     Component Value Date/Time   LABOPIA NONE DETECTED 03/20/2020 1630   COCAINSCRNUR NONE DETECTED 03/20/2020 1630   LABBENZ NONE DETECTED 03/20/2020 1630   AMPHETMU NONE DETECTED  03/20/2020 1630   THCU POSITIVE (A) 03/20/2020 1630   LABBARB NONE DETECTED 03/20/2020 1630    Alcohol Level     Component Value Date/Time   ETH <10 03/20/2020 1011    IMAGING  CT HEAD WO CONTRAST 03/22/2020 IMPRESSION:  1. Stable size and appearance of the left brain lesion centered in the basal ganglia with internal air or Gel-Foam. Stable regional mass effect. Slightly diminished left-to-right midline shift of 9 mm, previously 11 mm.  2. No evidence of hemorrhage or new abnormality.   CT HEAD WO CONTRAST 03/22/2020 IMPRESSION:  Biopsy site with air or Gel-Foam in the region of the previous tumor with the epicenter in the left basal ganglia. No evidence of postsurgical hemorrhage. No change in regional mass effect with left-to-right shift 11 mm.   MR ANGIO HEAD WO CONTRAST 03/24/2020  MRI HEAD  IMPRESSION:  1. Postoperative changes from stereotactic aspiration/drainage of left basal ganglia lesion without complication. Lesion is decreased in size now measuring 5.1 x 2.4 x 2.7 cm. Per history, this is consistent with and intracerebral abscess.  2. Persistent associated vasogenic edema throughout the left cerebral hemisphere with mildly worsened 10 mm left-to-right shift.  3. Interval development of multifocal predominantly posterior circulation infarcts, including a large left PCA territory infarct. Minimal petechial hemorrhage at the left pons without hemorrhagic transformation. A central thromboembolic source is suspected.  4. Patchy leptomeningeal enhancement within the posterior left occipital region. While this may be reactive to the adjacent left PCA territory infarct, a degree of leptomeningeal meningitis related to intracerebral infection could also be considered.  5. Extensive right-sided paranasal sinus disease.   MRA HEAD  IMPRESSION:  1. Negative intracranial MRA for large vessel occlusion.  2. Scattered vascular irregularity involving the basilar and bilateral PCAs  without occlusion. No proximal high-grade or correctable stenosis. No aneurysm.   MR BRAIN W WO CONTRAST 03/22/2020 IMPRESSION:  1. New/acute infarct within the right thalamus.  2. 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 Resulting effacement of the left lateral ventricle and approximately 9 mm of right midline shift, not substantially changed. No progressive ventriculomegaly.  3. Similar right-sided ostiomeatal unit pattern paranasal sinusitis.   Portable Chest x-ray 03/23/2020 IMPRESSION:  1. Endotracheal tube with tip at the level of the carina tilting towards the right mainstem bronchus. Recommend retraction by 4 cm for optimal positioning.  2. Bilateral streaky and hazy airspace densities may represent atelectasis or pneumonia.   Transthoracic Echocardiogram  00/00/2021 Pending  Bilateral Carotid Dopplers  00/00/2021 Pending  ECG - SR rate 70 BPM. (See cardiology reading for complete details)   PHYSICAL EXAM Blood pressure (!) 111/58, pulse 80, temperature 100.1 F (37.8 C), temperature source Axillary, resp. rate 18, weight 69.2 kg, SpO2 100 %.   This is a middle-aged woman intubated not sedated.  She does not open eyes to sternal rub.  She has a left gaze preference.  No spontaneous movements noted.  Intact corneals, gag, cough, oculocephalic reflexes.  Pupils are pinpoint and sluggish, no blinking to threat in any quadrant, facial symmetry difficult to assess due to the endotracheal tube, upper extremity extensor posturing and lower extremity triple flexion on noxious stimuli.   ASSESSMENT/PLAN Vickie Alvarado is a 54 y.o. female with history of anxiety, bipolar disorder, tobacco use, extensive periodontal disease (was scheduled for dentist 8/6 but cancelled 2/2  fever and Covid), hypertension, hyperlipidemia,  Covid positive (told to self isolate for 14 days) brought to the ED on 03/20/2020 with altered mental status, significant right-sided weakness, right facial droop along and dysarthria. Brain imaging in ED showed a mass in left basal ganglia - tumor versus abscess.  Neurosurgery - biopsy 8/27 -> consistent with abscess. AMS 8/28 -> MRI - large left PCA infarct along with scattered infarcts in the right basal ganglia and right thalamus  She did not receive IV t-PA due to being outside of therapeutic window.  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 Stable regional mass effect. Slightly diminished left-to-right midline shift of 9 mm.  MRI head - 8/27 - New/acute infarct within the right thalamus. Large centrally necrotic mass centered in the left basal ganglia. 9 mm of right midline shift.  MRI head 8/29 - 10 mm left-to-right shift. Interval development of multifocal predominantly posterior circulation infarcts, including a large left PCA territory infarct. Patchy leptomeningeal enhancement within the posterior left occipital region. Leptomeningeal meningitis related to intracerebral infection could be considered.  MRA head - Scattered vascular irregularity involving the basilar and bilateral PCAs without occlusion. No proximal high-grade or correctable stenosis. No aneurysm.   CTA H&N - not ordered  CT Perfusion - not ordered  Carotid Doppler - will order  2D Echo - will order  [employer] Corona [employer] 2 - positive on 03/01/20 - negative on 03/14/20  LDL - pending  HgbA1c - 11.5  UDS - THCU  VTE prophylaxis - SCDs Diet  Diet Order            Diet NPO time specified  Diet effective now  No antithrombotic prior to admission, now on No antithrombotic  Patient will be counseled to be compliant with her antithrombotic medications  Ongoing aggressive stroke risk factor management  Therapy recommendations:  pending  Disposition:  Pending  Hypertension  Home BP meds: none   Current BP meds: prn Cleviprex ; hydralazine ; labetalol  Stable . SBP goal 120-160 mmHg (given the recent surgery and the friable brain tissue from the abscess) per Dr Wilford Corner . Long-term BP goal normotensive  Hyperlipidemia  Home Lipid lowering medication: none   LDL - pending, goal < 70  Current lipid lowering medication: none / NPO   Continue statin at discharge  Diabetes  Home diabetic meds: none   Current diabetic meds: SSI   HgbA1c 11.5,  goal < 7.0 Recent Labs    03/23/20 2313 03/24/20 0430 03/24/20 0811  GLUCAP 174* 138* 177*    Brain Abscess   Biopsy 8/27 - cultures - pending  Vancomycin started 8/28  Rocephin and metronidazole started 8/27  Keppra started 8/25  Decadron started 8/28  ID following  Cerebral Edema   Hypertonic saline 75 cc / hr  Na - 139->144->147->147  10 mm left-to-right shift  Other Stroke Risk Factors  Cigarette smoker will be advised to stop smoking  Substance abuse - UDS - THCU  Other Active Problems  Code status - Full code  NPO - tube feedings  Leukocytosis - (on Decadron) - 9.5->15.0->12.2  Fever (multiple abxs as noted above) - 100.1  Anemia - Hgb - 12.5->11.8->11.2->10.3  Hypokalemia - 2.9 - supplement   Hypophosphatemia - 2.3  Intubated   PLAN  CT head in 6 to 12 hours given the large size of the left PCA stroke to look for any acute changes - per Dr Wilford Corner   SBP goal 120-160 mmHg (given the recent surgery and the friable brain tissue from the abscess) per Dr Alegent [facility] day # 4  I discussed with CCM today, neurology/stroke will sign off and we will be consulted in the future if  needed. Personally examined patient and images, and have participated in and made any corrections needed to history, physical, neuro exam,assessment and plan as stated above.  I have personally obtained the history, evaluated lab date, reviewed imaging studies and agree with radiology interpretations.    Naomie Dean, MD Stroke Neurology  I spent 40 minutes of face-to-face and non-face-to-face time with patient. This included prechart review, lab review, study review, order entry, electronic health record documentation, patient education on the different diagnostic and therapeutic options, counseling and coordination of care, risks and benefits of management, compliance, or risk factor reduction. This patient is critically ill and at significant risk of neurological worsening, death and care requires constant monitoring of vital signs, hemodynamics,respiratory and cardiac monitoring,review of multiple databases, neurological assessment, discussion with family, other specialists and medical decision making of high complexity.I  To contact Stroke Continuity provider, please refer to [email or web address]. After hours, contact General Neurology

## 2020-03-25 LAB — COMPREHENSIVE METABOLIC PANEL
ALT: 20 U/L (ref 0–44)
AST: 15 U/L (ref 15–41)
Albumin: 2.3 g/dL — ABNORMAL LOW (ref 3.5–5.0)
Alkaline Phosphatase: 60 U/L (ref 38–126)
Anion gap: 8 (ref 5–15)
BUN: 19 mg/dL (ref 6–20)
CO2: 21 mmol/L — ABNORMAL LOW (ref 22–32)
Calcium: 8.2 mg/dL — ABNORMAL LOW (ref 8.9–10.3)
Chloride: 125 mmol/L — ABNORMAL HIGH (ref 98–111)
Creatinine, Ser: 0.49 mg/dL (ref 0.44–1.00)
GFR calc Af Amer: >60 mL/min (ref >60)
GFR calc non Af Amer: >60 mL/min (ref >60)
Glucose, Bld: 207 mg/dL — ABNORMAL HIGH (ref 70–99)
Potassium: 3.3 mmol/L — ABNORMAL LOW (ref 3.5–5.1)
Sodium: 154 mmol/L — ABNORMAL HIGH (ref 135–145)
Total Bilirubin: 0.4 mg/dL (ref 0.3–1.2)
Total Protein: 4.8 g/dL — ABNORMAL LOW (ref 6.5–8.1)

## 2020-03-25 LAB — TRIGLYCERIDES: Triglycerides: 88 mg/dL (ref <150)

## 2020-03-25 LAB — CBC WITH DIFFERENTIAL/PLATELET
Abs Immature Granulocytes: 0.08 10*3/uL — ABNORMAL HIGH (ref 0.00–0.07)
Basophils Absolute: 0 10*3/uL (ref 0.0–0.1)
Basophils Relative: 0 %
Eosinophils Absolute: 0 10*3/uL (ref 0.0–0.5)
Eosinophils Relative: 0 %
HCT: 30.1 % — ABNORMAL LOW (ref 36.0–46.0)
Hemoglobin: 9.7 g/dL — ABNORMAL LOW (ref 12.0–15.0)
Immature Granulocytes: 1 %
Lymphocytes Relative: 13 %
Lymphs Abs: 1.3 10*3/uL (ref 0.7–4.0)
MCH: 29.7 pg (ref 26.0–34.0)
MCHC: 32.2 g/dL (ref 30.0–36.0)
MCV: 92 fL (ref 80.0–100.0)
Monocytes Absolute: 0.8 10*3/uL (ref 0.1–1.0)
Monocytes Relative: 8 %
Neutro Abs: 7.7 10*3/uL (ref 1.7–7.7)
Neutrophils Relative %: 78 %
Platelets: 228 10*3/uL (ref 150–400)
RBC: 3.27 MIL/uL — ABNORMAL LOW (ref 3.87–5.11)
RDW: 15.6 % — ABNORMAL HIGH (ref 11.5–15.5)
WBC: 9.9 10*3/uL (ref 4.0–10.5)
nRBC: 0 % (ref 0.0–0.2)

## 2020-03-25 LAB — LIPID PANEL
Cholesterol: 116 mg/dL (ref 0–200)
HDL: 20 mg/dL — ABNORMAL LOW (ref >40)
LDL Cholesterol: 79 mg/dL (ref 0–99)
Total CHOL/HDL Ratio: 5.8 RATIO
Triglycerides: 85 mg/dL (ref <150)
VLDL: 17 mg/dL (ref 0–40)

## 2020-03-25 LAB — SODIUM
Sodium: 154 mmol/L — ABNORMAL HIGH (ref 135–145)
Sodium: 157 mmol/L — ABNORMAL HIGH (ref 135–145)
Sodium: 157 mmol/L — ABNORMAL HIGH (ref 135–145)

## 2020-03-25 LAB — GLUCOSE, CAPILLARY
Glucose-Capillary: 221 mg/dL — ABNORMAL HIGH (ref 70–99)
Glucose-Capillary: 200 mg/dL — ABNORMAL HIGH (ref 70–99)
Glucose-Capillary: 191 mg/dL — ABNORMAL HIGH (ref 70–99)
Glucose-Capillary: 169 mg/dL — ABNORMAL HIGH (ref 70–99)
Glucose-Capillary: 207 mg/dL — ABNORMAL HIGH (ref 70–99)

## 2020-03-25 LAB — SURGICAL PATHOLOGY

## 2020-03-25 MED ORDER — HEPARIN SODIUM (PORCINE) 5000 UNIT/ML IJ SOLN
5000.0000 [IU] | Freq: Three times a day (TID) | INTRAMUSCULAR | Status: AC
  Administered 2020-03-25 – 2020-04-03 (×25): 5000 [IU] via SUBCUTANEOUS
  Filled 2020-03-25 (×26): qty 1

## 2020-03-25 NOTE — Progress Notes (Signed)
SLP Cancellation Note  Patient Details Name: Vickie Alvarado MRN: [id number] DOB: 11/09/1965   Cancelled treatment:       Reason Eval/Treat Not Completed: Medical issues which prohibited therapy. Pt remains intubated. Per chart, posturing noted on exam. Pt not a candidate for neurosurgery intervention. SLP to sign off. Please reorder if needed.    Mahala Menghini., M.A. CCC-SLP Acute Rehabilitation Services Pager [phone] Office [phone]  03/25/2020, 7:18 AM

## 2020-03-25 NOTE — Progress Notes (Signed)
NAME:  Vickie Alvarado, MRN:  [id number], DOB:  August 05, 1965, LOS: 5 ADMISSION DATE:  03/20/2020, CONSULTATION DATE:  03/22/20 REFERRING MD:  Dr. 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 S/p L frontal stereotactic biopsy/aspiration of the abscess and PCCM consulted to admit.  History of present illness   Patient is a 54 y/o F with a history of HTN, HLD, COVID-19 infection (03/01/20), dental infections, and sinus infections presented to [facility], after being found altered, with R sided weakness, dysarthria, and R facial droop. Patient had decreased  P.O. intake, decreased appetite, and projectile vomiting in addition to her other symptoms. MRI brain showed a 6.5 x 4.5 x 4 cm mass in the Left basal ganglia. Neurosurgery performed a L frontal stereotactic biopsy/aspiration of the abscess and PCCM consulted for admission to neurosurgical ICU.   Past Medical History  Anxiety  Bipolar Disorder Dental infection HTN HLD  Significant Hospital Events   8/25> Admitted 8/27> Aspiration of brain abscess, admitted ICU   Consults:  PCCM  Neurosurgery   Procedures:  8/27> L frontal stereotactic biopsy/aspiration of L basal ganglia mass   Significant Diagnostic Tests:  8/25 MR Brain> 6.5x4.5x4 cm mass in the epicenter of the L basal ganglia and radiating white matter tracts 8/29 MR Brain, angio head> post op changes from drainage of L basal ganglia without complication. Lesion now 5.1x2.4x2.7cm. Persistent vasogenic edema throughout L cerebral hemisphere with mildly worse L to R shift, now 28mm. Interval multifocal posterior circulation infarcts. MRA without large vessel occlusion. Scattered vascular irregularity involving basilar and bilateral PCAs without occlusion.  Micro Data:   8/26 BC x2> NGTD 8/27 Gram Stain> sent 8/27 Aerobic/Anaerobe> strep intermedius, pan sensitive  8/27 Fungus Culture with stain> Sent  8/27 Urine Culture<  <10k colonies    Antimicrobials:  8/27 Cefepime>> 8/27 8/27 Vanc>> 8/27 Metro>> 8/27 Ceftriaxone>> 8/27 Cefazolin>>8/27  Interim history/subjective:  Continues on 3% Continues on low dose propofol  No decline in neuro exam from prior   Objective   Blood pressure (!) 146/75, pulse (!) 55, temperature 98 F (36.7 C), temperature source Axillary, resp. rate 18, weight 69.6 kg, SpO2 100 %.    Vent Mode: PRVC FiO2 (%):  [40 %] 40 % Set Rate:  [12 bmp] 12 bmp Vt Set:  [490 mL] 490 mL PEEP:  [5 cmH20] 5 cmH20 Plateau Pressure:  [11 cmH20-17 cmH20] 15 cmH20   Intake/Output Summary (Last 24 hours) at 03/25/2020 1106 Last data filed at 03/25/2020 0800 Gross per 24 hour  Intake 3332.47 ml  Output 1695 ml  Net 1637.47 ml   Filed Weights   03/22/20 1200 03/23/20 0400 03/25/20 0500  Weight: 88 kg 69.2 kg 69.6 kg   Examination: Physical Exam General: Critically ill appearing middle aged F, sedated, on vent, NAD  HEENT: Sutures in place without erythema or drainage. ETT OGT secure trachea midline Neuro: Sedated. No response to painful stimuli on L. Localizes to pain on R.  CV: RRR s1s2 no rgm. Cap refill < 3 seconds  Pulm: CTA bilaterally. Symmetrical chest expansion, mechanically ventilated GI: soft round ndnt + bowel sounds x 4  GU: foley  MSK: No obvious joint deformity. No cyanosis or clubbing.  Skin: c/d/w without rash  Resolved Hospital  Problem list     Assessment & Plan:      Cerebral abscess s/p drainage, requiring initiation of hypertonic saline to limit cerebral edema -strep intermedius from tissue path  New PCA infarct secondary to inflammation/mass effect from abscess.  Acute toxic metabolic encephalopathy Bipolar Disorder Plan: -Continue propofol (minimize ICP spikes, minimize coughing) -Continue 3%  saline for Na goal 150-155  -Continue rocephin  -No stroke intervention. Neurosurgery continues to follow  -decadron -Keppra  -Will order CT H 8/31, hopefully dc 3% following CT   Acute hypoxic respiratory failure requiring mechanical ventilation -Continue full MV support -VAP, pulm hygiene (may need intermittently increased sedation during pulm hygiene to prevent ICP spikes)  -Discussed with family at bedside with PCCM physician-- if no further decline, may be reasonable to consider trach/peg   Hypertension Dyslipidemia -Cleviprex for SBP < 150 -ICU monitoring   Hyperglycemia -SSI  Hypokalemia, mild -replace, continue to trend BMP   Best practice:  Diet: EN Pain/Anxiety/Delirium protocol (if indicated): Propofol, PRN fentanyl  DVT prophylaxis: Subq Heparin Glucose control: SSI  Mobility: Bed rest  Code Status: FULL  Family Communication: daughter updated at bedside. Appreciative of ongoing care and transparent GOC discussions Disposition: ICU  Labs   CBC: Recent Labs  Lab 03/20/20 1011 03/20/20 1011 03/21/20 0616 03/21/20 0616 03/22/20 0411 03/22/20 1512 03/23/20 0131 03/23/20 0437 03/23/20 0525 03/24/20 0534 03/25/20 0525  WBC 9.5   < > 12.0*  --  13.9*  --   --  15.0*  --  12.2* 9.9  NEUTROABS 6.7  --   --   --   --   --   --   --   --  9.9* 7.7  HGB 12.5   < > 12.7   < > 13.3   < > 12.9 11.8* 11.2* 10.3* 9.7*  HCT 37.6   < > 37.4   < > 39.0   < > 38.0 34.3* 33.0* 31.5* 30.1*  MCV 88.1   < > 86.6  --  85.9  --   --  86.0  --  90.0 92.0  PLT 360   < > 338  --  363  --   --  315  --  247 228   < > = values in this interval not displayed.    Basic Metabolic Panel: Recent Labs  Lab 03/21/20 0616 03/21/20 0616 03/22/20 0411 03/22/20 1512 03/23/20 0131 03/23/20 0131 03/23/20 0437 03/23/20 0437 03/23/20 0525 03/23/20 0525 03/23/20 1048 03/23/20 1048 03/23/20 1732 03/23/20 2355 03/24/20 0534 03/24/20 1130 03/24/20 1727 03/24/20 2351  03/25/20 0525  NA 135   < > 134*   < > 135   < > 137   < > 139   < > 138   < > 144   < > 147* 150* 151* 154* 154*  K 3.7   < > 3.6   < > 2.5*  --  4.1  --  3.9  --   --   --   --   --  2.9*  --   --   --  3.3*  CL 101  --  103  --   --   --  106  --   --   --   --   --   --   --  118*  --   --   --  125*  CO2 20*  --  17*  --   --   --  20*  --   --   --   --   --   --   --  21*  --   --   --  21*  GLUCOSE 162*  --  173*  --   --   --  206*  --   --   --   --   --   --   --  182*  --   --   --  207*  BUN 11  --  11  --   --   --  6  --   --   --   --   --   --   --  13  --   --   --  19  CREATININE 0.56  --  0.44  --   --   --  0.47  --   --   --   --   --   --   --  0.43*  --   --   --  0.49  CALCIUM 9.0  --  8.9  --   --   --  8.0*  --   --   --   --   --   --   --  8.0*  --   --   --  8.2*  MG  --   --  1.9  --   --   --   --   --   --   --  1.8  --  2.1  --  2.0  --  2.2  --   --   PHOS  --   --   --   --   --   --   --   --   --   --  2.0*  --  2.3*  --  2.3*  --  1.7*  --   --    < > = values in this interval not displayed.   GFR: Estimated Creatinine Clearance: 77.3 mL/min (by C-G formula based on SCr of 0.49 mg/dL). Recent Labs  Lab 03/22/20 0411 03/23/20 0437 03/24/20 0534 03/25/20 0525  WBC 13.9* 15.0* 12.2* 9.9    Liver Function Tests: Recent Labs  Lab 03/20/20 1011 03/21/20 0616 03/24/20 0534 03/25/20 0525  AST 11* 9* 11* 15  ALT 14 12 12 20   ALKPHOS 103 93 60 60  BILITOT 0.6 0.7 0.5 0.4  PROT 7.2 6.7 5.1* 4.8*  ALBUMIN 3.2* 3.0* 2.3* 2.3*   No results for input(s): LIPASE, AMYLASE in the last 168 hours. No results for input(s): AMMONIA in the last 168 hours.  ABG    Component Value Date/Time   PHART 7.458 (H) 03/23/2020 0525   PCO2ART 29.1 (L) 03/23/2020 0525   PO2ART 106 03/23/2020 0525   HCO3 20.6 03/23/2020 0525   TCO2 21 (L) 03/23/2020 0525   ACIDBASEDEF 2.0 03/23/2020 0525   O2SAT 98.0 03/23/2020 0525     Coagulation Profile: Recent Labs  Lab  03/20/20 1011  INR 1.2    Cardiac Enzymes: No results for input(s): CKTOTAL, CKMB, CKMBINDEX, TROPONINI in the last 168 hours.  HbA1C: Hgb A1c MFr Bld  Date/Time Value Ref Range Status  03/22/2020 04:55 PM 11.5 (H) 4.8 - 5.6 % Final    Comment:    (NOTE) Pre diabetes:          5.7%-6.4%  Diabetes:              >6.4%  Glycemic control for   <7.0% adults with diabetes     CBG: Recent Labs  Lab 03/24/20 1554 03/24/20 1956 03/24/20  2343 03/25/20 0338 03/25/20 0755  GLUCAP 209* 136* 203* 221* 200*   CRITICAL CARE Performed by: Lanier Clam   Total critical care time: 42  minutes  Critical care time was exclusive of separately billable procedures and treating other patients. Critical care was necessary to treat or prevent imminent or life-threatening deterioration.  Critical care was time spent personally by me on the following activities: development of treatment plan with patient and/or surrogate as well as nursing, discussions with consultants, evaluation of patient's response to treatment, examination of patient, obtaining history from patient or surrogate, ordering and performing treatments and interventions, ordering and review of laboratory studies, ordering and review of radiographic studies, pulse oximetry and re-evaluation of patient's condition.  Tessie Fass MSN, AGACNP-BC [facility] [phone] If no answer, [phone] 03/25/2020, 11:45 AM

## 2020-03-25 NOTE — Progress Notes (Signed)
eLink Physician-Brief Progress Note Patient Name: Vickie Alvarado DOB: 1966/05/18 MRN: [id number]   Date of Service  03/25/2020  HPI/Events of Note  Na 154. On 3% saline for goal Na ~150 for ICP management.   eICU Interventions  Reduce 3% saline infusion from 50cc/hr to 30cc/hr given that we are at the upper end of the desired range.  Repeat Na level in 4 hours.     Intervention Category Intermediate Interventions: Electrolyte abnormality - evaluation and management  Marveen Reeks Perina Salvaggio 03/25/2020, 3:50 AM

## 2020-03-25 NOTE — Progress Notes (Signed)
E-Link notified of sodium of 154.

## 2020-03-25 NOTE — Progress Notes (Signed)
   Providing Compassionate, Quality Care - Together  NEUROSURGERY PROGRESS NOTE   S: No issues overnight. Neuro status remains poor.  O: EXAM:  BP (!) 146/72   Pulse (!) 57   Temp 98 F (36.7 C) (Axillary)   Resp 18   Wt 69.6 kg   SpO2 100%   BMI 24.03 kg/m   Intubated, sedated on propofol Eyes closed to pain PERRL (46mm to 67mm) Min extensor posturing BL + corneal reflex BL + cough/gag Incision c/d/i  ASSESSMENT:  54 y.o. female with  1. Left frontal abscess with mass effect 2. 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 Intermedius - HTS 3%, Na goal 145-155 - keppra - decadron - continue supportive care - dvt ppx w SQH - guarded prognosis, I discussed at length with her daughter at bedside about her neurologic decline due to her significant cerebritis and abscess.  She now has had multiple strokes as well, right thalamic (noted on preop scan) and left PCA with the latter likely being due to mass-effect.  I discussed that there is no further neurosurgical intervention that we can offer at this time.  We will continue maximal medical management.  I did recommend she have her local family come visit and hopefully she will show some signs of improvement over the next 72 hours.  I answered all of her questions and spent approximately 20 minutes discussing her mom's current status.   Thank you for allowing me to participate in this patient's care.  Please do not hesitate to call with questions or concerns.   Monia Pouch, DO Neurosurgeon [facility] Cell: [phone]

## 2020-03-25 NOTE — Progress Notes (Addendum)
Inpatient Diabetes Program Recommendations  AACE/ADA: New Consensus Statement on Inpatient Glycemic Control (2015)  Target Ranges:  Prepandial:   less than 140 mg/dL      Peak postprandial:   less than 180 mg/dL (1-2 hours)      Critically ill patients:  140 - 180 mg/dL   Lab Results  Component Value Date   GLUCAP 200 (H) 03/25/2020   HGBA1C 11.5 (H) 03/22/2020    Review of Glycemic Control Results for Vickie Alvarado, Vickie Alvarado (MRN [id number]) as of 03/25/2020 09:16  Ref. Range 03/24/2020 08:11 03/24/2020 11:33 03/24/2020 15:54 03/24/2020 19:56 03/24/2020 23:43 03/25/2020 03:38 03/25/2020 07:55  Glucose-Capillary Latest Ref Range: 70 - 99 mg/dL 993 (H) 570 (H) 177 (H) 136 (H) 203 (H) 221 (H) 200 (H)   Diabetes history: None  Current orders for Inpatient glycemic control:  Lantus 10 units Daily Novolog 0-20 units Q4 hours  Decadron 10 mg Q6 hours Vital HP 45 ml/hour A1c 11.5% on 8/27 after 4 doses of decadron  Inpatient Diabetes Program Recommendations:    -  Increase Lantus to 15 units -  Add Novolog Tube Feed Coverage 5 units Q4 hours  Thanks,  Christena Deem RN, MSN, BC-ADM Inpatient Diabetes Coordinator Team Pager [phone] (8a-5p)

## 2020-03-26 ENCOUNTER — Inpatient Hospital Stay (HOSPITAL_COMMUNITY): Payer: Medicare HMO

## 2020-03-26 LAB — TRIGLYCERIDES: Triglycerides: 658 mg/dL — ABNORMAL HIGH (ref <150)

## 2020-03-26 LAB — CBC WITH DIFFERENTIAL/PLATELET
Abs Immature Granulocytes: 0.07 10*3/uL (ref 0.00–0.07)
Basophils Absolute: 0 10*3/uL (ref 0.0–0.1)
Basophils Relative: 0 %
Eosinophils Absolute: 0 10*3/uL (ref 0.0–0.5)
Eosinophils Relative: 0 %
HCT: 29.8 % — ABNORMAL LOW (ref 36.0–46.0)
Hemoglobin: 9.5 g/dL — ABNORMAL LOW (ref 12.0–15.0)
Immature Granulocytes: 1 %
Lymphocytes Relative: 21 %
Lymphs Abs: 1.7 10*3/uL (ref 0.7–4.0)
MCH: 30.1 pg (ref 26.0–34.0)
MCHC: 31.9 g/dL (ref 30.0–36.0)
MCV: 94.3 fL (ref 80.0–100.0)
Monocytes Absolute: 0.5 10*3/uL (ref 0.1–1.0)
Monocytes Relative: 7 %
Neutro Abs: 5.9 10*3/uL (ref 1.7–7.7)
Neutrophils Relative %: 71 %
Platelets: 197 10*3/uL (ref 150–400)
RBC: 3.16 MIL/uL — ABNORMAL LOW (ref 3.87–5.11)
RDW: 15.7 % — ABNORMAL HIGH (ref 11.5–15.5)
WBC: 8.2 10*3/uL (ref 4.0–10.5)
nRBC: 0 % (ref 0.0–0.2)

## 2020-03-26 LAB — GLUCOSE, CAPILLARY
Glucose-Capillary: 228 mg/dL — ABNORMAL HIGH (ref 70–99)
Glucose-Capillary: 219 mg/dL — ABNORMAL HIGH (ref 70–99)
Glucose-Capillary: 215 mg/dL — ABNORMAL HIGH (ref 70–99)
Glucose-Capillary: 239 mg/dL — ABNORMAL HIGH (ref 70–99)
Glucose-Capillary: 208 mg/dL — ABNORMAL HIGH (ref 70–99)
Glucose-Capillary: 197 mg/dL — ABNORMAL HIGH (ref 70–99)
Glucose-Capillary: 194 mg/dL — ABNORMAL HIGH (ref 70–99)

## 2020-03-26 LAB — SODIUM
Sodium: 155 mmol/L — ABNORMAL HIGH (ref 135–145)
Sodium: 156 mmol/L — ABNORMAL HIGH (ref 135–145)
Sodium: 155 mmol/L — ABNORMAL HIGH (ref 135–145)
Sodium: 154 mmol/L — ABNORMAL HIGH (ref 135–145)

## 2020-03-26 LAB — CULTURE, BLOOD (ROUTINE X 2)
Culture: NO GROWTH
Culture: NO GROWTH

## 2020-03-26 LAB — COMPREHENSIVE METABOLIC PANEL
ALT: 29 U/L (ref 0–44)
AST: 15 U/L (ref 15–41)
Albumin: 2.3 g/dL — ABNORMAL LOW (ref 3.5–5.0)
Alkaline Phosphatase: 55 U/L (ref 38–126)
Anion gap: 8 (ref 5–15)
BUN: 23 mg/dL — ABNORMAL HIGH (ref 6–20)
CO2: 23 mmol/L (ref 22–32)
Calcium: 8.2 mg/dL — ABNORMAL LOW (ref 8.9–10.3)
Chloride: 126 mmol/L — ABNORMAL HIGH (ref 98–111)
Creatinine, Ser: 0.4 mg/dL — ABNORMAL LOW (ref 0.44–1.00)
GFR calc Af Amer: >60 mL/min (ref >60)
GFR calc non Af Amer: >60 mL/min (ref >60)
Glucose, Bld: 236 mg/dL — ABNORMAL HIGH (ref 70–99)
Potassium: 3.4 mmol/L — ABNORMAL LOW (ref 3.5–5.1)
Sodium: 157 mmol/L — ABNORMAL HIGH (ref 135–145)
Total Bilirubin: 0.8 mg/dL (ref 0.3–1.2)
Total Protein: 4.9 g/dL — ABNORMAL LOW (ref 6.5–8.1)

## 2020-03-26 LAB — MAGNESIUM: Magnesium: 2.3 mg/dL (ref 1.7–2.4)

## 2020-03-26 IMAGING — CT CT HEAD W/O CM
4 series · 16 of 47 positions shown, 18 images · non-contrast
Comparison: None.

CLINICAL DATA: Cerebral edema.  Status post biopsy.

EXAM:
CT HEAD WITHOUT CONTRAST
TECHNIQUE: Contiguous axial images were obtained from the base of the skull
through the vertex without intravenous contrast.

[Series 3: head without · axial · non-contrast · 0.41mm/px · z∈[-138,-18]mm · 7 of 33 slices shown, 9 images]
[im 5/33  brain]
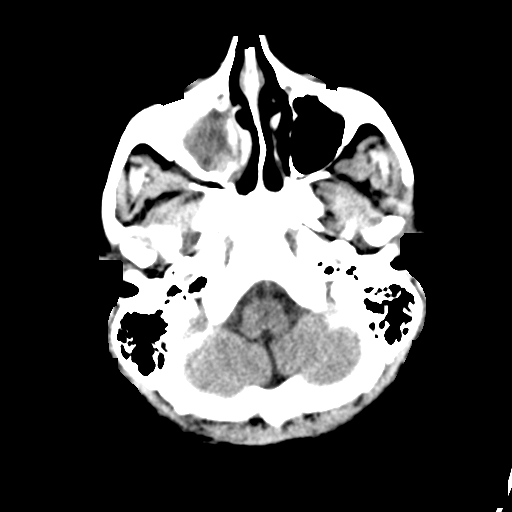
[im 5/33  bone]
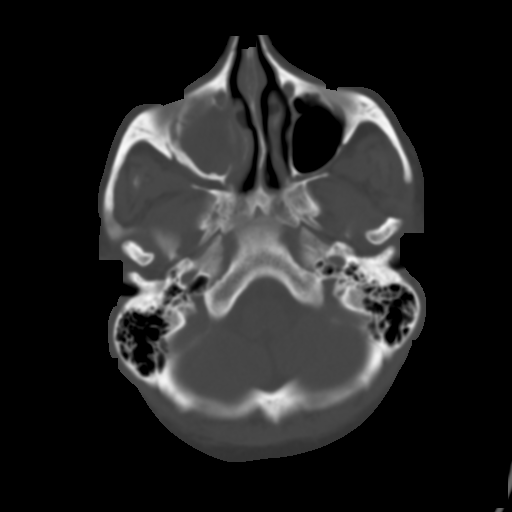
[im 9/33  brain]
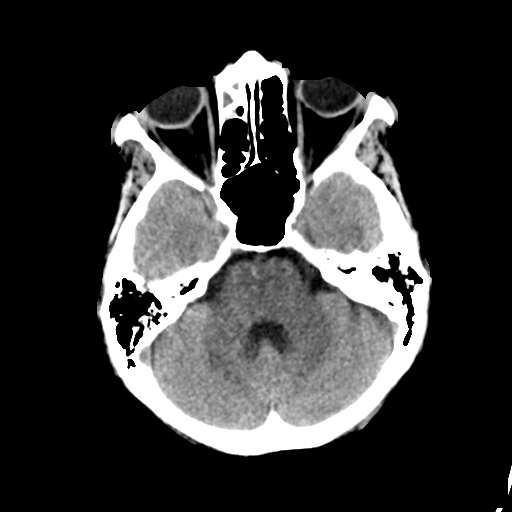
[im 13/33  brain]
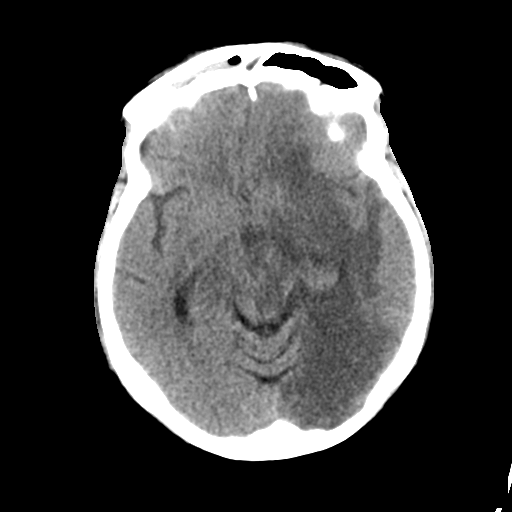
[im 17/33  brain]
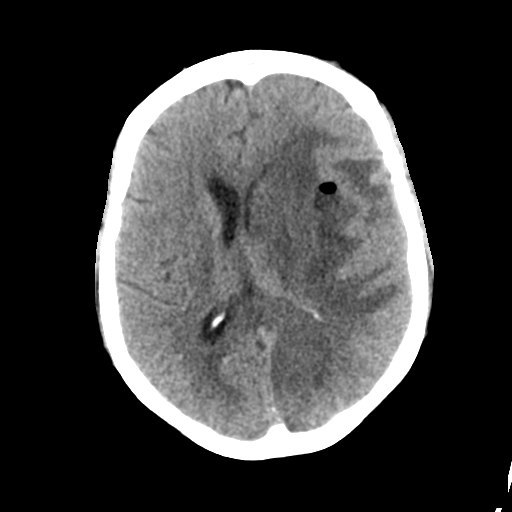
[im 21/33  brain]
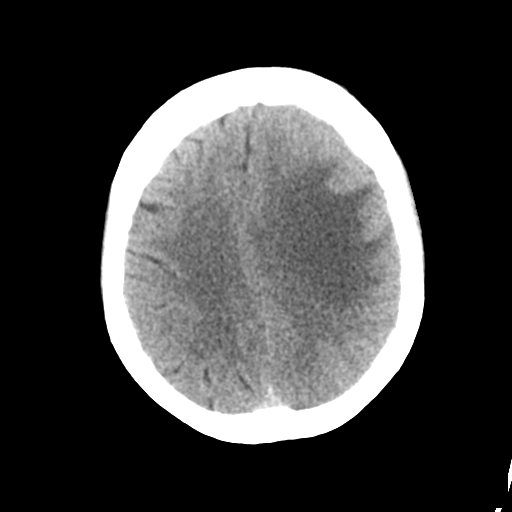
[im 21/33  bone]
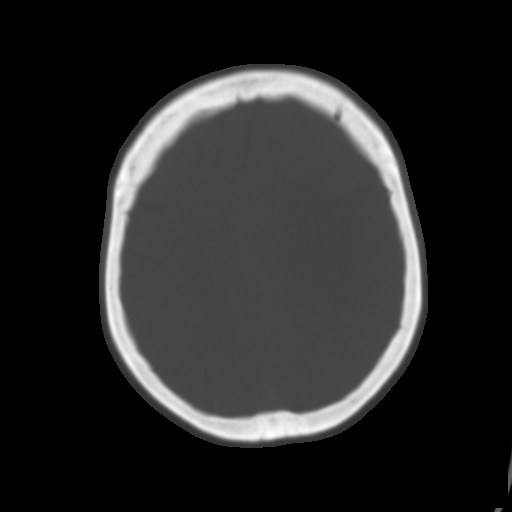
[im 25/33  brain]
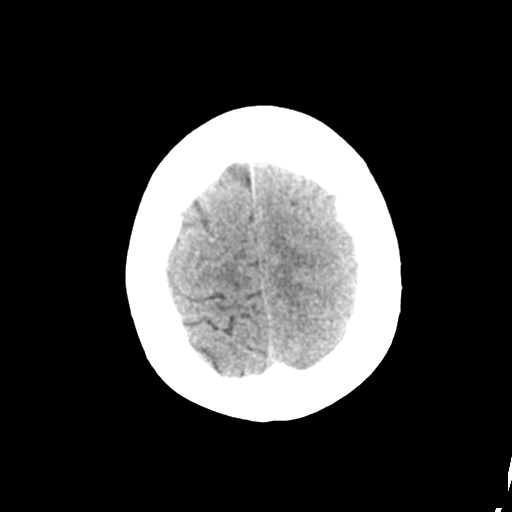
[im 29/33  brain]
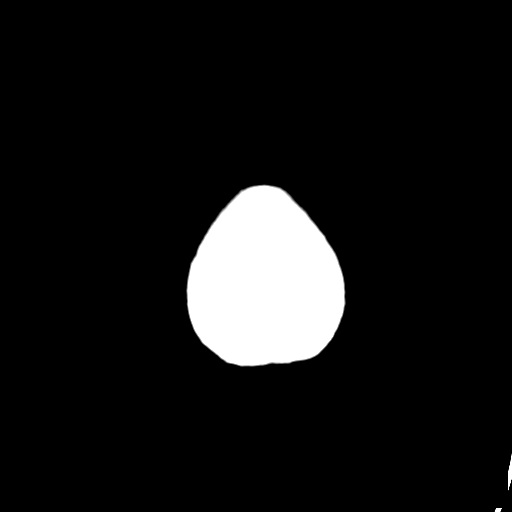

[Series 4: head bone · axial · 0.41mm/px · z∈[-142,-110]mm · 3 of 81 slices shown]
[im 9/81  bone]
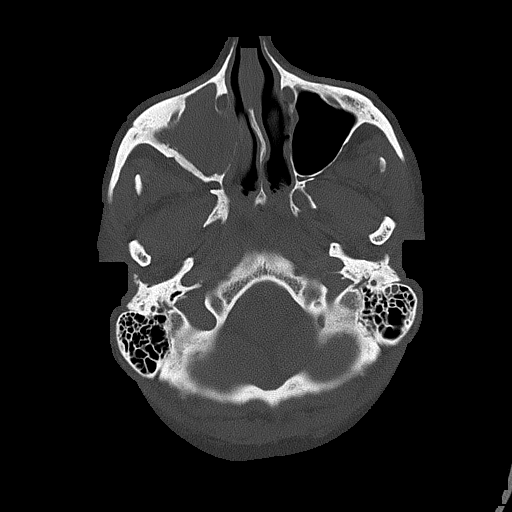
[im 17/81  bone]
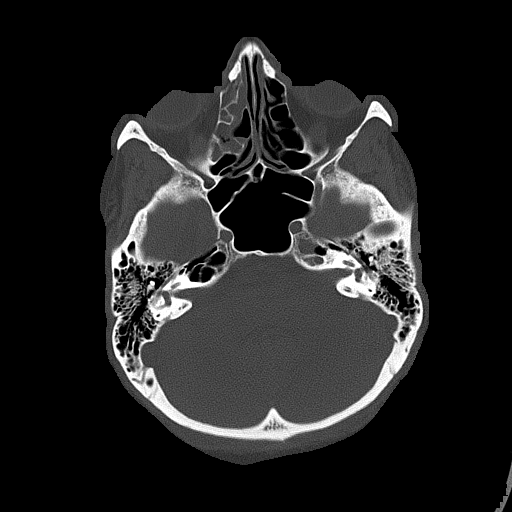
[im 25/81  bone]
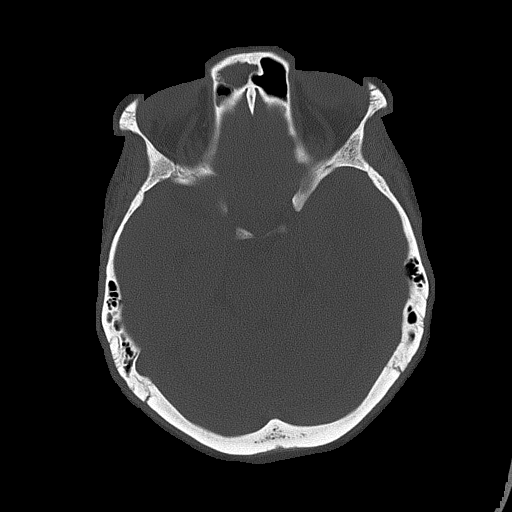

[Series 5: head without cor · coronal · non-contrast · 0.32mm/px · 3 of 67 slices shown]
[im 23/67  brain]
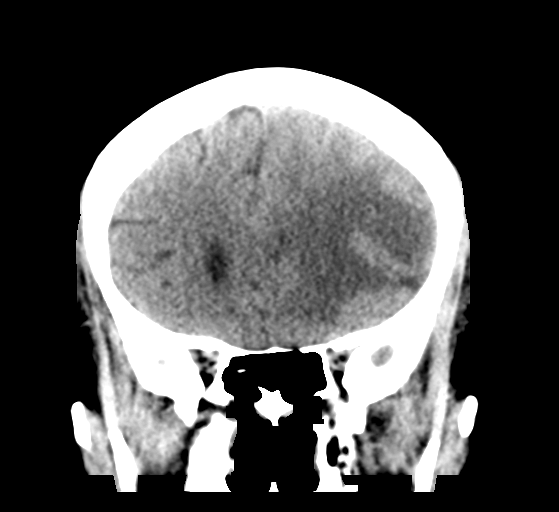
[im 30/67  brain]
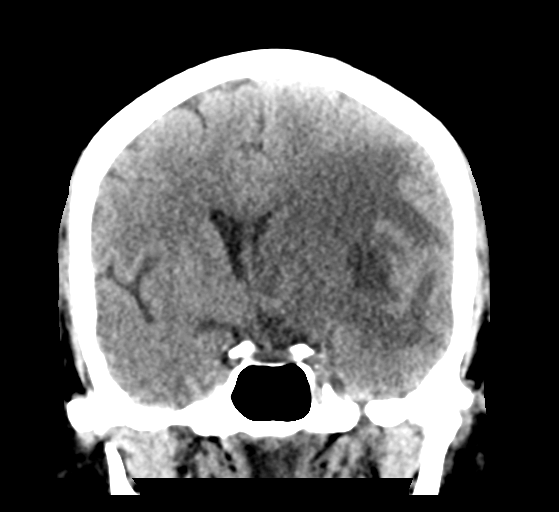
[im 37/67  brain]
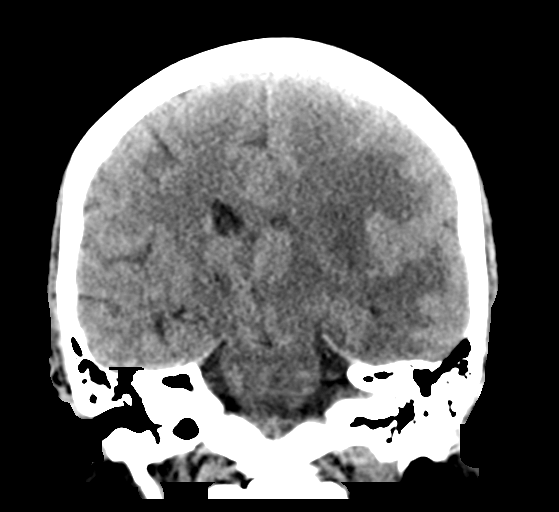

[Series 6: head without sag · sagittal · non-contrast · 0.32mm/px · 3 of 57 slices shown]
[im 19/57  brain]
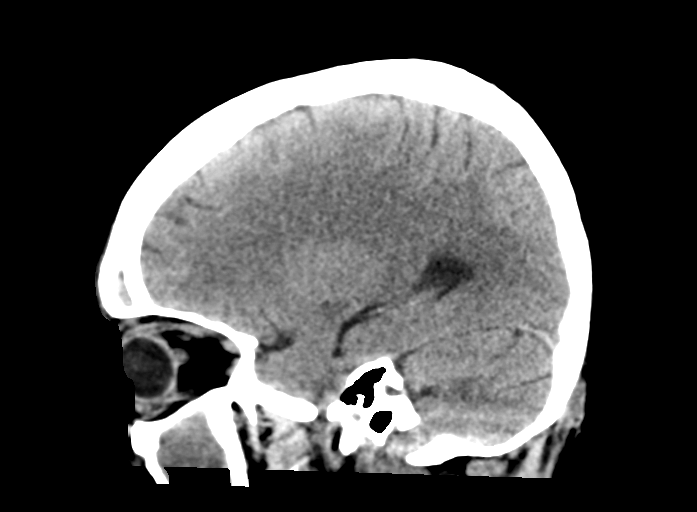
[im 29/57  brain]
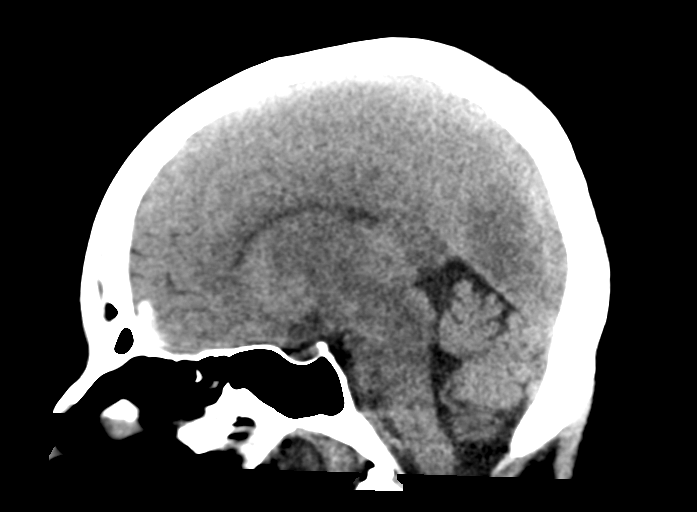
[im 38/57  brain]
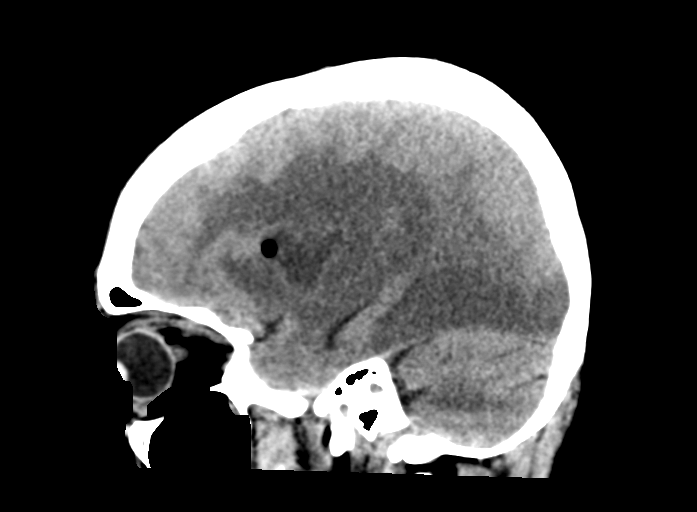

[16 of 47 positions shown; findings below may reference images not displayed]

FINDINGS: Brain: Decreased pneumocephalus at the left frontal biopsy site. The
degree of edema throughout the left hemisphere has improved
slightly. But, there is a new left posterior cerebral artery
territory infarct. Rightward midline shift measures 6 mm at the
level of the foramina of [place]. There is improved patency of the
basal cisterns. No hydrocephalus.

Vascular: No hyperdense vessel or unexpected calcification.

Skull: Normal. Negative for fracture or focal lesion.

Sinuses/Orbits: Complete opacification of the right frontal and
maxillary sinuses. Normal orbits.

Other: None
IMPRESSION: 1. Slightly improved edema throughout the left hemisphere with 6 mm
rightward midline shift. Improved patency of the basal cisterns.
2. Left posterior cerebral artery territory infarct.

## 2020-03-26 MED ORDER — POTASSIUM CHLORIDE 20 MEQ/15ML (10%) PO SOLN
40.0000 meq | Freq: Every day | ORAL | Status: DC
  Administered 2020-03-26 – 2020-04-07 (×10): 40 meq
  Filled 2020-03-26 (×11): qty 30

## 2020-03-26 MED ORDER — POTASSIUM CHLORIDE 20 MEQ/15ML (10%) PO SOLN
40.0000 meq | Freq: Once | ORAL | Status: DC
  Filled 2020-03-26: qty 30

## 2020-03-26 NOTE — Progress Notes (Signed)
Inpatient Diabetes Program Recommendations  AACE/ADA: New Consensus Statement on Inpatient Glycemic Control (2015)  Target Ranges:  Prepandial:   less than 140 mg/dL      Peak postprandial:   less than 180 mg/dL (1-2 hours)      Critically ill patients:  140 - 180 mg/dL   Lab Results  Component Value Date   GLUCAP 215 (H) 03/26/2020   HGBA1C 11.5 (H) 03/22/2020    Review of Glycemic Control Results for Vickie Alvarado, Vickie Alvarado (MRN [id number]) as of 03/25/2020 09:16  Ref. Range 03/24/2020 08:11 03/24/2020 11:33 03/24/2020 15:54 03/24/2020 19:56 03/24/2020 23:43 03/25/2020 03:38 03/25/2020 07:55  Glucose-Capillary Latest Ref Range: 70 - 99 mg/dL 223 (H) 361 (H) 224 (H) 136 (H) 203 (H) 221 (H) 200 (H)  Results for Vickie ANAHID, ESKELSON (MRN [id number]) as of 03/26/2020 09:12  Ref. Range 03/25/2020 07:55 03/25/2020 11:38 03/25/2020 15:33 03/25/2020 19:50 03/26/2020 07:52  Glucose-Capillary Latest Ref Range: 70 - 99 mg/dL 102 (H) 111 (H) 735 (H) 207 (H) 215 (H)   Diabetes history: None  Current orders for Inpatient glycemic control:  Lantus 10 units Daily Novolog 0-20 units Q4 hours  Decadron 10 mg Q6 hours Vital HP 45 ml/hour A1c 11.5% on 8/27 after 4 doses of decadron  Inpatient Diabetes Program Recommendations:    -  Increase Lantus to 15 units -  Add Novolog Tube Feed Coverage 5 units Q4 hours  Thanks,  Christena Deem RN, MSN, BC-ADM Inpatient Diabetes Coordinator Team Pager [phone] (8a-5p)

## 2020-03-26 NOTE — Progress Notes (Signed)
Pt transported to CT and back to room 4N room 22 without any complications.

## 2020-03-26 NOTE — Plan of Care (Signed)
  Problem: Clinical Measurements: Goal: Will remain free from infection Outcome: Progressing Goal: Diagnostic test results will improve Outcome: Progressing Goal: Respiratory complications will improve Outcome: Progressing Goal: Cardiovascular complication will be avoided Outcome: Progressing   Problem: Nutrition: Goal: Adequate nutrition will be maintained Outcome: Progressing   Problem: Elimination: Goal: Will not experience complications related to bowel motility Outcome: Progressing Goal: Will not experience complications related to urinary retention Outcome: Progressing   Problem: Safety: Goal: Ability to remain free from injury will improve Outcome: Progressing   Problem: Skin Integrity: Goal: Risk for impaired skin integrity will decrease Outcome: Progressing   

## 2020-03-26 NOTE — Progress Notes (Addendum)
NAME:  Vickie Alvarado, MRN:  [id number], DOB:  Sep 19, 1965, LOS: 6 ADMISSION DATE:  03/20/2020, CONSULTATION DATE:  03/22/20 REFERRING MD:  Dr. Jake Samples, CHIEF COMPLAINT:  Brain Mass    Brief History   Vickie Alvarado is a 54 y/o F with a history of HTN, HLD, COVID-19 infection (03/01/20), dental infections, and sinus infections presented found to have a 6.5 x 4.5 x 4 cm mass, likely abscess in the Left basal ganglia. S/p L frontal stereotactic biopsy/aspiration of the abscess and PCCM consulted to admit.  History of present illness   Vickie Alvarado is a 54 y/o F with a history of HTN, HLD, COVID-19 infection (03/01/20), dental infections, and sinus infections presented to [facility], after being found altered, with R sided weakness, dysarthria, and R facial droop. Vickie Alvarado had decreased  P.O. intake, decreased appetite, and projectile vomiting in addition to her other symptoms. MRI brain showed a 6.5 x 4.5 x 4 cm mass in the Left basal ganglia. Neurosurgery performed a L frontal stereotactic biopsy/aspiration of the abscess and PCCM consulted for admission to neurosurgical ICU.   Past Medical History  Anxiety  Bipolar Disorder Dental infection HTN HLD  Significant Hospital Events   8/25> Admitted 8/27> Aspiration of brain abscess, admitted ICU   Consults:  PCCM  Neurosurgery   Procedures:  8/27> L frontal stereotactic biopsy/aspiration of L basal ganglia mass   Significant Diagnostic Tests:  8/25 MR Brain> 6.5x4.5x4 cm mass in the epicenter of the L basal ganglia and radiating white matter tracts 8/29 MR Brain, angio head> post op changes from drainage of L basal ganglia without complication. Lesion now 5.1x2.4x2.7cm. Persistent vasogenic edema throughout L cerebral hemisphere with mildly worse L to R shift, now 57mm. Interval multifocal posterior circulation infarcts. MRA without large vessel occlusion. Scattered vascular irregularity involving basilar and bilateral PCAs without occlusion.  Micro Data:   8/26 BC x2> NGTD 8/27 Gram Stain> sent 8/27 Aerobic/Anaerobe> strep intermedius, pan sensitive  8/27 Fungus Culture with stain> Sent  8/27 Urine Culture<  <10k colonies    Antimicrobials:  8/27 Cefepime>> 8/27 8/27 Vanc>> 8/27 Metro>> 8/27 Ceftriaxone>> 8/27 Cefazolin>>8/27  Interim history/subjective:  Remains on 3% saline Propofol for sedation No significant change in neuro status Triglycerides 658 3 BM's 8/30 + 9 L K 3.4  Objective   Blood pressure (!) 160/72, pulse (!) 44, temperature 98.7 F (37.1 C), temperature source Axillary, resp. rate 13, weight 69.4 kg, SpO2 100 %.    Vent Mode: CPAP;PSV FiO2 (%):  [40 %] 40 % Set Rate:  [12 bmp] 12 bmp Vt Set:  [490 mL] 490 mL PEEP:  [5 cmH20] 5 cmH20 Pressure Support:  [10 cmH20] 10 cmH20 Plateau Pressure:  [11 cmH20-20 cmH20] 20 cmH20   Intake/Output Summary (Last 24 hours) at 03/26/2020 0851 Last data filed at 03/26/2020 0700 Gross per 24 hour  Intake 2335.12 ml  Output 1000 ml  Net 1335.12 ml   Filed Weights   03/23/20 0400 03/25/20 0500 03/26/20 0500  Weight: 69.2 kg 69.6 kg 69.4 kg   Examination: Physical Exam General: Critically ill appearing female, intubated and sedated, in NAD HEENT: Sutures in place without erythema or drainage. ETT OGT secure trachea midline Neuro: Sedated.  Localizes to pain on R. No movement to stimuli on L CV: S1, S2, RRR No RMG Pulm: Bilateral chest excursion bilaterally. Clear BS throughout, diminished per bases.  mechanically ventilated GI: Soft, NT, ND, BS + GU: foley with clear amber urine MSK: No obvious joint deformity.Normal muscle bulk,  No cyanosis or clubbing.  Skin: c/d/w without rash or lesions  Resolved Hospital Problem list     Assessment & Plan:      Cerebral abscess s/p drainage, requiring initiation of hypertonic saline to limit cerebral edema -strep intermedius from tissue path  New PCA infarct secondary to inflammation/mass effect from abscess.  Acute  toxic metabolic encephalopathy Bipolar Disorder Plan: -Continue propofol (minimize ICP spikes, minimize coughing) -Continue 3% saline per Dr. Denese Killings  -Continue rocephin  -No stroke intervention. Neurosurgery continues to follow  - continue decadron - continue Keppra  - CT head shows Slightly improved edema throughout the left hemisphere with 6 mm rightward midline shift.  Improved patency of the basal cisterns. Left posterior cerebral artery territory infarct  Acute hypoxic respiratory failure requiring mechanical ventilation -Continue full MV support -VAP, pulm hygiene (may need intermittently increased sedation during pulm hygiene to prevent ICP spikes)  -Discussed with family at bedside with PCCM physician-- if no further decline, may be reasonable to consider trach/peg   Hypertension Dyslipidemia -Cleviprex for SBP goal  < 150 -ICU monitoring   Hyperglycemia -SSI  Hypokalemia, mild -replace, continue to trend BMP  - Will check and replete mag   Discussed with Dr. Denese Killings. Will stop propofol   As triglycerides are > 650  Continue 3% saline  Best practice:  Diet: EN Pain/Anxiety/Delirium protocol (if indicated): Sedation d/c'd 8/31 DVT prophylaxis: Subq Heparin Glucose control: SSI  Mobility: Bed rest  Code Status: FULL  Family Communication: daughter updated at bedside 8/31. Appreciative of ongoing care and transparent GOC discussions Disposition: ICU  Labs   CBC: Recent Labs  Lab 03/20/20 1011 03/21/20 0616 03/22/20 0411 03/22/20 1512 03/23/20 0437 03/23/20 0525 03/24/20 0534 03/25/20 0525 03/26/20 0531  WBC 9.5   < > 13.9*  --  15.0*  --  12.2* 9.9 8.2  NEUTROABS 6.7  --   --   --   --   --  9.9* 7.7 5.9  HGB 12.5   < > 13.3   < > 11.8* 11.2* 10.3* 9.7* 9.5*  HCT 37.6   < > 39.0   < > 34.3* 33.0* 31.5* 30.1* 29.8*  MCV 88.1   < > 85.9  --  86.0  --  90.0 92.0 94.3  PLT 360   < > 363  --  315  --  247 228 197   < > = values in this interval not  displayed.    Basic Metabolic Panel: Recent Labs  Lab 03/22/20 0411 03/22/20 1512 03/23/20 0437 03/23/20 0437 03/23/20 0525 03/23/20 0525 03/23/20 1048 03/23/20 1048 03/23/20 1732 03/23/20 2355 03/24/20 0534 03/24/20 1130 03/24/20 1727 03/24/20 2351 03/25/20 0525 03/25/20 1242 03/25/20 1730 03/25/20 2319 03/26/20 0531  NA 134*   < > 137   < > 139   < > 138   < > 144   < > 147*   < > 151*   < > 154* 157* 157* 155* 157*  K 3.6   < > 4.1  --  3.9  --   --   --   --   --  2.9*  --   --   --  3.3*  --   --   --  3.4*  CL 103  --  106  --   --   --   --   --   --   --  118*  --   --   --  125*  --   --   --  126*  CO2 17*  --  20*  --   --   --   --   --   --   --  21*  --   --   --  21*  --   --   --  23  GLUCOSE 173*  --  206*  --   --   --   --   --   --   --  182*  --   --   --  207*  --   --   --  236*  BUN 11  --  6  --   --   --   --   --   --   --  13  --   --   --  19  --   --   --  23*  CREATININE 0.44  --  0.47  --   --   --   --   --   --   --  0.43*  --   --   --  0.49  --   --   --  0.40*  CALCIUM 8.9  --  8.0*  --   --   --   --   --   --   --  8.0*  --   --   --  8.2*  --   --   --  8.2*  MG 1.9  --   --   --   --   --  1.8  --  2.1  --  2.0  --  2.2  --   --   --   --   --   --   PHOS  --   --   --   --   --   --  2.0*  --  2.3*  --  2.3*  --  1.7*  --   --   --   --   --   --    < > = values in this interval not displayed.   GFR: Estimated Creatinine Clearance: 77.3 mL/min (A) (by C-G formula based on SCr of 0.4 mg/dL (L)). Recent Labs  Lab 03/23/20 0437 03/24/20 0534 03/25/20 0525 03/26/20 0531  WBC 15.0* 12.2* 9.9 8.2    Liver Function Tests: Recent Labs  Lab 03/20/20 1011 03/21/20 0616 03/24/20 0534 03/25/20 0525 03/26/20 0531  AST 11* 9* 11* 15 15  ALT 14 12 12 20 29   ALKPHOS 103 93 60 60 55  BILITOT 0.6 0.7 0.5 0.4 0.8  PROT 7.2 6.7 5.1* 4.8* 4.9*  ALBUMIN 3.2* 3.0* 2.3* 2.3* 2.3*   No results for input(s): LIPASE, AMYLASE in the last  168 hours. No results for input(s): AMMONIA in the last 168 hours.  ABG    Component Value Date/Time   PHART 7.458 (H) 03/23/2020 0525   PCO2ART 29.1 (L) 03/23/2020 0525   PO2ART 106 03/23/2020 0525   HCO3 20.6 03/23/2020 0525   TCO2 21 (L) 03/23/2020 0525   ACIDBASEDEF 2.0 03/23/2020 0525   O2SAT 98.0 03/23/2020 0525     Coagulation Profile: Recent Labs  Lab 03/20/20 1011  INR 1.2    Cardiac Enzymes: No results for input(s): CKTOTAL, CKMB, CKMBINDEX, TROPONINI in the last 168 hours.  HbA1C: Hgb A1c MFr Bld  Date/Time Value Ref Range Status  03/22/2020 04:55 PM 11.5 (H) 4.8 - 5.6 % Final    Comment:    (NOTE) Pre diabetes:  5.7%-6.4%  Diabetes:              >6.4%  Glycemic control for   <7.0% adults with diabetes     CBG: Recent Labs  Lab 03/25/20 0755 03/25/20 1138 03/25/20 1533 03/25/20 1950 03/26/20 0752  GLUCAP 200* 191* 169* 207* 215*   CRITICAL CARE Performed by: Bevelyn Ngo   Total critical care time: 33  minutes  Critical care time was exclusive of separately billable procedures and treating other patients. Critical care was necessary to treat or prevent imminent or life-threatening deterioration.  Critical care was time spent personally by me on the following activities: development of treatment plan with Vickie Alvarado and/or surrogate as well as nursing, discussions with consultants, evaluation of Vickie Alvarado's response to treatment, examination of Vickie Alvarado, obtaining history from Vickie Alvarado or surrogate, ordering and performing treatments and interventions, ordering and review of laboratory studies, ordering and review of radiographic studies, pulse oximetry and re-evaluation of Vickie Alvarado's condition.  Bevelyn Ngo, MSN, AGACNP-BC [facility] See Amion for personal pager PCCM on call pager [phone] 03/26/2020, 8:51 AM

## 2020-03-26 NOTE — Progress Notes (Signed)
   Providing Compassionate, Quality Care - Together  NEUROSURGERY PROGRESS NOTE   S: No issues overnight. Weaning sedation  O: EXAM:  BP (!) 152/68   Pulse (!) 47   Temp 98.7 F (37.1 C) (Axillary)   Resp 11   Wt 69.4 kg   SpO2 100%   BMI 23.96 kg/m   Intubated Eyes open spont, tracking around room PERRL Face symmetric BL exten posture to pain Incision c//d/i  ASSESSMENT:  54 y.o. female with  1. Left frontal abscess with mass effect 2. 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 Intermedius - HTS 3%, Na goal 145-155 - keppra - decadron - continue supportive care - dvt ppx w SQH - guarded prognosis -CT this am shows slight improvement, continue aggressive care  Thank you for allowing me to participate in this patient's care.  Please do not hesitate to call with questions or concerns.   Monia Pouch, DO Neurosurgeon [facility] Cell: [phone]

## 2020-03-27 ENCOUNTER — Inpatient Hospital Stay (HOSPITAL_COMMUNITY): Payer: Medicare HMO

## 2020-03-27 DIAGNOSIS — G9389 Other specified disorders of brain: Secondary | ICD-10-CM

## 2020-03-27 DIAGNOSIS — I63432 Cerebral infarction due to embolism of left posterior cerebral artery: Secondary | ICD-10-CM

## 2020-03-27 DIAGNOSIS — J9601 Acute respiratory failure with hypoxia: Secondary | ICD-10-CM

## 2020-03-27 LAB — AEROBIC/ANAEROBIC CULTURE W GRAM STAIN (SURGICAL/DEEP WOUND)

## 2020-03-27 LAB — CBC WITH DIFFERENTIAL/PLATELET
Abs Immature Granulocytes: 0.12 10*3/uL — ABNORMAL HIGH (ref 0.00–0.07)
Basophils Absolute: 0 10*3/uL (ref 0.0–0.1)
Basophils Relative: 0 %
Eosinophils Absolute: 0 10*3/uL (ref 0.0–0.5)
Eosinophils Relative: 0 %
HCT: 31.3 % — ABNORMAL LOW (ref 36.0–46.0)
Hemoglobin: 9.8 g/dL — ABNORMAL LOW (ref 12.0–15.0)
Immature Granulocytes: 1 %
Lymphocytes Relative: 13 %
Lymphs Abs: 1.6 10*3/uL (ref 0.7–4.0)
MCH: 29 pg (ref 26.0–34.0)
MCHC: 31.3 g/dL (ref 30.0–36.0)
MCV: 92.6 fL (ref 80.0–100.0)
Monocytes Absolute: 0.6 10*3/uL (ref 0.1–1.0)
Monocytes Relative: 5 %
Neutro Abs: 10.1 10*3/uL — ABNORMAL HIGH (ref 1.7–7.7)
Neutrophils Relative %: 81 %
Platelets: 224 10*3/uL (ref 150–400)
RBC: 3.38 MIL/uL — ABNORMAL LOW (ref 3.87–5.11)
RDW: 15.9 % — ABNORMAL HIGH (ref 11.5–15.5)
WBC: 12.4 10*3/uL — ABNORMAL HIGH (ref 4.0–10.5)
nRBC: 0.2 % (ref 0.0–0.2)

## 2020-03-27 LAB — MAGNESIUM: Magnesium: 2.1 mg/dL (ref 1.7–2.4)

## 2020-03-27 LAB — COMPREHENSIVE METABOLIC PANEL
ALT: 64 U/L — ABNORMAL HIGH (ref 0–44)
AST: 24 U/L (ref 15–41)
Albumin: 2.5 g/dL — ABNORMAL LOW (ref 3.5–5.0)
Alkaline Phosphatase: 55 U/L (ref 38–126)
Anion gap: 8 (ref 5–15)
BUN: 24 mg/dL — ABNORMAL HIGH (ref 6–20)
CO2: 26 mmol/L (ref 22–32)
Calcium: 8.5 mg/dL — ABNORMAL LOW (ref 8.9–10.3)
Chloride: 118 mmol/L — ABNORMAL HIGH (ref 98–111)
Creatinine, Ser: 0.52 mg/dL (ref 0.44–1.00)
GFR calc Af Amer: >60 mL/min (ref >60)
GFR calc non Af Amer: >60 mL/min (ref >60)
Glucose, Bld: 253 mg/dL — ABNORMAL HIGH (ref 70–99)
Potassium: 3.5 mmol/L (ref 3.5–5.1)
Sodium: 152 mmol/L — ABNORMAL HIGH (ref 135–145)
Total Bilirubin: 0.3 mg/dL (ref 0.3–1.2)
Total Protein: 5.1 g/dL — ABNORMAL LOW (ref 6.5–8.1)

## 2020-03-27 LAB — TRIGLYCERIDES: Triglycerides: 90 mg/dL (ref <150)

## 2020-03-27 LAB — GLUCOSE, CAPILLARY
Glucose-Capillary: 236 mg/dL — ABNORMAL HIGH (ref 70–99)
Glucose-Capillary: 230 mg/dL — ABNORMAL HIGH (ref 70–99)
Glucose-Capillary: 231 mg/dL — ABNORMAL HIGH (ref 70–99)
Glucose-Capillary: 247 mg/dL — ABNORMAL HIGH (ref 70–99)
Glucose-Capillary: 256 mg/dL — ABNORMAL HIGH (ref 70–99)
Glucose-Capillary: 269 mg/dL — ABNORMAL HIGH (ref 70–99)

## 2020-03-27 LAB — SODIUM: Sodium: 149 mmol/L — ABNORMAL HIGH (ref 135–145)

## 2020-03-27 IMAGING — DX DG CHEST 1V PORT
1 series · 1 of 1 positions shown · non-contrast
Comparison: [DATE]

CLINICAL DATA: Respiratory failure.

EXAM:
PORTABLE CHEST 1 VIEW

[chest]
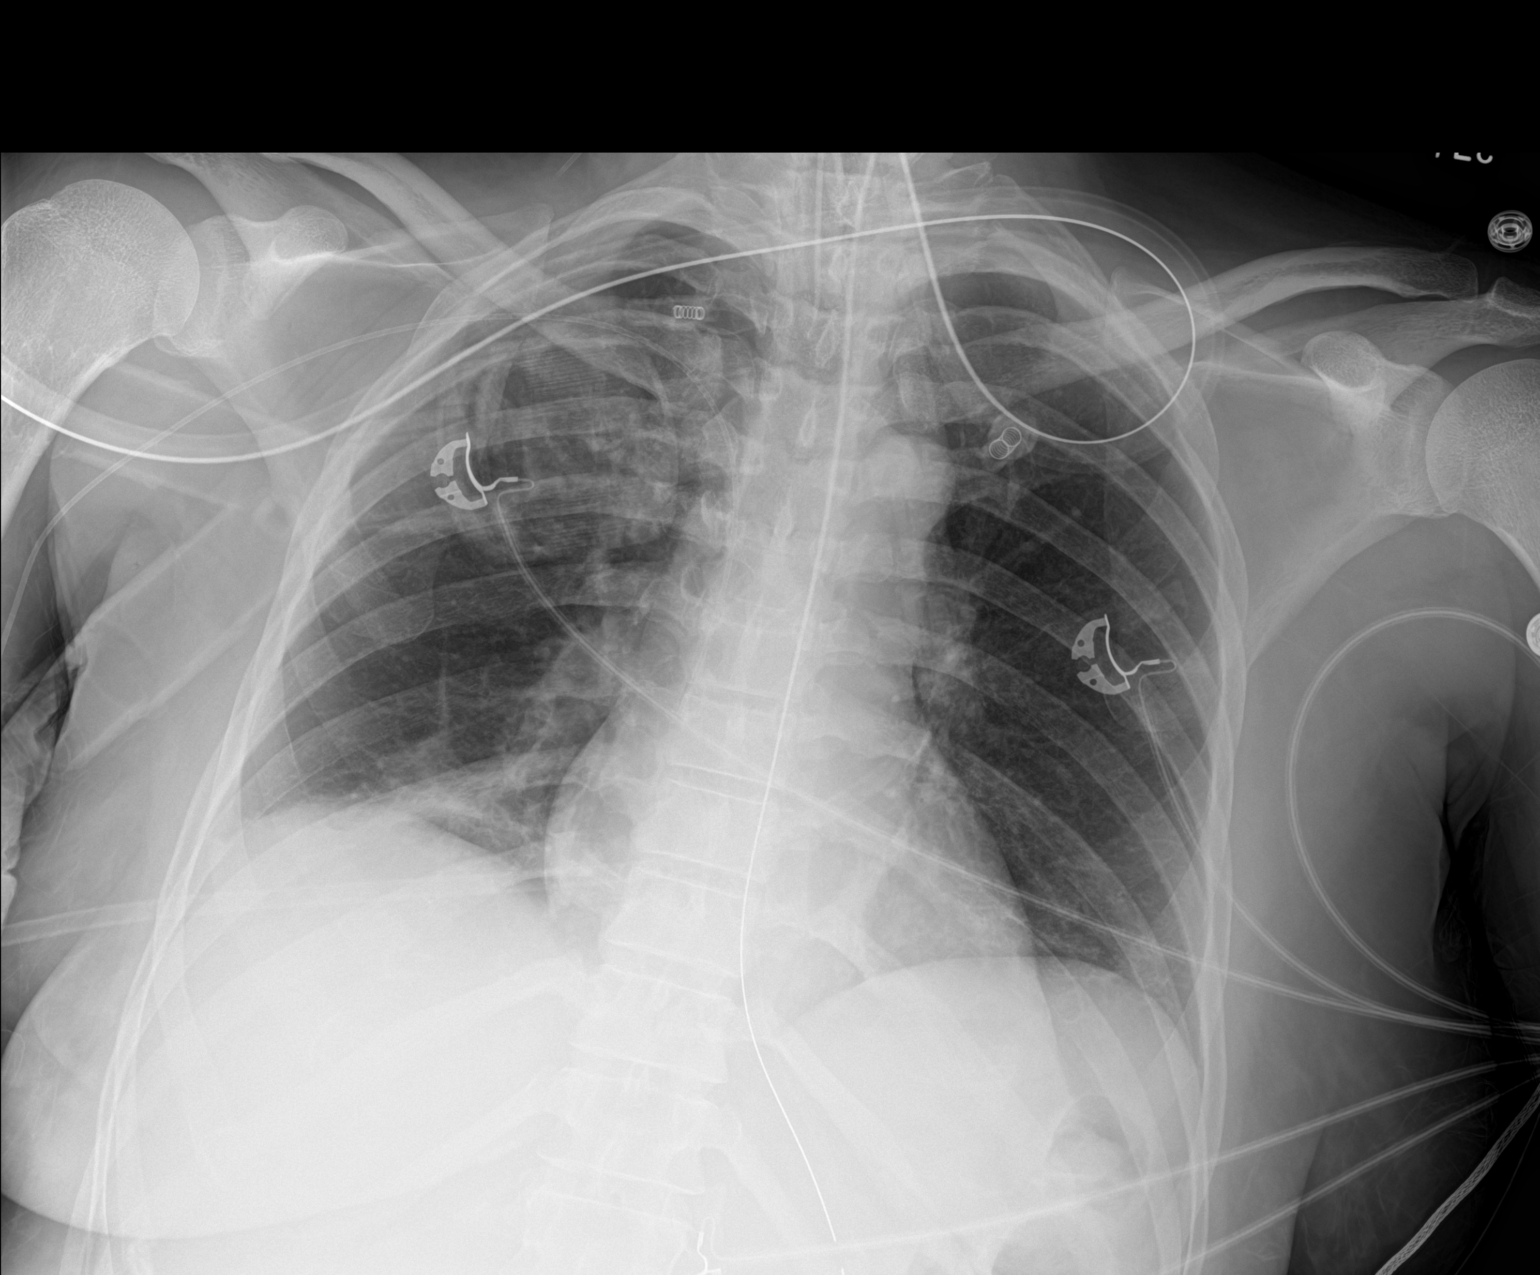

[1 of 1 positions shown; findings below may reference images not displayed]

FINDINGS: [YF] hours. Endotracheal tube tip has pulled back in the interval
and is now approximately 6.3 cm above the base of the carina. The NG
tube passes into the stomach although the distal tip position is not
included on the film. Interval improvement in basilar aeration with
some persistent/residual streaky opacity in the bases, likely
atelectasis. No pulmonary edema or pleural effusion. The visualized
bony structures of the thorax show no acute abnormality. Telemetry
leads overlie the chest.
IMPRESSION: 1. Interval repositioning of the endotracheal tube.
2. Interval improvement in basilar aeration with some residual
probable atelectasis at the lung bases.

## 2020-03-27 MED ORDER — AMLODIPINE BESYLATE 5 MG PO TABS
5.0000 mg | ORAL_TABLET | Freq: Every day | ORAL | Status: DC
  Administered 2020-03-27 – 2020-04-18 (×20): 5 mg
  Filled 2020-03-27 (×20): qty 1

## 2020-03-27 NOTE — Progress Notes (Signed)
NAME:  Vickie Alvarado, MRN:  [id number], DOB:  [birth year], LOS: 7 ADMISSION DATE:  03/20/2020, CONSULTATION DATE:  03/22/20 REFERRING MD:  Dr. 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 S/p L frontal stereotactic biopsy/aspiration of the abscess and PCCM consulted to admit.  History of present illness   Patient is a 54 y/o F with a history of HTN, HLD, COVID-19 infection (03/01/20), dental infections, and sinus infections presented to [facility], after being found altered, with R sided weakness, dysarthria, and R facial droop. Patient had decreased  P.O. intake, decreased appetite, and projectile vomiting in addition to her other symptoms. MRI brain showed a 6.5 x 4.5 x 4 cm mass in the Left basal ganglia. Neurosurgery performed a L frontal stereotactic biopsy/aspiration of the abscess and PCCM consulted for admission to neurosurgical ICU.   Past Medical History  Anxiety  Bipolar Disorder Dental infection HTN HLD  Significant Hospital Events   8/25> Admitted 8/27> Aspiration of brain abscess, admitted ICU   Consults:  PCCM  Neurosurgery   Procedures:  8/27> L frontal stereotactic biopsy/aspiration of L basal ganglia mass   Significant Diagnostic Tests:  8/25 MR Brain> 6.5x4.5x4 cm mass in the epicenter of the L basal ganglia and radiating white matter tracts 8/29 MR Brain, angio head> post op changes from drainage of L basal ganglia without complication. Lesion now 5.1x2.4x2.7cm. Persistent vasogenic edema throughout L cerebral hemisphere with mildly worse L to R shift, now 63mm. Interval multifocal posterior circulation infarcts. MRA without large vessel occlusion. Scattered vascular irregularity involving basilar and bilateral PCAs without occlusion.  CT head 8/31 >>  slightly improved edema of L hemisphere, 66mm R shift, improved patency basal cisterns.   Micro Data:  8/26 BC x2> NGTD 8/27 Gram Stain> sent 8/27 Aerobic/Anaerobe> strep intermedius, pan sensitive  8/27 Fungus Culture with stain> Sent  8/27 Urine Culture<  <10k colonies    Antimicrobials:  8/27 Cefepime>> 8/27 8/27 Vanc>> 8/27 Metro>> 8/27 Ceftriaxone>> 8/27 Cefazolin>>8/27  Interim history/subjective:   Propofol stopped on 8/31.  Remains unresponsive, no sedation given 3% saline still running at 5 cc/h, sodium 152 No other new findings Most recent CT head 8/31  Objective   Blood pressure (!) 161/72, pulse (!) 57, temperature 99.4 F (37.4 C), temperature source Axillary, resp. rate 16, weight 69.4 kg, SpO2 99 %.    Vent Mode: PSV;CPAP FiO2 (%):  [40 %] 40 % Set Rate:  [12 bmp] 12 bmp Vt Set:  [490 mL] 490 mL PEEP:  [5 cmH20] 5 cmH20 Pressure Support:  [5 cmH20-10 cmH20] 5 cmH20 Plateau Pressure:  [14 cmH20-18 cmH20] 14 cmH20   Intake/Output Summary (Last 24 hours) at 03/27/2020 0901 Last data filed at 03/27/2020 0748 Gross per 24 hour  Intake 950.86 ml  Output 2250 ml  Net -1299.14 ml   Filed Weights   03/23/20 0400 03/25/20 0500 03/26/20 0500  Weight: 69.2 kg 69.6 kg 69.4 kg   Examination: Physical Exam General: Ill appearing, ventilated,  HEENT: cranial suture CDI, ETT, OP clear Neuro: Completely unresponsive GCS3 of sedation, does have spont resp drive, corneals, gag CV: regular, 60's, no M Pulm: clear B, mechanical BS GI: noon distended, + BS, TF running GU: foley with clear amber urine MSK: no  deformities Skin: no rash  Resolved Hospital Problem list     Assessment & Plan:      Cerebral abscess s/p drainage, requiring initiation of hypertonic saline to limit cerebral edema -strep intermedius from tissue path  New PCA infarct secondary to inflammation/mass effect from abscess.  Acute toxic metabolic encephalopathy Bipolar  Disorder Plan: Propofol stopped on 8/31 Note improved edema on CT head.  Sodium currently 152 at low-dose 3% saline.  Plan to stop on 9/1 and allow Na to drift down, follow-up with neurosurgery to ensure they agree.  Follow sodium q12h for now Continue ceftriaxone Continue Keppra Continue dexamethasone  Acute hypoxic respiratory failure requiring mechanical ventilation PSV as she can tolerate.  Clearly does not have the mental status for airway protection and extubation. VAP prevention order set Pulmonary hygiene Considering possible tracheostomy to allow continued care, follow-up for neurological improvement long-term.  Hypertension Dyslipidemia Cleviprex off Hydralazine and labetalol available prn, SBP goal 160 Start low dose amlodipine per tube on 9/1 ICU monitoring   Hyperglycemia SSI and lantus 10u   Hypokalemia, mild, improved Follow BMP    Best practice:  Diet: EN Pain/Anxiety/Delirium protocol (if indicated): Sedation d/c'd 8/31 DVT prophylaxis: Subq Heparin Glucose control: SSI   Mobility: Bed rest  Code Status: FULL  Family Communication: spoke w niece at bedside, daughter by phone 9/1 Disposition: ICU  Labs   CBC: Recent Labs  Lab 03/20/20 1011 03/21/20 0616 03/23/20 0437 03/23/20 0437 03/23/20 0525 03/24/20 0534 03/25/20 0525 03/26/20 0531 03/27/20 0443  WBC 9.5   < > 15.0*  --   --  12.2* 9.9 8.2 12.4*  NEUTROABS 6.7  --   --   --   --  9.9* 7.7 5.9 10.1*  HGB 12.5   < > 11.8*   < > 11.2* 10.3* 9.7* 9.5* 9.8*  HCT 37.6   < > 34.3*   < > 33.0* 31.5* 30.1* 29.8* 31.3*  MCV 88.1   < > 86.0  --   --  90.0 92.0 94.3 92.6  PLT 360   < > 315  --   --  247 228 197 224   < > = values in this interval not displayed.    Basic Metabolic Panel: Recent Labs  Lab 03/22/20 0411 03/23/20 0437 03/23/20 0437 03/23/20 0525 03/23/20 1048 03/23/20 1048 03/23/20 1732 03/23/20 2355 03/24/20 0534 03/24/20 1130 03/24/20 1727 03/24/20 2351 03/25/20 0525  03/25/20 1242 03/26/20 0531 03/26/20 1217 03/26/20 1743 03/26/20 2311 03/27/20 0443  NA   < > 137   < > 139 138   < > 144   < > 147*   < > 151*   < > 154*   < > 157* 156* 155* 154* 152*  K  --  4.1   < > 3.9  --   --   --   --  2.9*  --   --   --  3.3*  --  3.4*  --   --   --  3.5  CL  --  106  --   --   --   --   --   --  118*  --   --   --  125*  --  126*  --   --   --  118*  CO2  --  20*  --   --   --   --   --   --  21*  --   --   --  21*  --  23  --   --   --  26  GLUCOSE  --  206*  --   --   --   --   --   --  182*  --   --   --  207*  --  236*  --   --   --  253*  BUN  --  6  --   --   --   --   --   --  13  --   --   --  19  --  23*  --   --   --  24*  CREATININE  --  0.47  --   --   --   --   --   --  0.43*  --   --   --  0.49  --  0.40*  --   --   --  0.52  CALCIUM  --  8.0*  --   --   --   --   --   --  8.0*  --   --   --  8.2*  --  8.2*  --   --   --  8.5*  MG   < >  --   --   --  1.8   < > 2.1  --  2.0  --  2.2  --   --   --   --  2.3  --   --  2.1  PHOS  --   --   --   --  2.0*  --  2.3*  --  2.3*  --  1.7*  --   --   --   --   --   --   --   --    < > = values in this interval not displayed.   GFR: Estimated Creatinine Clearance: 77.3 mL/min (by C-G formula based on SCr of 0.52 mg/dL). Recent Labs  Lab 03/24/20 0534 03/25/20 0525 03/26/20 0531 03/27/20 0443  WBC 12.2* 9.9 8.2 12.4*    Liver Function Tests: Recent Labs  Lab 03/21/20 0616 03/24/20 0534 03/25/20 0525 03/26/20 0531 03/27/20 0443  AST 9* 11* 15 15 24   ALT 12 12 20 29  64*  ALKPHOS 93 60 60 55 55  BILITOT 0.7 0.5 0.4 0.8 0.3  PROT 6.7 5.1* 4.8* 4.9* 5.1*  ALBUMIN 3.0* 2.3* 2.3* 2.3* 2.5*   No results for input(s): LIPASE, AMYLASE in the last 168 hours. No results for input(s): AMMONIA in the last 168 hours.  ABG    Component Value Date/Time   PHART 7.458 (H) 03/23/2020 0525   PCO2ART 29.1 (L) 03/23/2020 0525   PO2ART 106 03/23/2020 0525   HCO3 20.6 03/23/2020 0525   TCO2 21 (L)  03/23/2020 0525   ACIDBASEDEF 2.0 03/23/2020 0525   O2SAT 98.0 03/23/2020 0525     Coagulation Profile: Recent Labs  Lab 03/20/20 1011  INR 1.2    Cardiac Enzymes: No results for input(s): CKTOTAL, CKMB, CKMBINDEX, TROPONINI in the last 168 hours.  HbA1C: Hgb A1c MFr Bld  Date/Time Value Ref Range Status  03/22/2020 04:55 PM 11.5 (H) 4.8 - 5.6 % Final    Comment:    (NOTE) Pre diabetes:          5.7%-6.4%  Diabetes:              >6.4%  Glycemic control for   <7.0% adults with diabetes  CBG: Recent Labs  Lab 03/26/20 1524 03/26/20 1927 03/26/20 2316 03/27/20 0320 03/27/20 0716  GLUCAP 208* 197* 194* 236* 230*   CRITICAL CARE  Independent CC time 33 minutes  Levy Pupa, MD, PhD 03/27/2020, 9:20 AM [facility] [phone] or if no answer [phone]

## 2020-03-27 NOTE — Progress Notes (Signed)
   Providing Compassionate, Quality Care - Together  NEUROSURGERY PROGRESS NOTE   S: No issues overnight.   O: EXAM:  BP (!) 161/72   Pulse (!) 57   Temp 99.4 F (37.4 C) (Axillary)   Resp 16   Wt 69.4 kg   SpO2 99%   BMI 23.96 kg/m   Intubated Eyes open to pain PERRL Face symmetric BL exten posture to pain Incision c/d/i  ASSESSMENT:  54 y.o. female with  1. Left frontal abscess with mass effect 2. 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 Intermedius - HTS 3%, Na goal 145-155, weaning off - keppra - decadron - continue supportive care - dvt ppx w SQH - guarded prognosis   Thank you for allowing me to participate in this patient's care.  Please do not hesitate to call with questions or concerns.   Monia Pouch, DO Neurosurgeon [facility] Cell: [phone]

## 2020-03-27 NOTE — Progress Notes (Signed)
Inpatient Diabetes Program Recommendations  AACE/ADA: New Consensus Statement on Inpatient Glycemic Control (2015)  Target Ranges:  Prepandial:   less than 140 mg/dL      Peak postprandial:   less than 180 mg/dL (1-2 hours)      Critically ill patients:  140 - 180 mg/dL   Lab Results  Component Value Date   GLUCAP 230 (H) 03/27/2020   HGBA1C 11.5 (H) 03/22/2020    Review of Glycemic Control  Results for Vickie Alvarado, Vickie Alvarado (MRN [id number]) as of 03/27/2020 10:29  Ref. Range 03/26/2020 07:52 03/26/2020 11:49 03/26/2020 15:24 03/26/2020 19:27 03/26/2020 23:16 03/27/2020 03:20 03/27/2020 07:16  Glucose-Capillary Latest Ref Range: 70 - 99 mg/dL 517 (H) 001 (H) 749 (H) 197 (H) 194 (H) 236 (H) 230 (H)   Diabetes history: None  Current orders for Inpatient glycemic control:  Lantus 10 units Daily Novolog 0-20 units Q4 hours  Decadron 10 mg Q6 hours Vital HP 45 ml/hour A1c 11.5% on 8/27 after 4 doses of decadron  Inpatient Diabetes Program Recommendations:    -  Increase Lantus to 15 units -  Add Novolog Tube Feed Coverage 5 units Q4 hours  Thanks,  Christena Deem RN, MSN, BC-ADM Inpatient Diabetes Coordinator Team Pager [phone] (8a-5p)

## 2020-03-28 ENCOUNTER — Inpatient Hospital Stay (HOSPITAL_COMMUNITY): Payer: Medicare HMO

## 2020-03-28 LAB — BASIC METABOLIC PANEL
Anion gap: 9 (ref 5–15)
BUN: 23 mg/dL — ABNORMAL HIGH (ref 6–20)
CO2: 26 mmol/L (ref 22–32)
Calcium: 8.7 mg/dL — ABNORMAL LOW (ref 8.9–10.3)
Chloride: 109 mmol/L (ref 98–111)
Creatinine, Ser: 0.43 mg/dL — ABNORMAL LOW (ref 0.44–1.00)
GFR calc Af Amer: >60 mL/min (ref >60)
GFR calc non Af Amer: >60 mL/min (ref >60)
Glucose, Bld: 247 mg/dL — ABNORMAL HIGH (ref 70–99)
Potassium: 3.8 mmol/L (ref 3.5–5.1)
Sodium: 144 mmol/L (ref 135–145)

## 2020-03-28 LAB — CBC
HCT: 34.2 % — ABNORMAL LOW (ref 36.0–46.0)
Hemoglobin: 10.6 g/dL — ABNORMAL LOW (ref 12.0–15.0)
MCH: 28.6 pg (ref 26.0–34.0)
MCHC: 31 g/dL (ref 30.0–36.0)
MCV: 92.4 fL (ref 80.0–100.0)
Platelets: 232 10*3/uL (ref 150–400)
RBC: 3.7 MIL/uL — ABNORMAL LOW (ref 3.87–5.11)
RDW: 15.5 % (ref 11.5–15.5)
WBC: 13.5 10*3/uL — ABNORMAL HIGH (ref 4.0–10.5)
nRBC: 0.1 % (ref 0.0–0.2)

## 2020-03-28 LAB — GLUCOSE, CAPILLARY
Glucose-Capillary: 228 mg/dL — ABNORMAL HIGH (ref 70–99)
Glucose-Capillary: 206 mg/dL — ABNORMAL HIGH (ref 70–99)
Glucose-Capillary: 221 mg/dL — ABNORMAL HIGH (ref 70–99)
Glucose-Capillary: 215 mg/dL — ABNORMAL HIGH (ref 70–99)
Glucose-Capillary: 225 mg/dL — ABNORMAL HIGH (ref 70–99)
Glucose-Capillary: 237 mg/dL — ABNORMAL HIGH (ref 70–99)

## 2020-03-28 LAB — MAGNESIUM: Magnesium: 2.2 mg/dL (ref 1.7–2.4)

## 2020-03-28 LAB — TRIGLYCERIDES: Triglycerides: 106 mg/dL (ref <150)

## 2020-03-28 IMAGING — DX DG CHEST 1V PORT
1 series · 1 of 1 positions shown · non-contrast
Comparison: [DATE].

CLINICAL DATA: Respiratory failure.  Hypoxia.  COVID positive.

EXAM:
PORTABLE CHEST 1 VIEW

[chest]
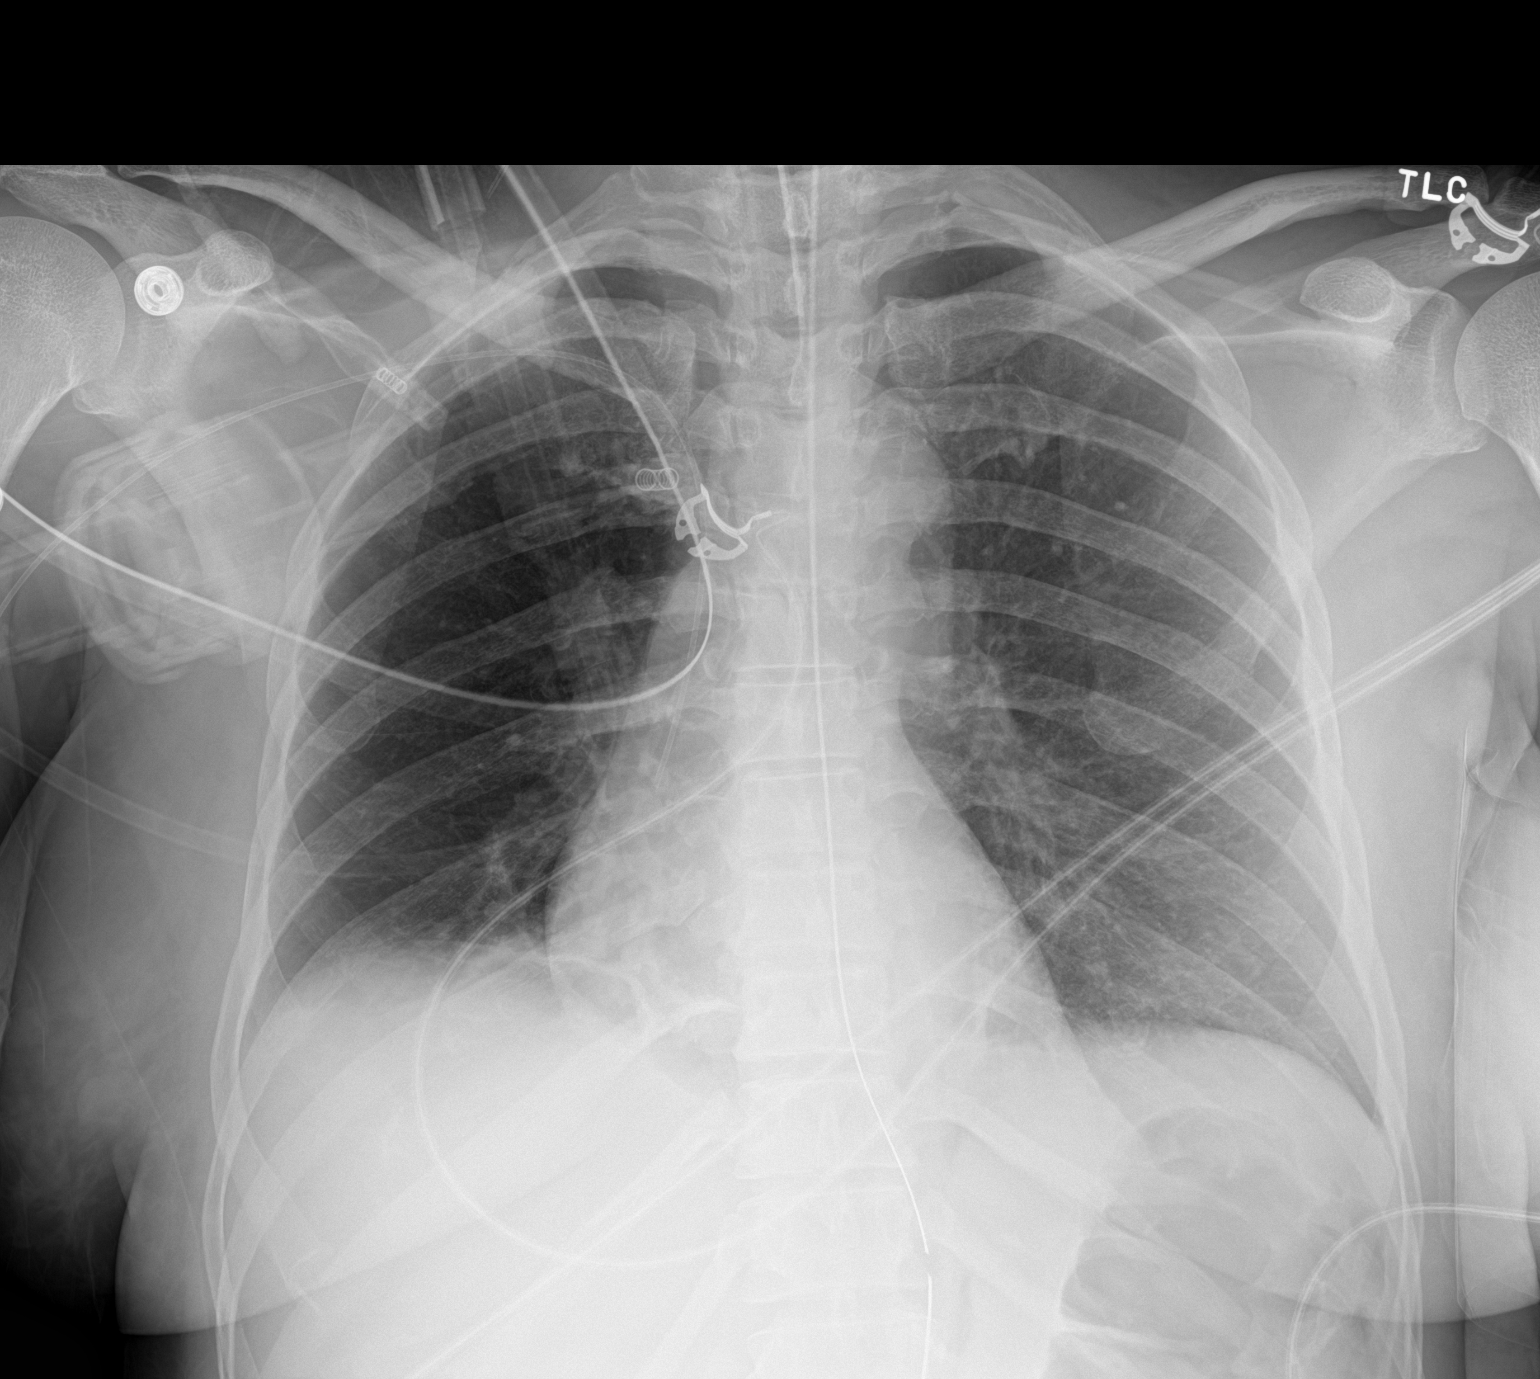

[1 of 1 positions shown; findings below may reference images not displayed]

FINDINGS: Endotracheal tube and NG tube in stable position. Right PICC line
stable position. Heart size stable. Low lung volumes with persistent
bibasilar atelectasis/infiltrates. Interim improvement in aeration
from prior exam. No prominent pleural effusion. No pneumothorax.
IMPRESSION: 1.  Lines and tubes stable position.

2. Low lung volumes with persistent bibasilar
atelectasis/infiltrates. Interim improvement in aeration from prior
exam.

## 2020-03-28 MED ORDER — INSULIN GLARGINE 100 UNIT/ML ~~LOC~~ SOLN
15.0000 [IU] | Freq: Every day | SUBCUTANEOUS | Status: DC
  Administered 2020-03-28 – 2020-03-29 (×2): 15 [IU] via SUBCUTANEOUS
  Filled 2020-03-28 (×2): qty 0.15

## 2020-03-28 MED ORDER — JEVITY 1.2 CAL PO LIQD
1000.0000 mL | ORAL | Status: DC
  Administered 2020-03-28 – 2020-04-09 (×9): 1000 mL
  Filled 2020-03-28 (×23): qty 1000

## 2020-03-28 MED ORDER — PROSOURCE TF PO LIQD
45.0000 mL | Freq: Two times a day (BID) | ORAL | Status: DC
  Administered 2020-03-28 – 2020-04-10 (×18): 45 mL
  Filled 2020-03-28 (×18): qty 45

## 2020-03-28 NOTE — Progress Notes (Signed)
Nutrition Follow-up  DOCUMENTATION CODES:   Not applicable  INTERVENTION:   Initiate tube feeding via OG tube: Jevity 1.2 at 65 ml/h (1560 ml per day) Prosource TF 45 ml BID  Provides 1952 kcal, 108 gm protein, 1265 ml free water daily    NUTRITION DIAGNOSIS:   Increased nutrient needs related to post-op healing as evidenced by estimated needs. Ongoing.   GOAL:   Patient will meet greater than or equal to 90% of their needs Progressing.   MONITOR:   Vent status, Skin, TF tolerance, Weight trends, Labs, I & O's  REASON FOR ASSESSMENT:   Ventilator, Consult Enteral/tube feeding initiation and management  ASSESSMENT:   Patient with PMH significant for HTN, HLD, COVID 19 infection (03/01/20), and dental infections. Presents this admission with R sided hemiplegia and R sided facial droop. 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  8/27 Pt with abscess in L basal ganglia s/p L frontal biopsy/aspiration  Pt discussed during ICU rounds and with RN.  Per CCM pt may need trach/PEG depending on goals of care.  3% and propofol are off  Patient is currently intubated on ventilator support MV: 9.2 L/min Temp (24hrs), Avg:99.2 F (37.3 C), Min:99 F (37.2 C), Max:99.5 F (37.5 C)  Medications reviewed and include: decadron, colace, SSI, lantus, miralax, 40 mEq KCl daily Labs reviewed:  Lab Results  Component Value Date   HGBA1C 11.5 (H) 03/22/2020   CBG's: 206-269   Noted deep pitting edema to upper extremeties   Current TF:  Vital High Protein @ 45 ml  ProSource TF 45 ml TID Provides: 1200 kcal and 128 grams protein    Diet Order:   Diet Order            Diet NPO time specified  Diet effective now                 EDUCATION NEEDS:   Not appropriate for education at this time  Skin:  Skin Assessment: Skin Integrity Issues: Skin Integrity Issues:: Incisions Incisions: L head  Last BM:  9/2 medium type 7  Height:    Ht Readings from Last 1 Encounters:  03/01/20 5\' 7"  (1.702 m)    Weight:   Wt Readings from Last 1 Encounters:  03/28/20 69.5 kg    Ideal Body Weight:     BMI:  Body mass index is 24 kg/m.  Estimated Nutritional Needs:   Kcal:  05/28/20  Protein:  105-135 grams  Fluid:  >/= 1.7 L/day  [id number]., RD, LDN, CNSC See AMiON for contact information

## 2020-03-28 NOTE — Progress Notes (Signed)
NAME:  Vickie Alvarado, MRN:  [id number], DOB:  [birth year], LOS: 8 ADMISSION DATE:  03/20/2020, CONSULTATION DATE:  03/22/20 REFERRING MD:  Dr. 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 S/p L frontal stereotactic biopsy/aspiration of the abscess and PCCM consulted to admit.  History of present illness   Patient is a 54 y/o F with a history of HTN, HLD, COVID-19 infection (03/01/20), dental infections, and sinus infections presented to [facility], after being found altered, with R sided weakness, dysarthria, and R facial droop. Patient had decreased  P.O. intake, decreased appetite, and projectile vomiting in addition to her other symptoms. MRI brain showed a 6.5 x 4.5 x 4 cm mass in the Left basal ganglia. Neurosurgery performed a L frontal stereotactic biopsy/aspiration of the abscess and PCCM consulted for admission to neurosurgical ICU.   Past Medical History  Anxiety  Bipolar Disorder Dental infection HTN HLD  Significant Hospital Events   8/25> Admitted 8/27> Aspiration of brain abscess, admitted ICU   Consults:  PCCM  Neurosurgery   Procedures:  8/27> L frontal stereotactic biopsy/aspiration of L basal ganglia mass   Significant Diagnostic Tests:  8/25 MR Brain> 6.5x4.5x4 cm mass in the epicenter of the L basal ganglia and radiating white matter tracts 8/29 MR Brain, angio head> post op changes from drainage of L basal ganglia without complication. Lesion now 5.1x2.4x2.7cm. Persistent vasogenic edema throughout L cerebral hemisphere with mildly worse L to R shift, now 53mm. Interval multifocal posterior circulation infarcts. MRA without large vessel occlusion. Scattered vascular irregularity involving basilar and bilateral PCAs without occlusion.  CT head 8/31 >>  slightly improved edema of L hemisphere, 69mm R shift, improved patency basal cisterns.   Micro Data:  8/26 BC x2> NGTD 8/27 Gram Stain> sent 8/27 Aerobic/Anaerobe> strep intermedius, pan sensitive  8/27 Fungus Culture with stain> Sent  8/27 Urine Culture<  <10k colonies    Antimicrobials:  8/27 Cefepime>> 8/27 8/27 Vanc>> 8/27 Metro>> 8/27 Ceftriaxone>> 8/27 Cefazolin>>8/27  Interim history/subjective:   Has been off propofol since 8/31 3% saline (low-dose) stopped on 9/1, sodium down to 144 I/O+ 8 L total Currently tolerating pressure support ventilation without difficulty  Objective   Blood pressure (!) 148/78, pulse 72, temperature 99.1 F (37.3 C), temperature source Axillary, resp. rate (!) 22, weight 69.5 kg, SpO2 99 %.    Vent Mode: PSV;CPAP FiO2 (%):  [40 %] 40 % Set Rate:  [12 bmp] 12 bmp Vt Set:  [490 mL] 490 mL PEEP:  [5 cmH20] 5 cmH20 Pressure Support:  [5 cmH20] 5 cmH20 Plateau Pressure:  [13 cmH20-18 cmH20] 18 cmH20   Intake/Output Summary (Last 24 hours) at 03/28/2020 0818 Last data filed at 03/28/2020 0600 Gross per 24 hour  Intake 1990.63 ml  Output 1900 ml  Net 90.63 ml   Filed Weights   03/25/20 0500 03/26/20 0500 03/28/20 0500  Weight: 69.6 kg 69.4 kg 69.5 kg   Examination: Physical Exam General: Ill-appearing woman, on MV HEENT: Cranial sutures clean dry, ET tube in place, oropharynx otherwise clear Neuro: Eyes are spontaneously open, positive spontaneous dry debris, positive corneals and gag.  Did not track, did not follow commands or move any extremities CV: Regular, distant, no murmur Pulm: Clear bilaterally, no crackles  or wheezes good chest expansion GI: Nondistended with positive bowel sounds, tolerating tube feeding GU: Foley with clear urine MSK: No deformity Skin: No rash  Resolved Hospital Problem list     Assessment & Plan:      Cerebral abscess s/p drainage, requiring initiation of hypertonic saline to limit cerebral  edema -strep intermedius from tissue path  New PCA infarct secondary to inflammation/mass effect from abscess.  Acute toxic metabolic encephalopathy Bipolar Disorder Plan: Continue to follow neurological status for any improvement off continuous sedation Low-dose 3% saline stopped on 9/1, sodium has drifted down to 144.  Goal 145-155 per neurosurgery notes.  Continue to follow.  May need to consider restarting 3% saline.  Will discuss with neurosurgery prior to Repeat imaging as per neurosurgery plans Continue ceftriaxone as ordered Continue dexamethasone Keppra currently 500 mg twice daily  Acute hypoxic respiratory failure requiring mechanical ventilation Tolerating PSV without difficulty but does not have the mental status and airway protection to consider extubation.  Depending on long-term prognosis consider tracheostomy/PEG.  Unclear that patient's daughters would want to pursue this unless chances for meaningful recovery are improved. Continue pulmonary hygiene VAP prevention order set  Hypertension Dyslipidemia Cleviprex is off since early 9/1 Started amlodipine 9/1 Hydralazine and labetalol available as needed.  SBP goal 160 ICU monitoring   Hyperglycemia, glucose 239 on 9/2 Sliding-scale insulin Increase Lantus to 15 units daily on 9/2  Hypokalemia, mild, improved Follow BMP   Best practice:  Diet: Tube feeding Pain/Anxiety/Delirium protocol (if indicated): Sedation d/c'd 8/31 DVT prophylaxis: Subq Heparin Glucose control: SSI plus Lantus Mobility: Bed rest  Code Status: FULL  Family Communication: spoke w niece at bedside, daughter by phone 9/1 Disposition: ICU  Labs   CBC: Recent Labs  Lab 03/24/20 0534 03/25/20 0525 03/26/20 0531 03/27/20 0443 03/28/20 0409  WBC 12.2* 9.9 8.2 12.4* 13.5*  NEUTROABS 9.9* 7.7 5.9 10.1*  --   HGB 10.3* 9.7* 9.5* 9.8* 10.6*  HCT 31.5* 30.1* 29.8* 31.3* 34.2*  MCV 90.0 92.0 94.3 92.6 92.4  PLT 247 228 197 224 232     Basic Metabolic Panel: Recent Labs  Lab 03/23/20 1048 03/23/20 1048 03/23/20 1732 03/23/20 2355 03/24/20 0534 03/24/20 1130 03/24/20 1727 03/24/20 2351 03/25/20 0525 03/25/20 1242 03/26/20 0531 03/26/20 0531 03/26/20 1217 03/26/20 1217 03/26/20 1743 03/26/20 2311 03/27/20 0443 03/27/20 1800 03/28/20 0409  NA 138   < > 144   < > 147*   < > 151*   < > 154*   < > 157*   < > 156*   < > 155* 154* 152* 149* 144  K  --   --   --   --  2.9*  --   --   --  3.3*  --  3.4*  --   --   --   --   --  3.5  --  3.8  CL  --   --   --   --  118*  --   --   --  125*  --  126*  --   --   --   --   --  118*  --  109  CO2  --   --   --   --  21*  --   --   --  21*  --  23  --   --   --   --   --  26  --  26  GLUCOSE  --   --   --   --  182*  --   --   --  207*  --  236*  --   --   --   --   --  253*  --  247*  BUN  --   --   --   --  13  --   --   --  19  --  23*  --   --   --   --   --  24*  --  23*  CREATININE  --   --   --   --  0.43*  --   --   --  0.49  --  0.40*  --   --   --   --   --  0.52  --  0.43*  CALCIUM  --   --   --   --  8.0*  --   --   --  8.2*  --  8.2*  --   --   --   --   --  8.5*  --  8.7*  MG 1.8   < > 2.1  --  2.0  --  2.2  --   --   --   --   --  2.3  --   --   --  2.1  --  2.2  PHOS 2.0*  --  2.3*  --  2.3*  --  1.7*  --   --   --   --   --   --   --   --   --   --   --   --    < > = values in this interval not displayed.   GFR: Estimated Creatinine Clearance: 77.3 mL/min (A) (by C-G formula based on SCr of 0.43 mg/dL (L)). Recent Labs  Lab 03/25/20 0525 03/26/20 0531 03/27/20 0443 03/28/20 0409  WBC 9.9 8.2 12.4* 13.5*    Liver Function Tests: Recent Labs  Lab 03/24/20 0534 03/25/20 0525 03/26/20 0531 03/27/20 0443  AST 11* 15 15 24   ALT 12 20 29  64*  ALKPHOS 60 60 55 55  BILITOT 0.5 0.4 0.8 0.3  PROT 5.1* 4.8* 4.9* 5.1*  ALBUMIN 2.3* 2.3* 2.3* 2.5*   No results for input(s): LIPASE, AMYLASE in the last 168 hours. No results for input(s): AMMONIA  in the last 168 hours.  ABG    Component Value Date/Time   PHART 7.458 (H) 03/23/2020 0525   PCO2ART 29.1 (L) 03/23/2020 0525   PO2ART 106 03/23/2020 0525   HCO3 20.6 03/23/2020 0525   TCO2 21 (L) 03/23/2020 0525   ACIDBASEDEF 2.0 03/23/2020 0525   O2SAT 98.0 03/23/2020 0525     Coagulation Profile: No results for input(s): INR, PROTIME in the last 168 hours.  Cardiac Enzymes: No results for input(s): CKTOTAL, CKMB, CKMBINDEX, TROPONINI in the last 168 hours.  HbA1C: Hgb A1c MFr Bld  Date/Time Value Ref Range Status  03/22/2020 04:55 PM 11.5 (H) 4.8 - 5.6 % Final    Comment:    (NOTE) Pre diabetes:          5.7%-6.4%  Diabetes:              >6.4%  Glycemic control for   <7.0% adults with diabetes     CBG: Recent Labs  Lab 03/27/20 1558 03/27/20 1936 03/27/20 2319 03/28/20 0334 03/28/20 0747  GLUCAP 247* 256* 269* 228* 206*   CRITICAL CARE  Independent CC time 32 minutes  05/28/20, MD,  PhD 03/28/2020, 8:18 AM [facility] [phone] or if no answer [phone]

## 2020-03-28 NOTE — Progress Notes (Signed)
   Providing Compassionate, Quality Care - Together  NEUROSURGERY PROGRESS NOTE   S: No issues overnight.   O: EXAM:  BP 137/82   Pulse 83   Temp 99.2 F (37.3 C) (Axillary)   Resp (!) 22   Wt 69.5 kg   SpO2 99%   BMI 24.00 kg/m   Intubated Eyes open to pain Looks around room PERRL Face symmetric BLE WD to pain No motor response in BUE Incision c/d/i  ASSESSMENT:  54 y.o. female with  1. Left frontal abscess with mass effect 2. 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 Intermedius - keep off HTS, monitor Na, goal now normonatremia - keppra - decadron - continue supportive care - dvt ppx w SQH - guarded prognosis   Thank you for allowing me to participate in this patient's care.  Please do not hesitate to call with questions or concerns.   Monia Pouch, DO Neurosurgeon [facility] Cell: [phone]

## 2020-03-29 DIAGNOSIS — G936 Cerebral edema: Secondary | ICD-10-CM

## 2020-03-29 LAB — BASIC METABOLIC PANEL
Anion gap: 8 (ref 5–15)
BUN: 17 mg/dL (ref 6–20)
CO2: 27 mmol/L (ref 22–32)
Calcium: 8.6 mg/dL — ABNORMAL LOW (ref 8.9–10.3)
Chloride: 105 mmol/L (ref 98–111)
Creatinine, Ser: 0.41 mg/dL — ABNORMAL LOW (ref 0.44–1.00)
GFR calc Af Amer: >60 mL/min (ref >60)
GFR calc non Af Amer: >60 mL/min (ref >60)
Glucose, Bld: 254 mg/dL — ABNORMAL HIGH (ref 70–99)
Potassium: 3.7 mmol/L (ref 3.5–5.1)
Sodium: 140 mmol/L (ref 135–145)

## 2020-03-29 LAB — GLUCOSE, CAPILLARY
Glucose-Capillary: 220 mg/dL — ABNORMAL HIGH (ref 70–99)
Glucose-Capillary: 221 mg/dL — ABNORMAL HIGH (ref 70–99)
Glucose-Capillary: 201 mg/dL — ABNORMAL HIGH (ref 70–99)
Glucose-Capillary: 202 mg/dL — ABNORMAL HIGH (ref 70–99)
Glucose-Capillary: 259 mg/dL — ABNORMAL HIGH (ref 70–99)
Glucose-Capillary: 259 mg/dL — ABNORMAL HIGH (ref 70–99)

## 2020-03-29 LAB — PHOSPHORUS: Phosphorus: 3.7 mg/dL (ref 2.5–4.6)

## 2020-03-29 LAB — CBC
HCT: 35.1 % — ABNORMAL LOW (ref 36.0–46.0)
Hemoglobin: 11.2 g/dL — ABNORMAL LOW (ref 12.0–15.0)
MCH: 29.6 pg (ref 26.0–34.0)
MCHC: 31.9 g/dL (ref 30.0–36.0)
MCV: 92.9 fL (ref 80.0–100.0)
Platelets: 230 10*3/uL (ref 150–400)
RBC: 3.78 MIL/uL — ABNORMAL LOW (ref 3.87–5.11)
RDW: 15.4 % (ref 11.5–15.5)
WBC: 17.4 10*3/uL — ABNORMAL HIGH (ref 4.0–10.5)
nRBC: 0 % (ref 0.0–0.2)

## 2020-03-29 LAB — MAGNESIUM: Magnesium: 2.1 mg/dL (ref 1.7–2.4)

## 2020-03-29 MED ORDER — INSULIN GLARGINE 100 UNIT/ML ~~LOC~~ SOLN
18.0000 [IU] | Freq: Every day | SUBCUTANEOUS | Status: DC
  Administered 2020-03-30: 18 [IU] via SUBCUTANEOUS
  Filled 2020-03-29 (×2): qty 0.18

## 2020-03-29 NOTE — Progress Notes (Signed)
Inpatient Diabetes Program Recommendations  AACE/ADA: New Consensus Statement on Inpatient Glycemic Control (2015)  Target Ranges:  Prepandial:   less than 140 mg/dL      Peak postprandial:   less than 180 mg/dL (1-2 hours)      Critically ill patients:  140 - 180 mg/dL   Lab Results  Component Value Date   GLUCAP 221 (H) 03/29/2020   HGBA1C 11.5 (H) 03/22/2020    Review of Glycemic Control Results for Vickie Alvarado, Vickie Alvarado (MRN [id number]) as of 03/29/2020 09:55  Ref. Range 03/28/2020 17:10 03/28/2020 19:23 03/28/2020 23:16 03/29/2020 03:43 03/29/2020 07:48  Glucose-Capillary Latest Ref Range: 70 - 99 mg/dL 008 (H) 676 (H) 195 (H) 220 (H) 221 (H)   Diabetes history: None noted Outpatient Diabetes medications: None noted Current orders for Inpatient glycemic control:  Novolog resistant q 4 hours Jevity 65 ml/ hr Decadron 10 mg q 6 hours Lantus 15 units daily  Inpatient Diabetes Program Recommendations:    Please add Novolog tube feed coverage 3 units q 4 hours.    Thanks  Beryl Meager, RN, BC-ADM Inpatient Diabetes Coordinator Pager [phone] (8a-5p)

## 2020-03-29 NOTE — Progress Notes (Signed)
   Providing Compassionate, Quality Care - Together  NEUROSURGERY PROGRESS NOTE   S: No issues overnight.  O: EXAM:  BP 133/74 (BP Location: Left Arm)   Pulse 70   Temp 99 F (37.2 C) (Axillary)   Resp 17   Wt 69.5 kg   SpO2 99%   BMI 24.00 kg/m   Intubated Eyes opento pain Looks around room PERRL Face symmetric BUE/BLE WD to pain Incision c/d/i  ASSESSMENT:  55 y.o. female with  1. Left frontal abscess with mass effect 2. 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 Intermedius - keep off HTS, monitor Na, goal now normonatremia - keppra - decadron, wean off soon - continue supportive care - dvt ppx w SQH - guarded prognosis -trach/peg Monday planned    Thank you for allowing me to participate in this patient's care.  Please do not hesitate to call with questions or concerns.   Monia Pouch, DO Neurosurgeon [facility] Cell: [phone]

## 2020-03-29 NOTE — Plan of Care (Signed)
?  Problem: Elimination: ?Goal: Will not experience complications related to urinary retention ?Outcome: Progressing ?  ?

## 2020-03-29 NOTE — Progress Notes (Signed)
NAME:  Vickie Alvarado, MRN:  [id number], DOB:  24-Sep-1965, LOS: 9 ADMISSION DATE:  03/20/2020, CONSULTATION DATE:  03/22/20 REFERRING MD:  Dr. 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 S/p L frontal stereotactic biopsy/aspiration of the abscess by neurosurgery and PCCM consulted to admit.  Past Medical History  Anxiety  Bipolar Disorder Dental infection HTN HLD  Significant Hospital Events   8/25> Admitted 8/27> Aspiration of brain abscess, admitted ICU   Consults:  PCCM  Neurosurgery   Procedures:  8/27> L frontal stereotactic biopsy/aspiration of L basal ganglia mass   Significant Diagnostic Tests:  8/25 MR Brain> 6.5x4.5x4 cm mass in the epicenter of the L basal ganglia and radiating white matter tracts 8/29 MR Brain, angio head> post op changes from drainage of L basal ganglia without complication. Lesion now 5.1x2.4x2.7cm. Persistent vasogenic edema throughout L cerebral hemisphere with mildly worse L to R shift, now 47mm. Interval multifocal posterior circulation infarcts. MRA without large vessel occlusion. Scattered vascular irregularity involving basilar and bilateral PCAs without occlusion.  CT head 8/31 >> slightly improved edema of L hemisphere, 2mm R shift, improved patency basal cisterns.   Micro Data:  8/26 BC x2> NGTD 8/27 Gram Stain> sent 8/27 Aerobic/Anaerobe> strep intermedius, pan sensitive  8/27 Fungus Culture with stain> Sent  8/27 Urine Culture<  <10k colonies    Antimicrobials:  8/27 Cefepime>> 8/27 8/27 Vanc>> 8/29 8/27 Metro>> 8/29 8/27 Ceftriaxone>>  Interim history/subjective:   After propofol was discontinued, patient opens eyes spontaneously but does not track or follow  commands  Objective   Blood pressure 119/70, pulse 64, temperature 99 F (37.2 C), temperature source Axillary, resp. rate 14, weight 69.5 kg, SpO2 100 %.    Vent Mode: PSV;CPAP FiO2 (%):  [40 %] 40 % Set Rate:  [12 bmp] 12 bmp Vt Set:  [490 mL] 490 mL PEEP:  [5 cmH20] 5 cmH20 Pressure Support:  [5 cmH20] 5 cmH20 Plateau Pressure:  [11 cmH20-12 cmH20] 11 cmH20   Intake/Output Summary (Last 24 hours) at 03/29/2020 1507 Last data filed at 03/29/2020 1400 Gross per 24 hour  Intake 1975.92 ml  Output 1150 ml  Net 825.92 ml   Filed Weights   03/25/20 0500 03/26/20 0500 03/28/20 0500  Weight: 69.6 kg 69.4 kg 69.5 kg   Examination: Physical Exam General: Chronically ill appearing, orally intubated, HEENT: cranial suture CDI, ETT, oropharynx is clear Neuro: Opens eyes spontaneously, does not track examiner, does not follow any command.  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  Acute toxic metabolic encephalopathy Bipolar Disorder Acute hypoxic respiratory failure requiring mechanical ventilation Hypertension Dyslipidemia Poorly controlled diabetes with hyperglycemia  Patient is off sedation completely Last CT head showed improvement in cerebral edema Neurosurgery recommend holding off hypertonic saline now and let serum sodium drift back to normal Continue Keppra for seizure prophylaxis Continue IV ceftriaxone Continue dexamethasone  Intermittently patient does tolerate pressure support trial but her mental status is poor to protect airway, she is not candidate for extubation Spoke with patient's family, they are in agreement for  tracheostomy on Monday with PEG placement VAP prevention order set Pulmonary hygiene Cleviprex is off, continueHydralazine and labetalol available prn, SBP goal 160 Started on low-dose amlodipine Increase Lantus to 18 units and sliding scale insulin   Best practice:  Diet: Tube feed Pain/Anxiety/Delirium protocol (if indicated): N/A DVT prophylaxis: Subq Heparin Glucose control: SSI   Mobility: Bed rest  Code Status: FULL  Family Communication: Spoke with patient's daughter at bedside Disposition: ICU  Labs   CBC: Recent Labs  Lab 03/24/20 0534 03/24/20 0534 03/25/20 0525 03/26/20 0531 03/27/20 0443 03/28/20 0409 03/29/20 0515  WBC 12.2*   < > 9.9 8.2 12.4* 13.5* 17.4*  NEUTROABS 9.9*  --  7.7 5.9 10.1*  --   --   HGB 10.3*   < > 9.7* 9.5* 9.8* 10.6* 11.2*  HCT 31.5*   < > 30.1* 29.8* 31.3* 34.2* 35.1*  MCV 90.0   < > 92.0 94.3 92.6 92.4 92.9  PLT 247   < > 228 197 224 232 230   < > = values in this interval not displayed.    Basic Metabolic Panel: Recent Labs  Lab 03/23/20 1048 03/23/20 1048 03/23/20 1732 03/23/20 2355 03/24/20 0534 03/24/20 1130 03/24/20 1727 03/24/20 2351 03/25/20 0525 03/25/20 1242 03/26/20 0531 03/26/20 0531 03/26/20 1217 03/26/20 1743 03/26/20 2311 03/27/20 0443 03/27/20 1800 03/28/20 0409 03/29/20 0515  NA 138   < > 144   < > 147*   < > 151*   < > 154*   < > 157*   < > 156*   < > 154* 152* 149* 144 140  K  --   --   --   --  2.9*  --   --   --  3.3*  --  3.4*  --   --   --   --  3.5  --  3.8 3.7  CL  --   --   --   --  118*  --   --   --  125*  --  126*  --   --   --   --  118*  --  109 105  CO2  --   --   --   --  21*  --   --   --  21*  --  23  --   --   --   --  26  --  26 27  GLUCOSE  --   --   --   --  182*  --   --   --  207*  --  236*  --   --   --   --  253*  --  247* 254*  BUN  --   --   --   --  13  --   --   --  19  --  23*  --   --   --   --  24*  --  23* 17  CREATININE  --   --   --   --  0.43*  --   --   --  0.49   --  0.40*  --   --   --   --  0.52  --  0.43* 0.41*  CALCIUM  --   --   --   --  8.0*  --   --   --  8.2*  --  8.2*  --   --   --   --  8.5*  --  8.7* 8.6*  MG 1.8   < > 2.1  --  2.0  --  2.2  --   --   --   --   --  2.3  --   --  2.1  --  2.2 2.1  PHOS 2.0*  --  2.3*  --  2.3*  --  1.7*  --   --   --   --   --   --   --   --   --   --   --  3.7   < > = values in this interval not displayed.   GFR: Estimated Creatinine Clearance: 77.3 mL/min (A) (by C-G formula based on SCr of 0.41 mg/dL (L)). Recent Labs  Lab 03/26/20 0531 03/27/20 0443 03/28/20 0409 03/29/20 0515  WBC 8.2 12.4* 13.5* 17.4*    Liver Function Tests: Recent Labs  Lab 03/24/20 0534 03/25/20 0525 03/26/20 0531 03/27/20 0443  AST 11* 15 15 24   ALT 12 20 29  64*  ALKPHOS 60 60 55 55  BILITOT 0.5 0.4 0.8 0.3  PROT 5.1* 4.8* 4.9* 5.1*  ALBUMIN 2.3* 2.3* 2.3* 2.5*   No results for input(s): LIPASE, AMYLASE in the last 168 hours. No results for input(s): AMMONIA in the last 168 hours.  ABG    Component Value Date/Time   PHART 7.458 (H) 03/23/2020 0525   PCO2ART 29.1 (L) 03/23/2020 0525   PO2ART 106 03/23/2020 0525   HCO3 20.6 03/23/2020 0525   TCO2 21 (L) 03/23/2020 0525   ACIDBASEDEF 2.0 03/23/2020 0525   O2SAT 98.0 03/23/2020 0525     Coagulation Profile: No results for input(s): INR, PROTIME in the last 168 hours.  Cardiac Enzymes: No results for input(s): CKTOTAL, CKMB, CKMBINDEX, TROPONINI in the last 168 hours.  HbA1C: Hgb A1c MFr Bld  Date/Time Value Ref Range Status  03/22/2020 04:55 PM 11.5 (H) 4.8 - 5.6 % Final    Comment:    (NOTE) Pre diabetes:          5.7%-6.4%  Diabetes:              >6.4%  Glycemic control for   <7.0% adults with diabetes     CBG: Recent Labs  Lab 03/28/20 1923 03/28/20 2316 03/29/20 0343 03/29/20 0748 03/29/20 1141  GLUCAP 225* 237* 220* 221* 201*   Total critical care time: 45 minutes  Performed by: 05/29/20   Critical care time was  exclusive of separately billable procedures and treating other patients.   Critical care was necessary to treat or prevent imminent or life-threatening deterioration.   Critical care was time spent personally by me on the following activities: development of treatment plan with patient and/or surrogate as well as nursing, discussions with consultants, evaluation of patient's response to treatment, examination of patient, obtaining history from patient or surrogate, ordering and performing treatments and interventions, ordering and review of laboratory studies, ordering and review of radiographic studies, pulse oximetry and re-evaluation of patient's condition.   05/29/20 MD Critical care physician [facility]  Critical Care  Pager: [phone] Mobile: [phone]

## 2020-03-29 NOTE — Plan of Care (Signed)
  Problem: Pain Managment: Goal: General experience of comfort will improve Outcome: Progressing   

## 2020-03-30 LAB — GLUCOSE, CAPILLARY
Glucose-Capillary: 241 mg/dL — ABNORMAL HIGH (ref 70–99)
Glucose-Capillary: 231 mg/dL — ABNORMAL HIGH (ref 70–99)
Glucose-Capillary: 265 mg/dL — ABNORMAL HIGH (ref 70–99)
Glucose-Capillary: 254 mg/dL — ABNORMAL HIGH (ref 70–99)
Glucose-Capillary: 234 mg/dL — ABNORMAL HIGH (ref 70–99)
Glucose-Capillary: 258 mg/dL — ABNORMAL HIGH (ref 70–99)

## 2020-03-30 MED ORDER — INSULIN ASPART 100 UNIT/ML ~~LOC~~ SOLN
3.0000 [IU] | SUBCUTANEOUS | Status: DC
  Administered 2020-03-30 – 2020-05-03 (×145): 3 [IU] via SUBCUTANEOUS

## 2020-03-30 NOTE — Progress Notes (Signed)
NAME:  Vickie Alvarado, MRN:  [id number], DOB:  [birth year], LOS: 10 ADMISSION DATE:  03/20/2020, CONSULTATION DATE:  03/22/20 REFERRING MD:  Dr. 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 S/p L frontal stereotactic biopsy/aspiration of the abscess by neurosurgery and PCCM consulted to admit.  Past Medical History  Anxiety  Bipolar Disorder Dental infection HTN HLD  Significant Hospital Events   8/25> Admitted 8/27> Aspiration of brain abscess, admitted ICU   Consults:  PCCM  Neurosurgery   Procedures:  8/27> L frontal stereotactic biopsy/aspiration of L basal ganglia mass   Significant Diagnostic Tests:  8/25 MR Brain> 6.5x4.5x4 cm mass in the epicenter of the L basal ganglia and radiating white matter tracts 8/29 MR Brain, angio head> post op changes from drainage of L basal ganglia without complication. Lesion now 5.1x2.4x2.7cm. Persistent vasogenic edema throughout L cerebral hemisphere with mildly worse L to R shift, now 54mm. Interval multifocal posterior circulation infarcts. MRA without large vessel occlusion. Scattered vascular irregularity involving basilar and bilateral PCAs without occlusion.  CT head 8/31 >> slightly improved edema of L hemisphere, 40mm R shift, improved patency basal cisterns.   Micro Data:  8/26 BC x2> NGTD 8/27 Gram Stain> sent 8/27 Aerobic/Anaerobe> strep intermedius, pan sensitive  8/27 Fungus Culture with stain> Sent  8/27 Urine Culture<  <10k colonies    Antimicrobials:  8/27 Cefepime>> 8/27 8/27 Vanc>> 8/29 8/27 Metro>> 8/29 8/27 Ceftriaxone>>  Interim history/subjective:  No new changes, opens eyes spontaneously but does not track  Objective   Blood pressure 109/67,  pulse (!) 59, temperature 99.1 F (37.3 C), temperature source Axillary, resp. rate 12, weight 68.3 kg, SpO2 100 %.    Vent Mode: PSV;CPAP FiO2 (%):  [40 %] 40 % Set Rate:  [12 bmp] 12 bmp Vt Set:  [490 mL] 490 mL PEEP:  [5 cmH20] 5 cmH20 Pressure Support:  [5 cmH20] 5 cmH20   Intake/Output Summary (Last 24 hours) at 03/30/2020 1127 Last data filed at 03/30/2020 0800 Gross per 24 hour  Intake 1760.78 ml  Output 350 ml  Net 1410.78 ml   Filed Weights   03/26/20 0500 03/28/20 0500 03/30/20 0500  Weight: 69.4 kg 69.5 kg 68.3 kg   Examination: Physical Exam General: Chronically ill appearing, orally intubated, HEENT: cranial suture CDI, ETT, oropharynx is clear Neuro: Opens eyes spontaneously, does not track examiner, disconjugate gaze does not follow any command.   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  Acute toxic/metabolic encephalopathy Bipolar Disorder Acute hypoxic respiratory failure requiring mechanical ventilation Hypertension Dyslipidemia Poorly controlled diabetes with hyperglycemia  Since patient is off sedation, she opens eyes spontaneously but does not follow commands She is off hypertonic saline with serum sodium goal of normal between 1 35-1 45 Continue Keppra for seizure prophylaxis Continue IV ceftriaxone for strep intermedius brain abscess Continue dexamethasone per neurosurgery, will taper it over the next week Intermittently  patient does tolerate pressure support trial but her mental status is poor to protect airway, she is not candidate for extubation Patient will require tracheostomy and PEG placement on Monday VAP prevention order  set Pulmonary hygiene Continue hydralazine and labetalol as needed with SBP goal <160 Continue amlodipine Fingerstick is still not well controlled, continue Lantus to 18 units and sliding scale insulin  Best practice:  Diet: Tube feed Pain/Anxiety/Delirium protocol (if indicated): N/A DVT prophylaxis: Subq Heparin Glucose control: SSI   Mobility: Bed rest  Code Status: FULL  Family Communication: Spoke with patient's daughter at bedside Disposition: ICU  Labs   CBC: Recent Labs  Lab 03/24/20 0534 03/24/20 0534 03/25/20 0525 03/26/20 0531 03/27/20 0443 03/28/20 0409 03/29/20 0515  WBC 12.2*   < > 9.9 8.2 12.4* 13.5* 17.4*  NEUTROABS 9.9*  --  7.7 5.9 10.1*  --   --   HGB 10.3*   < > 9.7* 9.5* 9.8* 10.6* 11.2*  HCT 31.5*   < > 30.1* 29.8* 31.3* 34.2* 35.1*  MCV 90.0   < > 92.0 94.3 92.6 92.4 92.9  PLT 247   < > 228 197 224 232 230   < > = values in this interval not displayed.    Basic Metabolic Panel: Recent Labs  Lab 03/23/20 1732 03/23/20 2355 03/24/20 0534 03/24/20 1130 03/24/20 1727 03/24/20 2351 03/25/20 0525 03/25/20 1242 03/26/20 0531 03/26/20 0531 03/26/20 1217 03/26/20 1743 03/26/20 2311 03/27/20 0443 03/27/20 1800 03/28/20 0409 03/29/20 0515  NA 144   < > 147*   < > 151*   < > 154*   < > 157*   < > 156*   < > 154* 152* 149* 144 140  K  --    < > 2.9*  --   --   --  3.3*  --  3.4*  --   --   --   --  3.5  --  3.8 3.7  CL  --    < > 118*  --   --   --  125*  --  126*  --   --   --   --  118*  --  109 105  CO2  --    < > 21*  --   --   --  21*  --  23  --   --   --   --  26  --  26 27  GLUCOSE  --    < > 182*  --   --   --  207*  --  236*  --   --   --   --  253*  --  247* 254*  BUN  --    < > 13  --   --   --  19  --  23*  --   --   --   --  24*  --  23* 17  CREATININE  --    < > 0.43*  --   --   --  0.49  --  0.40*  --   --   --   --  0.52  --  0.43* 0.41*  CALCIUM  --    < > 8.0*  --   --   --  8.2*  --  8.2*  --   --   --   --  8.5*  --  8.7*  8.6*  MG 2.1  --  2.0  --  2.2  --   --   --   --   --  2.3  --   --  2.1  --  2.2 2.1  PHOS 2.3*  --  2.3*  --  1.7*  --   --   --   --   --   --   --   --   --   --   --  3.7   < > = values in this interval not displayed.   GFR: Estimated Creatinine Clearance: 77.3 mL/min (A) (by C-G formula based on SCr of 0.41 mg/dL (L)). Recent Labs  Lab 03/26/20 0531 03/27/20 0443 03/28/20 0409 03/29/20 0515  WBC 8.2 12.4* 13.5* 17.4*    Liver Function Tests: Recent Labs  Lab 03/24/20 0534 03/25/20 0525 03/26/20 0531 03/27/20 0443  AST 11* 15 15 24   ALT 12 20 29  64*  ALKPHOS 60 60 55 55  BILITOT 0.5 0.4 0.8 0.3  PROT 5.1* 4.8* 4.9* 5.1*  ALBUMIN 2.3* 2.3* 2.3* 2.5*   No results for input(s): LIPASE, AMYLASE in the last 168 hours. No results for input(s): AMMONIA in the last 168 hours.  ABG    Component Value Date/Time   PHART 7.458 (H) 03/23/2020 0525   PCO2ART 29.1 (L) 03/23/2020 0525   PO2ART 106 03/23/2020 0525   HCO3 20.6 03/23/2020 0525   TCO2 21 (L) 03/23/2020 0525   ACIDBASEDEF 2.0 03/23/2020 0525   O2SAT 98.0 03/23/2020 0525     Coagulation Profile: No results for input(s): INR, PROTIME in the last 168 hours.  Cardiac Enzymes: No results for input(s): CKTOTAL, CKMB, CKMBINDEX, TROPONINI in the last 168 hours.  HbA1C: Hgb A1c MFr Bld  Date/Time Value Ref Range Status  03/22/2020 04:55 PM 11.5 (H) 4.8 - 5.6 % Final    Comment:    (NOTE) Pre diabetes:          5.7%-6.4%  Diabetes:              >6.4%  Glycemic control for   <7.0% adults with diabetes     CBG: Recent Labs  Lab 03/29/20 1605 03/29/20 1943 03/29/20 2317 03/30/20 0313 03/30/20 0828  GLUCAP 202* 259* 259* 241* 231*   Total critical care time: 39 minutes  Performed by: 05/30/20   Critical care time was exclusive of separately billable procedures and treating other patients.   Critical care was necessary to treat or prevent imminent or life-threatening deterioration.    Critical care was time spent personally by me on the following activities: development of treatment plan with patient and/or surrogate as well as nursing, discussions with consultants, evaluation of patient's response to treatment, examination of patient, obtaining history from patient or surrogate, ordering and performing treatments and interventions, ordering and review of laboratory studies, ordering and review of radiographic studies, pulse oximetry and re-evaluation of patient's condition.   05/30/20 MD Critical care physician [facility] [place] Critical [facility]  Pager: [phone] Mobile: [phone]

## 2020-03-30 NOTE — Progress Notes (Signed)
eLink Physician-Brief Progress Note Patient Name: Vickie Alvarado DOB: 12/23/1965 MRN: [id number]   Date of Service  03/30/2020  HPI/Events of Note  Patient is hyperglycemic, she needs coverage insulin for tube feeds.  eICU Interventions  Coverage insulin for tube feeding ordered.        Thomasene Lot Acelyn Basham 03/30/2020, 11:09 PM

## 2020-03-30 NOTE — Progress Notes (Signed)
   Providing Compassionate, Quality Care - Together   Subjective: Nurse reports no issues overnight.  Objective: Vital signs in last 24 hours: Temp:  [98.7 F (37.1 C)-99.1 F (37.3 C)] 99.1 F (37.3 C) (09/04 0800) Pulse Rate:  [59-78] 59 (09/04 0800) Resp:  [12-17] 12 (09/04 0800) BP: (101-138)/(62-80) 109/67 (09/04 0800) SpO2:  [97 %-100 %] 100 % (09/04 0800) FiO2 (%):  [40 %] 40 % (09/04 0830) Weight:  [68.3 kg] 68.3 kg (09/04 0500)  Intake/Output from previous day: 09/03 0701 - 09/04 0700 In: 2740.8 [NG/GT:2210; IV Piggyback:530.8] Out: 350 [Urine:350] Intake/Output this shift: Total I/O In: 130 [NG/GT:130] Out: -   Responds to voice PERRLA Face symmetric Withdraws to painful stimuli, BUE, BLE Incision is clean, dry, and intact   Lab Results: Recent Labs    03/28/20 0409 03/29/20 0515  WBC 13.5* 17.4*  HGB 10.6* 11.2*  HCT 34.2* 35.1*  PLT 232 230   BMET Recent Labs    03/28/20 0409 03/29/20 0515  NA 144 140  K 3.8 3.7  CL 109 105  CO2 26 27  GLUCOSE 247* 254*  BUN 23* 17  CREATININE 0.43* 0.41*  CALCIUM 8.7* 8.6*    Studies/Results: No results found.  Assessment/Plan: Patient underwent left frontal stereotactic biopsy/aspiration of abscess on 03/22/2020 by Dr. Jake Samples. Hypertonic saline off since 9/1.   LOS: 10 days    -Continue supportive care -Plan for trach/PEG on Monday -antibiotics per ID   Val Eagle, DNP, AGNP-C Nurse Practitioner  [facility] [address], [address], [place], [place] [address] P: [phone]    F: [phone]  03/30/2020, 9:07 AM

## 2020-03-31 LAB — BASIC METABOLIC PANEL
Anion gap: 10 (ref 5–15)
BUN: 17 mg/dL (ref 6–20)
CO2: 25 mmol/L (ref 22–32)
Calcium: 8.5 mg/dL — ABNORMAL LOW (ref 8.9–10.3)
Chloride: 103 mmol/L (ref 98–111)
Creatinine, Ser: 0.4 mg/dL — ABNORMAL LOW (ref 0.44–1.00)
GFR calc Af Amer: >60 mL/min (ref >60)
GFR calc non Af Amer: >60 mL/min (ref >60)
Glucose, Bld: 245 mg/dL — ABNORMAL HIGH (ref 70–99)
Potassium: 3.8 mmol/L (ref 3.5–5.1)
Sodium: 138 mmol/L (ref 135–145)

## 2020-03-31 LAB — CBC
HCT: 35.9 % — ABNORMAL LOW (ref 36.0–46.0)
Hemoglobin: 11.6 g/dL — ABNORMAL LOW (ref 12.0–15.0)
MCH: 29.7 pg (ref 26.0–34.0)
MCHC: 32.3 g/dL (ref 30.0–36.0)
MCV: 91.8 fL (ref 80.0–100.0)
Platelets: 237 10*3/uL (ref 150–400)
RBC: 3.91 MIL/uL (ref 3.87–5.11)
RDW: 15.9 % — ABNORMAL HIGH (ref 11.5–15.5)
WBC: 19.4 10*3/uL — ABNORMAL HIGH (ref 4.0–10.5)
nRBC: 0 % (ref 0.0–0.2)

## 2020-03-31 LAB — GLUCOSE, CAPILLARY
Glucose-Capillary: 230 mg/dL — ABNORMAL HIGH (ref 70–99)
Glucose-Capillary: 182 mg/dL — ABNORMAL HIGH (ref 70–99)
Glucose-Capillary: 211 mg/dL — ABNORMAL HIGH (ref 70–99)
Glucose-Capillary: 242 mg/dL — ABNORMAL HIGH (ref 70–99)
Glucose-Capillary: 241 mg/dL — ABNORMAL HIGH (ref 70–99)
Glucose-Capillary: 212 mg/dL — ABNORMAL HIGH (ref 70–99)

## 2020-03-31 MED ORDER — DEXAMETHASONE SODIUM PHOSPHATE 10 MG/ML IJ SOLN
8.0000 mg | Freq: Four times a day (QID) | INTRAMUSCULAR | Status: DC
  Administered 2020-03-31 – 2020-04-01 (×4): 8 mg via INTRAVENOUS
  Filled 2020-03-31 (×4): qty 1

## 2020-03-31 MED ORDER — INSULIN GLARGINE 100 UNIT/ML ~~LOC~~ SOLN
20.0000 [IU] | Freq: Every day | SUBCUTANEOUS | Status: DC
  Administered 2020-03-31 – 2020-05-03 (×34): 20 [IU] via SUBCUTANEOUS
  Filled 2020-03-31 (×35): qty 0.2

## 2020-03-31 NOTE — Progress Notes (Signed)
NAME:  Vickie Alvarado, MRN:  [id number], DOB:  1966/07/13, LOS: 11 ADMISSION DATE:  03/20/2020, CONSULTATION DATE:  03/22/20 REFERRING MD:  Dr. 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 S/p L frontal stereotactic biopsy/aspiration of the abscess by neurosurgery and PCCM consulted to admit.  Past Medical History  Anxiety  Bipolar Disorder Dental infection HTN HLD  Significant Hospital Events   8/25> Admitted 8/27> Aspiration of brain abscess, admitted ICU   Consults:  PCCM  Neurosurgery   Procedures:  8/27> L frontal stereotactic biopsy/aspiration of L basal ganglia mass   Significant Diagnostic Tests:  8/25 MR Brain> 6.5x4.5x4 cm mass in the epicenter of the L basal ganglia and radiating white matter tracts 8/29 MR Brain, angio head> post op changes from drainage of L basal ganglia without complication. Lesion now 5.1x2.4x2.7cm. Persistent vasogenic edema throughout L cerebral hemisphere with mildly worse L to R shift, now 46mm. Interval multifocal posterior circulation infarcts. MRA without large vessel occlusion. Scattered vascular irregularity involving basilar and bilateral PCAs without occlusion.  CT head 8/31 >> slightly improved edema of L hemisphere, 34mm R shift, improved patency basal cisterns.   Micro Data:  8/26 BC x2> NGTD 8/27 Gram Stain> sent 8/27 Aerobic/Anaerobe> strep intermedius, pan sensitive  8/27 Fungus Culture with stain> Sent  8/27 Urine Culture<  <10k colonies    Antimicrobials:  8/27 Cefepime>> 8/27 8/27 Vanc>> 8/29 8/27 Metro>> 8/29 8/27 Ceftriaxone>>  Interim history/subjective:  Patient has been tolerating pressure support trial, remained afebrile  Objective   Blood pressure  139/81, pulse 82, temperature 98.8 F (37.1 C), temperature source Axillary, resp. rate 12, weight 67.3 kg, SpO2 100 %.    Vent Mode: PRVC FiO2 (%):  [30 %] 30 % Set Rate:  [12 bmp] 12 bmp Vt Set:  [490 mL] 490 mL PEEP:  [5 cmH20] 5 cmH20 Pressure Support:  [5 cmH20] 5 cmH20 Plateau Pressure:  [13 cmH20-15 cmH20] 15 cmH20   Intake/Output Summary (Last 24 hours) at 03/31/2020 1320 Last data filed at 03/31/2020 1200 Gross per 24 hour  Intake 2228.48 ml  Output 1700 ml  Net 528.48 ml   Filed Weights   03/28/20 0500 03/30/20 0500 03/31/20 0500  Weight: 69.5 kg 68.3 kg 67.3 kg   Physical Exam General: Chronically ill appearing, orally intubated, HEENT: cranial suture in place,, ETT, oropharynx is clear Neuro: Opens eyes spontaneously, does not track examiner, disconjugate gaze does not follow any command.  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  Acute toxic/metabolic encephalopathy Bipolar Disorder Acute hypoxic respiratory failure requiring mechanical ventilation Hypertension Dyslipidemia Poorly controlled diabetes with hyperglycemia  There is no change in her neurological exam, opens eyes spontaneously Sodium goal is normal between 1 35-1 44 Continue Keppra for seizure prophylaxis Continue IV ceftriaxone for strep intermedius brain abscess Dexamethasone  dose was decreased from 10 mg to 8 mg every 6 hours per discussion with neurosurgery Patient is tolerating pressure support trial but her mental status is poor to protect airway, she is not  candidate for extubation Patient will require tracheostomy and PEG placement on Monday VAP prevention order set Pulmonary hygiene Continue hydralazine and labetalol as needed with SBP goal <160 Continue amlodipine Fingerstick is still not well controlled, increase Lantus to 20 units and continue sliding scale insulin  Best practice:  Diet: Tube feed Pain/Anxiety/Delirium protocol (if indicated): N/A DVT prophylaxis: Subq Heparin Glucose control: SSI   Mobility: Bed rest  Code Status: FULL  Family Communication: Will update family Disposition: ICU  Labs   CBC: Recent Labs  Lab 03/25/20 0525 03/25/20 0525 03/26/20 0531 03/27/20 0443 03/28/20 0409 03/29/20 0515 03/31/20 1016  WBC 9.9   < > 8.2 12.4* 13.5* 17.4* 19.4*  NEUTROABS 7.7  --  5.9 10.1*  --   --   --   HGB 9.7*   < > 9.5* 9.8* 10.6* 11.2* 11.6*  HCT 30.1*   < > 29.8* 31.3* 34.2* 35.1* 35.9*  MCV 92.0   < > 94.3 92.6 92.4 92.9 91.8  PLT 228   < > 197 224 232 230 237   < > = values in this interval not displayed.    Basic Metabolic Panel: Recent Labs  Lab 03/24/20 1727 03/24/20 2351 03/26/20 0531 03/26/20 0531 03/26/20 1217 03/26/20 1743 03/27/20 0443 03/27/20 1800 03/28/20 0409 03/29/20 0515 03/31/20 0500  NA 151*   < > 157*   < > 156*   < > 152* 149* 144 140 138  K  --    < > 3.4*  --   --   --  3.5  --  3.8 3.7 3.8  CL  --    < > 126*  --   --   --  118*  --  109 105 103  CO2  --    < > 23  --   --   --  26  --  26 27 25   GLUCOSE  --    < > 236*  --   --   --  253*  --  247* 254* 245*  BUN  --    < > 23*  --   --   --  24*  --  23* 17 17  CREATININE  --    < > 0.40*  --   --   --  0.52  --  0.43* 0.41* 0.40*  CALCIUM  --    < > 8.2*  --   --   --  8.5*  --  8.7* 8.6* 8.5*  MG 2.2  --   --   --  2.3  --  2.1  --  2.2 2.1  --   PHOS 1.7*  --   --   --   --   --   --   --   --  3.7  --    < > = values in this interval not displayed.   GFR: Estimated Creatinine Clearance: 77.3 mL/min (A) (by C-G  formula based on SCr of 0.4 mg/dL (L)). Recent Labs  Lab 03/27/20 0443 03/28/20 0409 03/29/20 0515 03/31/20 1016  WBC 12.4* 13.5* 17.4* 19.4*    Liver Function Tests: Recent Labs  Lab 03/25/20 0525 03/26/20 0531 03/27/20 0443  AST 15 15 24   ALT 20 29 64*  ALKPHOS 60 55 55  BILITOT 0.4 0.8 0.3  PROT 4.8* 4.9* 5.1*  ALBUMIN 2.3* 2.3* 2.5*   No results for input(s): LIPASE, AMYLASE in the last 168 hours. No results for input(s): AMMONIA in the last 168 hours.  ABG    Component Value Date/Time   PHART 7.458 (H) 03/23/2020 0525   PCO2ART 29.1 (L) 03/23/2020 0525   PO2ART 106 03/23/2020 0525   HCO3 20.6 03/23/2020 0525   TCO2 21 (L) 03/23/2020 0525   ACIDBASEDEF 2.0 03/23/2020 0525   O2SAT 98.0 03/23/2020 0525     Coagulation Profile: No results for input(s): INR, PROTIME in the last 168 hours.  Cardiac Enzymes: No results for input(s): CKTOTAL, CKMB, CKMBINDEX, TROPONINI in the last 168 hours.  HbA1C: Hgb A1c MFr Bld  Date/Time Value Ref Range Status  03/22/2020 04:55 PM 11.5 (H) 4.8 - 5.6 % Final    Comment:    (NOTE) Pre diabetes:          5.7%-6.4%  Diabetes:              >6.4%  Glycemic control for   <7.0% adults with diabetes     CBG: Recent Labs  Lab 03/30/20 1930 03/30/20 2314 03/31/20 0312 03/31/20 0721 03/31/20 1111  GLUCAP 234* 258* 230* 182* 211*   Total critical care time: 33 minutes  Performed by: Cheri Fowler   Critical care time was exclusive of separately billable procedures and treating other patients.   Critical care was necessary to treat or prevent imminent or life-threatening deterioration.   Critical care was time spent personally by me on the following activities: development of treatment plan with patient and/or surrogate as well as nursing, discussions with consultants, evaluation of patient's response to treatment, examination of patient, obtaining history from patient or surrogate, ordering and performing treatments  and interventions, ordering and review of laboratory studies, ordering and review of radiographic studies, pulse oximetry and re-evaluation of patient's condition.   Cheri Fowler MD Critical care physician [facility]  Pager: [phone] Mobile: [phone]

## 2020-03-31 NOTE — Progress Notes (Signed)
° °  Providing Compassionate, Quality Care - Together  NEUROSURGERY PROGRESS NOTE   S: No issues overnight. Labs pending  O: EXAM:  BP 118/72    Pulse 78    Temp 98.8 F (37.1 C) (Axillary)    Resp (!) 24    Wt 67.3 kg    SpO2 100%    BMI 23.24 kg/m   Intubated Eyes opento pain Looks around room PERRL Face symmetric BUE/BLE WD to pain Incision c/d/i  ASSESSMENT:  54 y.o. female with  1. Left frontal abscess with mass effect 2. 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 Intermedius -keep off HTS, monitor Na, goal now normonatremia - keppra - decadron, wean off - continue supportive care - dvt ppx w SQH - guarded prognosis - trach/peg Monday planned    Thank you for allowing me to participate in this patient's care.  Please do not hesitate to call with questions or concerns.   Monia Pouch, DO Neurosurgeon [facility] Cell: [phone]

## 2020-04-01 LAB — CBC WITH DIFFERENTIAL/PLATELET
Abs Immature Granulocytes: 0.21 10*3/uL — ABNORMAL HIGH (ref 0.00–0.07)
Basophils Absolute: 0 10*3/uL (ref 0.0–0.1)
Basophils Relative: 0 %
Eosinophils Absolute: 0 10*3/uL (ref 0.0–0.5)
Eosinophils Relative: 0 %
HCT: 35.1 % — ABNORMAL LOW (ref 36.0–46.0)
Hemoglobin: 11.2 g/dL — ABNORMAL LOW (ref 12.0–15.0)
Immature Granulocytes: 1 %
Lymphocytes Relative: 9 %
Lymphs Abs: 1.6 10*3/uL (ref 0.7–4.0)
MCH: 29.4 pg (ref 26.0–34.0)
MCHC: 31.9 g/dL (ref 30.0–36.0)
MCV: 92.1 fL (ref 80.0–100.0)
Monocytes Absolute: 1.3 10*3/uL — ABNORMAL HIGH (ref 0.1–1.0)
Monocytes Relative: 7 %
Neutro Abs: 14.7 10*3/uL — ABNORMAL HIGH (ref 1.7–7.7)
Neutrophils Relative %: 83 %
Platelets: 232 10*3/uL (ref 150–400)
RBC: 3.81 MIL/uL — ABNORMAL LOW (ref 3.87–5.11)
RDW: 16.1 % — ABNORMAL HIGH (ref 11.5–15.5)
WBC: 17.8 10*3/uL — ABNORMAL HIGH (ref 4.0–10.5)
nRBC: 0 % (ref 0.0–0.2)

## 2020-04-01 LAB — GLUCOSE, CAPILLARY
Glucose-Capillary: 162 mg/dL — ABNORMAL HIGH (ref 70–99)
Glucose-Capillary: 122 mg/dL — ABNORMAL HIGH (ref 70–99)
Glucose-Capillary: 123 mg/dL — ABNORMAL HIGH (ref 70–99)
Glucose-Capillary: 140 mg/dL — ABNORMAL HIGH (ref 70–99)
Glucose-Capillary: 162 mg/dL — ABNORMAL HIGH (ref 70–99)
Glucose-Capillary: 141 mg/dL — ABNORMAL HIGH (ref 70–99)

## 2020-04-01 LAB — BASIC METABOLIC PANEL
Anion gap: 9 (ref 5–15)
BUN: 16 mg/dL (ref 6–20)
CO2: 27 mmol/L (ref 22–32)
Calcium: 8.4 mg/dL — ABNORMAL LOW (ref 8.9–10.3)
Chloride: 103 mmol/L (ref 98–111)
Creatinine, Ser: 0.35 mg/dL — ABNORMAL LOW (ref 0.44–1.00)
GFR calc Af Amer: >60 mL/min (ref >60)
GFR calc non Af Amer: >60 mL/min (ref >60)
Glucose, Bld: 144 mg/dL — ABNORMAL HIGH (ref 70–99)
Potassium: 3.8 mmol/L (ref 3.5–5.1)
Sodium: 139 mmol/L (ref 135–145)

## 2020-04-01 MED ORDER — ETOMIDATE 2 MG/ML IV SOLN
INTRAVENOUS | Status: AC
  Administered 2020-04-01: 20 mg
  Filled 2020-04-01: qty 10

## 2020-04-01 MED ORDER — VECURONIUM BROMIDE 10 MG IV SOLR
10.0000 mg | Freq: Once | INTRAVENOUS | Status: AC
  Administered 2020-04-01: 10 mg via INTRAVENOUS
  Filled 2020-04-01: qty 10

## 2020-04-01 MED ORDER — FENTANYL CITRATE (PF) 100 MCG/2ML IJ SOLN
100.0000 ug | Freq: Once | INTRAMUSCULAR | Status: AC
  Administered 2020-04-01: 100 ug via INTRAVENOUS
  Filled 2020-04-01: qty 2

## 2020-04-01 MED ORDER — CEFAZOLIN SODIUM-DEXTROSE 1-4 GM/50ML-% IV SOLN
1.0000 g | INTRAVENOUS | Status: AC
  Filled 2020-04-01: qty 50

## 2020-04-01 MED ORDER — MIDAZOLAM HCL 2 MG/2ML IJ SOLN
4.0000 mg | Freq: Once | INTRAMUSCULAR | Status: AC
  Administered 2020-04-01: 2 mg via INTRAVENOUS

## 2020-04-01 MED ORDER — PROPOFOL 10 MG/ML IV BOLUS: 100.0000 ug | Freq: Once | INTRAVENOUS | Status: DC

## 2020-04-01 MED ORDER — DEXTROSE IN LACTATED RINGERS 5 % IV SOLN: INTRAVENOUS | Status: DC

## 2020-04-01 MED ORDER — PROPOFOL 10 MG/ML IV BOLUS
100.0000 mg | Freq: Once | INTRAVENOUS | Status: AC
  Administered 2020-04-01: 40 mg via INTRAVENOUS
  Filled 2020-04-01: qty 20

## 2020-04-01 MED ORDER — MIDAZOLAM HCL 2 MG/2ML IJ SOLN
INTRAMUSCULAR | Status: AC
  Administered 2020-04-01: 2 mg
  Filled 2020-04-01: qty 4

## 2020-04-01 MED ORDER — DEXAMETHASONE SODIUM PHOSPHATE 10 MG/ML IJ SOLN
6.0000 mg | Freq: Four times a day (QID) | INTRAMUSCULAR | Status: DC
  Administered 2020-04-01 – 2020-04-02 (×4): 6 mg via INTRAVENOUS
  Filled 2020-04-01 (×4): qty 1

## 2020-04-01 NOTE — Consult Note (Signed)
Chief Complaint: Dysphagia - ongoing nutritional source. Request is for gastrostomy tube placement  Referring Physician(s): Dr. Jeanella Craze  Supervising Physician: Jacqulynn Cadet  Patient Status: [facility] - In-pt  History of Present Illness: Vickie Alvarado is a 54 y.o. female History of HTN, HLD, COVID (8.6.21). Presented to the ED at [facility] with right sided weakness, dysarthria and right sided facial droop patient was found to have a left basal ganglia. Team is requesting gastrostomy tube placement for ongoing nutrition source.  Past Medical History:  Diagnosis Date  . Anxiety   . Bipolar disorder ([place])   . Dental caries    periodontal disease, exostosis left maxilla  . GERD (gastroesophageal reflux disease)   . Hypercholesterolemia   . Hypertension   . Seasonal allergies   . Wears dentures   . Wears glasses     Past Surgical History:  Procedure Laterality Date  . APPLICATION OF CRANIAL NAVIGATION N/A 03/22/2020   Procedure: APPLICATION OF CRANIAL NAVIGATION;  Surgeon: Dawley, Theodoro Doing, DO;  Location: [facility];  Service: Neurosurgery;  Laterality: N/A;  APPLICATION OF CRANIAL NAVIGATION  . COLONOSCOPY    . DENTAL SURGERY     teeth extractions  . FRAMELESS  BIOPSY WITH BRAINLAB Left 03/22/2020   Procedure: FRAMELESS BIOPSY WITH Lucky Rathke;  Surgeon: Dawley, Theodoro Doing, DO;  Location: [place];  Service: Neurosurgery;  Laterality: Left;  FRAMELESS BIOPSY WITH BRAINLAB  . TUBAL LIGATION    . UTERINE FIBROID SURGERY      Allergies: Ibuprofen  Medications: Prior to Admission medications   Medication Sig Start Date End Date Taking? Authorizing Provider  acetaminophen (TYLENOL) 500 MG tablet Take 1,000 mg by mouth every 6 (six) hours as needed for moderate pain or headache.   Yes [provider]  benztropine (COGENTIN) 1 MG tablet Take 1 mg by mouth at bedtime.  01/30/20  Yes [provider]  gabapentin (NEURONTIN) 400 MG capsule Take 400 mg by mouth 3 (three) times daily.    Yes [provider]  omeprazole (PRILOSEC) 20 MG capsule Take 20 mg by mouth daily.   Yes [provider]  QUEtiapine (SEROQUEL) 25 MG tablet Take 25 mg by mouth at bedtime. Take with 800 mg to equal 825 mg at bedtime 01/30/20  Yes [provider]  QUEtiapine (SEROQUEL) 400 MG tablet Take 800 mg by mouth at bedtime. Take with the 25 mg to equal to 825 at bedtime 01/30/20  Yes [provider]  amoxicillin (AMOXIL) 500 MG capsule Take 500 mg by mouth daily.  Patient not taking: Reported on 03/20/2020    [provider]  cyclobenzaprine (FLEXERIL) 10 MG tablet Take 1 tablet (10 mg total) by mouth 2 (two) times daily as needed for muscle spasms. Patient not taking: Reported on 02/22/2020 09/25/19   Albrizze, Harley Hallmark, PA-C     History reviewed. No pertinent family history.  Social History   Socioeconomic History  . Marital status: Married    Spouse name: Not on file  . Number of children: Not on file  . Years of education: Not on file  . Highest education level: Not on file  Occupational History  . Not on file  Tobacco Use  . Smoking status: Current Every Day Smoker    Packs/day: 0.25    Types: Cigarettes  . Smokeless tobacco: Never Used  Vaping Use  . Vaping Use: Never used  Substance and Sexual Activity  . Alcohol use: Never  . Drug use: Never  .  Sexual activity: Yes    Birth control/protection: None  Other Topics Concern  . Not on file  Social History Narrative  . Not on file   Social Determinants of Health   Financial Resource Strain:   . Difficulty of Paying Living Expenses: Not on file  Food Insecurity:   . Worried About Programme researcher, broadcasting/film/videounning Out of Food in the Last Year: Not on file  . Ran Out of Food in the Last Year: Not on file  Transportation Needs:   . Lack of Transportation (Medical): Not on file  . Lack of Transportation (Non-Medical): Not on file  Physical Activity:   . Days of Exercise per Week: Not on file  . Minutes of Exercise  per Session: Not on file  Stress:   . Feeling of Stress : Not on file  Social Connections:   . Frequency of Communication with Friends and Family: Not on file  . Frequency of Social Gatherings with Friends and Family: Not on file  . Attends Religious Services: Not on file  . Active Member of Clubs or Organizations: Not on file  . Attends BankerClub or Organization Meetings: Not on file  . Marital Status: Not on file    ECOG Status:  Review of Systems: A 12 point ROS discussed and pertinent positives are indicated in the HPI above.  All other systems are negative.  Review of Systems  Unable to perform ROS: Acuity of condition    Vital Signs: BP 107/65   Pulse 61   Temp 98.1 F (36.7 C) (Oral)   Resp 13   Wt 148 lb 2.4 oz (67.2 kg)   SpO2 100%   BMI 23.20 kg/m   Physical Exam Vitals and nursing note reviewed.  Constitutional:      Appearance: She is well-developed.  HENT:     Head: Normocephalic and atraumatic.  Eyes:     Conjunctiva/sclera: Conjunctivae normal.  Cardiovascular:     Rate and Rhythm: Normal rate and regular rhythm.     Heart sounds: Normal heart sounds.  Pulmonary:     Effort: Pulmonary effort is normal.     Comments: Vent sounds noted.  Musculoskeletal:        General: Normal range of motion.     Cervical back: Normal range of motion.  Skin:    General: Skin is warm.  Neurological:     Mental Status: She is alert and oriented to person, place, and time.     Imaging: CT Angio Head W/Cm &/Or Wo Cm  Result Date: 03/20/2020 CLINICAL DATA:  Acute neuro deficit with right-sided weakness. EXAM: CT ANGIOGRAPHY HEAD AND NECK TECHNIQUE: Multidetector CT imaging of the head and neck was performed using the standard protocol during bolus administration of intravenous contrast. Multiplanar CT image reconstructions and MIPs were obtained to evaluate the vascular anatomy. Carotid stenosis measurements (when applicable) are obtained utilizing NASCET criteria, using  the distal internal carotid diameter as the denominator. CONTRAST:  50mL OMNIPAQUE IOHEXOL 350 MG/ML SOLN COMPARISON:  None. FINDINGS: CT HEAD FINDINGS Brain: Large mass lesion in the left frontal lobe. Delayed imaging reveals irregular enhancement of the periphery with central necrosis. The enhancing component of the mass measures approximately 53 x 28 mm. Surrounding low-density edema/tumor seen around the enhancement. Low-density extends into the left basal ganglia. There is compression of the left lateral ventricle and 7 mm midline shift to the right. No acute hemorrhage. Negative for hydrocephalus Vascular: Negative for hyperdense vessel Skull: Negative Sinuses: Complete opacification right maxillary sinus  with bony thickening. Mucosal edema extends into the right frontal and right ethmoid sinus. Remaining sinuses clear. Mastoid clear bilaterally. Orbits: Negative Review of the MIP images confirms the above findings CTA NECK FINDINGS Aortic arch: Normal aortic arch. Azygos branching pattern. Proximal great vessels widely patent. Right carotid system: Right carotid widely patent. Mild noncalcified plaque in the right carotid bulb. Left carotid system: Left carotid widely patent. No significant stenosis or atherosclerotic disease. Vertebral arteries: Both vertebral arteries are widely patent to the basilar without stenosis. Skeleton: Disc degeneration and spurring C5-6 and C6-7. No acute skeletal abnormality. Poor dentition. Other neck: Negative for mass or adenopathy. Upper chest: Lung apices clear bilaterally Review of the MIP images confirms the above findings CTA HEAD FINDINGS Anterior circulation: Mild atherosclerotic calcification in the cavernous carotid without stenosis. Right middle cerebral artery widely patent. Hypoplastic right A1 segment. Azygos anterior cerebral artery arising from the left without significant stenosis. There is displacement and stretching of the left MCA branches due to the large  left frontal mass. Posterior circulation: Both vertebral arteries patent to the basilar. PICA patent bilaterally. Basilar widely patent. AICA, superior cerebellar, and posterior cerebral arteries patent bilaterally without stenosis. Venous sinuses: Normal venous enhancement Anatomic variants: None Review of the MIP images confirms the above findings IMPRESSION: 1. Large mass lesion left frontal lobe compatible with tumor. Favor glioblastoma. 2. Negative for acute infarct or hemorrhage 3. No significant carotid or vertebral artery stenosis in the neck. 4. Negative for intracranial large vessel occlusion. Electronically Signed   By: Franchot Gallo M.D.   On: 03/20/2020 13:29   CT HEAD WO CONTRAST  Result Date: 03/26/2020 CLINICAL DATA:  Cerebral edema.  Status post biopsy. EXAM: CT HEAD WITHOUT CONTRAST TECHNIQUE: Contiguous axial images were obtained from the base of the skull through the vertex without intravenous contrast. COMPARISON:  None. FINDINGS: Brain: Decreased pneumocephalus at the left frontal biopsy site. The degree of edema throughout the left hemisphere has improved slightly. But, there is a new left posterior cerebral artery territory infarct. Rightward midline shift measures 6 mm at the level of the foramina of [place]. There is improved patency of the basal cisterns. No hydrocephalus. Vascular: No hyperdense vessel or unexpected calcification. Skull: Normal. Negative for fracture or focal lesion. Sinuses/Orbits: Complete opacification of the right frontal and maxillary sinuses. Normal orbits. Other: None IMPRESSION: 1. Slightly improved edema throughout the left hemisphere with 6 mm rightward midline shift. Improved patency of the basal cisterns. 2. Left posterior cerebral artery territory infarct. Electronically Signed   By: Ulyses Jarred M.D.   On: 03/26/2020 01:51   CT HEAD WO CONTRAST  Result Date: 03/22/2020 CLINICAL DATA:  Mental status change. Recent diagnosis of brain lesion post  biopsy. EXAM: CT HEAD WITHOUT CONTRAST TECHNIQUE: Contiguous axial images were obtained from the base of the skull through the vertex without intravenous contrast. COMPARISON:  CT 8 hours ago.  Brain MRI earlier today. FINDINGS: Brain: Stable size and appearance of the left brain lesion centered in the basal ganglia. Stable internal air or Gel-Foam. Stable regional mass effect. Left-to-right midline shift of 9 mm, previously 11 mm. No evidence of lesional or acute hemorrhage. Stable sulcal effacement. Lacunar infarct in the right thalamus better appreciated on prior MRI. Stable ventricular size. Basilar cisterns are patent. No developing subdural collection. Vascular: No hyperdense vessel. Skull: Left frontal burr hole.  No fracture or focal lesion. Sinuses/Orbits: Unchanged appearance of right maxillary and frontal sinusitis with opacification of anterior ethmoid air cells. Other:  Post biopsy changes in the left frontal scalp. IMPRESSION: 1. Stable size and appearance of the left brain lesion centered in the basal ganglia with internal air or Gel-Foam. Stable regional mass effect. Slightly diminished left-to-right midline shift of 9 mm, previously 11 mm. 2. No evidence of hemorrhage or new abnormality. Electronically Signed   By: Narda Rutherford M.D.   On: 03/22/2020 23:33   CT HEAD WO CONTRAST  Result Date: 03/22/2020 CLINICAL DATA:  Follow-up brain biopsy. EXAM: CT HEAD WITHOUT CONTRAST TECHNIQUE: Contiguous axial images were obtained from the base of the skull through the vertex without intravenous contrast. COMPARISON:  MRI earlier same day.  CT yesterday. FINDINGS: Brain: Left frontal burr hole. Biopsy site with air or Gel-Foam in the region of the previous tumor with the epicenter in the left basal ganglia. No evidence of postsurgical hemorrhage. No change in regional mass effect with left-to-right shift 11 mm. No extra-axial collection. Vascular: No abnormal vascular finding. Skull: Otherwise negative  Sinuses/Orbits: Ostiomeatal unit pattern of the paranasal sinuses on the right. Other: None IMPRESSION: Biopsy site with air or Gel-Foam in the region of the previous tumor with the epicenter in the left basal ganglia. No evidence of postsurgical hemorrhage. No change in regional mass effect with left-to-right shift 11 mm. Electronically Signed   By: Paulina Fusi M.D.   On: 03/22/2020 15:58   CT HEAD WO CONTRAST  Result Date: 03/21/2020 CLINICAL DATA:  Brain mass. EXAM: CT HEAD WITHOUT CONTRAST TECHNIQUE: Contiguous axial images were obtained from the base of the skull through the vertex without intravenous contrast. COMPARISON:  Brain MRI from yesterday FINDINGS: Brain: Known mass with central low-density and extensive hemispheric low-density and swelling centered in the deep left cerebrum. Dimensions were better assessed on postcontrast imaging from yesterday. No interval hemorrhage or herniation. Midline shift measures 1 cm without entrapment. Vascular: Negative Skull: Negative Sinuses/Orbits: Chronic right-sided sinusitis with obstruction at the level of the middle meatus. Other: Motion artifact IMPRESSION: 1. BrainLAB protocol but motion artifact at the vertex and skull base due to confusion. If not adequate for intraoperative localization a sedated scan may be required. 2. No new findings when compared to preceding brain MRI. Electronically Signed   By: Marnee Spring M.D.   On: 03/21/2020 11:32   CT Angio Neck W and/or Wo Contrast  Result Date: 03/20/2020 CLINICAL DATA:  Acute neuro deficit with right-sided weakness. EXAM: CT ANGIOGRAPHY HEAD AND NECK TECHNIQUE: Multidetector CT imaging of the head and neck was performed using the standard protocol during bolus administration of intravenous contrast. Multiplanar CT image reconstructions and MIPs were obtained to evaluate the vascular anatomy. Carotid stenosis measurements (when applicable) are obtained utilizing NASCET criteria, using the distal  internal carotid diameter as the denominator. CONTRAST:  62mL OMNIPAQUE IOHEXOL 350 MG/ML SOLN COMPARISON:  None. FINDINGS: CT HEAD FINDINGS Brain: Large mass lesion in the left frontal lobe. Delayed imaging reveals irregular enhancement of the periphery with central necrosis. The enhancing component of the mass measures approximately 53 x 28 mm. Surrounding low-density edema/tumor seen around the enhancement. Low-density extends into the left basal ganglia. There is compression of the left lateral ventricle and 7 mm midline shift to the right. No acute hemorrhage. Negative for hydrocephalus Vascular: Negative for hyperdense vessel Skull: Negative Sinuses: Complete opacification right maxillary sinus with bony thickening. Mucosal edema extends into the right frontal and right ethmoid sinus. Remaining sinuses clear. Mastoid clear bilaterally. Orbits: Negative Review of the MIP images confirms the above findings CTA  NECK FINDINGS Aortic arch: Normal aortic arch. Azygos branching pattern. Proximal great vessels widely patent. Right carotid system: Right carotid widely patent. Mild noncalcified plaque in the right carotid bulb. Left carotid system: Left carotid widely patent. No significant stenosis or atherosclerotic disease. Vertebral arteries: Both vertebral arteries are widely patent to the basilar without stenosis. Skeleton: Disc degeneration and spurring C5-6 and C6-7. No acute skeletal abnormality. Poor dentition. Other neck: Negative for mass or adenopathy. Upper chest: Lung apices clear bilaterally Review of the MIP images confirms the above findings CTA HEAD FINDINGS Anterior circulation: Mild atherosclerotic calcification in the cavernous carotid without stenosis. Right middle cerebral artery widely patent. Hypoplastic right A1 segment. Azygos anterior cerebral artery arising from the left without significant stenosis. There is displacement and stretching of the left MCA branches due to the large left frontal  mass. Posterior circulation: Both vertebral arteries patent to the basilar. PICA patent bilaterally. Basilar widely patent. AICA, superior cerebellar, and posterior cerebral arteries patent bilaterally without stenosis. Venous sinuses: Normal venous enhancement Anatomic variants: None Review of the MIP images confirms the above findings IMPRESSION: 1. Large mass lesion left frontal lobe compatible with tumor. Favor glioblastoma. 2. Negative for acute infarct or hemorrhage 3. No significant carotid or vertebral artery stenosis in the neck. 4. Negative for intracranial large vessel occlusion. Electronically Signed   By: Marlan Palau M.D.   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 EXAM: MRI HEAD WITHOUT AND WITH CONTRAST MRA HEAD WITHOUT CONTRAST TECHNIQUE: Multiplanar, multiecho pulse sequences of the brain and surrounding structures were obtained without and with intravenous contrast. Angiographic images of the head were obtained using MRA technique without contrast. CONTRAST:  58mL GADAVIST GADOBUTROL 1 MMOL/ML IV SOLN COMPARISON:  Prior MRI from 03/22/2020 and 03/20/2020. FINDINGS: MRI HEAD FINDINGS Brain: Postoperative changes from interval stereotactic aspiration of previously identified lesion centered at the left basal ganglia is seen. Aspiration tract with a small amount of susceptibility artifacts seen extending via a left frontal approach. The lesion at the left basal ganglia is slightly decreased in size now measuring 5.1 x 2.4 x 2.7 cm, previously 6.0 x 2.4 x 3.5 cm. Associated internal fluid-fluid level with restricted diffusion. Small focus of pneumocephalus at the nondependent portion of this lesion. Per history, this lesion was found to be most consistent with an intracerebral infection/abscess. Persistent surrounding vasogenic edema throughout the  adjacent left cerebral hemisphere with regional mass effect and effacement of the left lateral ventricle. Edema extends into the left cerebral peduncle and left midbrain, similar to previous. Associated left-to-right shift has worsened now measuring up to 10 mm, previously 6 mm. No overt hydrocephalus or ventricular trapping at this time. Crowding of the basilar cisterns with mass effect on the adjacent midbrain, similar to slightly worsened. No transtentorial herniation. 1 cm right thalamic infarct is similar to previous. No associated hemorrhage. Interval development of confluent restricted diffusion throughout the left temporal occipital region, left PCA distribution, consistent with acute ischemia. No associated hemorrhage. Multiple additional new patchy ischemic infarcts involving the bilateral thalami, splenium, parasagittal right temporal occipital region, and left dorsal pons. Right thalamic ischemic changes extend into the right midbrain. These infarcts involve the posterior circulation. An additional curvilinear infarct extending from the right lentiform nucleus towards the right caudate noted as well, favored to be anterior distribution (series 6, image 76). Patchy restricted diffusion also seen in the  region of the left hypothalamus (series 6, image 69). Additional small cortical infarct noted at the posterior right frontotemporal region (series 6, image 80). Associated mild petechial hemorrhage at the left dorsal pons without hemorrhagic transformation. Findings favored to be embolic in nature. Mild left a meningeal enhancement noted involving the parasagittal left occipital region (series 29, image 7). While this finding could be reactive in nature related to adjacent left PCA territory infarct, a degree of underlying left a meningeal meningitis related to infection could also be considered. Pituitary stalk is thickened and enhancing as well. Vascular: Major intracranial vascular flow voids are  maintained. Normal opacification seen throughout the major dural sinuses following contrast administration. Skull and upper cervical spine: Craniocervical junction within normal limits. No transtentorial herniation. Bone marrow signal intensity normal. No scalp soft tissue abnormality. Sinuses/Orbits: Globes and orbital soft tissues within normal limits. Extensive paranasal sinus disease involving the right frontoethmoidal and right maxillary sinuses noted. Fluid seen within the nasopharynx. Small bilateral mastoid effusions. Patient is intubated. Other: None. MRA HEAD FINDINGS ANTERIOR CIRCULATION: Examination degraded by motion artifact. Visualized distal cervical segments of the internal carotid arteries are patent with symmetric antegrade flow. Petrous, cavernous, and supraclinoid ICAs patent without stenosis or other abnormality. Left A1 patent. Right A1 hypoplastic and/or absent, accounting for the diminutive right ICA is compared to the left. Widely patent azygos ACA. No M1 stenosis or occlusion. Normal MCA bifurcations. Distal MCA branches well perfused and symmetric. POSTERIOR CIRCULATION: Vertebral arteries patent to the vertebrobasilar junction without stenosis. Right PICA patent. Left PICA not seen. Basilar mildly irregular but patent to its distal aspect without high-grade stenosis. Superior cerebral arteries patent bilaterally. Both PCAs primarily supplied via the basilar. Irregularity about the PCAs bilaterally with associated mild to moderate stenoses, greater on the left. Specific note made of a moderate left P2 stenosis (series 28, image 14) PCAs remain well perfused to their distal aspects. No aneurysm. IMPRESSION: MRI HEAD IMPRESSION: 1. Postoperative changes from stereotactic aspiration/drainage of left basal ganglia lesion without complication. Lesion is decreased in size now measuring 5.1 x 2.4 x 2.7 cm. Per history, this is consistent with and intracerebral abscess. 2. Persistent associated  vasogenic edema throughout the left cerebral hemisphere with mildly worsened 10 mm left-to-right shift. 3. Interval development of multifocal predominantly posterior circulation infarcts, including a large left PCA territory infarct. Minimal petechial hemorrhage at the left pons without hemorrhagic transformation. A central thromboembolic source is suspected. 4. Patchy leptomeningeal enhancement within the posterior left occipital region. While this may be reactive to the adjacent left PCA territory infarct, a degree of leptomeningeal meningitis related to intracerebral infection could also be considered. 5. Extensive right-sided paranasal sinus disease. MRA HEAD IMPRESSION: 1. Negative intracranial MRA for large vessel occlusion. 2. Scattered vascular irregularity involving the basilar and bilateral PCAs without occlusion. No proximal high-grade or correctable stenosis. No aneurysm. Findings discussed by called by telephone on 03/24/2020 at approximately 5:30 am with provider Dr. Wilford Corner. Electronically Signed   By: Rise Mu M.D.   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 EXAM: MRI HEAD WITHOUT AND WITH CONTRAST MRA HEAD WITHOUT CONTRAST TECHNIQUE: Multiplanar, multiecho pulse sequences of the brain and surrounding structures were obtained without and with intravenous contrast. Angiographic images of the head were obtained using MRA technique without contrast. CONTRAST:  6mL GADAVIST GADOBUTROL 1 MMOL/ML IV SOLN COMPARISON:  Prior MRI  from 03/22/2020 and 03/20/2020. FINDINGS: MRI HEAD FINDINGS Brain: Postoperative changes from interval stereotactic aspiration of previously identified lesion centered at the left basal ganglia is seen. Aspiration tract with a small amount of susceptibility artifacts seen extending via a left frontal approach. The lesion at the left  basal ganglia is slightly decreased in size now measuring 5.1 x 2.4 x 2.7 cm, previously 6.0 x 2.4 x 3.5 cm. Associated internal fluid-fluid level with restricted diffusion. Small focus of pneumocephalus at the nondependent portion of this lesion. Per history, this lesion was found to be most consistent with an intracerebral infection/abscess. Persistent surrounding vasogenic edema throughout the adjacent left cerebral hemisphere with regional mass effect and effacement of the left lateral ventricle. Edema extends into the left cerebral peduncle and left midbrain, similar to previous. Associated left-to-right shift has worsened now measuring up to 10 mm, previously 6 mm. No overt hydrocephalus or ventricular trapping at this time. Crowding of the basilar cisterns with mass effect on the adjacent midbrain, similar to slightly worsened. No transtentorial herniation. 1 cm right thalamic infarct is similar to previous. No associated hemorrhage. Interval development of confluent restricted diffusion throughout the left temporal occipital region, left PCA distribution, consistent with acute ischemia. No associated hemorrhage. Multiple additional new patchy ischemic infarcts involving the bilateral thalami, splenium, parasagittal right temporal occipital region, and left dorsal pons. Right thalamic ischemic changes extend into the right midbrain. These infarcts involve the posterior circulation. An additional curvilinear infarct extending from the right lentiform nucleus towards the right caudate noted as well, favored to be anterior distribution (series 6, image 76). Patchy restricted diffusion also seen in the region of the left hypothalamus (series 6, image 69). Additional small cortical infarct noted at the posterior right frontotemporal region (series 6, image 80). Associated mild petechial hemorrhage at the left dorsal pons without hemorrhagic transformation. Findings favored to be embolic in nature. Mild left a  meningeal enhancement noted involving the parasagittal left occipital region (series 29, image 7). While this finding could be reactive in nature related to adjacent left PCA territory infarct, a degree of underlying left a meningeal meningitis related to infection could also be considered. Pituitary stalk is thickened and enhancing as well. Vascular: Major intracranial vascular flow voids are maintained. Normal opacification seen throughout the major dural sinuses following contrast administration. Skull and upper cervical spine: Craniocervical junction within normal limits. No transtentorial herniation. Bone marrow signal intensity normal. No scalp soft tissue abnormality. Sinuses/Orbits: Globes and orbital soft tissues within normal limits. Extensive paranasal sinus disease involving the right frontoethmoidal and right maxillary sinuses noted. Fluid seen within the nasopharynx. Small bilateral mastoid effusions. Patient is intubated. Other: None. MRA HEAD FINDINGS ANTERIOR CIRCULATION: Examination degraded by motion artifact. Visualized distal cervical segments of the internal carotid arteries are patent with symmetric antegrade flow. Petrous, cavernous, and supraclinoid ICAs patent without stenosis or other abnormality. Left A1 patent. Right A1 hypoplastic and/or absent, accounting for the diminutive right ICA is compared to the left. Widely patent azygos ACA. No M1 stenosis or occlusion. Normal MCA bifurcations. Distal MCA branches well perfused and symmetric. POSTERIOR CIRCULATION: Vertebral arteries patent to the vertebrobasilar junction without stenosis. Right PICA patent. Left PICA not seen. Basilar mildly irregular but patent to its distal aspect without high-grade stenosis. Superior cerebral arteries patent bilaterally. Both PCAs primarily supplied via the basilar. Irregularity about the PCAs bilaterally with associated mild to moderate stenoses, greater on the left. Specific note made of a moderate left  P2 stenosis (series 28,  image 14) PCAs remain well perfused to their distal aspects. No aneurysm. IMPRESSION: MRI HEAD IMPRESSION: 1. Postoperative changes from stereotactic aspiration/drainage of left basal ganglia lesion without complication. Lesion is decreased in size now measuring 5.1 x 2.4 x 2.7 cm. Per history, this is consistent with and intracerebral abscess. 2. Persistent associated vasogenic edema throughout the left cerebral hemisphere with mildly worsened 10 mm left-to-right shift. 3. Interval development of multifocal predominantly posterior circulation infarcts, including a large left PCA territory infarct. Minimal petechial hemorrhage at the left pons without hemorrhagic transformation. A central thromboembolic source is suspected. 4. Patchy leptomeningeal enhancement within the posterior left occipital region. While this may be reactive to the adjacent left PCA territory infarct, a degree of leptomeningeal meningitis related to intracerebral infection could also be considered. 5. Extensive right-sided paranasal sinus disease. MRA HEAD IMPRESSION: 1. Negative intracranial MRA for large vessel occlusion. 2. Scattered vascular irregularity involving the basilar and bilateral PCAs without occlusion. No proximal high-grade or correctable stenosis. No aneurysm. Findings discussed by called by telephone on 03/24/2020 at approximately 5:30 am with provider Dr. Wilford Corner. Electronically Signed   By: Rise Mu M.D.   On: 03/24/2020 06:44   MR BRAIN W WO CONTRAST  Result Date: 03/22/2020 CLINICAL DATA:  Brain mass EXAM: MRI HEAD WITHOUT AND WITH CONTRAST TECHNIQUE: Multiplanar, multiecho pulse sequences of the brain and surrounding structures were obtained without and with intravenous contrast. CONTRAST:  8mL GADAVIST GADOBUTROL 1 MMOL/ML IV SOLN COMPARISON:  03/20/2020 FINDINGS: Brain: When accounting for differences in technique (axial scan angle is slightly different), similar size of the mass  centered within the left basal ganglia, with the enhancing portion measuring up to approximately 6.4 x 2.8 by 3.6 cm. Redemonstrated nodular peripheral enhancement with central necrosis. Similar exuberant surrounding T2/stir hyperintensity with marked effacement left lateral ventricle and approximately 9 mm of rightward midline shift at the foramina [place]. No progressive ventriculomegaly. The mass restricts diffusion peripherally without central restricted diffusion. Additionally, there is a new area of restricted diffusion within the right thalamus, compatible with acute infarct (series 550, image 28). Mild associated T2/STIR hyperintensity. Vascular: Limited evaluation; however, major vessels at the base of the brain demonstrate normal flow voids. Skull and upper cervical spine: Negative Sinuses/Orbits: There is complete opacification of the right frontal, anterior ethmoid air cells, and right maxillary sinus, compatible with ostiomeatal unit pattern sinus disease. Other: Small left mastoid effusion. IMPRESSION: 1. New/acute infarct within the right thalamus. 2. 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 Resulting effacement of the left lateral ventricle and approximately 9 mm of right midline shift, not substantially changed. No progressive ventriculomegaly. 3. Similar right-sided ostiomeatal unit pattern paranasal sinusitis. Critical Value/emergent results were called by telephone at the time of interpretation on 03/22/2020 at 10:35 am to provider Dr. Jake Samples, who verbally acknowledged these results. Electronically Signed   By: Feliberto Harts MD   On: 03/22/2020 10:39   MR Brain W and Wo Contrast  Result Date: 03/20/2020 CLINICAL DATA:  Acute right-sided weakness. Brain tumor shown by CT angiography.  EXAM: MRI HEAD WITHOUT AND WITH CONTRAST TECHNIQUE: Multiplanar, multiecho pulse sequences of the brain and surrounding structures were obtained without and with intravenous contrast. CONTRAST:  8.31mL GADAVIST GADOBUTROL 1 MMOL/ML IV SOLN COMPARISON:  CT studies earlier same day FINDINGS: Brain: Approximately 6.5 X 4.5 x 4 cm  tumor with the epicenter in the left basal ganglia and radiating white matter tracts region. Central necrosis. Contrast enhancement along the margins of the necrosis. The enhancing region measures 6 x 2.4 x 3.5 cm. There is mass effect with flattening of the left lateral ventricle. Left-to-right midline shift of 6 mm. No ventricular trapping. No satellite lesion identified. No second brain mass. No evidence of ischemic stroke. Vascular: Major vessels at the base of the brain show flow. Skull and upper cervical spine: Negative Sinuses/Orbits: Ostiomeatal unit pattern on the right with opacification of the right maxillary, ethmoid and frontal sinuses. Orbits negative. Other: None IMPRESSION: 6.5 x 4.5 x 4 cm tumor with the epicenter in the left basal ganglia and radiating white matter tracts. Central necrosis. Contrast enhancement along the margins of the necrosis. The enhancing region measures 6 x 2.4 x 3.5 cm. There is mass effect with flattening of the left lateral ventricle. Left-to-right midline shift of 6 mm. No ventricular trapping. No satellite lesion identified. 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 Right-sided paranasal sinusitis with ostiomeatal unit pattern. Electronically Signed   By: Nelson Chimes M.D.   On: 03/20/2020 15:44   CT CHEST ABDOMEN PELVIS W CONTRAST  Result Date: 03/20/2020 CLINICAL DATA:  54 year old female with brain mass. Evaluate for metastatic disease. EXAM: CT CHEST, ABDOMEN, AND PELVIS WITH CONTRAST TECHNIQUE: Multidetector CT imaging of the chest, abdomen and pelvis was performed following the standard protocol during  bolus administration of intravenous contrast. CONTRAST:  133mL OMNIPAQUE IOHEXOL 350 MG/ML SOLN COMPARISON:  None. FINDINGS: CT CHEST FINDINGS Cardiovascular: There is no cardiomegaly or pericardial effusion. There is noncalcified plaque along the thoracic aorta and aortic arch. No aneurysmal dilatation or dissection. The origins of the great vessels of the aortic arch appear patent. The central pulmonary arteries are unremarkable for the degree of opacification. Mediastinum/Nodes: Top-normal right hilar lymph nodes. No adenopathy. The esophagus and the thyroid gland are grossly unremarkable. No mediastinal fluid collection. Lungs/Pleura: There is a patchy area of consolidation with air bronchograms involving the right lower lobe which may represent atelectasis but concerning for pneumonia. Aspiration is not excluded. Clinical correlation and follow-up to resolution recommended to exclude an underlying mass. Faint left lung base linear and platelike atelectasis noted. There is no pleural effusion or pneumothorax. The central airways are patent. Musculoskeletal: No acute osseous pathology. CT ABDOMEN PELVIS FINDINGS No intra-abdominal free air. There is a small free fluid in the pelvis. Hepatobiliary: The liver is unremarkable. No intrahepatic biliary ductal dilatation. The gallbladder is unremarkable. Pancreas: Unremarkable. No pancreatic ductal dilatation or surrounding inflammatory changes. Spleen: Normal in size without focal abnormality. Adrenals/Urinary Tract: The adrenal glands are unremarkable. There is no hydronephrosis on either side. There is symmetric enhancement and excretion of contrast by both kidneys. The visualized ureters and urinary bladder appear unremarkable. Stomach/Bowel: There is moderate stool throughout the colon. There is no bowel obstruction or active inflammation. The appendix is normal. Vascular/Lymphatic: The abdominal aorta and IVC are unremarkable. No portal venous gas. There is no  adenopathy. Reproductive: Hysterectomy. There is a 9 mm calcific focus in the right posterior pelvis which is not evaluated but may be associated with the ovary. Pelvic ultrasound may provide better evaluation. Other: Anterior pelvic wall surgical scar.  No fluid collection. Musculoskeletal: Degenerative changes of the spine. No acute osseous pathology. IMPRESSION: 1. Right lower lobe consolidation concerning for pneumonia. Aspiration is not excluded. Clinical correlation and follow-up to resolution recommended to exclude an underlying mass. 2.  No evidence of metastatic disease in the chest, abdomen or pelvis. 3. An indeterminate 9 mm calcific focus in the right posterior pelvis may be associated with the ovary. Pelvic ultrasound may provide better evaluation. 4. Aortic Atherosclerosis (ICD10-I70.0). Electronically Signed   By: Elgie Collard M.D.   On: 03/20/2020 19:52   DG Chest Port 1 View  Result Date: 03/28/2020 CLINICAL DATA:  Respiratory failure.  Hypoxia.  COVID positive. EXAM: PORTABLE CHEST 1 VIEW COMPARISON:  03/27/2020. FINDINGS: Endotracheal tube and NG tube in stable position. Right PICC line stable position. Heart size stable. Low lung volumes with persistent bibasilar atelectasis/infiltrates. Interim improvement in aeration from prior exam. No prominent pleural effusion. No pneumothorax. IMPRESSION: 1.  Lines and tubes stable position. 2. Low lung volumes with persistent bibasilar atelectasis/infiltrates. Interim improvement in aeration from prior exam. Electronically Signed   By: Maisie Fus  Register   On: 03/28/2020 06:14   DG CHEST PORT 1 VIEW  Result Date: 03/27/2020 CLINICAL DATA:  Respiratory failure. EXAM: PORTABLE CHEST 1 VIEW COMPARISON:  03/23/2020 FINDINGS: 0523 hours. Endotracheal tube tip has pulled back in the interval and is now approximately 6.3 cm above the base of the carina. The NG tube passes into the stomach although the distal tip position is not included on the film.  Interval improvement in basilar aeration with some persistent/residual streaky opacity in the bases, likely atelectasis. No pulmonary edema or pleural effusion. The visualized bony structures of the thorax show no acute abnormality. Telemetry leads overlie the chest. IMPRESSION: 1. Interval repositioning of the endotracheal tube. 2. Interval improvement in basilar aeration with some residual probable atelectasis at the lung bases. Electronically Signed   By: Kennith Center M.D.   On: 03/27/2020 07:25   Portable Chest x-ray  Result Date: 03/23/2020 CLINICAL DATA:  54 year old female status post intubation. EXAM: PORTABLE CHEST 1 VIEW COMPARISON:  Chest CT dated 03/20/2020. FINDINGS: Endotracheal tube with tip at the level of the carina tilting towards the right mainstem bronchus. Recommend retraction by approximately 4 cm for optimal positioning. Enteric tube extends below the diaphragm with tip beyond the inferior margin of the image. Bilateral streaky and hazy airspace densities may represent atelectasis or pneumonia. No large pleural effusion. No pneumothorax. The cardiac silhouette is within limits. No acute osseous pathology. IMPRESSION: 1. Endotracheal tube with tip at the level of the carina tilting towards the right mainstem bronchus. Recommend retraction by 4 cm for optimal positioning. 2. Bilateral streaky and hazy airspace densities may represent atelectasis or pneumonia. These results were called by telephone at the time of interpretation on 03/23/2020 at 12:57 am to nurse Charlynn Court, who verbally acknowledged these results. Electronically Signed   By: Elgie Collard M.D.   On: 03/23/2020 01:03   [place] EKG SITE RITE  Result Date: 03/23/2020 If [facility] image not attached, placement could not be confirmed due to current cardiac rhythm.   Labs:  CBC: Recent Labs    03/28/20 0409 03/29/20 0515 03/31/20 1016 04/01/20 0620  WBC 13.5* 17.4* 19.4* 17.8*  HGB 10.6* 11.2* 11.6* 11.2*  HCT  34.2* 35.1* 35.9* 35.1*  PLT 232 230 237 232    COAGS: Recent Labs    03/20/20 1011  INR 1.2  APTT 30    BMP: Recent Labs    03/28/20 0409 03/29/20 0515 03/31/20 0500 04/01/20 0620  NA 144 140 138 139  K 3.8 3.7 3.8 3.8  CL 109 105 103 103  CO2 26 27 25 27   GLUCOSE 247* 254*  245* 144*  BUN 23* CALCIUM 8.7* 8.6* 8.5* 8.4*  CREATININE 0.43* 0.41* 0.40* 0.35*  GFRNONAA >60 >60 >60 >60  GFRAA >60 >60 >60 >60    LIVER FUNCTION TESTS: Recent Labs    03/24/20 0534 03/25/20 0525 03/26/20 0531 03/27/20 0443  BILITOT 0.5 0.4 0.8 0.3  AST 11* ALT 64*  ALKPHOS 60 60 55 55  PROT 5.1* 4.8* 4.9* 5.1*  ALBUMIN 2.3* 2.3* 2.3* 2.5*     Assessment and Plan:  54 y.o, female inpatient. History of HTN, HLD, COVID (8.6.21). Presented to the ED at [facility] with right sided weakness, dysarthria and right sided facial droop patient was found to have a left basal ganglia. Team is requesting gastrostomy tube placement for ongoing nutrition source. Patient is currently intubated. Patient is scheduled trached placement on  9.6.21  CT CAP from 8.25.21 shows the stomach in an accessible position. No pertinent allergies. WBC 17.8 ( patient is on decadron), Cr 0.35.  All other labs and medications are within acceptable parameters. Patient is afebrile.  IR consulted for possible gastrostomy tube placement. Case has been reviewed and procedure approved by Dr. Archer Asa.  Patient tentatively scheduled for 9.8.21.  Team instructed to: Keep Patient to be NPO after midnight Hold prophylactic anticoagulation 24 hours prior to scheduled procedure.   IR will call patient when ready.  Risks and benefits image guided gastrostomy tube placement was discussed with the patient and patient's daughters including, but not limited to the need for a barium enema during the procedure, bleeding, infection, peritonitis and/or damage to adjacent structures.  All of the daughters  questions were answered, patient is agreeable to proceed.  Consent signed and in APP room.      Thank you for this interesting consult.  I greatly enjoyed meeting Kade Rickels and look forward to participating in their care.  A copy of this report was sent to the requesting provider on this date.  Electronically Signed: Alene Mires, NP 04/01/2020, 11:01 AM   I spent a total of 40 Minutes    in face to face in clinical consultation, greater than 50% of which was counseling/coordinating care for gastrostomy tube placement

## 2020-04-01 NOTE — Progress Notes (Signed)
NAME:  Vickie Alvarado, MRN:  [id number], DOB:  1966/06/03, LOS: 12 ADMISSION DATE:  03/20/2020, CONSULTATION DATE:  03/22/20 REFERRING MD:  Dr. 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 S/p L frontal stereotactic biopsy/aspiration of the abscess by neurosurgery and PCCM consulted to admit.  Past Medical History  Anxiety  Bipolar Disorder Dental infection HTN HLD  Significant Hospital Events   8/25> Admitted 8/27> Aspiration of brain abscess. Transferred to ICU   Consults:  PCCM  Neurosurgery   Procedures:  8/27> L frontal stereotactic biopsy/aspiration of L basal ganglia mass   Significant Diagnostic Tests:  8/25 MR Brain> 6.5x4.5x4 cm mass in the epicenter of the L basal ganglia and radiating white matter tracts 8/29 MR Brain, angio head> post op changes from drainage of L basal ganglia without complication. Lesion now 5.1x2.4x2.7cm. Persistent vasogenic edema throughout L cerebral hemisphere with mildly worse L to R shift, now 61mm. Interval multifocal posterior circulation infarcts. MRA without large vessel occlusion. Scattered vascular irregularity involving basilar and bilateral PCAs without occlusion.  CT head 8/31 >> slightly improved edema of L hemisphere, 81mm R shift, improved patency basal cisterns.   Micro Data:  8/26 BC x2> NGTD 8/27 Gram Stain> sent 8/27 Aerobic/Anaerobe> strep intermedius, pan sensitive  8/27 Fungus Culture with stain> Sent  8/27 Urine Culture<  <10k colonies    Antimicrobials:  8/27 Cefepime>> 8/27 8/27 Vanc>> 8/29 8/27 Metro>> 8/29 8/27 Ceftriaxone>>  Interim history/subjective:  No overnight issues, patient remain n.p.o. for bedside percutaneous tracheostomy  today  Objective   Blood pressure 108/65, pulse 66, temperature 98.1 F (36.7 C), temperature source Oral, resp. rate 12, weight 67.2 kg, SpO2 100 %.    Vent Mode: PRVC FiO2 (%):  [30 %] 30 % Set Rate:  [12 bmp] 12 bmp Vt Set:  [490 mL] 490 mL PEEP:  [5 cmH20] 5 cmH20 Plateau Pressure:  [13 cmH20-18 cmH20] 14 cmH20   Intake/Output Summary (Last 24 hours) at 04/01/2020 0951 Last data filed at 04/01/2020 0932 Gross per 24 hour  Intake 1279 ml  Output 1550 ml  Net -271 ml   Filed Weights   03/30/20 0500 03/31/20 0500 04/01/20 0500  Weight: 68.3 kg 67.3 kg 67.2 kg   Physical Exam General: Chronically ill appearing, orally intubated, HEENT: cranial suture in place,, ETT, oropharynx is clear Neuro: Opens eyes spontaneously, does not track examiner, disconjugate gaze does not follow any command.  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  Acute toxic/metabolic encephalopathy Bipolar Disorder Acute hypoxic respiratory failure requiring mechanical ventilation Hypertension Dyslipidemia Poorly controlled diabetes with hyperglycemia  There has not been a major change in neurological exams except patient opens eyes spontaneously now Sodium goal is normal between 1 35-1 44 Continue Keppra for seizure prophylaxis She is on IV ceftriaxone for strep intermedius  brain abscess per discussion with infectious disease, still unclear the duration of therapy Dexamethasone dose was further decreased from 8 mg to 6 mg every 6 hours per discussion with  neurosurgery Patient is scheduled for tracheostomy today She will get PEG placement by IR tomorrow VAP prevention order set Pulmonary hygiene Continue hydralazine and labetalol as needed with SBP goal <160 Continue amlodipine Patient blood sugar is better controlled now within the goal of 140-180, continue Lantus to 20 units and continue sliding scale insulin  Best practice:  Diet: Tube feed Pain/Anxiety/Delirium protocol (if indicated): N/A DVT prophylaxis: Subq Heparin Glucose control: SSI   Mobility: Bed rest  Code Status: FULL  Family Communication: Patient's daughter was updated at bedside Disposition: ICU  Labs   CBC: Recent Labs  Lab 03/26/20 0531 03/26/20 0531 03/27/20 0443 03/28/20 0409 03/29/20 0515 03/31/20 1016 04/01/20 0620  WBC 8.2   < > 12.4* 13.5* 17.4* 19.4* 17.8*  NEUTROABS 5.9  --  10.1*  --   --   --  14.7*  HGB 9.5*   < > 9.8* 10.6* 11.2* 11.6* 11.2*  HCT 29.8*   < > 31.3* 34.2* 35.1* 35.9* 35.1*  MCV 94.3   < > 92.6 92.4 92.9 91.8 92.1  PLT 197   < > 224 232 230 237 232   < > = values in this interval not displayed.    Basic Metabolic Panel: Recent Labs  Lab 03/26/20 1217 03/26/20 1743 03/27/20 0443 03/27/20 0443 03/27/20 1800 03/28/20 0409 03/29/20 0515 03/31/20 0500 04/01/20 0620  NA 156*   < > 152*   < > 149* 144 140 138 139  K  --   --  3.5  --   --  3.8 3.7 3.8 3.8  CL  --   --  118*  --   --  109 105 103 103  CO2  --   --  26  --   --  26 27 25 27   GLUCOSE  --   --  253*  --   --  247* 254* 245* 144*  BUN  --   --  24*  --   --  23* 17 17 16   CREATININE  --   --  0.52  --   --  0.43* 0.41* 0.40* 0.35*  CALCIUM  --   --  8.5*  --   --  8.7* 8.6* 8.5* 8.4*  MG 2.3  --  2.1  --   --  2.2 2.1  --   --   PHOS  --   --   --   --   --   --  3.7  --   --    < > = values in this interval not displayed.   GFR: Estimated Creatinine Clearance: 77.3 mL/min (A) (by C-G formula based on SCr of 0.35 mg/dL (L)). Recent Labs  Lab  03/28/20 0409 03/29/20 0515 03/31/20 1016 04/01/20 0620  WBC 13.5* 17.4* 19.4* 17.8*    Liver Function Tests: Recent Labs  Lab 03/26/20 0531 03/27/20 0443  AST 15 24  ALT 29 64*  ALKPHOS 55 55  BILITOT 0.8 0.3  PROT 4.9* 5.1*  ALBUMIN 2.3* 2.5*   No results for input(s): LIPASE, AMYLASE in the last 168 hours. No results for input(s): AMMONIA in the last 168 hours.  ABG    Component Value Date/Time   PHART 7.458 (H) 03/23/2020 0525   PCO2ART 29.1 (L) 03/23/2020 0525   PO2ART 106 03/23/2020 0525  HCO3 20.6 03/23/2020 0525   TCO2 21 (L) 03/23/2020 0525   ACIDBASEDEF 2.0 03/23/2020 0525   O2SAT 98.0 03/23/2020 0525     Coagulation Profile: No results for input(s): INR, PROTIME in the last 168 hours.  Cardiac Enzymes: No results for input(s): CKTOTAL, CKMB, CKMBINDEX, TROPONINI in the last 168 hours.  HbA1C: Hgb A1c MFr Bld  Date/Time Value Ref Range Status  03/22/2020 04:55 PM 11.5 (H) 4.8 - 5.6 % Final    Comment:    (NOTE) Pre diabetes:          5.7%-6.4%  Diabetes:              >6.4%  Glycemic control for   <7.0% adults with diabetes     CBG: Recent Labs  Lab 03/31/20 1539 03/31/20 1925 03/31/20 2323 04/01/20 0314 04/01/20 0733  GLUCAP 242* 241* 212* 162* 122*   Total critical care time: 31 minutes  Performed by: Cheri Fowler   Critical care time was exclusive of separately billable procedures and treating other patients.   Critical care was necessary to treat or prevent imminent or life-threatening deterioration.   Critical care was time spent personally by me on the following activities: development of treatment plan with patient and/or surrogate as well as nursing, discussions with consultants, evaluation of patient's response to treatment, examination of patient, obtaining history from patient or surrogate, ordering and performing treatments and interventions, ordering and review of laboratory studies, ordering and review of radiographic  studies, pulse oximetry and re-evaluation of patient's condition.   Cheri Fowler MD Critical care physician [facility]  Pager: [phone] Mobile: [phone]

## 2020-04-01 NOTE — Procedures (Signed)
Percutaneous Tracheostomy Procedure Note   Lylian Sanagustin  [id number]  1966/06/16  Date:04/01/20  Time:3:20 PM   Provider Performing:Bevan Vu  Procedure: Percutaneous Tracheostomy with Bronchoscopic Guidance (10272)  Indication(s) Acute respiratory failure  Consent Risks of the procedure as well as the alternatives and risks of each were explained to the patient and/or caregiver.  Consent for the procedure was obtained.  Anesthesia Etomidate, Versed, Fentanyl, Vecuronium   Time Out Verified patient identification, verified procedure, site/side was marked, verified correct patient position, special equipment/implants available, medications/allergies/relevant history reviewed, required imaging and test results available.   Sterile Technique Maximal sterile technique including sterile barrier drape, hand hygiene, sterile gown, sterile gloves, mask, hair covering.    Procedure Description Appropriate anatomy identified by palpation.  Patient's neck prepped and draped in sterile fashion.  1% lidocaine with epinephrine was used to anesthetize skin overlying neck.  1.5cm incision made and blunt dissection performed until tracheal rings could be easily palpated.   Then a size 8 Shiley tracheostomy was placed under bronchoscopic visualization using usual Seldinger technique and serial dilation.   Bronchoscope confirmed placement above the carina.  Tracheostomy was sutured in place with adhesive pad to protect skin under pressure.    Patient connected to ventilator.   Complications/Tolerance None; patient tolerated the procedure well. Chest X-ray is ordered to confirm no post-procedural complication.   EBL Minimal   Specimen(s) None

## 2020-04-01 NOTE — Progress Notes (Signed)
eLink Physician-Brief Progress Note Patient Name: Vickie Alvarado DOB: 07/11/1966 MRN: [id number]   Date of Service  04/01/2020  HPI/Events of Note  Patient needs stat pre-operative  labs (CBC, BMP).  eICU Interventions  Stat CBC and BMP ordered.        Thomasene Lot Byran Bilotti 04/01/2020, 5:57 AM

## 2020-04-01 NOTE — Progress Notes (Signed)
   Providing Compassionate, Quality Care - Together  NEUROSURGERY PROGRESS NOTE   S: No issues overnight. More alert  O: EXAM:  BP 107/65   Pulse 61   Temp 98.1 F (36.7 C) (Oral)   Resp 13   Wt 67.2 kg   SpO2 100%   BMI 23.20 kg/m   Intubated Eyes open, looks around room PERRL Face symmetric BUE/BLEWD to pain Incision c/d/i  ASSESSMENT:  54 y.o. female with  1. Left frontal abscess with mass effect 2. 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 Intermedius - keppra - decadron, wean off - continue supportive care - dvt ppx w SQH - guarded prognosis - trach/peg today    Thank you for allowing me to participate in this patient's care.  Please do not hesitate to call with questions or concerns.   Monia Pouch, DO Neurosurgeon [facility] Cell: [phone]

## 2020-04-01 NOTE — Procedures (Signed)
Diagnostic Bronchoscopy  Vickie Alvarado  [id number]  [birth year]  Date:04/01/20  Time:4:15 PM   Provider Performing:Evalynn Hankins [name]   Procedure: Diagnostic Bronchoscopy (56153)  Indication(s) Assist with direct visualization of tracheostomy placement  Consent Risks of the procedure as well as the alternatives and risks of each were explained to the patient and/or caregiver.  Consent for the procedure was obtained.   Anesthesia See separate tracheostomy note   Time Out Verified patient identification, verified procedure, site/side was marked, verified correct patient position, special equipment/implants available, medications/allergies/relevant history reviewed, required imaging and test results available.   Sterile Technique Usual hand hygiene, masks, gowns, and gloves were used   Procedure Description Bronchoscope advanced through endotracheal tube and into airway.  After suctioning out tracheal secretions, bronchoscope used to provide direct visualization of tracheostomy placement.   Complications/Tolerance None; patient tolerated the procedure well.   EBL None  Specimen(s) None

## 2020-04-02 DIAGNOSIS — J9611 Chronic respiratory failure with hypoxia: Secondary | ICD-10-CM

## 2020-04-02 LAB — GLUCOSE, CAPILLARY
Glucose-Capillary: 109 mg/dL — ABNORMAL HIGH (ref 70–99)
Glucose-Capillary: 137 mg/dL — ABNORMAL HIGH (ref 70–99)
Glucose-Capillary: 145 mg/dL — ABNORMAL HIGH (ref 70–99)
Glucose-Capillary: 104 mg/dL — ABNORMAL HIGH (ref 70–99)
Glucose-Capillary: 131 mg/dL — ABNORMAL HIGH (ref 70–99)
Glucose-Capillary: 101 mg/dL — ABNORMAL HIGH (ref 70–99)

## 2020-04-02 MED ORDER — DEXAMETHASONE SODIUM PHOSPHATE 4 MG/ML IJ SOLN
4.0000 mg | Freq: Four times a day (QID) | INTRAMUSCULAR | Status: DC
  Administered 2020-04-02 – 2020-04-03 (×4): 4 mg via INTRAVENOUS
  Filled 2020-04-02 (×4): qty 1

## 2020-04-02 NOTE — Progress Notes (Signed)
   Providing Compassionate, Quality Care - Together  NEUROSURGERY PROGRESS NOTE   S: No issues overnight. S/p trach  O: EXAM:  BP (!) 132/91   Pulse 70   Temp 98.1 F (36.7 C) (Axillary)   Resp 16   Wt 67.2 kg   SpO2 100%   BMI 23.20 kg/m   Trach in place Eyes open, looks around room PERRL Face symmetric BUE/BLEWD to pain Incision c/d/i  ASSESSMENT:  54 y.o. female with  1. Left frontal abscess with mass effect 2. 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 Intermedius - keppra - decadron, wean off, now at 4q6 - continue supportive care - dvt ppx w SQH - guarded prognosis - PEG pending - will repeat MRI in 1-2 weeks   Thank you for allowing me to participate in this patient's care.  Please do not hesitate to call with questions or concerns.   Monia Pouch, DO Neurosurgeon [facility] Cell: [phone]

## 2020-04-02 NOTE — Progress Notes (Signed)
Pt transported to 2W23 without complications. RN and NT at bedside, all questions answered. Daughter called and updated on new room number.

## 2020-04-02 NOTE — Progress Notes (Signed)
PT Cancellation Note  Patient Details Name: Vickie Alvarado MRN: [id number] DOB: 26-Aug-1965   Cancelled Treatment:    Reason Eval/Treat Not Completed: Patient not medically ready; patient not alert or following commands, noted plans for PEG placement 9/8.  Will follow up.   Elray Mcgregor 04/02/2020, 4:30 PM  Sheran Lawless, PT Acute Rehabilitation Services Pager:[phone] [phone] 04/02/2020

## 2020-04-02 NOTE — Progress Notes (Signed)
Nutrition Follow-up  DOCUMENTATION CODES:   Not applicable  INTERVENTION:   Tube feeding via PEG tube (once ready for use): Jevity 1.2 at 65 ml/h (1560 ml per day) Prosource TF 45 ml BID  Provides 1952 kcal, 108 gm protein, 1265 ml free water daily    NUTRITION DIAGNOSIS:   Increased nutrient needs related to post-op healing as evidenced by estimated needs. Ongoing.   GOAL:   Patient will meet greater than or equal to 90% of their needs Met with TF.   MONITOR:   TF tolerance, I & O's  REASON FOR ASSESSMENT:   Ventilator, Consult Enteral/tube feeding initiation and management  ASSESSMENT:   Patient with PMH significant for HTN, HLD, COVID 19 infection (03/01/20), and dental infections. Presents this admission with R sided hemiplegia and R sided facial droop. 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  8/27 Pt with abscess in L basal ganglia s/p L frontal biopsy/aspiration 9/6 trach placed  Pt discussed during ICU rounds and with RN.  Plan for PEG tomorrow Pt has remained on trach collar since trach placement.   Medications reviewed and include: decadron, colace, SSI, novolog 3 units every 4 hours, lantus, miralax, 40 mEq KCl daily Labs reviewed:  Lab Results  Component Value Date   HGBA1C 11.5 (H) 03/22/2020      Diet Order:   Diet Order            Diet NPO time specified Except for: Sips with Meds  Diet effective midnight           Diet NPO time specified  Diet effective now                 EDUCATION NEEDS:   Not appropriate for education at this time  Skin:  Skin Assessment: Skin Integrity Issues: Skin Integrity Issues:: Incisions Incisions: L head  Last BM:  9/6 type 6  Height:   Ht Readings from Last 1 Encounters:  03/01/20 5' 7"  (1.702 m)    Weight:   Wt Readings from Last 1 Encounters:  04/01/20 67.2 kg    Ideal Body Weight:     BMI:  Body mass index is 23.2 kg/m.  Estimated Nutritional Needs:    Kcal:  1700-2000  Protein:  85-105 grams  Fluid:  >/= 1.7 L/day  Lockie Pares., RD, LDN, CNSC See AMiON for contact information

## 2020-04-02 NOTE — Progress Notes (Signed)
Orthopedic Tech Progress Note Patient Details:  Vickie Alvarado 1965-10-01 [id number]  Ortho Devices Type of Ortho Device: Prafo boot/shoe Ortho Device/Splint Location: BLE Ortho Device/Splint Interventions: Ordered, Application, Adjustment   Post Interventions Patient Tolerated: Well Instructions Provided: Care of device   Donald Pore 04/02/2020, 10:17 AM

## 2020-04-02 NOTE — Progress Notes (Signed)
NAME:  Vickie Alvarado, MRN:  [id number], DOB:  11-14-1965, LOS: 13 ADMISSION DATE:  03/20/2020, CONSULTATION DATE:  03/22/20 REFERRING MD:  Dr. 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 S/p L frontal stereotactic biopsy/aspiration of the abscess by neurosurgery and PCCM consulted to admit.  Past Medical History  Anxiety  Bipolar Disorder Dental infection HTN HLD  Significant Hospital Events   8/25> Admitted 8/27> Aspiration of brain abscess. Transferred to ICU   Consults:  PCCM  Neurosurgery   Procedures:  8/27> L frontal stereotactic biopsy/aspiration of L basal ganglia mass   Significant Diagnostic Tests:  8/25 MR Brain> 6.5x4.5x4 cm mass in the epicenter of the L basal ganglia and radiating white matter tracts 8/29 MR Brain, angio head> post op changes from drainage of L basal ganglia without complication. Lesion now 5.1x2.4x2.7cm. Persistent vasogenic edema throughout L cerebral hemisphere with mildly worse L to R shift, now 80mm. Interval multifocal posterior circulation infarcts. MRA without large vessel occlusion. Scattered vascular irregularity involving basilar and bilateral PCAs without occlusion.  CT head 8/31 >> slightly improved edema of L hemisphere, 29mm R shift, improved patency basal cisterns.   Micro Data:  8/26 BC x2> NGTD 8/27 Gram Stain> sent 8/27 Aerobic/Anaerobe> strep intermedius, pan sensitive  8/27 Fungus Culture with stain> Sent    Antimicrobials:  8/27 Cefepime>> 8/27 8/27 Vanc>> 8/29 8/27 Metro>> 8/29 8/27 Ceftriaxone>>  Interim history/subjective:  Percutaneous tracheostomy was done yesterday, since then patient remained on trach collar and tolerating well  Objective   Blood  pressure 125/66, pulse (!) 58, temperature 98.1 F (36.7 C), temperature source Axillary, resp. rate 15, weight 67.2 kg, SpO2 100 %.    Vent Mode: PSV;CPAP FiO2 (%):  [28 %-30 %] 28 % Set Rate:  [12 bmp] 12 bmp Vt Set:  [490 mL] 490 mL PEEP:  [5 cmH20] 5 cmH20 Pressure Support:  [5 cmH20] 5 cmH20 Plateau Pressure:  [11 cmH20-15 cmH20] 15 cmH20   Intake/Output Summary (Last 24 hours) at 04/02/2020 0918 Last data filed at 04/02/2020 0600 Gross per 24 hour  Intake 776.44 ml  Output 900 ml  Net -123.56 ml   Filed Weights   03/30/20 0500 03/31/20 0500 04/01/20 0500  Weight: 68.3 kg 67.3 kg 67.2 kg   Physical Exam General: Chronically ill appearing, s/p trach HEENT: cranial suture in place, moist mucous membranes Neuro: Opens eyes spontaneously, does not track examiner, disconjugate gaze does not follow any command.  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  Acute toxic/metabolic encephalopathy Bipolar Disorder Acute hypoxic respiratory failure requiring mechanical ventilation Hypertension Dyslipidemia Poorly controlled diabetes with hyperglycemia  Continue Keppra for seizure prophylaxis She is on IV ceftriaxone for strep intermedius brain abscess per discussion with infectious disease, still unclear the duration of therapy Continue dexamethasone taper per discussion with neurosurgery currently on  6 mg every 6 hours  Patient is scheduled for PEG placement by IR tomorrow VAP prevention order set Pulmonary hygiene Continue hydralazine and labetalol as needed with SBP goal  <160 Continue amlodipine Patient blood sugar is better controlled now within the goal of 140-180, continue Lantus to 20 units and continue sliding scale insulin  Best practice:  Diet: N.p.o. for now Pain/Anxiety/Delirium protocol (if indicated): N/A DVT prophylaxis: Subq Heparin Glucose control: SSI   Mobility: Bed rest  Code Status: FULL  Family Communication: Patient's daughter was updated at bedside Disposition: Transfer to PCU  Labs   CBC: Recent Labs  Lab 03/27/20 0443 03/28/20 0409 03/29/20 0515 03/31/20 1016 04/01/20 0620  WBC 12.4* 13.5* 17.4* 19.4* 17.8*  NEUTROABS 10.1*  --   --   --  14.7*  HGB 9.8* 10.6* 11.2* 11.6* 11.2*  HCT 31.3* 34.2* 35.1* 35.9* 35.1*  MCV 92.6 92.4 92.9 91.8 92.1  PLT 224 232 230 237 232    Basic Metabolic Panel: Recent Labs  Lab 03/26/20 1217 03/26/20 1743 03/27/20 0443 03/27/20 0443 03/27/20 1800 03/28/20 0409 03/29/20 0515 03/31/20 0500 04/01/20 0620  NA 156*   < > 152*   < > 149* 144 140 138 139  K  --   --  3.5  --   --  3.8 3.7 3.8 3.8  CL  --   --  118*  --   --  109 105 103 103  CO2  --   --  26  --   --  26 27 25 27   GLUCOSE  --   --  253*  --   --  247* 254* 245* 144*  BUN  --   --  24*  --   --  23* 17 17 16   CREATININE  --   --  0.52  --   --  0.43* 0.41* 0.40* 0.35*  CALCIUM  --   --  8.5*  --   --  8.7* 8.6* 8.5* 8.4*  MG 2.3  --  2.1  --   --  2.2 2.1  --   --   PHOS  --   --   --   --   --   --  3.7  --   --    < > = values in this interval not displayed.   GFR: Estimated Creatinine Clearance: 77.3 mL/min (A) (by C-G formula based on SCr of 0.35 mg/dL (L)). Recent Labs  Lab 03/28/20 0409 03/29/20 0515 03/31/20 1016 04/01/20 0620  WBC 13.5* 17.4* 19.4* 17.8*    Liver Function Tests: Recent Labs  Lab 03/27/20 0443  AST 24  ALT 64*  ALKPHOS 55  BILITOT 0.3  PROT 5.1*  ALBUMIN 2.5*   No results for input(s): LIPASE, AMYLASE in the last 168 hours. No results for input(s): AMMONIA in the last  168 hours.  ABG    Component Value Date/Time   PHART 7.458 (H) 03/23/2020 0525   PCO2ART 29.1 (L) 03/23/2020 0525   PO2ART 106 03/23/2020 0525   HCO3 20.6 03/23/2020 0525   TCO2 21 (L) 03/23/2020 0525   ACIDBASEDEF 2.0 03/23/2020 0525   O2SAT 98.0 03/23/2020 0525     Coagulation Profile: No results for input(s): INR, PROTIME in the last 168 hours.  Cardiac Enzymes: No results for input(s): CKTOTAL, CKMB, CKMBINDEX, TROPONINI in the last 168 hours.  HbA1C: Hgb A1c MFr Bld  Date/Time Value Ref Range Status  03/22/2020 04:55 PM 11.5 (H) 4.8 - 5.6 %  Final    Comment:    (NOTE) Pre diabetes:          5.7%-6.4%  Diabetes:              >6.4%  Glycemic control for   <7.0% adults with diabetes     CBG: Recent Labs  Lab 04/01/20 1651 04/01/20 1945 04/01/20 2328 04/02/20 0328 04/02/20 0734  GLUCAP 140* 162* 141* 109* 137*    Cheri Fowler MD Critical care physician [facility]  Pager: [phone] Mobile: [phone]

## 2020-04-03 LAB — GLUCOSE, CAPILLARY
Glucose-Capillary: 136 mg/dL — ABNORMAL HIGH (ref 70–99)
Glucose-Capillary: 121 mg/dL — ABNORMAL HIGH (ref 70–99)
Glucose-Capillary: 102 mg/dL — ABNORMAL HIGH (ref 70–99)
Glucose-Capillary: 109 mg/dL — ABNORMAL HIGH (ref 70–99)
Glucose-Capillary: 121 mg/dL — ABNORMAL HIGH (ref 70–99)
Glucose-Capillary: 109 mg/dL — ABNORMAL HIGH (ref 70–99)

## 2020-04-03 MED ORDER — DEXAMETHASONE SODIUM PHOSPHATE 4 MG/ML IJ SOLN
4.0000 mg | Freq: Three times a day (TID) | INTRAMUSCULAR | Status: AC
  Administered 2020-04-03 – 2020-04-04 (×3): 4 mg via INTRAVENOUS
  Filled 2020-04-03 (×3): qty 1

## 2020-04-03 NOTE — Progress Notes (Signed)
PROGRESS NOTE    Vickie Alvarado  KXF:[id number] DOB: May 22, 1966 DOA: 03/20/2020 PCP: [facility]    No chief complaint on file.   Brief Narrative:   54 year old lady with prior history of hypertension, hyperlipidemia, COVID-19 infection diagnosed on 03/01/2020, dental infection and sinus infections presented with right-sided weakness, dysarthria and right facial droop was found to have abscess in the left basal ganglion.  Patient underwent left frontal stereotactic biopsy and aspiration of the abscess by neurosurgery on 03/22/2020 and transferred to ICU.  Patient was initially consulted by PCCM.  Hospital course was complicated by Streptococcus intermedius bacteremia, ID consulted and she is currently on Rocephin to complete the course.  Her course was complicated by acute hypoxic respiratory failure requiring mechanical ventilation, failure to extubate underwent percutaneous tracheostomy and patient currently remains on trach collar and is tolerating well on 5 L of oxygen.  Patient was transferred to [facility] on 04/03/2020.  Neurosurgery on board.   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 Pulmonary hygiene.    Essential hypertension Blood pressure parameters are optimal, SBP goal of less than 160  mmHg. Continue with amlodipine.    Diabetes mellitus, poorly controlled with hyperglycemia CBG (last 3)  Recent Labs    04/03/20 0750 04/03/20 1151 04/03/20 1607  GLUCAP 121* 102* 109*   Continue with sliding scale insulin.   DVT prophylaxis: Heparin Code Status: (Full code Family Communication: None at bedside Disposition:   Status is: Inpatient  Remains inpatient appropriate because:Altered mental status, Unsafe d/c plan, IV treatments appropriate due to intensity of illness or inability to take PO and Inpatient level of care appropriate due to severity of illness   Dispo: The patient is from: Home              Anticipated d/c is to: Pending              Anticipated d/c date is: > 3 days              Patient currently is not medically stable to d/c.       Consultants:   Neurosurgery  PCCM  Procedures:  8/27> L frontal stereotactic biopsy/aspiration of L basal ganglia mass    Micro Data:  8/26 BC x2> NGTD 8/27 Gram Stain> sent 8/27 Aerobic/Anaerobe> strep intermedius, pan sensitive  8/27 Fungus Culture with stain> Sent    Antimicrobials:  8/27 Cefepime>> 8/27 8/27 Vanc>> 8/29 8/27 Metro>> 8/29 8/27 Ceftriaxone>>   Subjective: Patient appears comfortable on trach collar and tolerating well.  Objective: Vitals:   04/03/20 1017 04/03/20 1109 04/03/20 1152 04/03/20 1609  BP:   121/70 130/74  Pulse:  68 60 77  Resp:   19 (!) 25  Temp:   97.6 F (36.4 C) 97.8 F (36.6 C)  TempSrc:   Oral Oral  SpO2: 100%  98% 94%  Weight:        Intake/Output Summary (Last 24 hours) at 04/03/2020 1831 Last data filed at 04/03/2020 1700 Gross per 24 hour  Intake 1354.01 ml  Output 220 ml  Net 1134.01 ml   Filed Weights   04/01/20 0500 04/02/20 2130 04/03/20 0103  Weight: 67.2 kg 87.3 kg 87.3 kg    Examination:  General exam: Ill looking female, s/p trach on 5 L of oxygen Respiratory system: Diminished air entry at bases, no wheezing or  rhonchi Cardiovascular system: S1 & S2 heard, RRR. Marland Kitchen No pedal edema. Gastrointestinal system: Abdomen is nondistended, soft and nontender.Normal bowel sounds heard. Central nervous system: Unresponsive, does not follow commands opens eyes spontaneously Extremities: No pedal edema Skin: No rashes, lesions or ulcers Psychiatry: Cannot be assessed    Data Reviewed: I have personally reviewed following labs and imaging studies  CBC: Recent Labs  Lab 03/28/20 0409 03/29/20 0515 03/31/20 1016 04/01/20 0620  WBC 13.5* 17.4* 19.4* 17.8*  NEUTROABS  --   --   --  14.7*  HGB 10.6* 11.2* 11.6* 11.2*  HCT 34.2* 35.1* 35.9* 35.1*  MCV 92.4 92.9 91.8 92.1  PLT 232 230 237 646    Basic Metabolic Panel: Recent Labs  Lab 03/28/20 0409 03/29/20 0515 03/31/20 0500 04/01/20 0620  NA 144 140 138 139  K 3.8 3.7 3.8 3.8  CL 109 105 103 103  CO2 _0 GLUCOSE 247* 254* 245* 144*  BUN 23* _1 CREATININE 0.43* 0.41* 0.40* 0.35*  CALCIUM 8.7* 8.6* 8.5* 8.4*  MG 2.2 2.1  --   --   PHOS  --  3.7  --   --     GFR: Estimated Creatinine Clearance: 90.2 mL/min (A) (by C-G formula based on SCr of 0.35 mg/dL (L)).  Liver Function Tests: No results for input(s): AST, ALT, ALKPHOS, BILITOT, PROT, ALBUMIN in the last 168 hours.  CBG: Recent Labs  Lab 04/02/20 2309 04/03/20 0304 04/03/20 0750 04/03/20 1151 04/03/20 1607  GLUCAP 101* 136* 121* 102* 109*     No results found for this or any previous visit (from the past 240 hour(s)).       Radiology Studies: No results found.      Scheduled Meds: . amLODipine  5 mg Per Tube Daily  . chlorhexidine gluconate (MEDLINE KIT)  15 mL Mouth Rinse BID  . Chlorhexidine Gluconate Cloth  6 each Topical Daily  . dexamethasone (DECADRON) injection  4 mg Intravenous Q8H  . docusate  100 mg Per Tube BID  . feeding supplement (PROSource TF)  45 mL Per Tube BID  . heparin injection (subcutaneous)  5,000 Units Subcutaneous Q8H   . insulin aspart  0-20 Units Subcutaneous Q4H  . insulin aspart  3 Units Subcutaneous Q4H  . insulin glargine  20 Units Subcutaneous Daily  . mouth rinse  15 mL Mouth Rinse 10 times per day  . pantoprazole sodium  40 mg Per Tube QHS  . polyethylene glycol  17 g Per Tube Daily  . potassium chloride  40 mEq Per Tube Daily  . sodium chloride flush  10-40 mL Intracatheter Q12H   Continuous Infusions: .  ceFAZolin (ANCEF) IV    . cefTRIAXone (ROCEPHIN)  IV 2 g (04/03/20 0901)  . dextrose 5% lactated ringers 30 mL/hr at 04/03/20 0429  . feeding supplement (JEVITY 1.2 CAL) Stopped (04/01/20 0016)  . levETIRAcetam 500 mg (04/03/20 1510)     LOS: 14  days       Hosie Poisson, MD [facility]   To contact the attending provider between 7A-7P or the covering provider during after hours 7P-7A, please log into the web site [email or web address] and access using universal [place] password for that web site. If you do not have the password, please call the hospital operator.  04/03/2020, 6:31 PM

## 2020-04-03 NOTE — Progress Notes (Signed)
IR.  Requested by Dr. Merrily Pew for possible image-guided percutaneous gastrostomy tube placement.  Unfortunately, multiple emergent procedures in IR today so unable to accommodate gastrostomy tube placement. Plan for possible procedure in IR 04/04/2020 pending IR scheduling. Patient to be NPO at midnight- hold all tube feeds at this time as well.  Please call IR with questions/concerns.   Waylan Boga Laquitha Heslin, PA-C 04/03/2020, 4:42 PM

## 2020-04-03 NOTE — Progress Notes (Signed)
Changed water bottle

## 2020-04-03 NOTE — Evaluation (Signed)
Physical Therapy Evaluation Patient Details Name: Vickie Alvarado MRN: [id number] DOB: 1966/05/10 Today's Date: 04/03/2020   History of Present Illness  54 y/o F with a history of HTN, HLD, COVID-19 infection (03/01/20), dental infections, and sinus infections presented to [facility] after being found altered with R sided weakness, dysarthria, and R facial droop. Patient had decreased  P.O. intake, decreased appetite, and projectile vomiting in addition to her other symptoms. 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 Neurosurgery performed a L frontal stereotactic biopsy/aspiration of the abscess. 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 ETT 8/28, trach 9/6.  Clinical Impression   Pt presents with hypotonicity in all limbs, very little to no cognitive engagement during session, + blink to threat L and + startle response, and requires total +2 assist for pull to long sit this day. Pt engaging with PT and OT with eye contact only. Pt with no response to noxious stimuli, and unable to answer yes/no questions with blinking system. PT to continue to follow to assess for progress, recommendation for SNF vs LTACH post-acutely. PT to see pt on trial basis while acute, if pt does not make cognitive or mobility progress, PT to sign off. PT to progress mobility as tolerated.     Follow Up Recommendations SNF;LTACH    Equipment Recommendations  None recommended by PT    Recommendations for Other Services       Precautions / Restrictions Precautions Precautions: Fall;Other (comment) Precaution Comments: trach 5L/28%, hypotonicity Restrictions Weight Bearing Restrictions: No      Mobility  Bed Mobility Overal bed mobility: Needs Assistance Bed Mobility: Supine to Sit;Sit to Supine     Supine to sit: Total assist;+2 for physical assistance Sit  to supine: Total assist;+2 for physical assistance   General bed mobility comments: Total A x 2 for long sitting in bed. Pt with weak musculature and unable to hold head up while long sitting  Transfers                 General transfer comment: deferred  Ambulation/Gait                Stairs            Wheelchair Mobility    Modified Rankin (Stroke Patients Only) Modified Rankin (Stroke Patients Only) Pre-Morbid Rankin Score: No symptoms Modified Rankin: Severe disability     Balance Overall balance assessment: Needs assistance   Sitting balance-Leahy Scale: Zero Sitting balance - Comments: Zero balance long sitting                                      Pertinent Vitals/Pain Pain Assessment: Faces Faces Pain Scale: No hurt Pain Intervention(s): Monitored during session    Home Living Family/patient expects to be discharged to:: Private residence Living Arrangements: Children               Additional Comments: Unsure of home setup as pt unable to respond to questions    Prior Function           Comments: Unsure of PLOF as pt unable to respond to questions     Hand Dominance        Extremity/Trunk Assessment   Upper Extremity Assessment Upper  Extremity Assessment: Defer to OT evaluation RUE Deficits / Details: PROM WFL, flaccid UE with no voluntary movement noted RUE Sensation: decreased light touch;decreased proprioception RUE Coordination: decreased fine motor;decreased gross motor LUE Deficits / Details: PROM WFL, no active movement of L UE LUE Sensation: decreased light touch;decreased proprioception LUE Coordination: decreased fine motor;decreased gross motor    Lower Extremity Assessment Lower Extremity Assessment: RLE deficits/detail;LLE deficits/detail RLE Deficits / Details: PROM hip, knee WFL, Ankle DF reaches 0*. Hypotonicity noted RLE Sensation: decreased light touch RLE Coordination: decreased fine  motor;decreased gross motor LLE Deficits / Details: PROM hip, knee WFL, Ankle DF reaches 0*. Hypotonicity noted LLE Sensation: decreased light touch LLE Coordination: decreased gross motor;decreased fine motor    Cervical / Trunk Assessment Cervical / Trunk Assessment: Other exceptions Cervical / Trunk Exceptions: Severe weakness, unable to hold head up for long sitting  Communication   Communication: Tracheostomy  Cognition Arousal/Alertness: Awake/alert Behavior During Therapy: Flat affect Overall Cognitive Status: Difficult to assess Area of Impairment: Attention;Following commands;Awareness;Problem solving                 Orientation Level: Place;Time;Situation Current Attention Level: Focused Memory: Decreased recall of precautions;Decreased short-term memory Following Commands: Follows one step commands inconsistently Safety/Judgement: Decreased awareness of safety;Decreased awareness of deficits Awareness: Intellectual Problem Solving: Slow processing;Decreased initiation;Difficulty sequencing;Requires verbal cues;Requires tactile cues General Comments: Pt minimally responsive beyond eye opening/closing, min visual tracking. No consistent command following, or response to yes/no questions with blinking      General Comments General comments (skin integrity, edema, etc.): No response to painful stimuli, + blink to threat L eye only, dysconjugate gaze with suspected R eye visual impairment, + startle response    Exercises General Exercises - Lower Extremity Ankle Circles/Pumps: PROM;Both;5 reps;Supine Heel Slides: PROM;Both;5 reps;Supine Hip ABduction/ADduction: PROM;Both;5 reps;Supine   Assessment/Plan    PT Assessment Patient needs continued PT services  PT Problem List Decreased strength;Decreased mobility;Impaired tone;Decreased cognition;Decreased balance;Decreased activity tolerance;Impaired sensation;Cardiopulmonary status limiting activity       PT  Treatment Interventions DME instruction;Therapeutic activities;Therapeutic exercise;Patient/family education;Balance training;Functional mobility training;Neuromuscular re-education    PT Goals (Current goals can be found in the Care Plan section)  Acute Rehab PT Goals Patient Stated Goal: unable PT Goal Formulation: Patient unable to participate in goal setting Time For Goal Achievement: 04/17/20 Potential to Achieve Goals: Poor    Frequency Min 2X/week   Barriers to discharge        Co-evaluation PT/OT/SLP Co-Evaluation/Treatment: Yes Reason for Co-Treatment: Complexity of the patient's impairments (multi-system involvement);For patient/therapist safety;Necessary to address cognition/behavior during functional activity PT goals addressed during session: Mobility/safety with mobility;Balance;Strengthening/ROM OT goals addressed during session: ADL's and self-care       AM-PAC PT "6 Clicks" Mobility  Outcome Measure Help needed turning from your back to your side while in a flat bed without using bedrails?: Total Help needed moving from lying on your back to sitting on the side of a flat bed without using bedrails?: Total Help needed moving to and from a bed to a chair (including a wheelchair)?: Total Help needed standing up from a chair using your arms (e.g., wheelchair or bedside chair)?: Total Help needed to walk in hospital room?: Total Help needed climbing 3-5 steps with a railing? : Total 6 Click Score: 6    End of Session Equipment Utilized During Treatment: Oxygen Activity Tolerance: Other (comment) (low arousal) Patient left: in bed;with bed alarm set;with call bell/phone within reach;with SCD's reapplied;Other (comment) (with PRAFOs donned)  Nurse Communication: Mobility status PT Visit Diagnosis: Muscle weakness (generalized) (M62.81);Hemiplegia and hemiparesis Hemiplegia - Right/Left: Right Hemiplegia - dominant/non-dominant: Dominant Hemiplegia - caused by: Other  cerebrovascular disease    Time: 1307-1326 PT Time Calculation (min) (ACUTE ONLY): 19 min   Charges:   PT Evaluation $PT Eval Low Complexity: 1 Low          [place] E, PT Acute Rehabilitation Services Pager [phone]  Office [phone]    Moroni Nester D Despina Hidden 04/03/2020, 4:56 PM

## 2020-04-03 NOTE — Progress Notes (Signed)
   Providing Compassionate, Quality Care - Together  NEUROSURGERY PROGRESS NOTE   S: No issues overnight.   O: EXAM:  BP 117/72 (BP Location: Left Arm)   Pulse 72   Temp 98.6 F (37 C) (Oral)   Resp 17   Wt 87.3 kg   SpO2 98%   BMI 30.14 kg/m   Trach in place Eyes open, looks around room PERRL Face symmetric BUE/BLEWD to pain Incision c/d/i  ASSESSMENT: 54 y.o.femalewith  1. Left frontal abscess with mass effect 2. 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 Intermedius - keppra - decadron, wean off - continue supportive care - dvt ppx w SQH - guarded prognosis - will repeat MRI in 1-2 weeks  Thank you for allowing me to participate in this patient's care.  Please do not hesitate to call with questions or concerns.   Monia Pouch, DO Neurosurgeon [facility] Cell: [phone]

## 2020-04-03 NOTE — Progress Notes (Signed)
Pt. Right foot cool to touch. MD in the room, made aware and assessed.

## 2020-04-03 NOTE — Evaluation (Signed)
Occupational Therapy Evaluation Patient Details Name: Vickie Alvarado MRN: [id number] DOB: 1965/11/19 Today's Date: 04/03/2020    History of Present Illness 54 y/o F with a history of HTN, HLD, COVID-19 infection (03/01/20), dental infections, and sinus infections presented to [facility] after being found altered with R sided weakness, dysarthria, and R facial droop. Patient had decreased  P.O. intake, decreased appetite, and projectile vomiting in addition to her other symptoms. 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 Neurosurgery performed a L frontal stereotactic biopsy/aspiration of the abscess. 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 ETT 8/28, trach 9/6.   Clinical Impression   Pt presents now with diagnoses above and deficits in cognition, strength, balance, coordination, sensation, proprioception, and vision. Pt with +startle reflex to L visual field, no reflexes noted in R visual field. Pt unable to follow commands, answer yes/no questions, or demonstrate voluntary movement. Pt Total A for all bed mobility and ADLs. Deferred OOB activity attempts today. Repositioned pt for skin integrity maintenance. Plan to monitor and assess for improvements in muscle activation and command following to maximize quality of life. VSS on 15 L O2.     Follow Up Recommendations  SNF;LTACH;Supervision/Assistance - 24 hour    Equipment Recommendations  Hospital bed    Recommendations for Other Services       Precautions / Restrictions Precautions Precautions: Fall;Other (comment) Precaution Comments: trach, hypotonicity  Restrictions Weight Bearing Restrictions: No      Mobility Bed Mobility Overal bed mobility: Needs Assistance             General bed mobility comments: Total A x 2 for long sitting in bed. Pt with weak musculature and  unable to hold head up while long sitting  Transfers                 General transfer comment: deferred    Balance Overall balance assessment: Needs assistance   Sitting balance-Leahy Scale: Zero Sitting balance - Comments: Zero balance long sitting                                    ADL either performed or assessed with clinical judgement   ADL Overall ADL's : Needs assistance/impaired Eating/Feeding: NPO   Grooming: Total assistance;Wash/dry face Grooming Details (indicate cue type and reason): Total A to wash face at bed level, no active movement Upper Body Bathing: Total assistance;Bed level   Lower Body Bathing: Total assistance;Bed level   Upper Body Dressing : Total assistance;Bed level   Lower Body Dressing: Total assistance;Bed level       Toileting- Clothing Manipulation and Hygiene: Total assistance;Bed level         General ADL Comments: Pt Total A for all ADLs and mobility. Unable to follow commands, flaccid UE/LE     Vision Patient Visual Report: Other (comment) (Unsure, to be further assessed) Vision Assessment?: Vision impaired- to be further tested in functional context;Yes Eye Alignment: Impaired (comment) Ocular Range of Motion: Impaired-to be further tested in functional context Alignment/Gaze Preference: Gaze left Tracking/Visual Pursuits: Impaired - to be further tested in functional context Visual Fields: Right visual field deficit;Impaired-to be further tested in functional context Additional Comments: Pt with + reaction of threat, startle on L eye. No awareness  or response to movement in R visual field. Preference for L sided gaze but noted to look to R on therapists exit.      Perception Perception Perception Tested?: Yes Perception Deficits: Inattention/neglect Inattention/Neglect: Does not attend to right visual field;Impaired- to be further tested in functional context Comments: Does not attend to R side/visual  field. No active movement on either side   Praxis Praxis Praxis tested?: Deficits Deficits: Initiation Praxis-Other Comments: no initiation of task    Pertinent Vitals/Pain Pain Assessment: Faces Faces Pain Scale: No hurt Pain Intervention(s): Monitored during session     Hand Dominance     Extremity/Trunk Assessment Upper Extremity Assessment Upper Extremity Assessment: RUE deficits/detail;LUE deficits/detail RUE Deficits / Details: PROM WFL, flaccid UE with no voluntary movement noted RUE Sensation: decreased light touch;decreased proprioception RUE Coordination: decreased fine motor;decreased gross motor LUE Deficits / Details: PROM WFL, no active movement of L UE LUE Sensation: decreased light touch;decreased proprioception LUE Coordination: decreased fine motor;decreased gross motor   Lower Extremity Assessment Lower Extremity Assessment: Defer to PT evaluation   Cervical / Trunk Assessment Cervical / Trunk Assessment: Other exceptions Cervical / Trunk Exceptions: Severe weakness, unable to hold head up for long sitting   Communication Communication Communication: Tracheostomy   Cognition Arousal/Alertness: Awake/alert Behavior During Therapy: Flat affect Overall Cognitive Status: Impaired/Different from baseline Area of Impairment: Orientation;Memory;Attention;Following commands;Safety/judgement;Awareness;Problem solving                 Orientation Level: Place;Time;Situation Current Attention Level: Focused Memory: Decreased recall of precautions;Decreased short-term memory Following Commands: Follows one step commands inconsistently Safety/Judgement: Decreased awareness of safety;Decreased awareness of deficits Awareness: Intellectual Problem Solving: Slow processing;Decreased initiation;Difficulty sequencing;Requires verbal cues;Requires tactile cues General Comments: Pt overall unable to respond to questions, no consistent following of commands or  answers to yes/no questions (unable to consistently blink for answers)   General Comments  Pt with no pain response on either side. Unable to initiate or follow commands. With long sitting attempt, head flexed forward with inability to manage secretions requiring suction    Exercises     Shoulder Instructions      Home Living Family/patient expects to be discharged to:: Private residence Living Arrangements: Children                               Additional Comments: Unsure of home setup as pt unable to respond to questions      Prior Functioning/Environment          Comments: Unsure of PLOF as pt unable to respond to questions        OT Problem List: Decreased strength;Decreased activity tolerance;Impaired balance (sitting and/or standing);Impaired vision/perception;Decreased coordination;Decreased cognition;Decreased safety awareness;Decreased knowledge of use of DME or AE;Cardiopulmonary status limiting activity;Impaired sensation;Impaired tone;Impaired UE functional use      OT Treatment/Interventions: Self-care/ADL training;Therapeutic exercise;Energy conservation;DME and/or AE instruction;Therapeutic activities;Patient/family education    OT Goals(Current goals can be found in the care plan section) Acute Rehab OT Goals Patient Stated Goal: unable OT Goal Formulation: Patient unable to participate in goal setting Time For Goal Achievement: 04/17/20 Potential to Achieve Goals: Fair ADL Goals Pt Will Perform Grooming: with mod assist;bed level Additional ADL Goal #1: Pt to demonstrate ability to follow one step commands 40% of the time to increase ADL participation Additional ADL Goal #2: Pt/caregiver to demonstrate appropriate positioning to prevent contracture formation and maintain skin integrity.  OT Frequency: Min 2X/week  Barriers to D/C:            Co-evaluation PT/OT/SLP Co-Evaluation/Treatment: Yes Reason for Co-Treatment: Complexity of the  patient's impairments (multi-system involvement);Necessary to address cognition/behavior during functional activity;For patient/therapist safety   OT goals addressed during session: ADL's and self-care      AM-PAC OT "6 Clicks" Daily Activity     Outcome Measure Help from another person eating meals?: Total Help from another person taking care of personal grooming?: Total Help from another person toileting, which includes using toliet, bedpan, or urinal?: Total Help from another person bathing (including washing, rinsing, drying)?: Total Help from another person to put on and taking off regular upper body clothing?: Total Help from another person to put on and taking off regular lower body clothing?: Total 6 Click Score: 6   End of Session Equipment Utilized During Treatment: Oxygen Nurse Communication: Mobility status  Activity Tolerance: Treatment limited secondary to medical complications (Comment) Patient left: in bed;with call bell/phone within reach;with bed alarm set  OT Visit Diagnosis: Muscle weakness (generalized) (M62.81);Other abnormalities of gait and mobility (R26.89);Apraxia (R48.2);Ataxia, unspecified (R27.0);Feeding difficulties (R63.3);Other symptoms and signs involving cognitive function;Other symptoms and signs involving the nervous system (R29.898)                Time: 6144-3154 OT Time Calculation (min): 22 min Charges:  OT General Charges $OT Visit: 1 Visit OT Evaluation $OT Eval High Complexity: 1 High  Lorre Munroe, OTR/L  Lorre Munroe 04/03/2020, 2:35 PM

## 2020-04-04 ENCOUNTER — Encounter (HOSPITAL_COMMUNITY): Payer: Self-pay | Admitting: Internal Medicine

## 2020-04-04 ENCOUNTER — Inpatient Hospital Stay (HOSPITAL_COMMUNITY): Payer: Medicare HMO

## 2020-04-04 HISTORY — PX: IR GASTROSTOMY TUBE MOD SED: IMG625

## 2020-04-04 LAB — CBC WITH DIFFERENTIAL/PLATELET
Abs Immature Granulocytes: 0.04 10*3/uL (ref 0.00–0.07)
Basophils Absolute: 0 10*3/uL (ref 0.0–0.1)
Basophils Relative: 0 %
Eosinophils Absolute: 0 10*3/uL (ref 0.0–0.5)
Eosinophils Relative: 0 %
HCT: 35.8 % — ABNORMAL LOW (ref 36.0–46.0)
Hemoglobin: 11.6 g/dL — ABNORMAL LOW (ref 12.0–15.0)
Immature Granulocytes: 0 %
Lymphocytes Relative: 10 %
Lymphs Abs: 1.1 10*3/uL (ref 0.7–4.0)
MCH: 29.6 pg (ref 26.0–34.0)
MCHC: 32.4 g/dL (ref 30.0–36.0)
MCV: 91.3 fL (ref 80.0–100.0)
Monocytes Absolute: 0.7 10*3/uL (ref 0.1–1.0)
Monocytes Relative: 7 %
Neutro Abs: 8.9 10*3/uL — ABNORMAL HIGH (ref 1.7–7.7)
Neutrophils Relative %: 83 %
Platelets: 199 10*3/uL (ref 150–400)
RBC: 3.92 MIL/uL (ref 3.87–5.11)
RDW: 17 % — ABNORMAL HIGH (ref 11.5–15.5)
WBC: 10.7 10*3/uL — ABNORMAL HIGH (ref 4.0–10.5)
nRBC: 0 % (ref 0.0–0.2)

## 2020-04-04 LAB — COMPREHENSIVE METABOLIC PANEL
ALT: 48 U/L — ABNORMAL HIGH (ref 0–44)
AST: 16 U/L (ref 15–41)
Albumin: 2.6 g/dL — ABNORMAL LOW (ref 3.5–5.0)
Alkaline Phosphatase: 67 U/L (ref 38–126)
Anion gap: 9 (ref 5–15)
BUN: 15 mg/dL (ref 6–20)
CO2: 22 mmol/L (ref 22–32)
Calcium: 8.2 mg/dL — ABNORMAL LOW (ref 8.9–10.3)
Chloride: 103 mmol/L (ref 98–111)
Creatinine, Ser: 0.34 mg/dL — ABNORMAL LOW (ref 0.44–1.00)
GFR calc Af Amer: >60 mL/min (ref >60)
GFR calc non Af Amer: >60 mL/min (ref >60)
Glucose, Bld: 208 mg/dL — ABNORMAL HIGH (ref 70–99)
Potassium: 3.8 mmol/L (ref 3.5–5.1)
Sodium: 134 mmol/L — ABNORMAL LOW (ref 135–145)
Total Bilirubin: 0.3 mg/dL (ref 0.3–1.2)
Total Protein: 5.3 g/dL — ABNORMAL LOW (ref 6.5–8.1)

## 2020-04-04 LAB — GLUCOSE, CAPILLARY
Glucose-Capillary: 106 mg/dL — ABNORMAL HIGH (ref 70–99)
Glucose-Capillary: 135 mg/dL — ABNORMAL HIGH (ref 70–99)
Glucose-Capillary: 152 mg/dL — ABNORMAL HIGH (ref 70–99)
Glucose-Capillary: 83 mg/dL (ref 70–99)
Glucose-Capillary: 85 mg/dL (ref 70–99)
Glucose-Capillary: 95 mg/dL (ref 70–99)

## 2020-04-04 IMAGING — XA IR PERC PLACEMENT GASTROSTOMY
6 series · 13 of 13 positions shown · non-contrast
Comparison: CT abdomen and pelvis-[DATE]

INDICATION: Dysphagia. Please perform percutaneous gastrostomy tube for enteric
nutrition supplementation purposes.

EXAM:
PULL TROUGH GASTROSTOMY TUBE PLACEMENT

[Series 1: fl (-) angio · 1 of 1 slices shown (1 of 6)]
[im 1/1]
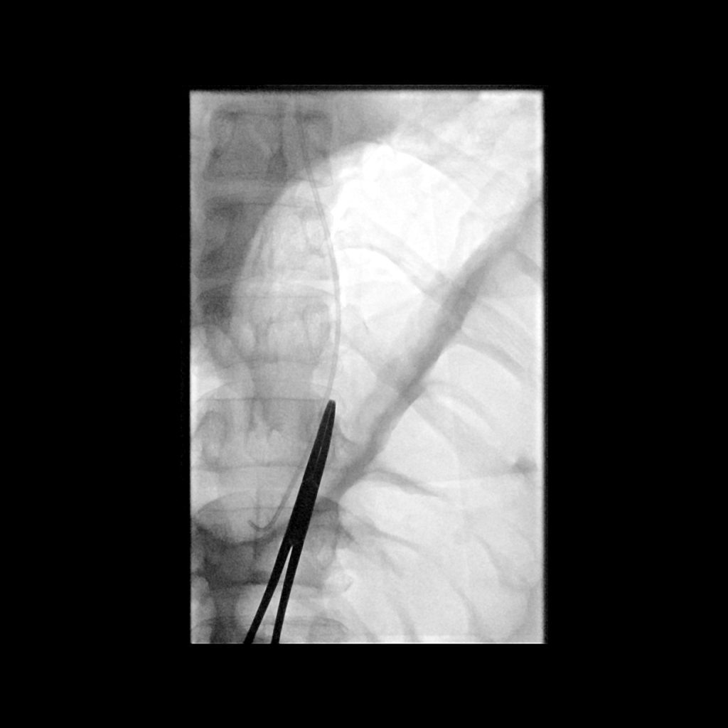

[Series 2: fl (-) angio · 1 of 1 slices shown (2 of 6)]
[im 1/1]
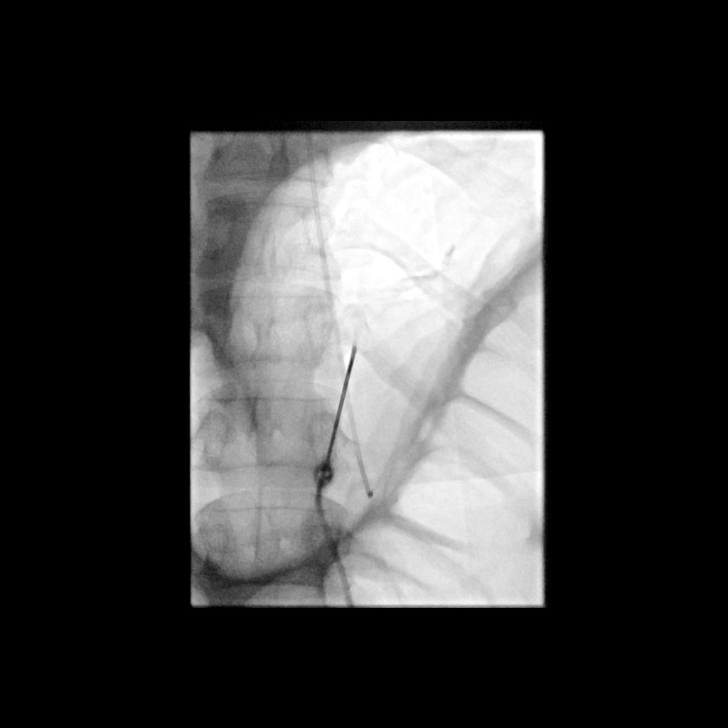

[Series 3: fl (-) angio · 1 of 1 slices shown (3 of 6)]
[im 1/1]
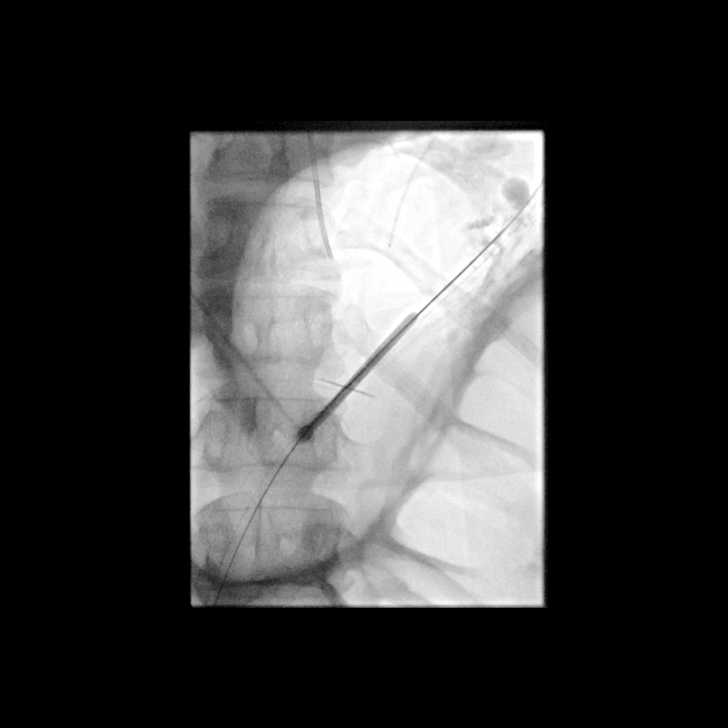

[Series 4: fl (-) angio · 1 of 1 slices shown (4 of 6)]
[im 1/1]
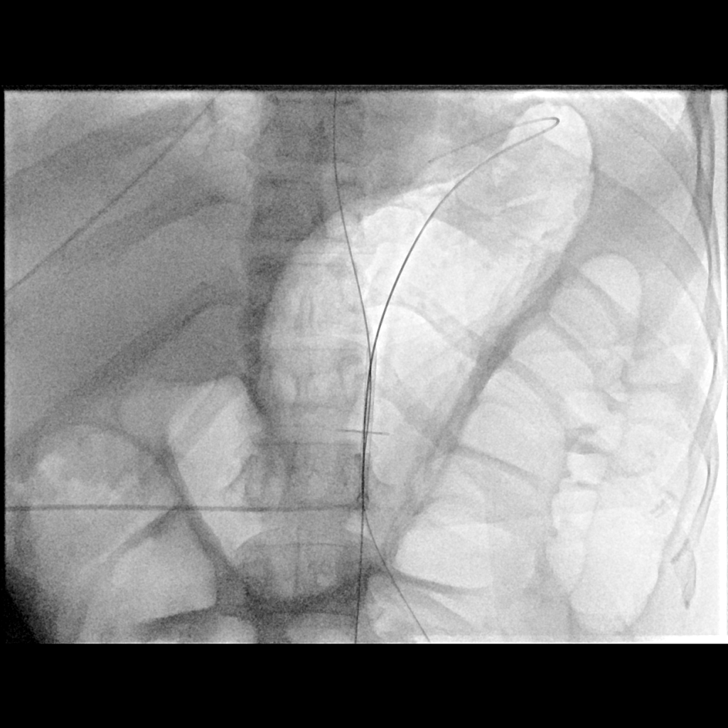

[Series 5: fl (-) angio · 8 of 8 slices shown (5 of 6)]
[im 1/8]
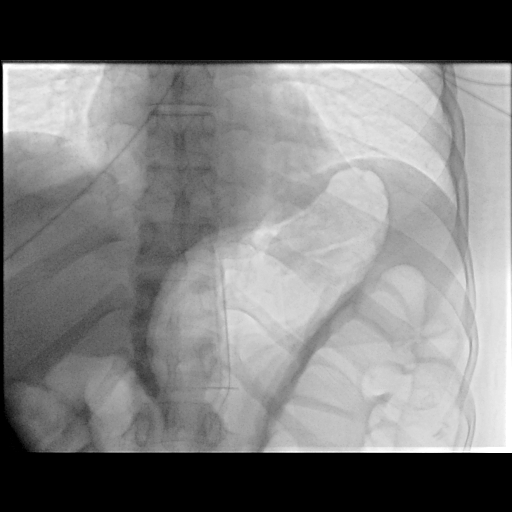
[im 2/8]
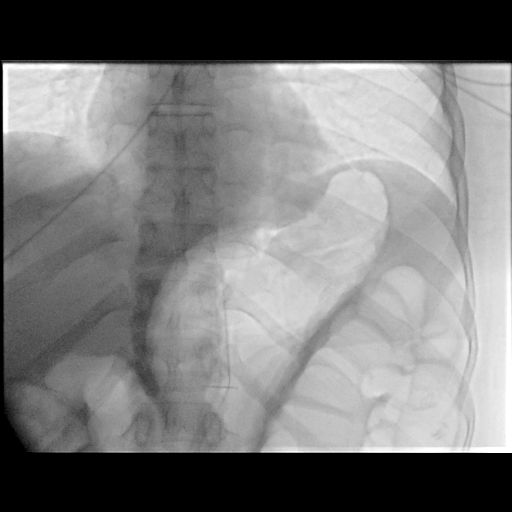
[im 3/8]
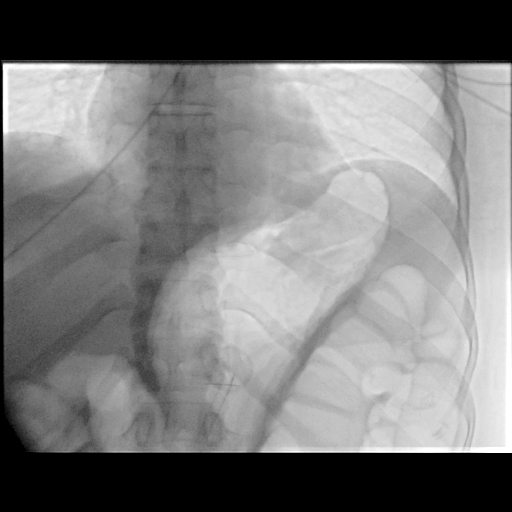
[im 4/8]
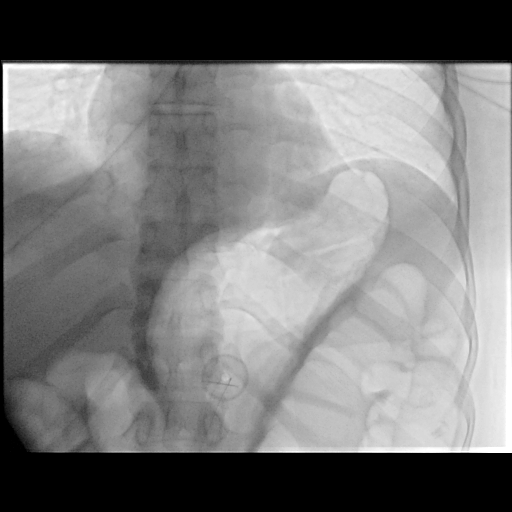
[im 5/8]
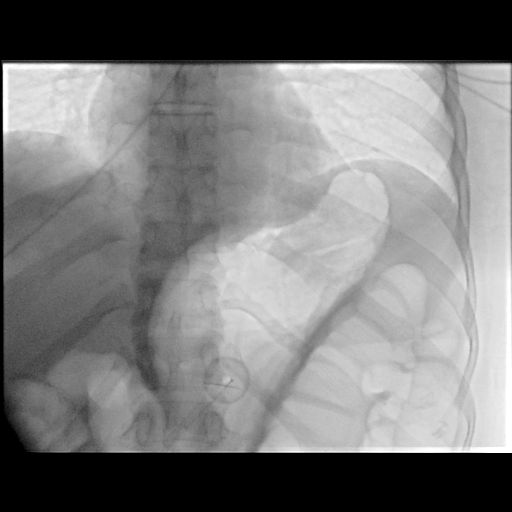
[im 6/8]
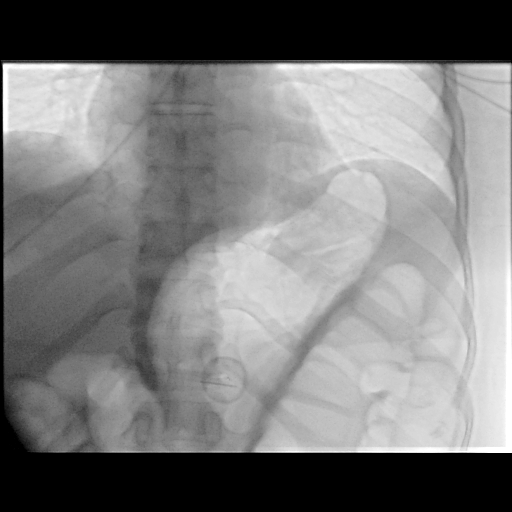
[im 7/8]
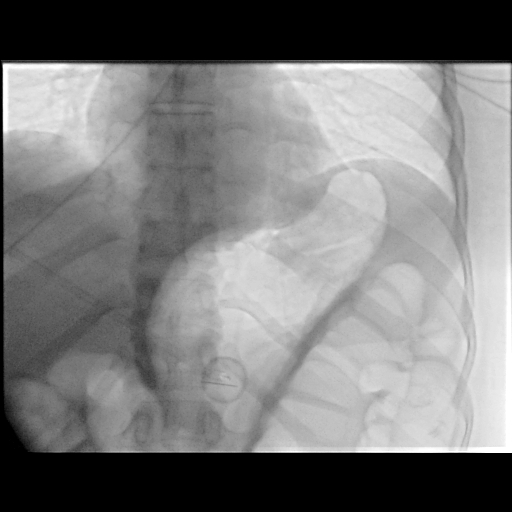
[im 8/8]
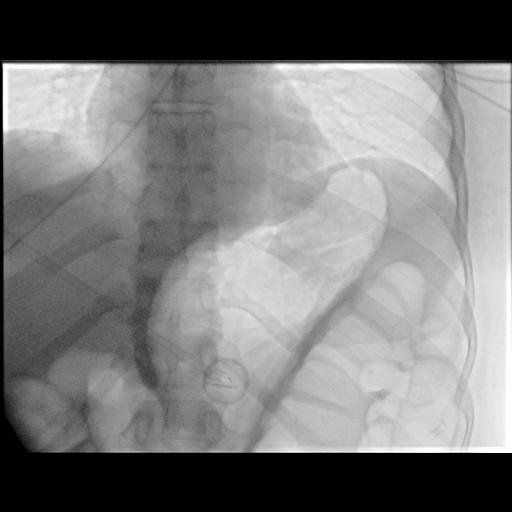

[Series 7: fl (-) angio · 1 of 1 slices shown (6 of 6)]
[im 1/1]
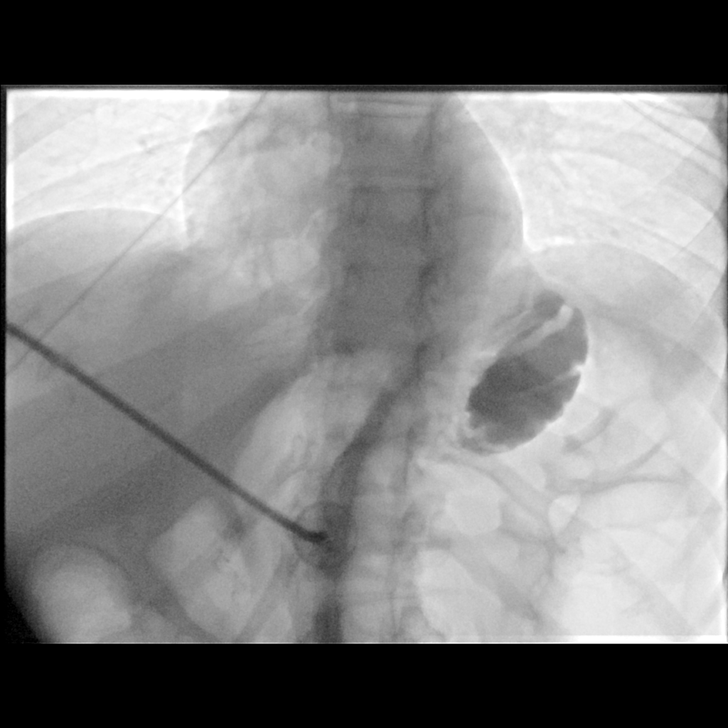

[13 of 13 positions shown; findings below may reference images not displayed]

MEDICATIONS:
Ancef 2 gm IV; Antibiotics were administered within 1 hour of the
procedure.

Glucagon 1 mg IV

CONTRAST:  20 mL of Omnipaque 300 administered into the gastric
lumen.

ANESTHESIA/SEDATION:
Moderate (conscious) sedation was employed during this procedure. A
total of Versed 1 mg and Fentanyl 50 mcg was administered
intravenously.

Moderate Sedation Time: 20 minutes. The patient's level of
consciousness and vital signs were monitored continuously by
radiology nursing throughout the procedure under my direct
supervision.

FLUOROSCOPY TIME:  1 minutes, 12 seconds (5 mGy)

COMPLICATIONS:
None immediate.

PROCEDURE:
Informed written consent was obtained from the patient's family
following explanation of the procedure, risks, benefits and
alternatives. A time out was performed prior to the initiation of
the procedure. Ultrasound scanning was performed to demarcate the
edge of the left lobe of the liver. Maximal barrier sterile
technique utilized including caps, mask, sterile gowns, sterile
gloves, large sterile drape, hand hygiene and Betadine prep.

The left upper quadrant was sterilely prepped and draped. An oral
gastric catheter was inserted into the stomach under fluoroscopy.
The existing nasogastric feeding tube was removed. The left costal
margin and air opacified transverse colon were identified and
avoided. Air was injected into the stomach for insufflation and
visualization under fluoroscopy. Under sterile conditions a 17 gauge
trocar needle was utilized to access the stomach percutaneously
beneath the left subcostal margin after the overlying soft tissues
were anesthetized with 1% Lidocaine with epinephrine. Needle
position was confirmed within the stomach with aspiration of air and
injection of small amount of contrast. A single T tack was deployed
for gastropexy. Over an Amplatz guide wire, a 9-French sheath was
inserted into the stomach. A snare device was utilized to capture
the oral gastric catheter. The snare device was pulled retrograde
from the stomach up the esophagus and out the oropharynx. The
20-French pull-through gastrostomy was connected to the snare device
and pulled antegrade through the oropharynx down the esophagus into
the stomach and then through the percutaneous tract external to the
patient. The gastrostomy was assembled externally. Contrast
injection confirms position in the stomach. Several spot
radiographic images were obtained in various obliquities for
documentation. The patient tolerated procedure well without
immediate post procedural complication.
FINDINGS: After successful fluoroscopic guided placement, the gastrostomy tube
is appropriately positioned with internal disc against the ventral
aspect of the gastric lumen.
IMPRESSION: Successful fluoroscopic insertion of a 20-French pull-through
gastrostomy tube.

The gastrostomy may be used immediately for medication
administration and in 24 hrs for the initiation of feeds.

## 2020-04-04 MED ORDER — CEFAZOLIN SODIUM-DEXTROSE 2-4 GM/100ML-% IV SOLN: 2.0000 g | INTRAVENOUS | Status: AC

## 2020-04-04 MED ORDER — MIDAZOLAM HCL 2 MG/2ML IJ SOLN
INTRAMUSCULAR | Status: AC
  Filled 2020-04-04: qty 2

## 2020-04-04 MED ORDER — FENTANYL CITRATE (PF) 100 MCG/2ML IJ SOLN
INTRAMUSCULAR | Status: AC | PRN
  Administered 2020-04-04: 50 ug via INTRAVENOUS

## 2020-04-04 MED ORDER — DEXAMETHASONE SODIUM PHOSPHATE 4 MG/ML IJ SOLN
4.0000 mg | Freq: Two times a day (BID) | INTRAMUSCULAR | Status: AC
  Administered 2020-04-04 – 2020-04-05 (×2): 4 mg via INTRAVENOUS
  Filled 2020-04-04 (×2): qty 1

## 2020-04-04 MED ORDER — FENTANYL CITRATE (PF) 100 MCG/2ML IJ SOLN
INTRAMUSCULAR | Status: AC
  Filled 2020-04-04: qty 2

## 2020-04-04 MED ORDER — CEFAZOLIN SODIUM-DEXTROSE 2-4 GM/100ML-% IV SOLN
INTRAVENOUS | Status: AC
  Administered 2020-04-04: 2 g via INTRAVENOUS
  Filled 2020-04-04: qty 100

## 2020-04-04 MED ORDER — HEPARIN SODIUM (PORCINE) 5000 UNIT/ML IJ SOLN: 5000.0000 [IU] | Freq: Three times a day (TID) | INTRAMUSCULAR | Status: DC

## 2020-04-04 MED ORDER — LIDOCAINE HCL 1 % IJ SOLN
INTRAMUSCULAR | Status: AC
  Filled 2020-04-04: qty 20

## 2020-04-04 MED ORDER — MIDAZOLAM HCL 2 MG/2ML IJ SOLN
INTRAMUSCULAR | Status: AC | PRN
  Administered 2020-04-04: 1 mg via INTRAVENOUS

## 2020-04-04 MED ORDER — GLUCAGON HCL RDNA (DIAGNOSTIC) 1 MG IJ SOLR
INTRAMUSCULAR | Status: AC
  Filled 2020-04-04: qty 1

## 2020-04-04 MED ORDER — GLUCAGON HCL (RDNA) 1 MG IJ SOLR
INTRAMUSCULAR | Status: AC | PRN
  Administered 2020-04-04: 1 mg via INTRAVENOUS

## 2020-04-04 MED ORDER — HEPARIN SODIUM (PORCINE) 5000 UNIT/ML IJ SOLN
5000.0000 [IU] | Freq: Three times a day (TID) | INTRAMUSCULAR | Status: DC
  Administered 2020-04-04 – 2020-05-03 (×86): 5000 [IU] via SUBCUTANEOUS
  Filled 2020-04-04 (×85): qty 1

## 2020-04-04 MED ORDER — IOHEXOL 300 MG/ML  SOLN
50.0000 mL | Freq: Once | INTRAMUSCULAR | Status: AC | PRN
  Administered 2020-04-04: 20 mL

## 2020-04-04 NOTE — NC FL2 (Signed)
[place] MEDICAID FL2 LEVEL OF CARE SCREENING TOOL     IDENTIFICATION  Patient Name: Vickie Alvarado Birthdate: May 19, 1966 Sex: female Admission Date (Current Location): 03/20/2020  [facility] and [place] Number:  Herbalist and Address:  [facility], [place] [address] [place],  [address]      Provider Number: [id number]  Attending Physician Name and Address:  Hosie Poisson, MD  Relative Name and Phone Number:  [name] daughter 1 [phone]    Current Level of Care: Hospital Recommended Level of Care: [place] Prior Approval Number:    Date Approved/Denied:   PASRR Number:    Discharge Plan: SNF    Current Diagnoses: Patient Active Problem List   Diagnosis Date Noted  . Cerebral embolism with cerebral infarction 03/24/2020  . Essential hypertension 03/21/2020  . Hyperlipidemia 03/21/2020  . Anxiety 03/21/2020  . Bipolar disorder ([place]) 03/21/2020  . Weakness of extremity 03/20/2020  . Brain mass   . Brain lesion     Orientation RESPIRATION BLADDER Height & Weight      (unable to assess  unresponsive)  Tracheostomy, O2 (Trach collar on O2 8L) Incontinent Weight: 75 kg Height:     BEHAVIORAL SYMPTOMS/MOOD NEUROLOGICAL BOWEL NUTRITION STATUS   (none)  (No HX noted) Incontinent Feeding tube (NPO Gastrostomy tube with Tube feeds to start)  AMBULATORY STATUS COMMUNICATION OF NEEDS Skin   Total Care Does not communicate Other (Comment) (closed incision to left head)                       Personal Care Assistance Level of Assistance  Bathing, Feeding, Dressing, Total care Bathing Assistance: Maximum assistance Feeding assistance:  (NPO) Dressing Assistance: Maximum assistance Total Care Assistance: Maximum assistance   Functional Limitations Info  Sight, Hearing, Speech Sight Info:  (unable to assess) Hearing Info:  (unable to assess) Speech Info: Impaired (trach/ unresponsive)    SPECIAL CARE  FACTORS FREQUENCY  PT (By licensed PT), OT (By licensed OT)     PT Frequency: 5X OT Frequency: 5X            Contractures Contractures Info: Not present    Additional Factors Info  Code Status, Allergies, Psychotropic, Insulin Sliding Scale, Isolation Precautions, Suctioning Needs Code Status Info: Full Allergies Info: Ibuprofen Psychotropic Info: Hx Bipolar & anxiety Insulin Sliding Scale Info: see d/c summary for sliding scale info Isolation Precautions Info: none     Current Medications (04/04/2020):  This is the current hospital active medication list Current Facility-Administered Medications  Medication Dose Route Frequency Provider Last Rate Last Admin  . acetaminophen (TYLENOL) 160 MG/5ML solution 650 mg  650 mg Per Tube Q4H PRN Hosie Poisson, MD   650 mg at 03/24/20 1559  . acetaminophen (TYLENOL) tablet 650 mg  650 mg Oral Q4H PRN Hosie Poisson, MD       Or  . acetaminophen (TYLENOL) suppository 650 mg  650 mg Rectal Q4H PRN Hosie Poisson, MD      . amLODipine (NORVASC) tablet 5 mg  5 mg Per Tube Daily Hosie Poisson, MD   5 mg at 04/01/20 0927  . cefTRIAXone (ROCEPHIN) 2 g in sodium chloride 0.9 % 100 mL IVPB  2 g Intravenous Q12H Hosie Poisson, MD 200 mL/hr at 04/04/20 0858 2 g at 04/04/20 0858  . chlorhexidine gluconate (MEDLINE KIT) (PERIDEX) 0.12 % solution 15 mL  15 mL Mouth Rinse BID Hosie Poisson, MD   15 mL  at 04/04/20 0920  . Chlorhexidine Gluconate Cloth 2 % PADS 6 each  6 each Topical Daily Hosie Poisson, MD   6 each at 04/04/20 1431  . dexamethasone (DECADRON) injection 4 mg  4 mg Intravenous Q12H Hosie Poisson, MD      . dextrose 5 % in lactated ringers infusion   Intravenous Continuous Hosie Poisson, MD 30 mL/hr at 04/04/20 1450 New Bag at 04/04/20 1450  . docusate (COLACE) 50 MG/5ML liquid 100 mg  100 mg Per Tube BID Hosie Poisson, MD   100 mg at 03/27/20 2133  . feeding supplement (JEVITY 1.2 CAL) liquid 1,000 mL  1,000 mL Per Tube Continuous Hosie Poisson,  MD   Stopped at 04/01/20 0016  . feeding supplement (PROSource TF) liquid 45 mL  45 mL Per Tube BID Hosie Poisson, MD   45 mL at 03/31/20 2141  . fentaNYL (SUBLIMAZE) 100 MCG/2ML injection           . fentaNYL (SUBLIMAZE) injection 50 mcg  50 mcg Intravenous Q15 min PRN Hosie Poisson, MD      . fentaNYL (SUBLIMAZE) injection 50-200 mcg  50-200 mcg Intravenous Q30 min PRN Hosie Poisson, MD      . glucagon (human recombinant) (GLUCAGEN) 1 MG injection           . heparin injection 5,000 Units  5,000 Units Subcutaneous Q8H Hosie Poisson, MD      . hydrALAZINE (APRESOLINE) injection 10-20 mg  10-20 mg Intravenous Q6H PRN Hosie Poisson, MD   20 mg at 03/27/20 1103  . HYDROcodone-acetaminophen (NORCO/VICODIN) 5-325 MG per tablet 1 tablet  1 tablet Oral Q4H PRN Hosie Poisson, MD      . insulin aspart (novoLOG) injection 0-20 Units  0-20 Units Subcutaneous Q4H Hosie Poisson, MD   4 Units at 04/04/20 1243  . insulin aspart (novoLOG) injection 3 Units  3 Units Subcutaneous Q4H Hosie Poisson, MD   3 Units at 03/31/20 2331  . insulin glargine (LANTUS) injection 20 Units  20 Units Subcutaneous Daily Hosie Poisson, MD   20 Units at 04/04/20 1241  . labetalol (NORMODYNE) injection 10-40 mg  10-40 mg Intravenous Q10 min PRN Hosie Poisson, MD   20 mg at 03/26/20 1204  . levETIRAcetam (KEPPRA) IVPB 500 mg/100 mL premix  500 mg Intravenous Q12H Hosie Poisson, MD 400 mL/hr at 04/04/20 0418 500 mg at 04/04/20 0418  . lidocaine (XYLOCAINE) 1 % (with pres) injection           . MEDLINE mouth rinse  15 mL Mouth Rinse 10 times per day Hosie Poisson, MD   15 mL at 04/04/20 1456  . midazolam (VERSED) 2 MG/2ML injection           . ondansetron (ZOFRAN) injection 4 mg  4 mg Intravenous Q6H PRN Hosie Poisson, MD   4 mg at 03/29/20 1656  . pantoprazole sodium (PROTONIX) 40 mg/20 mL oral suspension 40 mg  40 mg Per Tube QHS Hosie Poisson, MD   40 mg at 03/31/20 2141  . polyethylene glycol (MIRALAX / GLYCOLAX) packet 17 g  17 g  Oral Daily PRN Hosie Poisson, MD      . polyethylene glycol (MIRALAX / GLYCOLAX) packet 17 g  17 g Per Tube Daily Hosie Poisson, MD   17 g at 03/27/20 1053  . potassium chloride 20 MEQ/15ML (10%) solution 40 mEq  40 mEq Per Tube Daily Hosie Poisson, MD   40 mEq at 04/01/20 0927  . promethazine (PHENERGAN) tablet 12.5-25  mg  12.5-25 mg Oral Q4H PRN Hosie Poisson, MD      . sodium chloride flush (NS) 0.9 % injection 10-40 mL  10-40 mL Intracatheter Q12H Hosie Poisson, MD   10 mL at 04/04/20 0838  . sodium chloride flush (NS) 0.9 % injection 10-40 mL  10-40 mL Intracatheter PRN Hosie Poisson, MD         Discharge Medications: Please see discharge summary for a list of discharge medications.  Relevant Imaging Results:  Relevant Lab Results:   Additional Information SS#[id number]  Angelita Ingles, RN

## 2020-04-04 NOTE — TOC Initial Note (Addendum)
Transition of Care [facility]) - Initial/Assessment Note    Patient Details  Name: Vickie Alvarado MRN: [id number] Date of Birth: 1966-06-16  [facility]) CM/SW Contact:    Vickie Busing, RN Phone Number: [phone]  04/04/2020, 12:10 PM  Clinical Narrative:    CM consulted for placement LTACH vs SNF. CM contacted both [facility] and [facility]. Spoke with Vickie Alvarado at [facility] who states that the patient is not LTACH appropriate. Patient would be most appropriate for the SNF setting.  Spoke with Vickie Alvarado from [facility] who states that she will take a look at patients information but it sounds like she is not a candidate for LTACH..  CM spoke with daughter Vickie Alvarado to discuss options for disposition. Daughter gives verbal consent for CM to fax patients information out for possible bed offers and placement. CM will initiate SNF workup.     1548 FL2 initiated info to be updated when patient is more discharge appropriate. PASRR pending.             Expected Discharge Plan: Skilled Nursing Facility Barriers to Discharge: Continued Medical Work up   Patient Goals and CMS Choice Patient states their goals for this hospitalization and ongoing recovery are:: Patient unable to speak CMS Medicare.gov Compare Post Acute Care list provided to:: Other (Comment Required) (Daughter Vickie Alvarado) Choice offered to / list presented to : Adult Children  Expected Discharge Plan and Services Expected Discharge Plan: Skilled Nursing Facility In-house Referral: NA Discharge Planning Services: CM Consult   Living arrangements for the past 2 months: Single Family Home                 DME Arranged: N/A DME Agency: NA       HH Arranged: NA HH Agency: NA        Prior Living Arrangements/Services Living arrangements for the past 2 months: Single Family Home Lives with:: Adult Children   Do you feel safe going back to the place where you live?:  (Patient is unresponsive)      Need for Family  Participation in Patient Care: Yes (Comment) Care giver support system in place?: Yes (comment)   Criminal Activity/Legal Involvement Pertinent to Current Situation/Hospitalization: No - Comment as needed  Activities of Daily Living Home Assistive Devices/Equipment: None ADL Screening (condition at time of admission) Patient's cognitive ability adequate to safely complete daily activities?: Yes Is the patient deaf or have difficulty hearing?: No Does the patient have difficulty seeing, even when wearing glasses/contacts?: No Does the patient have difficulty concentrating, remembering, or making decisions?: No Patient able to express need for assistance with ADLs?: Yes Does the patient have difficulty dressing or bathing?: No Independently performs ADLs?: Yes (appropriate for developmental age) Does the patient have difficulty walking or climbing stairs?: No Weakness of Legs: None Weakness of Arms/Hands: None  Permission Sought/Granted   Permission granted to share information with : No              Emotional Assessment Appearance:: Appears older than stated age Attitude/Demeanor/Rapport: Unable to Assess Affect (typically observed): Unable to Assess Orientation: :  (unresponsive) Alcohol / Substance Use: Not Applicable Psych Involvement: No (comment)  Admission diagnosis:  Brain mass [G93.89] Brain lesion [G93.9] Weakness of extremity [R29.898] Patient Active Problem List   Diagnosis Date Noted  . Cerebral embolism with cerebral infarction 03/24/2020  . Essential hypertension 03/21/2020  . Hyperlipidemia 03/21/2020  . Anxiety 03/21/2020  . Bipolar disorder ([facility]) 03/21/2020  . Weakness of extremity 03/20/2020  .  Brain mass   . Brain lesion    PCP:  [facility]:   [facility] [address] [place]), [place] - [address] [place]) [place] [address] Phone: [phone] Fax: [phone]     Social  Determinants of Health (SDOH) Interventions    Readmission Risk Interventions No flowsheet data found.

## 2020-04-04 NOTE — Plan of Care (Signed)

## 2020-04-04 NOTE — Progress Notes (Signed)
PROGRESS NOTE    Vickie Alvarado  HYW:[id number] DOB: April 28, 1966 DOA: 03/20/2020 PCP: [facility]    No chief complaint on file.   Brief Narrative:   54 year old lady with prior history of hypertension, hyperlipidemia, COVID-19 infection diagnosed on 03/01/2020, dental infection and sinus infections presented with right-sided weakness, dysarthria and right facial droop was found to have abscess in the left basal ganglion.  Patient underwent left frontal stereotactic biopsy and aspiration of the abscess by neurosurgery on 03/22/2020 and transferred to ICU.  Patient was initially consulted by PCCM.  Hospital course was complicated by Streptococcus intermedius bacteremia, ID consulted and she is currently on Rocephin to complete the course.  Her course was complicated by acute hypoxic respiratory failure requiring mechanical ventilation, failure to extubate underwent percutaneous tracheostomy and patient currently remains on trach collar and is tolerating well on 5 L of oxygen.  Patient was transferred to [facility] on 04/03/2020.  Neurosurgery on board, recommended to complete the course of rocephin, continue with keppra and wean decadron and repeat MRI in 1 to 2 weeks.  Pt is scheduled for IR PEG today.    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 No change in her neurological exam.  Continue with IV rocephin for strep intermedius brain abscess. Discussed with Dr Linus Salmons with ID, recommend 8 weeks of IV Rocephin. 03/22/20 as Day 1 of antibiotics.   May 17, 2020 will be last day of IV Rocephin.    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  No change in her neurological exam.  S/p trach on 04/01/20 and she Is scheduled for PEG on 04/04/2020   Acute hypoxic respiratory failure s/p intubation, failed extubation and tracheostomy done Patient currently is on trach collar on 5 L of oxygen and is tolerating well. Pulmonary hygiene.    Essential hypertension Blood pressure parameters are optimal, SBP goal of less than 160 mmHg. Continue with amlodipine.    Diabetes mellitus, poorly controlled with hyperglycemia CBG (last 3)  Recent Labs    04/03/20 2315 04/04/20 0330 04/04/20 0755  GLUCAP 109* 135* 106*   Continue with sliding scale insulin. lantus 20 units daily and 3 units every 4 hours as pt is on tube feeds.    Nutrition  Currently has Cortrak and has Jevity running at 65 ml/hr.  She is scheduled for PEG placement today.   H/p Bipolar disorder:  No agitation seen.   Leukocytosis.  Improving.  Repeat labs.    Mild anemia of chronic disease:  Check labs today.     DVT prophylaxis: Heparin Code Status: Full code Family Communication: None at bedside Disposition:   Status is: Inpatient  Remains inpatient appropriate because:Altered mental status, Unsafe d/c plan, IV treatments appropriate due to intensity of illness or inability to take PO and Inpatient level of care appropriate due to severity of illness   Dispo: The patient is from: Home  Anticipated d/c is to: Pending SNF? LTAC?               Anticipated d/c date is: > 3 days              Patient currently is not medically stable to d/c.       Consultants:   Neurosurgery  PCCM  Procedures:  8/27> L frontal stereotactic biopsy/aspiration of L basal  ganglia mass   DIAGNOSTIC BRONCHOSCOPY BY DR Tamala Julian on 04/01/20 Percutaneous Trach placement on 04/01/2020   Micro Data:  8/26 BC x2> NGTD 8/27 Gram Stain> sent 8/27 Aerobic/Anaerobe> strep intermedius, pan sensitive  8/27 Fungus Culture with stain> Sent    Antimicrobials:  8/27 Cefepime>> 8/27 8/27 Vanc>> 8/29 8/27 Metro>> 8/29 8/27 Ceftriaxone>>   Subjective: She appears comfortable on trach collar .   Objective: Vitals:   04/04/20 0331 04/04/20 0500 04/04/20 0905 04/04/20 1020  BP: 127/80 122/67 120/68 120/77  Pulse: 65  67 65  Resp: _0 Temp: 98.3 F (36.8 C)     TempSrc: Axillary     SpO2: 98% 99%  100%  Weight:        Intake/Output Summary (Last 24 hours) at 04/04/2020 1034 Last data filed at 04/04/2020 0500 Gross per 24 hour  Intake 369 ml  Output 700 ml  Net -331 ml   Filed Weights   04/03/20 0103 04/03/20 2219 04/04/20 0118  Weight: 87.3 kg 75 kg 75 kg    Examination:  General exam: Ill looking female, unresponsive on trach and cortrak.  Respiratory system: diminished air entry at bases, no wheezing heard. No tachypnea.  Cardiovascular system: S1S2 heard, RRR no JVD.  Gastrointestinal system: Abdomen is soft, non tender non distended, bowel sounds wnl.  Central nervous system: not responding ,  opened eyes spontaneously, not following commands.  Extremities: on contracture ankle boots, no pedal edema.  Skin: No rashes seen.  Psychiatry: Cannot be assessed    Data Reviewed: I have personally reviewed following labs and imaging studies  CBC: Recent Labs  Lab 03/29/20 0515 03/31/20 1016 04/01/20 0620  WBC 17.4* 19.4* 17.8*  NEUTROABS  --   --  14.7*  HGB 11.2* 11.6* 11.2*  HCT 35.1* 35.9* 35.1*  MCV 92.9 91.8 92.1  PLT 230 237 389    Basic Metabolic Panel: Recent Labs  Lab 03/29/20 0515 03/31/20 0500 04/01/20 0620  NA 140 138 139  K 3.7 3.8 3.8  CL 105 103 103  CO2 _1 GLUCOSE 254* 245* 144*  BUN _2 CREATININE 0.41* 0.40* 0.35*  CALCIUM 8.6* 8.5* 8.4*  MG 2.1  --   --   PHOS 3.7  --   --     GFR: Estimated Creatinine Clearance: 84 mL/min (A) (by C-G formula based on SCr of 0.35 mg/dL (L)).  Liver Function Tests: No results for input(s): AST, ALT, ALKPHOS, BILITOT, PROT, ALBUMIN in the last 168 hours.  CBG: Recent Labs  Lab 04/03/20 1607 04/03/20 1953 04/03/20 2315 04/04/20 0330 04/04/20 0755  GLUCAP 109* 121* 109* 135* 106*     No results found for this or any previous visit (from the past 240 hour(s)).       Radiology Studies: No results found.      Scheduled Meds: . amLODipine  5 mg Per Tube Daily  . chlorhexidine gluconate (MEDLINE KIT)  15 mL Mouth Rinse BID  . Chlorhexidine Gluconate Cloth  6 each Topical Daily  .  docusate  100 mg Per Tube BID  . feeding supplement (PROSource TF)  45 mL Per Tube BID  . fentaNYL      . glucagon (human recombinant)      . insulin aspart  0-20 Units Subcutaneous Q4H  . insulin aspart  3 Units Subcutaneous Q4H  . insulin glargine  20 Units Subcutaneous Daily  . lidocaine      . mouth rinse  15 mL Mouth Rinse 10 times per day  . midazolam      . pantoprazole sodium  40 mg Per Tube QHS  . polyethylene glycol  17 g Per Tube Daily  . potassium chloride  40 mEq Per Tube Daily  . sodium chloride flush  10-40 mL Intracatheter Q12H   Continuous Infusions: .  ceFAZolin (ANCEF) IV 2 g (04/04/20 1023)  . cefTRIAXone (ROCEPHIN)  IV 2 g (04/04/20 0858)  . dextrose 5% lactated ringers 30 mL/hr at 04/03/20 0429  . feeding supplement (JEVITY 1.2 CAL) Stopped (04/01/20 0016)  . levETIRAcetam 500 mg (04/04/20 0418)     LOS: 15 days       Hosie Poisson, MD [facility]   To contact the attending provider between 7A-7P or the covering provider during after hours 7P-7A, please log into the web site [email or web address] and access using universal [place] password for that web site. If you do not have the password, please  call the hospital operator.  04/04/2020, 10:34 AM

## 2020-04-04 NOTE — Procedures (Signed)
Pre procedure Dx: Dysphagia Post Procedure Dx: Same  Successful fluoroscopic guided insertion of gastrostomy tube.   The gastrostomy tube may be used immediately for medications.   Tube feeds may be initiated in 24 hours as per the primary team.    EBL: Minimal  Complications: None immediate  Jay Sanna Porcaro, MD Pager #: [phone]    

## 2020-04-05 LAB — GLUCOSE, CAPILLARY
Glucose-Capillary: 117 mg/dL — ABNORMAL HIGH (ref 70–99)
Glucose-Capillary: 126 mg/dL — ABNORMAL HIGH (ref 70–99)
Glucose-Capillary: 165 mg/dL — ABNORMAL HIGH (ref 70–99)
Glucose-Capillary: 171 mg/dL — ABNORMAL HIGH (ref 70–99)
Glucose-Capillary: 82 mg/dL (ref 70–99)
Glucose-Capillary: 89 mg/dL (ref 70–99)

## 2020-04-05 MED ORDER — DEXAMETHASONE SODIUM PHOSPHATE 4 MG/ML IJ SOLN
4.0000 mg | INTRAMUSCULAR | Status: AC
  Administered 2020-04-05: 4 mg via INTRAVENOUS
  Filled 2020-04-05: qty 1

## 2020-04-05 NOTE — Progress Notes (Signed)
PROGRESS NOTE    Vickie Alvarado  ION:[id number] DOB: 1965/08/16 DOA: 03/20/2020 PCP: [facility]    No chief complaint on file.   Brief Narrative:   54 year old lady with prior history of hypertension, hyperlipidemia, COVID-19 infection diagnosed on 03/01/2020, dental infection and sinus infections presented with right-sided weakness, dysarthria and right facial droop was found to have abscess in the left basal ganglion.  Patient underwent left frontal stereotactic biopsy and aspiration of the abscess by neurosurgery on 03/22/2020 and transferred to ICU.  Patient was initially consulted by PCCM.  Hospital course was complicated by Streptococcus intermedius bacteremia, ID consulted and she is currently on Rocephin to complete the course.  Her course was complicated by acute hypoxic respiratory failure requiring mechanical ventilation, failure to extubate underwent percutaneous tracheostomy and patient currently remains on trach collar and is tolerating well on 5 L of oxygen.  Patient was transferred to [facility] on 04/03/2020.  Neurosurgery on board, recommended to complete the course of rocephin, continue with keppra and wean decadron and repeat MRI in 1 to 2 weeks. She underwent PEG placement on 04/04/2020. Tube feeds were restarted.  Pt seen and examined at bedside.    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 No change in neurological exam today.  Continue with IV rocephin for strep intermedius brain abscess. Discussed with Dr Linus Salmons with ID,  recommend 8 weeks of IV Rocephin. 03/22/20 as Day 1 of antibiotics.  May 17, 2020 will be last day of IV Rocephin.  No new complaints.    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  No change in her neurological exam.  S/p trach on 04/01/20 and she underwent PEG placement on 04/04/2020. Tube feeds were restarted today.    Acute hypoxic respiratory failure s/p intubation, failed extubation and tracheostomy done Patient currently is on trach collar on 5 L of oxygen and is tolerating well. Pulmonary hygiene. Will need to change to cuffless prior to discharge.     Essential hypertension Blood pressure parameters are well controlled. SBP goal of less than 160 mmHg. Continue with amlodipine.    Diabetes mellitus, poorly controlled with hyperglycemia CBG (last 3)  Recent Labs    04/05/20 0814 04/05/20 1225 04/05/20 1631  GLUCAP 89 117* 126*   Continue with sliding scale insulin. lantus 20 units daily and 3 units every 4 hours as pt is on tube feeds.  No changes in meds.    Nutrition  Currently has Cortrak and has Jevity running at 65 ml/hr.  Underwent PEG placement.   H/p Bipolar disorder:  No agitation seen.   Leukocytosis.  Improving.  Repeat labs.    Mild anemia of chronic disease:  Check labs today.     DVT prophylaxis: Heparin Code Status: Full code Family Communication: None at bedside, discussed with daughter over the phone.  Disposition:   Status is: Inpatient  Remains inpatient appropriate because:Altered mental status, Unsafe d/c plan, IV treatments  appropriate due to intensity of illness or inability to take PO and Inpatient level of care appropriate due to severity of illness   Dispo: The patient is from: Home              Anticipated d/c is to: Pending SNF? LTAC?                Anticipated d/c date is: > 3 days              Patient currently is not medically stable to d/c.       Consultants:   Neurosurgery  PCCM  Procedures:  8/27> L frontal stereotactic biopsy/aspiration of L basal ganglia mass   DIAGNOSTIC BRONCHOSCOPY BY DR Tamala Julian on 04/01/20 Percutaneous Trach placement on 04/01/2020   Micro Data:  8/26 BC x2> NGTD 8/27 Gram Stain> sent 8/27 Aerobic/Anaerobe> strep intermedius, pan sensitive  8/27 Fungus Culture with stain> Sent    Antimicrobials:  8/27 Cefepime>> 8/27 8/27 Vanc>> 8/29 8/27 Metro>> 8/29 8/27 Ceftriaxone>>   Subjective: Appears comfortable. No new complaints.   Objective: Vitals:   04/05/20 0812 04/05/20 1147 04/05/20 1223 04/05/20 1629  BP:      Pulse:  74    Resp:  (!) 28  14  Temp: 99 F (37.2 C)  98 F (36.7 C) 98.3 F (36.8 C)  TempSrc:      SpO2:  100%  99%  Weight:        Intake/Output Summary (Last 24 hours) at 04/05/2020 1810 Last data filed at 04/05/2020 1659 Gross per 24 hour  Intake 939.18 ml  Output 1350 ml  Net -410.82 ml   Filed Weights   04/03/20 2219 04/04/20 0118 04/05/20 0355  Weight: 75 kg 75 kg 71.2 kg    Examination:  General exam: ill looking lady , on trach and appears comfortable.  Respiratory system: Diminished air entry at bases, no tachypnea.  Cardiovascular system: S1S2 heard. RRR, no JVD.  Gastrointestinal system: Abdomen is soft, non tender non distended bowel sounds wnl.  Central nervous system: unresponsive, not following commands.  Extremities: on contracture ankle boots, No pedal edema.  Skin: No rashes seen.  Psychiatry: Cannot be assessed.    Data Reviewed: I have personally reviewed following labs and imaging studies  CBC: Recent Labs  Lab 03/31/20 1016 04/01/20 0620 04/04/20 1446  WBC 19.4* 17.8* 10.7*  NEUTROABS  --  14.7* 8.9*  HGB 11.6* 11.2* 11.6*  HCT 35.9* 35.1* 35.8*  MCV 91.8 92.1 91.3  PLT 237 232 188    Basic Metabolic  Panel: Recent Labs  Lab 03/31/20 0500 04/01/20 0620 04/04/20 1530  NA 138 139 134*  K 3.8 3.8 3.8  CL 103 103 103  CO2 25 27 22   GLUCOSE 245* 144* 208*  BUN 17 16 15   CREATININE 0.40* 0.35* 0.34*  CALCIUM 8.5* 8.4* 8.2*    GFR: Estimated Creatinine Clearance: 77.3 mL/min (A) (by C-G formula based on SCr of 0.34 mg/dL (L)).  Liver Function Tests: Recent Labs  Lab 04/04/20 1530  AST 16  ALT 48*  ALKPHOS 67  BILITOT 0.3  PROT 5.3*  ALBUMIN 2.6*    CBG: Recent Labs  Lab 04/04/20 2341 04/05/20 0353 04/05/20 0814 04/05/20 1225 04/05/20 1631  GLUCAP 85 82 89 117* 126*     No results found for this or any previous visit (from the past 240 hour(s)).       Radiology Studies: IR GASTROSTOMY TUBE MOD SED  Result Date: 04/04/2020 INDICATION: Dysphagia.  Please perform percutaneous gastrostomy tube for enteric nutrition supplementation purposes. EXAM: PULL TROUGH GASTROSTOMY TUBE PLACEMENT COMPARISON:  CT abdomen and pelvis-03/20/2020 MEDICATIONS: Ancef 2 gm IV; Antibiotics were administered within 1 hour of the procedure. Glucagon 1 mg IV CONTRAST:  20 mL of Omnipaque 300 administered into the gastric lumen. ANESTHESIA/SEDATION: Moderate (conscious) sedation was employed during this procedure. A total of Versed 1 mg and Fentanyl 50 mcg was administered intravenously. Moderate Sedation Time: 20 minutes. The patient's level of consciousness and vital signs were monitored continuously by radiology nursing throughout the procedure under my direct supervision. FLUOROSCOPY TIME:  1 minutes, 12 seconds (5 mGy) COMPLICATIONS: None immediate. PROCEDURE: Informed written consent was obtained from the patient's family following explanation of the procedure, risks, benefits and alternatives. A time out was performed prior to the initiation of the procedure. Ultrasound scanning was performed to demarcate the edge of the left lobe of the liver. Maximal barrier sterile technique utilized  including caps, mask, sterile gowns, sterile gloves, large sterile drape, hand hygiene and Betadine prep. The left upper quadrant was sterilely prepped and draped. An oral gastric catheter was inserted into the stomach under fluoroscopy. The existing nasogastric feeding tube was removed. The left costal margin and air opacified transverse colon were identified and avoided. Air was injected into the stomach for insufflation and visualization under fluoroscopy. Under sterile conditions a 17 gauge trocar needle was utilized to access the stomach percutaneously beneath the left subcostal margin after the overlying soft tissues were anesthetized with 1% Lidocaine with epinephrine. Needle position was confirmed within the stomach with aspiration of air and injection of small amount of contrast. A single T tack was deployed for gastropexy. Over an Amplatz guide wire, a 9-French sheath was inserted into the stomach. A snare device was utilized to capture the oral gastric catheter. The snare device was pulled retrograde from the stomach up the esophagus and out the oropharynx. The 20-French pull-through gastrostomy was connected to the snare device and pulled antegrade through the oropharynx down the esophagus into the stomach and then through the percutaneous tract external to the patient. The gastrostomy was assembled externally. Contrast injection confirms position in the stomach. Several spot radiographic images were obtained in various obliquities for documentation. The patient tolerated procedure well without immediate post procedural complication. FINDINGS: After successful fluoroscopic guided placement, the gastrostomy tube is appropriately positioned with internal disc against the ventral aspect of the gastric lumen. IMPRESSION: Successful fluoroscopic insertion of a 20-French pull-through gastrostomy tube. The gastrostomy may be used immediately for medication administration and in 24 hrs for the initiation of  feeds. Electronically Signed   By: Sandi Mariscal M.D.   On: 04/04/2020 14:48        Scheduled Meds: . amLODipine  5 mg Per Tube Daily  . chlorhexidine gluconate (MEDLINE KIT)  15 mL Mouth Rinse BID  . Chlorhexidine Gluconate Cloth  6 each Topical Daily  . docusate  100 mg Per Tube BID  . feeding supplement (PROSource TF)  45 mL Per Tube BID  . heparin injection (subcutaneous)  5,000 Units Subcutaneous Q8H  . insulin aspart  0-20 Units Subcutaneous Q4H  . insulin aspart  3 Units Subcutaneous Q4H  . insulin glargine  20 Units Subcutaneous Daily  . mouth rinse  15 mL Mouth Rinse 10 times per day  . pantoprazole sodium  40 mg Per Tube QHS  . polyethylene glycol  17 g Per Tube Daily  . potassium chloride  40 mEq Per Tube Daily  .  sodium chloride flush  10-40 mL Intracatheter Q12H   Continuous Infusions: . cefTRIAXone (ROCEPHIN)  IV 2 g (04/05/20 0954)  . dextrose 5% lactated ringers 30 mL/hr at 04/05/20 1136  . feeding supplement (JEVITY 1.2 CAL) 1,000 mL (04/05/20 1738)  . levETIRAcetam 500 mg (04/05/20 1659)     LOS: 16 days       Hosie Poisson, MD [facility]   To contact the attending provider between 7A-7P or the covering provider during after hours 7P-7A, please log into the web site [email or web address] and access using universal [place] password for that web site. If you do not have the password, please call the hospital operator.  04/05/2020, 6:10 PM

## 2020-04-05 NOTE — Progress Notes (Signed)
Nutrition Follow-up  RD working remotely.  DOCUMENTATION CODES:   Not applicable  INTERVENTION:  Resume tube feeds via PEG: - Jevity 1.2 @ 65 ml/hr (1560 ml/day) - ProSource TF 45 ml BID  Tube feeding regimen provides 1952 kcal, 108 grams of protein, and 1265 ml of H2O.   NUTRITION DIAGNOSIS:   Increased nutrient needs related to post-op healing as evidenced by estimated needs.  Ongoing  GOAL:   Patient will meet greater than or equal to 90% of their needs  Met via TF  MONITOR:   TF tolerance, I & O's  REASON FOR ASSESSMENT:   Ventilator, Consult Enteral/tube feeding initiation and management  ASSESSMENT:   Patient with PMH significant for HTN, HLD, COVID 19 infection (03/01/20), and dental infections. Presents this admission with R sided hemiplegia and R sided facial droop. 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  8/27 - pt with abscess in L basal ganglia s/p L frontal biopsy/aspiration 9/06 - trach placed 9/09 - s/p PEG  RD received consult for tube feeding initiation and management. Pt is s/p PEG yesterday by IR. Pt remains on TC. PEG has been cleared for use by IR.  Discussed pt with RN. Can restart tube feeds as ordered.  Reviewed weights. Pt's weight has fluctuated between 69.2-88 kg since admission Question accuracy of some of the higher weights. Suspect dry weight is closer to 70 kg. Per RN edema assessment, pt currently with +1 pitting edema to BUE and +2 pitting edema to BLE.  Current TF orders (TF on for PEG placement hold): Jevity 1.2 @ 65 ml/hr, ProSource TF 45 ml BID  Medications reviewed and include: colace, SSI q 4 hours, Novolog 3 units q 4 hours, Lantus 20 units daily, protonix, miralax, potassium chloride 40 mEq daily, IV abx IVF: D5 in LR @ 30 ml/hr  Labs reviewed: sodium 134 CBG's: 82-95 x 24 hours  Diet Order:   Diet Order            Diet NPO time specified  Diet effective midnight                  EDUCATION NEEDS:   Not appropriate for education at this time  Skin:  Skin Assessment: Skin Integrity Issues: Incisions: L head  Last BM:  04/04/20 medium type 7  Height:   Ht Readings from Last 1 Encounters:  03/01/20 5' 7"  (1.702 m)    Weight:   Wt Readings from Last 1 Encounters:  04/05/20 71.2 kg    BMI:  Body mass index is 24.58 kg/m.  Estimated Nutritional Needs:   Kcal:  1700-2000  Protein:  85-105 grams  Fluid:  >/= 1.7 L/day    Gaynell Face, MS, RD, LDN Inpatient Clinical Dietitian Please see AM[facility] for contact information.

## 2020-04-05 NOTE — Progress Notes (Signed)
S/p perc G-tube yesterday. No complications. Daughter at bedside BP 132/85 (BP Location: Left Arm)   Pulse 76   Temp 99 F (37.2 C)   Resp 16   Wt 71.2 kg   SpO2 100%   BMI 24.58 kg/m  Site clean, NT.  Ok to begin using for TF needs.   Brayton El PA-C Interventional Radiology 04/05/2020 11:24 AM

## 2020-04-06 LAB — GLUCOSE, CAPILLARY
Glucose-Capillary: 144 mg/dL — ABNORMAL HIGH (ref 70–99)
Glucose-Capillary: 144 mg/dL — ABNORMAL HIGH (ref 70–99)
Glucose-Capillary: 154 mg/dL — ABNORMAL HIGH (ref 70–99)
Glucose-Capillary: 155 mg/dL — ABNORMAL HIGH (ref 70–99)
Glucose-Capillary: 179 mg/dL — ABNORMAL HIGH (ref 70–99)
Glucose-Capillary: 185 mg/dL — ABNORMAL HIGH (ref 70–99)

## 2020-04-06 MED ORDER — PROMETHAZINE HCL 25 MG PO TABS: 12.5000 mg | ORAL_TABLET | ORAL | Status: DC | PRN

## 2020-04-06 MED ORDER — POLYETHYLENE GLYCOL 3350 17 G PO PACK
17.0000 g | PACK | Freq: Every day | ORAL | Status: DC | PRN
  Administered 2020-04-12: 17 g

## 2020-04-06 MED ORDER — HYDROCODONE-ACETAMINOPHEN 5-325 MG PO TABS
1.0000 | ORAL_TABLET | ORAL | Status: DC | PRN
  Administered 2020-04-17: 1
  Filled 2020-04-06: qty 1

## 2020-04-06 NOTE — Progress Notes (Signed)
Pt was grimacing and crying . Checked vitals , IV sites , administered pain med. Seems comfortable now.

## 2020-04-06 NOTE — Progress Notes (Signed)
PROGRESS NOTE    Vickie Alvarado  VGJ:[id number] DOB: [birth year] DOA: 03/20/2020 PCP: [facility]    No chief complaint on file.   Brief Narrative:   54 year old lady with prior history of hypertension, hyperlipidemia, COVID-19 infection diagnosed on 03/01/2020, dental infection and sinus infections presented with right-sided weakness, dysarthria and right facial droop was found to have abscess in the left basal ganglion.  Patient underwent left frontal stereotactic biopsy and aspiration of the abscess by neurosurgery on 03/22/2020 and transferred to ICU.  Patient was initially consulted by PCCM.  Hospital course was complicated by Streptococcus intermedius bacteremia, ID consulted and she is currently on Rocephin to complete the course.  Her course was complicated by acute hypoxic respiratory failure requiring mechanical ventilation, failure to extubate underwent percutaneous tracheostomy and patient currently remains on trach collar and is tolerating well on 5 L of oxygen.  Patient was transferred to [facility] on 04/03/2020.  Neurosurgery on board, recommended to complete the course of rocephin, continue with keppra and wean decadron and repeat MRI in 1 to 2 weeks. She underwent PEG placement on 04/04/2020. Tube feeds were restarted.  Pt seen and examined at bedside. No new complaints today.     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 No change in neurological exam today.  Continue with IV rocephin for strep intermedius brain abscess.  Discussed with Dr Linus Salmons with ID, recommend 8 weeks of IV Rocephin. 03/22/20 as Day 1 of antibiotics.  May 17, 2020 will be last day of IV Rocephin.  No new complaints.    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  No change in her neurological exam.  S/p trach on 04/01/20 and she underwent PEG placement on 04/04/2020. Tube feeds were restarted . No new complaints.    Acute hypoxic respiratory failure s/p intubation, failed extubation and tracheostomy done Patient currently is on trach collar on 5 L of oxygen and is tolerating well. Continue with pulm hygiene.  Will need to change to cuffless prior to discharge.     Essential hypertension BP well controlled. SBP goal of less than 160 mmHg. Continue with amlodipine.    Diabetes mellitus, poorly controlled with hyperglycemia CBG (last 3)  Recent Labs    04/06/20 0754 04/06/20 1228 04/06/20 1611  GLUCAP 154* 185* 155*   Continue with sliding scale insulin. lantus 20 units daily and 3 units every 4 hours as pt is on tube feeds.  No changes in meds.    Nutrition  Currently has Cortrak and has Jevity running at 65 ml/hr.  Underwent PEG placement.   H/p Bipolar disorder:  No agitation seen.   Leukocytosis.  Improving. Repeat labs in am.     Mild anemia of chronic disease:  Hemoglobin of 11.     DVT prophylaxis: Heparin Code Status: Full code Family Communication: None at bedside, discussed with daughter over the phone.  Disposition:   Status is: Inpatient  Remains  inpatient appropriate because:Altered mental status, Unsafe d/c plan, IV treatments appropriate due to intensity of illness or inability to take PO and Inpatient level of care appropriate due to severity of illness   Dispo: The patient is from: Home              Anticipated  d/c is to: Pending SNF? LTAC?               Anticipated d/c date is: > 3 days              Patient currently is not medically stable to d/c.       Consultants:   Neurosurgery  PCCM  Procedures:  8/27> L frontal stereotactic biopsy/aspiration of L basal ganglia mass   DIAGNOSTIC BRONCHOSCOPY BY DR Tamala Julian on 04/01/20 Percutaneous Trach placement on 04/01/2020   Micro Data:  8/26 BC x2> NGTD 8/27 Gram Stain> sent 8/27 Aerobic/Anaerobe> strep intermedius, pan sensitive  8/27 Fungus Culture with stain> Sent    Antimicrobials:  8/27 Cefepime>> 8/27 8/27 Vanc>> 8/29 8/27 Metro>> 8/29 8/27 Ceftriaxone>>   Subjective: No new complaints.   Objective: Vitals:   04/06/20 0856 04/06/20 0909 04/06/20 1233 04/06/20 1533  BP: 117/69 117/69    Pulse: 73 73    Resp: 15 16    Temp:   98.9 F (37.2 C) 98.7 F (37.1 C)  TempSrc:      SpO2: 100% 100%    Weight:      Height:        Intake/Output Summary (Last 24 hours) at 04/06/2020 1847 Last data filed at 04/06/2020 1822 Gross per 24 hour  Intake 575 ml  Output 950 ml  Net -375 ml   Filed Weights   04/04/20 0118 04/05/20 0355 04/06/20 0401  Weight: 75 kg 71.2 kg 73.2 kg    Examination:  General exam: Ill looking lady, on trach,   Respiratory system: diminished at bases, no wheezing or rhonchi.   Cardiovascular system: S1S2 heard, RRR no JVD, no pedal edema.  Gastrointestinal system: Abdomen is soft, NT ND BS+PEG in place.  Central nervous system: unresponsive , not following commands.  Extremities: on contracture ankle boots, No pedal edema.  Skin: No rashes seen.  Psychiatry: Cannot be assessed.    Data Reviewed: I have personally reviewed following labs and imaging studies  CBC: Recent Labs  Lab 03/31/20 1016 04/01/20 0620 04/04/20 1446  WBC 19.4* 17.8* 10.7*  NEUTROABS  --  14.7* 8.9*  HGB 11.6* 11.2* 11.6*  HCT 35.9* 35.1* 35.8*  MCV 91.8 92.1 91.3  PLT 237 232 542    Basic Metabolic  Panel: Recent Labs  Lab 03/31/20 0500 04/01/20 0620 04/04/20 1530  NA 138 139 134*  K 3.8 3.8 3.8  CL 103 103 103  CO2 25 27 22   GLUCOSE 245* 144* 208*  BUN 17 16 15   CREATININE 0.40* 0.35* 0.34*  CALCIUM 8.5* 8.4* 8.2*    GFR: Estimated Creatinine Clearance: 77.3 mL/min (A) (by C-G formula based on SCr of 0.34 mg/dL (L)).  Liver Function Tests: Recent Labs  Lab 04/04/20 1530  AST 16  ALT 48*  ALKPHOS 67  BILITOT 0.3  PROT 5.3*  ALBUMIN 2.6*    CBG: Recent Labs  Lab 04/05/20 2334 04/06/20 0347 04/06/20 0754 04/06/20 1228 04/06/20 1611  GLUCAP 171* 179* 154* 185* 155*     No results found for this or any previous visit (from the past 240 hour(s)).       Radiology Studies: No  results found.      Scheduled Meds: . amLODipine  5 mg Per Tube Daily  . chlorhexidine gluconate (MEDLINE KIT)  15 mL Mouth Rinse BID  . Chlorhexidine Gluconate Cloth  6 each Topical Daily  . docusate  100 mg Per Tube BID  . feeding supplement (PROSource TF)  45 mL Per Tube BID  . heparin injection (subcutaneous)  5,000 Units Subcutaneous Q8H  . insulin aspart  0-20 Units Subcutaneous Q4H  . insulin aspart  3 Units Subcutaneous Q4H  . insulin glargine  20 Units Subcutaneous Daily  . mouth rinse  15 mL Mouth Rinse 10 times per day  . pantoprazole sodium  40 mg Per Tube QHS  . polyethylene glycol  17 g Per Tube Daily  . potassium chloride  40 mEq Per Tube Daily  . sodium chloride flush  10-40 mL Intracatheter Q12H   Continuous Infusions: . cefTRIAXone (ROCEPHIN)  IV Stopped (04/06/20 0855)  . dextrose 5% lactated ringers 30 mL/hr at 04/06/20 0347  . feeding supplement (JEVITY 1.2 CAL) 1,000 mL (04/06/20 0827)  . levETIRAcetam 500 mg (04/06/20 1614)     LOS: 17 days       Hosie Poisson, MD [facility]   To contact the attending provider between 7A-7P or the covering provider during after hours 7P-7A, please log into the web site [email or web address] and access  using universal [place] password for that web site. If you do not have the password, please call the hospital operator.  04/06/2020, 6:47 PM

## 2020-04-07 LAB — CBC
HCT: 30.4 % — ABNORMAL LOW (ref 36.0–46.0)
Hemoglobin: 10 g/dL — ABNORMAL LOW (ref 12.0–15.0)
MCH: 30.3 pg (ref 26.0–34.0)
MCHC: 32.9 g/dL (ref 30.0–36.0)
MCV: 92.1 fL (ref 80.0–100.0)
Platelets: 156 10*3/uL (ref 150–400)
RBC: 3.3 MIL/uL — ABNORMAL LOW (ref 3.87–5.11)
RDW: 17.2 % — ABNORMAL HIGH (ref 11.5–15.5)
WBC: 5.9 10*3/uL (ref 4.0–10.5)
nRBC: 0 % (ref 0.0–0.2)

## 2020-04-07 LAB — BASIC METABOLIC PANEL
Anion gap: 8 (ref 5–15)
BUN: 8 mg/dL (ref 6–20)
CO2: 23 mmol/L (ref 22–32)
Calcium: 7 mg/dL — ABNORMAL LOW (ref 8.9–10.3)
Chloride: 104 mmol/L (ref 98–111)
Creatinine, Ser: 0.31 mg/dL — ABNORMAL LOW (ref 0.44–1.00)
GFR calc Af Amer: >60 mL/min (ref >60)
GFR calc non Af Amer: >60 mL/min (ref >60)
Glucose, Bld: 168 mg/dL — ABNORMAL HIGH (ref 70–99)
Potassium: 3.3 mmol/L — ABNORMAL LOW (ref 3.5–5.1)
Sodium: 135 mmol/L (ref 135–145)

## 2020-04-07 LAB — GLUCOSE, CAPILLARY
Glucose-Capillary: 192 mg/dL — ABNORMAL HIGH (ref 70–99)
Glucose-Capillary: 164 mg/dL — ABNORMAL HIGH (ref 70–99)
Glucose-Capillary: 167 mg/dL — ABNORMAL HIGH (ref 70–99)
Glucose-Capillary: 142 mg/dL — ABNORMAL HIGH (ref 70–99)
Glucose-Capillary: 166 mg/dL — ABNORMAL HIGH (ref 70–99)

## 2020-04-07 LAB — MAGNESIUM: Magnesium: 1.5 mg/dL — ABNORMAL LOW (ref 1.7–2.4)

## 2020-04-07 MED ORDER — POTASSIUM CHLORIDE 20 MEQ/15ML (10%) PO SOLN
40.0000 meq | Freq: Two times a day (BID) | ORAL | Status: AC
  Administered 2020-04-07 – 2020-04-08 (×3): 40 meq
  Filled 2020-04-07 (×3): qty 30

## 2020-04-07 MED ORDER — LEVETIRACETAM 100 MG/ML PO SOLN
500.0000 mg | Freq: Two times a day (BID) | ORAL | Status: DC
  Administered 2020-04-07 – 2020-05-03 (×53): 500 mg
  Filled 2020-04-07 (×53): qty 5

## 2020-04-07 MED ORDER — MAGNESIUM SULFATE 50 % IJ SOLN
3.0000 g | Freq: Once | INTRAVENOUS | Status: AC
  Administered 2020-04-07: 3 g via INTRAVENOUS
  Filled 2020-04-07 (×2): qty 6

## 2020-04-07 NOTE — Progress Notes (Signed)
Two trach sutures removed.

## 2020-04-07 NOTE — Progress Notes (Signed)
During RRT morning trach assessment, pt found to have minimal breath sounds in LL with SATs maintaining 90%. Saline levaged and deeply suctioned, multiple mucous plugs removed. SATs increased to 97-99%. Ventilation increased in LL.

## 2020-04-07 NOTE — Progress Notes (Signed)
PROGRESS NOTE    Vickie Alvarado  PYK:[id number] DOB: Oct 03, 1965 DOA: 03/20/2020 PCP: [facility]    No chief complaint on file.   Brief Narrative:   54 year old lady with prior history of hypertension, hyperlipidemia, COVID-19 infection diagnosed on 03/01/2020, dental infection and sinus infections presented with right-sided weakness, dysarthria and right facial droop was found to have abscess in the left basal ganglion.  Patient underwent left frontal stereotactic biopsy and aspiration of the abscess by neurosurgery on 03/22/2020 and transferred to ICU.  Patient was initially consulted by PCCM.  Hospital course was complicated by Streptococcus intermedius bacteremia, ID consulted and she is currently on Rocephin to complete the course.  Her course was complicated by acute hypoxic respiratory failure requiring mechanical ventilation, failure to extubate underwent percutaneous tracheostomy and patient currently remains on trach collar and is tolerating well on 5 L of oxygen.  Patient was transferred to [facility] on 04/03/2020.  Neurosurgery on board, recommended to complete the course of rocephin, continue with keppra and wean decadron and repeat MRI in 1 to 2 weeks. She underwent PEG placement on 04/04/2020. Tube feeds were restarted. Patient seen and examined at bedside, she appears comfortable , not in distress. Family member at bedside and updated the lab work.     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  No change  neurologically.  Continue with IV rocephin for strep intermedius brain abscess. Discussed with Dr Linus Salmons with ID, recommend 8 weeks of IV Rocephin. 03/22/20 as Day 1 of antibiotics.  May 17, 2020 will be last day of IV Rocephin.  No fevers or agitation.    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  No change in her neurological exam.  S/p trach on 04/01/20 and she underwent PEG placement on 04/04/2020. Tube feeds were restarted . No new complaints.    Acute hypoxic respiratory failure s/p intubation, failed extubation and tracheostomy done Patient currently is on trach collar on 5 L of oxygen and is tolerating well. Continue with pulm hygiene.  Will need to change to cuffless prior to discharge.  Diminished air entry at bases.     Essential hypertension Well controlled bp parameters.  SBP goal of less than 160 mmHg. Continue with amlodipine.    Diabetes mellitus, poorly controlled with hyperglycemia CBG (last 3)  Recent Labs    04/07/20 0332 04/07/20 0740 04/07/20 1140  GLUCAP 192* 164* 167*   Continue with sliding scale insulin. lantus 20 units daily and 3 units every 4 hours as pt is on tube feeds.  No changes in meds.    Nutrition  Currently has Cortrak and has Jevity running at 65 ml/hr.  Underwent PEG placement.   H/p Bipolar disorder:  No agitation seen.   Leukocytosis.  Resolved.      Mild anemia of chronic disease:  Hemoglobin of 11.   Hypokalemia and hypomagnesemia; Replaced.    DVT prophylaxis: Heparin Code Status: Full  code Family Communication: None at bedside, discussed with daughter over the phone.  Disposition:   Status is: Inpatient  Remains inpatient appropriate because:Altered mental status, Unsafe d/c plan, IV treatments appropriate due to intensity of  illness or inability to take PO and Inpatient level of care appropriate due to severity of illness   Dispo: The patient is from: Home              Anticipated d/c is to: Pending SNF? LTAC?               Anticipated d/c date is: > 3 days              Patient currently is not medically stable to d/c.       Consultants:   Neurosurgery  PCCM  Procedures:  8/27> L frontal stereotactic biopsy/aspiration of L basal ganglia mass   DIAGNOSTIC BRONCHOSCOPY BY DR Tamala Julian on 04/01/20 Percutaneous Trach placement on 04/01/2020   Micro Data:  8/26 BC x2> NGTD 8/27 Gram Stain> sent 8/27 Aerobic/Anaerobe> strep intermedius, pan sensitive  8/27 Fungus Culture with stain> Sent    Antimicrobials:  8/27 Cefepime>> 8/27 8/27 Vanc>> 8/29 8/27 Metro>> 8/29 8/27 Ceftriaxone>>   Subjective: Overnight she had multple mucous plugs which were suctioned.  Appreciate RT recommendations.   Objective: Vitals:   04/07/20 0738 04/07/20 0741 04/07/20 1140 04/07/20 1444  BP: 118/77  105/71   Pulse: 100 (!) 104 88 88  Resp: (!) 24 16 19    Temp: (!) 97.1 F (36.2 C)  98.3 F (36.8 C)   TempSrc: Oral  Axillary   SpO2: 93% 97% 100%   Weight:      Height:        Intake/Output Summary (Last 24 hours) at 04/07/2020 1531 Last data filed at 04/07/2020 1435 Gross per 24 hour  Intake --  Output 1351 ml  Net -1351 ml   Filed Weights   04/05/20 0355 04/06/20 0401 04/07/20 0334  Weight: 71.2 kg 73.2 kg 70.6 kg    Examination:  General exam: Ill looking lady , not in distress today.  Respiratory system: diminished air entry at bases, no wheezing or rhonchi.  Cardiovascular system: S1S2 heard RRR , no JVD, no pedal edema.  Gastrointestinal system: Abdomen is soft, non distended bowel sounds wnl. PEG in place.  Central nervous system: not responding to commands, does not track ,  Extremities: on contracture ankle boots, no pedal edema.  Skin: No rashes seen.  Psychiatry: Cannot be  assessed.    Data Reviewed: I have personally reviewed following labs and imaging studies  CBC: Recent Labs  Lab 04/01/20 0620 04/04/20 1446 04/07/20 0435  WBC 17.8* 10.7* 5.9  NEUTROABS 14.7* 8.9*  --   HGB 11.2* 11.6* 10.0*  HCT 35.1* 35.8* 30.4*  MCV 92.1 91.3 92.1  PLT 232 199 929    Basic Metabolic Panel: Recent Labs  Lab 04/01/20 0620 04/04/20 1530 04/07/20 0435 04/07/20 1444  NA 139 134* 135  --   K 3.8 3.8 3.3*  --   CL 103 103 104  --   CO2 27 22 23   --   GLUCOSE 144* 208* 168*  --   BUN 16 15 8   --   CREATININE 0.35* 0.34* 0.31*  --   CALCIUM 8.4* 8.2* 7.0*  --   MG  --   --   --  1.5*    GFR: Estimated Creatinine Clearance: 77.3 mL/min (A) (by C-G formula based on SCr of 0.31 mg/dL (  L)).  Liver Function Tests: Recent Labs  Lab 04/04/20 1530  AST 16  ALT 48*  ALKPHOS 67  BILITOT 0.3  PROT 5.3*  ALBUMIN 2.6*    CBG: Recent Labs  Lab 04/06/20 1937 04/06/20 2331 04/07/20 0332 04/07/20 0740 04/07/20 1140  GLUCAP 144* 144* 192* 164* 167*     No results found for this or any previous visit (from the past 240 hour(s)).       Radiology Studies: No results found.      Scheduled Meds: . amLODipine  5 mg Per Tube Daily  . chlorhexidine gluconate (MEDLINE KIT)  15 mL Mouth Rinse BID  . Chlorhexidine Gluconate Cloth  6 each Topical Daily  . docusate  100 mg Per Tube BID  . feeding supplement (PROSource TF)  45 mL Per Tube BID  . heparin injection (subcutaneous)  5,000 Units Subcutaneous Q8H  . insulin aspart  0-20 Units Subcutaneous Q4H  . insulin aspart  3 Units Subcutaneous Q4H  . insulin glargine  20 Units Subcutaneous Daily  . mouth rinse  15 mL Mouth Rinse 10 times per day  . pantoprazole sodium  40 mg Per Tube QHS  . polyethylene glycol  17 g Per Tube Daily  . potassium chloride  40 mEq Per Tube BID  . sodium chloride flush  10-40 mL Intracatheter Q12H   Continuous Infusions: . cefTRIAXone (ROCEPHIN)  IV Stopped  (04/07/20 1435)  . dextrose 5% lactated ringers 30 mL/hr at 04/07/20 0840  . feeding supplement (JEVITY 1.2 CAL) 1,000 mL (04/06/20 0827)  . levETIRAcetam 500 mg (04/07/20 0420)     LOS: 18 days       Hosie Poisson, MD [facility]   To contact the attending provider between 7A-7P or the covering provider during after hours 7P-7A, please log into the web site [email or web address] and access using universal [place] password for that web site. If you do not have the password, please call the hospital operator.  04/07/2020, 3:31 PM

## 2020-04-08 ENCOUNTER — Inpatient Hospital Stay (HOSPITAL_COMMUNITY): Payer: Medicare HMO

## 2020-04-08 LAB — IRON AND TIBC
Iron: 36 ug/dL (ref 28–170)
Saturation Ratios: 16 % (ref 10.4–31.8)
TIBC: 225 ug/dL — ABNORMAL LOW (ref 250–450)
UIBC: 189 ug/dL

## 2020-04-08 LAB — GLUCOSE, CAPILLARY
Glucose-Capillary: 131 mg/dL — ABNORMAL HIGH (ref 70–99)
Glucose-Capillary: 135 mg/dL — ABNORMAL HIGH (ref 70–99)
Glucose-Capillary: 144 mg/dL — ABNORMAL HIGH (ref 70–99)
Glucose-Capillary: 151 mg/dL — ABNORMAL HIGH (ref 70–99)
Glucose-Capillary: 151 mg/dL — ABNORMAL HIGH (ref 70–99)
Glucose-Capillary: 96 mg/dL (ref 70–99)

## 2020-04-08 LAB — RETICULOCYTES
Immature Retic Fract: 7 % (ref 2.3–15.9)
RBC.: 3.64 MIL/uL — ABNORMAL LOW (ref 3.87–5.11)
Retic Count, Absolute: 109.2 10*3/uL (ref 19.0–186.0)
Retic Ct Pct: 3 % (ref 0.4–3.1)

## 2020-04-08 LAB — MAGNESIUM: Magnesium: 1.8 mg/dL (ref 1.7–2.4)

## 2020-04-08 LAB — FERRITIN: Ferritin: 760 ng/mL — ABNORMAL HIGH (ref 11–307)

## 2020-04-08 LAB — POTASSIUM: Potassium: 4.2 mmol/L (ref 3.5–5.1)

## 2020-04-08 LAB — FOLATE: Folate: 5.9 ng/mL — ABNORMAL LOW (ref >5.9)

## 2020-04-08 LAB — VITAMIN B12: Vitamin B-12: 1168 pg/mL — ABNORMAL HIGH (ref 180–914)

## 2020-04-08 IMAGING — CT CT HEAD W/O CM
3 series · 14 of 47 positions shown, 16 images · non-contrast
Comparison: MR head without and with contrast [DATE]. CT head
without contrast [DATE].

CLINICAL DATA: Stroke. Cerebritis. Cerebral abscess. Left-sided
ptosis.

EXAM:
CT HEAD WITHOUT CONTRAST
TECHNIQUE: Contiguous axial images were obtained from the base of the skull
through the vertex without intravenous contrast.

[Series 3: head 5.0 h30s · axial · 0.42mm/px · z∈[-80,+50]mm · 8 of 32 slices shown, 10 images]
[im 3/32  brain]
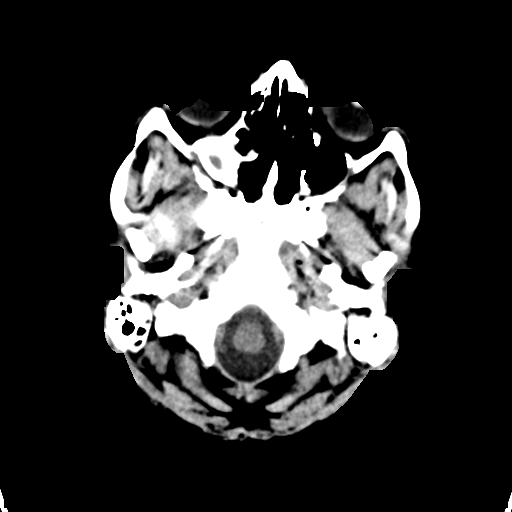
[im 3/32  bone]
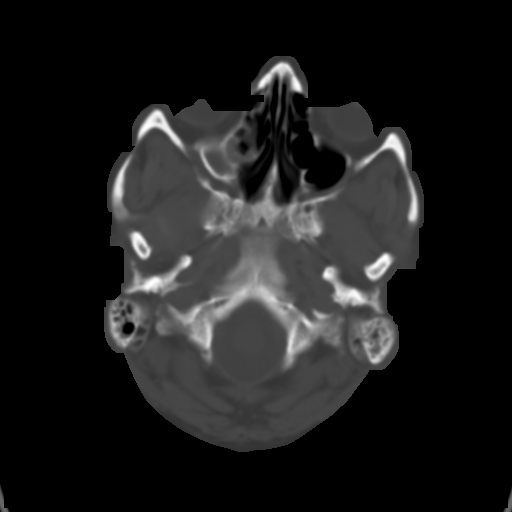
[im 7/32  brain]
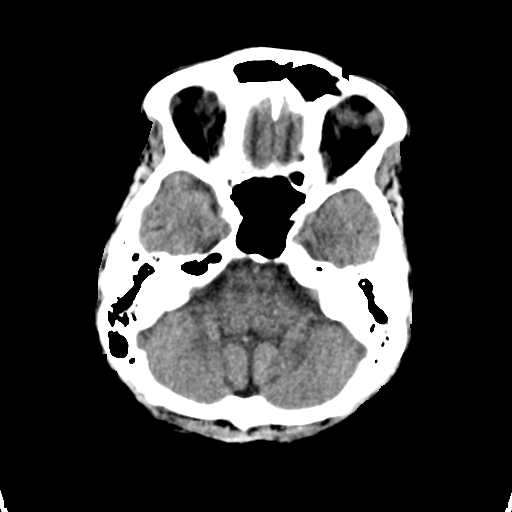
[im 10/32  brain]
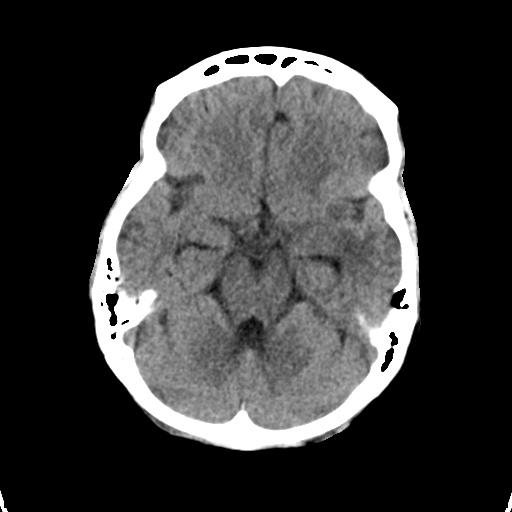
[im 14/32  brain]
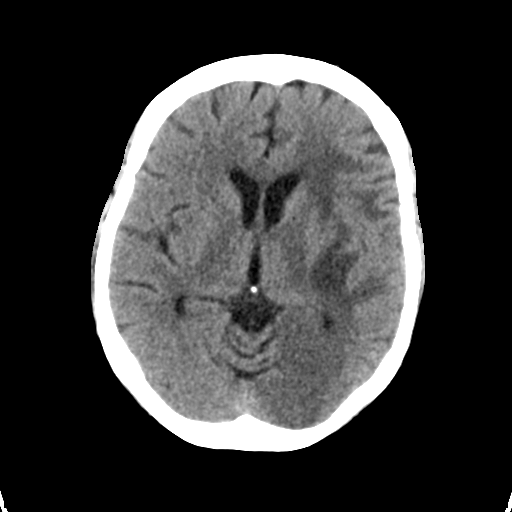
[im 18/32  brain]
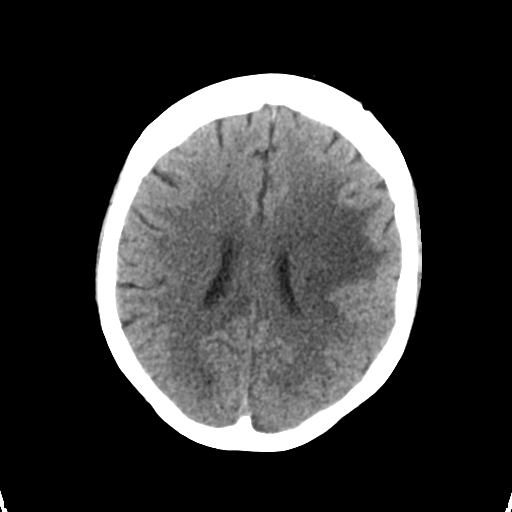
[im 18/32  bone]
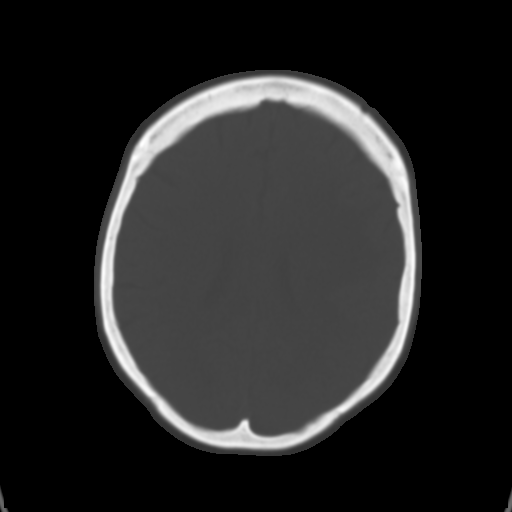
[im 22/32  brain]
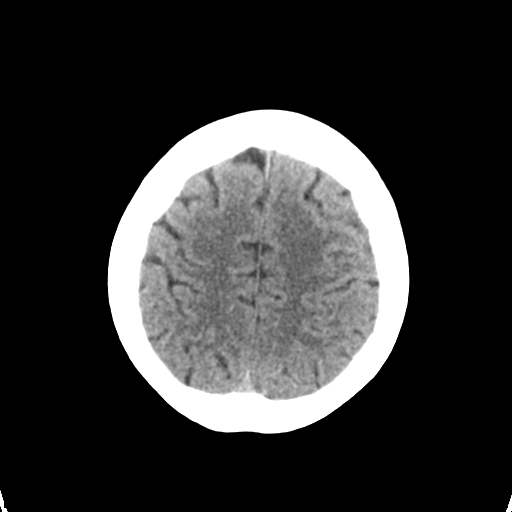
[im 25/32  brain]
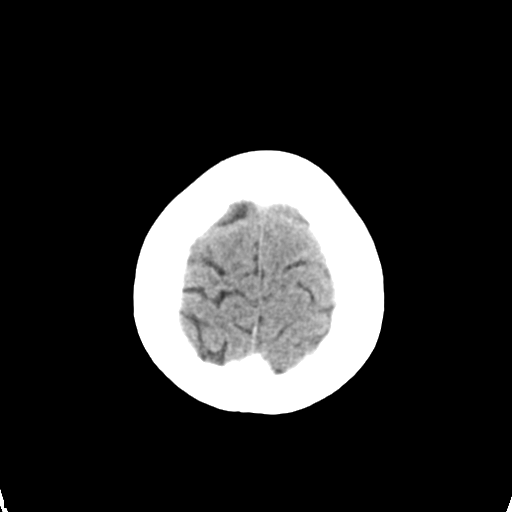
[im 29/32  brain]
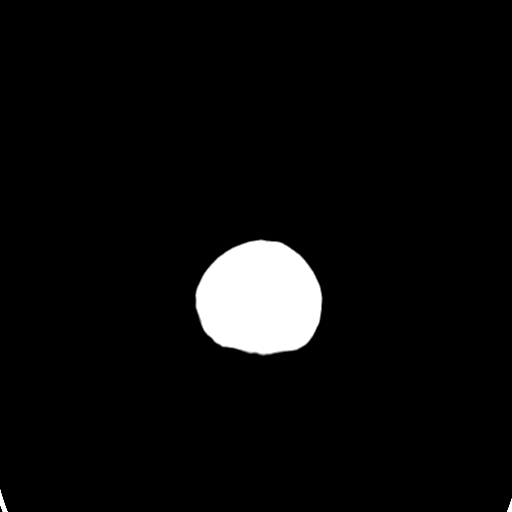

[Series 5: head 3.0 mpr cor · coronal · 0.32mm/px · 3 of 67 slices shown]
[im 23/67  brain]
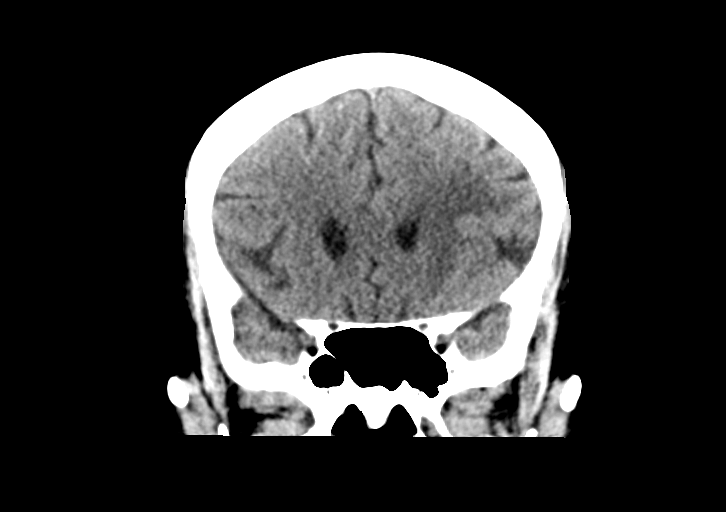
[im 30/67  brain]
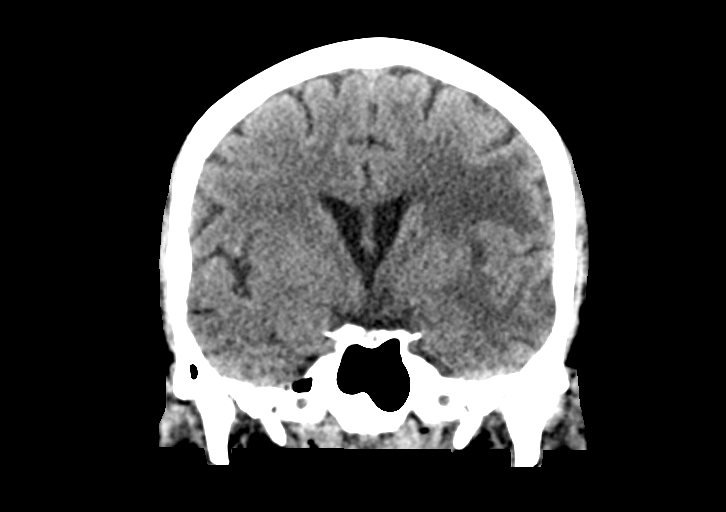
[im 37/67  brain]
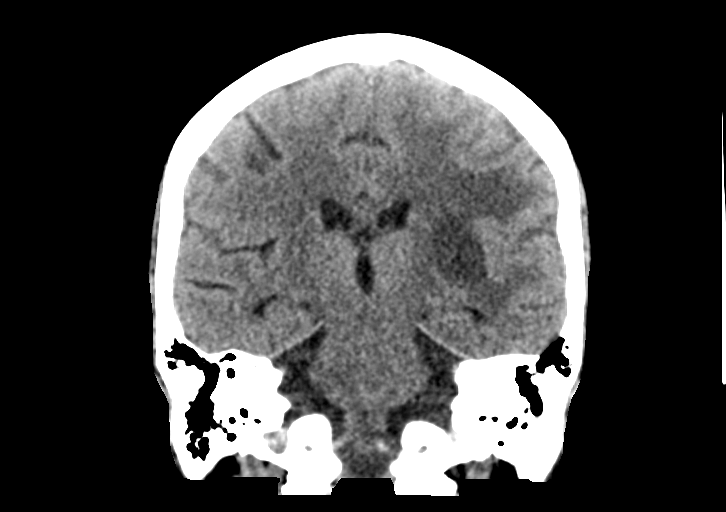

[Series 6: head 3.0 mpr sag · sagittal · 0.34mm/px · 3 of 62 slices shown]
[im 21/62  brain]
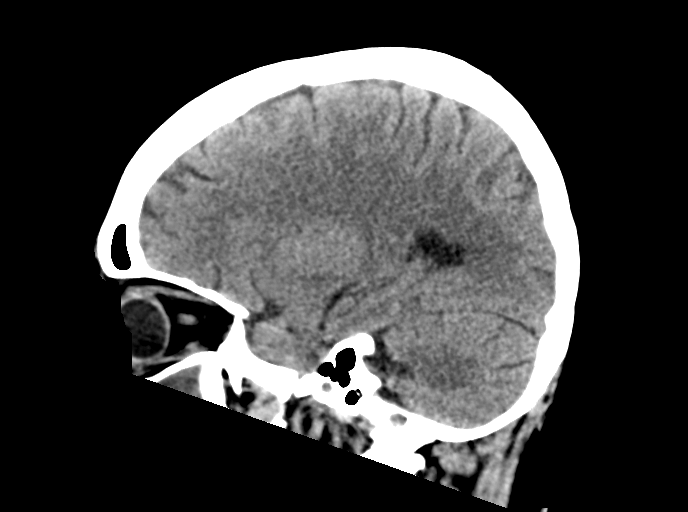
[im 31/62  brain]
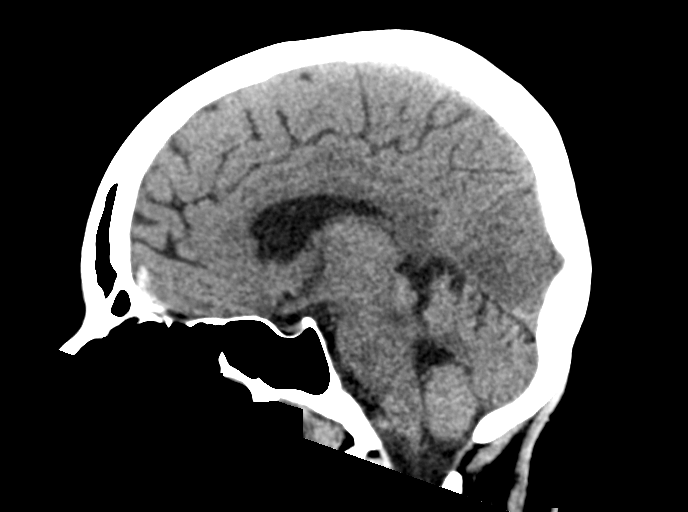
[im 41/62  brain]
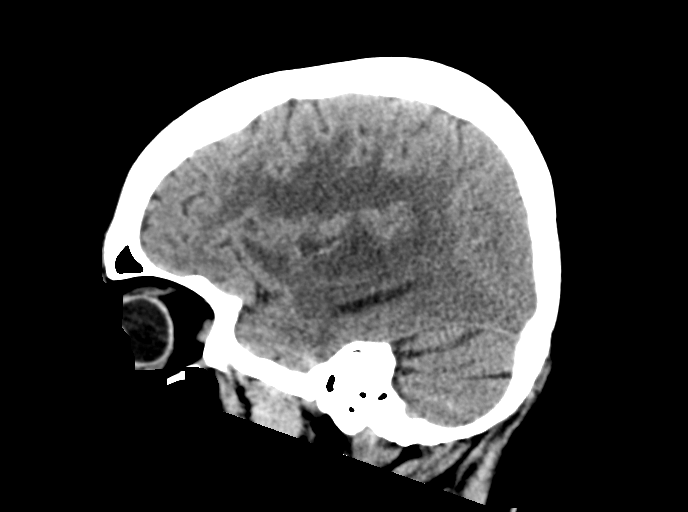

[14 of 47 positions shown; findings below may reference images not displayed]

FINDINGS: Brain: Previously noted fluid collections in the left operculum have
significantly improved. No fluid levels or gas collections are
present. Attenuation of the left lentiform nucleus is restored.
Persistent hypoattenuation is present throughout the left parietal
and occipital white matter. Left occipital cortical infarcts are
noted. Midline shift of 2-3 mm is improved.

Vascular: Atherosclerotic calcifications are present within the
cavernous internal carotid arteries bilaterally. No hyperdense
vessel is present.

Skull: Left frontal burr hole is noted. Calvarium is otherwise
intact. No significant extracranial soft tissue lesion is present.

Sinuses/Orbits: Chronic right maxillary sinus specification is
present. Bilateral mastoid effusions are present, left greater than
right. Middle ear cavity is clear. No obstruction is present.
Sinuses are otherwise clear. The globes and orbits are within normal
limits.
IMPRESSION: 1. Significantly improved fluid collections in the left operculum.
No fluid levels or gas collections are present.
2. Persistent hypoattenuation throughout the left parietal and
occipital white matter compatible with acute/subacute infarcts.
3. Left occipital cortical infarcts.
4. Improved midline shift of 2-3 mm.
5. Bilateral mastoid effusions, left greater than right. No
obstruction is present.
6. Chronic right maxillary sinus disease.

## 2020-04-08 NOTE — Progress Notes (Signed)
PT Cancellation Note  Patient Details Name: Kizzi Overbey MRN: [id number] DOB: 1966/07/11   Cancelled Treatment:    Reason Eval/Treat Not Completed: Patient at procedure or test/unavailable - off the floor at CT, will check back as schedule allows.  Richrd Sox, PT Acute Rehabilitation Services Pager [phone]  Office [phone]     Nicola Police 04/08/2020, 2:12 PM

## 2020-04-08 NOTE — Progress Notes (Signed)
PROGRESS NOTE    Vickie Alvarado  ZHY:[id number] DOB: Dec 04, 1965 DOA: 03/20/2020 PCP: [facility]    No chief complaint on file.   Brief Narrative:   54 year old lady with prior history of hypertension, hyperlipidemia, COVID-19 infection diagnosed on 03/01/2020, dental infection and sinus infections presented with right-sided weakness, dysarthria and right facial droop was found to have abscess in the left basal ganglion.  Patient underwent left frontal stereotactic biopsy and aspiration of the abscess by neurosurgery on 03/22/2020 and transferred to ICU.  Patient was initially consulted by PCCM.  Hospital course was complicated by Streptococcus intermedius bacteremia, ID consulted and she is currently on Rocephin to complete the course.  Her course was complicated by acute hypoxic respiratory failure requiring mechanical ventilation, failure to extubate underwent percutaneous tracheostomy and patient currently remains on trach collar and is tolerating well on 5 L of oxygen.  Patient was transferred to [facility] on 04/03/2020.  Neurosurgery on board, recommended to complete the course of rocephin, continue with keppra and wean decadron and repeat MRI in 1 to 2 weeks. She underwent PEG placement on 04/04/2020. Tube feeds were restarted. Patient seen and examined at bedside, she has worsening left eyelid dropping this morning.  Will order CT head without contrast for further evaluation.  She does not appear to be in distress at this time.    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  Worsening of the left eyelid drooping. Will order CT head without contrast for further evaluation.  Continue with IV rocephin for strep intermedius brain abscess. Discussed with Dr Linus Salmons with ID, recommend 8 weeks of IV Rocephin. 03/22/20 as Day 1 of antibiotics.  May 17, 2020 will be last day of IV Rocephin.  No fevers or agitation.    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  No change in her neurological exam.  S/p trach on 04/01/20 and she underwent PEG placement on 04/04/2020. Tube feeds were restarted . No new complaints.    Acute hypoxic respiratory failure s/p intubation, failed extubation and tracheostomy done Patient currently is on trach collar on 5 L of oxygen and is tolerating well. Continue with pulm hygiene. Slight tachypnea on exam today.  Will need to change to cuffless prior to discharge.     Essential hypertension Well controlled BP parameters.  SBP goal of less than 160 mmHg. Continue with amlodipine.    Diabetes mellitus, poorly controlled with hyperglycemia CBG (last 3)  Recent Labs    04/08/20 0021 04/08/20 0450 04/08/20 0711  GLUCAP 151* 151* 135*   Continue with sliding scale insulin. lantus 20 units daily and 3 units every 4 hours as pt is on tube feeds.  No changes in meds.    Nutrition  Currently has Cortrak and has Jevity running at 65 ml/hr.  Underwent PEG placement.   H/p Bipolar disorder:  No agitation seen.   Leukocytosis.  Resolved.  Mild anemia of chronic disease:  Get anemia panel,  hemoglobin of 10.   Hypokalemia and hypomagnesemia; Replaced.    DVT prophylaxis: Heparin Code Status: Full code Family Communication: None at bedside, discussed with daughter over the phone on  04/07/20 Disposition:   Status is: Inpatient  Remains inpatient appropriate because:Altered mental status, Unsafe d/c plan, IV treatments appropriate due to intensity of illness or inability to take PO and Inpatient level of care appropriate due to severity of illness   Dispo: The patient is from: Home              Anticipated d/c is to: Pending SNF               Anticipated d/c date is: > 3 days              Patient currently is not medically stable to d/c.       Consultants:   Neurosurgery  PCCM  Procedures:  8/27> L frontal stereotactic biopsy/aspiration of L basal ganglia mass   DIAGNOSTIC BRONCHOSCOPY BY DR Tamala Julian on 04/01/20 Percutaneous Trach placement on 04/01/2020   Micro Data:  8/26 BC x2> NGTD 8/27 Gram Stain> sent 8/27 Aerobic/Anaerobe> strep intermedius, pan sensitive  8/27 Fungus Culture with stain> Sent    Antimicrobials:  8/27 Cefepime>> 8/27 8/27 Vanc>> 8/29 8/27 Metro>> 8/29 8/27 Ceftriaxone>>   Subjective: Worsening left eye lid droop. Pt not following commands.opens eyes to sternal rub.  Objective: Vitals:   04/07/20 1542 04/07/20 2335 04/08/20 0717 04/08/20 0829  BP: 108/73 113/73 109/70   Pulse: 84   85  Resp: _0 Temp: 98.1 F (36.7 C) 98.2 F (36.8 C) 98.8 F (37.1 C)   TempSrc: Axillary Axillary Oral   SpO2: 100% 99% 100%   Weight:      Height:        Intake/Output Summary (Last 24 hours) at 04/08/2020 0908 Last data filed at 04/08/2020 0300 Gross per 24 hour  Intake 4365.7 ml  Output 1500 ml  Net 2865.7 ml   Filed Weights   04/05/20 0355 04/06/20 0401 04/07/20 0334  Weight: 71.2 kg 73.2 kg 70.6 kg    Examination:  General exam: Ill looking lady , not in distress. S/p trach on 5 lit of oxygen.  Respiratory system: Diminished air entry at bases, no wheezing or rhonchi.  Cardiovascular system: S1S2 heard, RRR, no JVD, no pedal edema  Gastrointestinal system: Abdomen is soft, non tender non distended, bowel  sounds wnl. PEG in place.  Central nervous system: not following commands. Does not track.  Extremities: on contracture ankle boots, No pedal edema.  Skin: No rashes seen.  Psychiatry: Cannot be assessed.    Data Reviewed: I have personally reviewed following labs and imaging studies  CBC: Recent Labs  Lab 04/04/20 1446 04/07/20 0435  WBC 10.7* 5.9  NEUTROABS 8.9*  --   HGB 11.6* 10.0*  HCT 35.8* 30.4*  MCV 91.3 92.1  PLT 199 286    Basic Metabolic Panel: Recent Labs  Lab 04/04/20 1530 04/07/20 0435 04/07/20 1444 04/08/20 0500  NA 134* 135  --   --   K 3.8 3.3*  --  4.2  CL 103 104  --   --   CO2 22 23  --   --   GLUCOSE 208* 168*  --   --   BUN 15 8  --   --   CREATININE 0.34* 0.31*  --   --   CALCIUM  8.2* 7.0*  --   --   MG  --   --  1.5*  --     GFR: Estimated Creatinine Clearance: 77.3 mL/min (A) (by C-G formula based on SCr of 0.31 mg/dL (L)).  Liver Function Tests: Recent Labs  Lab 04/04/20 1530  AST 16  ALT 48*  ALKPHOS 67  BILITOT 0.3  PROT 5.3*  ALBUMIN 2.6*    CBG: Recent Labs  Lab 04/07/20 1540 04/07/20 2044 04/08/20 0021 04/08/20 0450 04/08/20 0711  GLUCAP 142* 166* 151* 151* 135*     No results found for this or any previous visit (from the past 240 hour(s)).       Radiology Studies: No results found.      Scheduled Meds: . amLODipine  5 mg Per Tube Daily  . chlorhexidine gluconate (MEDLINE KIT)  15 mL Mouth Rinse BID  . Chlorhexidine Gluconate Cloth  6 each Topical Daily  . docusate  100 mg Per Tube BID  . feeding supplement (PROSource TF)  45 mL Per Tube BID  . heparin injection (subcutaneous)  5,000 Units Subcutaneous Q8H  . insulin aspart  0-20 Units Subcutaneous Q4H  . insulin aspart  3 Units Subcutaneous Q4H  . insulin glargine  20 Units Subcutaneous Daily  . levETIRAcetam  500 mg Per Tube BID  . mouth rinse  15 mL Mouth Rinse 10 times per day  . pantoprazole sodium  40 mg Per Tube QHS  . polyethylene  glycol  17 g Per Tube Daily  . potassium chloride  40 mEq Per Tube BID  . sodium chloride flush  10-40 mL Intracatheter Q12H   Continuous Infusions: . cefTRIAXone (ROCEPHIN)  IV 2 g (04/07/20 2202)  . dextrose 5% lactated ringers 30 mL/hr at 04/07/20 0840  . feeding supplement (JEVITY 1.2 CAL) 1,000 mL (04/07/20 1752)     LOS: 19 days       Hosie Poisson, MD [facility]   To contact the attending provider between 7A-7P or the covering provider during after hours 7P-7A, please log into the web site [email or web address] and access using universal [place] password for that web site. If you do not have the password, please call the hospital operator.  04/08/2020, 9:08 AM

## 2020-04-08 NOTE — Progress Notes (Signed)
PHARMACY CONSULT NOTE FOR:  OUTPATIENT  PARENTERAL ANTIBIOTIC THERAPY (OPAT)  Indication: brain abscess Regimen: ceftriaxone 2g IV every 12 hours End date: 05/17/2020  IV antibiotic discharge orders are pended. To discharging provider:  please sign these orders via discharge navigator,  Select New Orders & click on the button choice - Manage This Unsigned Work.     Thank you for allowing pharmacy to be a part of this patient's care.  Kinnie Feil, PharmD PGY1 Acute Care Pharmacy Resident Phone: [phone] 04/08/2020 3:55 PM  Please check [email or web address] for unit specific pharmacy phone numbers.

## 2020-04-09 LAB — GLUCOSE, CAPILLARY
Glucose-Capillary: 136 mg/dL — ABNORMAL HIGH (ref 70–99)
Glucose-Capillary: 148 mg/dL — ABNORMAL HIGH (ref 70–99)
Glucose-Capillary: 153 mg/dL — ABNORMAL HIGH (ref 70–99)
Glucose-Capillary: 156 mg/dL — ABNORMAL HIGH (ref 70–99)
Glucose-Capillary: 165 mg/dL — ABNORMAL HIGH (ref 70–99)
Glucose-Capillary: 167 mg/dL — ABNORMAL HIGH (ref 70–99)

## 2020-04-09 MED ORDER — FOLIC ACID 1 MG PO TABS
1.0000 mg | ORAL_TABLET | Freq: Every day | ORAL | Status: DC
  Administered 2020-04-09 – 2020-05-03 (×25): 1 mg
  Filled 2020-04-09 (×25): qty 1

## 2020-04-09 NOTE — Progress Notes (Signed)
Occupational Therapy Treatment Patient Details Name: Vickie Alvarado MRN: [id number] DOB: 1965-08-07 Today's Date: 04/09/2020    History of present illness 54 y/o F with a history of HTN, HLD, COVID-19 infection (03/01/20), dental infections, and sinus infections presented to [facility] after being found altered with R sided weakness, dysarthria, and R facial droop. Patient had decreased  P.O. intake, decreased appetite, and projectile vomiting in addition to her other symptoms. 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 Neurosurgery performed a L frontal stereotactic biopsy/aspiration of the abscess. 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 ETT 8/28, trach 9/6.   OT comments  Patient continues to make minimal progress towards goals in skilled OT session. Patient's session encompassed co-treat with PT in order to address functional limitations safely in session. Pt remains a total A of 2 for all bed mobility and ADLs, and unable to squeeze hands to provide a thumbs up. Visual tracking is also inconsistent, as pt will respond to name 50% of the time, however when head is positioned in midline, there is little to no visual tracking to find daughter or therapist on L or R side. When pt's head is positioned to the L pt will follow therapist is daughters voice to R, but nystagmus is noted with movement. Of note, pt now has no blink to threat reflex for either eye, despite attempting multiple times throughout session. When engaged in pull to sit x2 in bed, pt a total A with no head control. Discharge remains appropriate, will continue to follow acutely.    Follow Up Recommendations  SNF;LTACH;Supervision/Assistance - 24 hour    Equipment Recommendations  Hospital bed    Recommendations for Other Services      Precautions / Restrictions  Precautions Precautions: Fall Precaution Comments: trach 5L/28%, hypotonicity Restrictions Weight Bearing Restrictions: No       Mobility Bed Mobility Overal bed mobility: Needs Assistance Bed Mobility: Supine to Sit;Sit to Supine;Rolling Rolling: Total assist;+2 for physical assistance   Supine to sit: Total assist;+2 for physical assistance Sit to supine: Total assist;+2 for physical assistance   General bed mobility comments: total assist +2 for pull to long sit for trunk elevation/lowering, maintaining upright head position as pt unable to hold head up anti-gravity. Pull to sit attempted x2, able to maintain upright x10 seconds with max assist before heavy posterior leaning. total +2 for rolling bilaterally for pericare, pt soiled in urine and feces.  Transfers                 General transfer comment: deferred    Balance Overall balance assessment: Needs assistance   Sitting balance-Leahy Scale: Zero Sitting balance - Comments: Zero balance long sitting                                    ADL either performed or assessed with clinical judgement   ADL Overall ADL's : Needs assistance/impaired     Grooming: Total assistance;Wash/dry face Grooming Details (indicate cue type and reason): Total A to wash face at bed level, no active movement                     Toileting- Clothing Manipulation and Hygiene: Total assistance;Bed level  Functional mobility during ADLs: Total assistance;+2 for physical assistance;+2 for safety/equipment General ADL Comments: Pt Total A for all ADLs and mobility. Unable to follow commands, flaccid UE/LE     Vision       Perception     Praxis      Cognition Arousal/Alertness: Awake/alert Behavior During Therapy: Flat affect Overall Cognitive Status: Difficult to assess Area of Impairment: Attention;Following commands;Awareness;Problem solving                   Current Attention Level:  Focused   Following Commands: Follows one step commands inconsistently   Awareness: Intellectual Problem Solving: Slow processing;Decreased initiation;Difficulty sequencing;Requires verbal cues;Requires tactile cues General Comments: opens eyes to name, visually tracks faces. 0% commands followed (thumbs up, squeeze my hand).        Exercises Other Exercises Other Exercises: family education: Stand towards pt's R and engage pt to improve attention to R side, play familiar and liked music   Shoulder Instructions       General Comments no response to painful stimuli, (-) blink to threat both eyes today, dysconjugate gaze    Pertinent Vitals/ Pain       Pain Assessment: Faces Faces Pain Scale: Hurts a little bit Pain Location: generalized Pain Descriptors / Indicators: Grimacing Pain Intervention(s): Limited activity within patient's tolerance;Monitored during session  Home Living                                          Prior Functioning/Environment              Frequency  Min 2X/week        Progress Toward Goals  OT Goals(current goals can now be found in the care plan section)  Progress towards OT goals: Progressing toward goals  Acute Rehab OT Goals Patient Stated Goal: unable OT Goal Formulation: Patient unable to participate in goal setting Time For Goal Achievement: 04/17/20 Potential to Achieve Goals: Fair  Plan Discharge plan remains appropriate    Co-evaluation      Reason for Co-Treatment: For patient/therapist safety;Complexity of the patient's impairments (multi-system involvement);Necessary to address cognition/behavior during functional activity PT goals addressed during session: Mobility/safety with mobility;Balance OT goals addressed during session: ADL's and self-care      AM-PAC OT "6 Clicks" Daily Activity     Outcome Measure   Help from another person eating meals?: Total Help from another person taking care of  personal grooming?: Total Help from another person toileting, which includes using toliet, bedpan, or urinal?: Total Help from another person bathing (including washing, rinsing, drying)?: Total Help from another person to put on and taking off regular upper body clothing?: Total Help from another person to put on and taking off regular lower body clothing?: Total 6 Click Score: 6    End of Session Equipment Utilized During Treatment: Oxygen  OT Visit Diagnosis: Muscle weakness (generalized) (M62.81);Other abnormalities of gait and mobility (R26.89);Apraxia (R48.2);Ataxia, unspecified (R27.0);Feeding difficulties (R63.3);Other symptoms and signs involving cognitive function;Other symptoms and signs involving the nervous system (R29.898)   Activity Tolerance Patient limited by fatigue;Patient limited by lethargy   Patient Left in bed;with call bell/phone within reach   Nurse Communication Mobility status        Time: 7672-0947 OT Time Calculation (min): 30 min  Charges: OT General Charges $OT Visit: 1 Visit OT Treatments $Self Care/Home Management : 8-22 mins  Pollyann Glen E. Makaria Poarch, COTA/L Acute Rehabilitation Services [phone]   Cherlyn Cushing 04/09/2020, 1:49 PM

## 2020-04-09 NOTE — Progress Notes (Signed)
Physical Therapy Treatment Patient Details Name: Vickie Alvarado MRN: [id number] DOB: 17-Feb-1966 Today's Date: 04/09/2020    History of Present Illness 54 y/o F with a history of HTN, HLD, COVID-19 infection (03/01/20), dental infections, and sinus infections presented to [facility] after being found altered with R sided weakness, dysarthria, and R facial droop. Patient had decreased  P.O. intake, decreased appetite, and projectile vomiting in addition to her other symptoms. 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 Neurosurgery performed a L frontal stereotactic biopsy/aspiration of the abscess. 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 ETT 8/28, trach 9/6.    PT Comments    Pt opens eyes to name, follows no commands at this point during PT/OT session. Pt continues to require total assist +2 for all bed mobility tasks. VSS during session, pt remains clinically very similar to PT evaluation last week. Will continue to assess pt while acute.     Follow Up Recommendations  SNF;LTACH     Equipment Recommendations  None recommended by PT    Recommendations for Other Services       Precautions / Restrictions Precautions Precautions: Fall Precaution Comments: trach 5L/28%, hypotonicity Restrictions Weight Bearing Restrictions: No    Mobility  Bed Mobility Overal bed mobility: Needs Assistance Bed Mobility: Supine to Sit;Sit to Supine;Rolling Rolling: Total assist;+2 for physical assistance   Supine to sit: Total assist;+2 for physical assistance Sit to supine: Total assist;+2 for physical assistance   General bed mobility comments: total assist +2 for pull to long sit for trunk elevation/lowering, maintaining upright head position as pt unable to hold head up anti-gravity. Pull to sit attempted x2, able to maintain upright x10  seconds with max assist before heavy posterior leaning. total +2 for rolling bilaterally for pericare, pt soiled in urine and feces.  Transfers                 General transfer comment: deferred  Ambulation/Gait                 Stairs             Wheelchair Mobility    Modified Rankin (Stroke Patients Only) Modified Rankin (Stroke Patients Only) Pre-Morbid Rankin Score: No symptoms Modified Rankin: Severe disability     Balance Overall balance assessment: Needs assistance   Sitting balance-Leahy Scale: Zero Sitting balance - Comments: Zero balance long sitting                                     Cognition Arousal/Alertness: Awake/alert Behavior During Therapy: Flat affect Overall Cognitive Status: Difficult to assess Area of Impairment: Attention;Following commands;Awareness;Problem solving                   Current Attention Level: Focused   Following Commands: Follows one step commands inconsistently   Awareness: Intellectual Problem Solving: Slow processing;Decreased initiation;Difficulty sequencing;Requires verbal cues;Requires tactile cues General Comments: opens eyes to name, visually tracks faces. 0% commands followed (thumbs up, squeeze my hand).      Exercises Other Exercises Other Exercises: family education: Stand towards pt's R and engage pt to improve attention to R side, play familiar and liked music    General Comments General comments (skin integrity, edema, etc.): no response  to painful stimuli, (-) blink to threat both eyes today, dysconjugate gaze      Pertinent Vitals/Pain Pain Assessment: Faces Faces Pain Scale: Hurts a little bit Pain Location: generalized Pain Descriptors / Indicators: Grimacing Pain Intervention(s): Limited activity within patient's tolerance;Monitored during session    Home Living                      Prior Function            PT Goals (current goals can now be  found in the care plan section) Acute Rehab PT Goals Patient Stated Goal: unable PT Goal Formulation: Patient unable to participate in goal setting Time For Goal Achievement: 04/17/20 Potential to Achieve Goals: Poor Progress towards PT goals: Not progressing toward goals - comment (remains same as eval)    Frequency    Min 2X/week      PT Plan Current plan remains appropriate    Co-evaluation PT/OT/SLP Co-Evaluation/Treatment: Yes Reason for Co-Treatment: For patient/therapist safety;To address functional/ADL transfers;Complexity of the patient's impairments (multi-system involvement) PT goals addressed during session: Mobility/safety with mobility;Balance        AM-PAC PT "6 Clicks" Mobility   Outcome Measure  Help needed turning from your back to your side while in a flat bed without using bedrails?: Total Help needed moving from lying on your back to sitting on the side of a flat bed without using bedrails?: Total Help needed moving to and from a bed to a chair (including a wheelchair)?: Total Help needed standing up from a chair using your arms (e.g., wheelchair or bedside chair)?: Total Help needed to walk in hospital room?: Total Help needed climbing 3-5 steps with a railing? : Total 6 Click Score: 6    End of Session Equipment Utilized During Treatment: Oxygen Activity Tolerance: Other (comment) (low arousal) Patient left: in bed;with bed alarm set;with call bell/phone within reach;with SCD's reapplied;Other (comment) (with PRAFOs donned) Nurse Communication: Mobility status PT Visit Diagnosis: Muscle weakness (generalized) (M62.81);Hemiplegia and hemiparesis Hemiplegia - Right/Left: Right Hemiplegia - dominant/non-dominant: Dominant Hemiplegia - caused by: Other cerebrovascular disease     Time: 6967-8938 PT Time Calculation (min) (ACUTE ONLY): 30 min  Charges:  $Therapeutic Activity: 8-22 mins                     Rawleigh Rode E, PT Acute Rehabilitation  Services Pager [phone]  Office [phone]   Beila Purdie D Despina Hidden 04/09/2020, 1:45 PM

## 2020-04-09 NOTE — Progress Notes (Signed)
PROGRESS NOTE    Vickie Alvarado  SWF:[id number] DOB: Nov 08, 1965 DOA: 03/20/2020 PCP: [facility]    No chief complaint on file.   Brief Narrative:   54 year old lady with prior history of hypertension, hyperlipidemia, COVID-19 infection diagnosed on 03/01/2020, dental infection and sinus infections presented with right-sided weakness, dysarthria and right facial droop was found to have abscess in the left basal ganglion.  Patient underwent left frontal stereotactic biopsy and aspiration of the abscess by neurosurgery on 03/22/2020 and transferred to ICU.  Patient was initially consulted by PCCM.  Hospital course was complicated by Streptococcus intermedius bacteremia, ID consulted and she is currently on Rocephin to complete the course.  Her course was complicated by acute hypoxic respiratory failure requiring mechanical ventilation, failure to extubate underwent percutaneous tracheostomy and patient currently remains on trach collar and is tolerating well on 5 L of oxygen.  Patient was transferred to [facility] on 04/03/2020.  Neurosurgery on board, recommended to complete the course of rocephin, continue with keppra and wean decadron and repeat MRI in 1 to 2 weeks. She underwent PEG placement on 04/04/2020. Tube feeds were restarted. Patient seen and examined at bedside, she has worsening left eyelid dropping this morning.  CT head without contrast shows Significantly improved fluid collections in the left operculum. Persistent hypoattenuation throughout the left parietal and occipital white matter compatible with acute/subacute infarcts. Left occipital cortical infarcts. Improved midline shift of 2-3 mm. Patient seen and examined at bedside.    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  Worsening of the left eyelid drooping. CT head without contrast shows improved fluid collections and improved midline shift.  Continue with IV rocephin for strep intermedius brain abscess. Discussed with Dr Linus Salmons with ID, recommend 8 weeks of IV Rocephin. 03/22/20 as Day 1 of antibiotics.  May 17, 2020 will be last day of IV Rocephin.     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  No change in her neurological exam.  S/p trach on 04/01/20 and she underwent PEG placement on 04/04/2020. Tube feeds were restarted . No new complaints.    Acute hypoxic respiratory failure s/p intubation, failed extubation and tracheostomy done Patient currently is on trach collar on 5 L of oxygen and is tolerating well. Continue with pulm hygiene. Slight tachypnea on exam today.  Will need to change to cuffless prior to discharge.     Essential hypertension Well controlled BP parameters.  SBP goal of less than 160 mmHg. Continue with amlodipine.     Diabetes mellitus, poorly controlled with hyperglycemia CBG (last 3)  Recent Labs    04/09/20 0038 04/09/20 0338 04/09/20 0742  GLUCAP 153* 156* 136*   Continue with sliding scale insulin. lantus 20 units daily and 3 units every 4 hours as pt is on tube feeds.  No changes in meds.    Nutrition  Currently has Cortrak and has  Jevity running at 65 ml/hr.  Underwent PEG placement.   H/p Bipolar disorder:  No agitation seen.   Leukocytosis.  Resolved.      Mild anemia  of chronic disease:  Anemia panel shows low folate levels. Elevated b 12 levels. Adequate iron and ferritin levels.  hemoglobin of 10.  Replaced folate .    Hypokalemia and hypomagnesemia; Replaced.    DVT prophylaxis: Heparin Code Status: Full code Family Communication: None at bedside, discussed with daughter over the phone on 04/07/20 Disposition:   Status is: Inpatient  Remains inpatient appropriate because:Altered mental status, Unsafe d/c plan, IV treatments appropriate due to intensity of illness or inability to take PO and Inpatient level of care appropriate due to severity of illness   Dispo: The patient is from: Home              Anticipated d/c is to: Pending SNF               Anticipated d/c date is: > 3 days              Patient currently is not medically stable to d/c.       Consultants:   Neurosurgery  PCCM  Procedures:  8/27> L frontal stereotactic biopsy/aspiration of L basal ganglia mass   DIAGNOSTIC BRONCHOSCOPY BY DR Tamala Julian on 04/01/20 Percutaneous Trach placement on 04/01/2020   Micro Data:  8/26 BC x2> NGTD 8/27 Gram Stain> sent 8/27 Aerobic/Anaerobe> strep intermedius, pan sensitive  8/27 Fungus Culture with stain> Sent    Antimicrobials:  8/27 Cefepime>> 8/27 8/27 Vanc>> 8/29 8/27 Metro>> 8/29 8/27 Ceftriaxone>>   Subjective:  No new complaints.  Objective: Vitals:   04/09/20 0221 04/09/20 0339 04/09/20 0740 04/09/20 0900  BP:  119/76 115/67   Pulse: 91 92 76 75  Resp: 20 18 17    Temp:  98.6 F (37 C) 98.7 F (37.1 C)   TempSrc:  Axillary Axillary   SpO2: 96% 98% 100% 100%  Weight:      Height:        Intake/Output Summary (Last 24 hours) at 04/09/2020 1041 Last data filed at 04/09/2020 0456 Gross per 24 hour  Intake 2410.84 ml  Output 1700 ml  Net 710.84 ml   Filed Weights   04/07/20 0334 04/08/20 2120 04/09/20 0101  Weight: 70.6 kg 71.9 kg 71.9 kg    Examination:  General exam: Ill looking lady, not in  distress s/p trach on [place] oxygen.  Respiratory system: air entry fair bilateral, no wheezing or rhonchi.  Cardiovascular system: S1S2, RRR, no JVD,  Gastrointestinal system: Abdomen is soft, non distended, bowel sounds wnl, s/p PEG in place.  Central nervous system: Not following commands, pt does not track.  Extremities: on contracture ankle boots, No pedal edema.  Skin: No rashes seen Psychiatry: Cannot be assessed.    Data Reviewed: I have personally reviewed following labs and imaging studies  CBC: Recent Labs  Lab 04/04/20 1446 04/07/20 0435  WBC 10.7* 5.9  NEUTROABS 8.9*  --   HGB 11.6* 10.0*  HCT 35.8* 30.4*  MCV 91.3 92.1  PLT 199 654    Basic Metabolic Panel: Recent Labs  Lab 04/04/20 1530 04/07/20 0435 04/07/20 1444 04/08/20 0500 04/08/20 1235  NA 134* 135  --   --   --   K 3.8 3.3*  --  4.2  --   CL 103 104  --   --   --   CO2 22 23  --   --   --  GLUCOSE 208* 168*  --   --   --   BUN 15 8  --   --   --   CREATININE 0.34* 0.31*  --   --   --   CALCIUM 8.2* 7.0*  --   --   --   MG  --   --  1.5*  --  1.8    GFR: Estimated Creatinine Clearance: 77.3 mL/min (A) (by C-G formula based on SCr of 0.31 mg/dL (L)).  Liver Function Tests: Recent Labs  Lab 04/04/20 1530  AST 16  ALT 48*  ALKPHOS 67  BILITOT 0.3  PROT 5.3*  ALBUMIN 2.6*    CBG: Recent Labs  Lab 04/08/20 1702 04/08/20 1914 04/09/20 0038 04/09/20 0338 04/09/20 0742  GLUCAP 96 131* 153* 156* 136*     No results found for this or any previous visit (from the past 240 hour(s)).       Radiology Studies: CT HEAD WO CONTRAST  Result Date: 04/08/2020 CLINICAL DATA:  Stroke. Cerebritis. Cerebral abscess. Left-sided ptosis. EXAM: CT HEAD WITHOUT CONTRAST TECHNIQUE: Contiguous axial images were obtained from the base of the skull through the vertex without intravenous contrast. COMPARISON:  MR head without and with contrast 03/24/2020. CT head without contrast 03/26/2020. FINDINGS:  Brain: Previously noted fluid collections in the left operculum have significantly improved. No fluid levels or gas collections are present. Attenuation of the left lentiform nucleus is restored. Persistent hypoattenuation is present throughout the left parietal and occipital white matter. Left occipital cortical infarcts are noted. Midline shift of 2-3 mm is improved. Vascular: Atherosclerotic calcifications are present within the cavernous internal carotid arteries bilaterally. No hyperdense vessel is present. Skull: Left frontal burr hole is noted. Calvarium is otherwise intact. No significant extracranial soft tissue lesion is present. Sinuses/Orbits: Chronic right maxillary sinus specification is present. Bilateral mastoid effusions are present, left greater than right. Middle ear cavity is clear. No obstruction is present. Sinuses are otherwise clear. The globes and orbits are within normal limits. IMPRESSION: 1. Significantly improved fluid collections in the left operculum. No fluid levels or gas collections are present. 2. Persistent hypoattenuation throughout the left parietal and occipital white matter compatible with acute/subacute infarcts. 3. Left occipital cortical infarcts. 4. Improved midline shift of 2-3 mm. 5. Bilateral mastoid effusions, left greater than right. No obstruction is present. 6. Chronic right maxillary sinus disease. Electronically Signed   By: San Morelle M.D.   On: 04/08/2020 14:48        Scheduled Meds: . amLODipine  5 mg Per Tube Daily  . chlorhexidine gluconate (MEDLINE KIT)  15 mL Mouth Rinse BID  . Chlorhexidine Gluconate Cloth  6 each Topical Daily  . docusate  100 mg Per Tube BID  . feeding supplement (PROSource TF)  45 mL Per Tube BID  . heparin injection (subcutaneous)  5,000 Units Subcutaneous Q8H  . insulin aspart  0-20 Units Subcutaneous Q4H  . insulin aspart  3 Units Subcutaneous Q4H  . insulin glargine  20 Units Subcutaneous Daily  .  levETIRAcetam  500 mg Per Tube BID  . mouth rinse  15 mL Mouth Rinse 10 times per day  . pantoprazole sodium  40 mg Per Tube QHS  . polyethylene glycol  17 g Per Tube Daily  . sodium chloride flush  10-40 mL Intracatheter Q12H   Continuous Infusions: . cefTRIAXone (ROCEPHIN)  IV 2 g (04/09/20 0845)  . dextrose 5% lactated ringers 30 mL/hr at 04/09/20 0455  . feeding  supplement (JEVITY 1.2 CAL) 65 mL/hr at 04/08/20 2258     LOS: 20 days       Hosie Poisson, MD [facility]   To contact the attending provider between 7A-7P or the covering provider during after hours 7P-7A, please log into the web site [email or web address] and access using universal [employer] password for that web site. If you do not have the password, please call the hospital operator.  04/09/2020, 10:41 AM

## 2020-04-10 ENCOUNTER — Inpatient Hospital Stay (HOSPITAL_COMMUNITY): Payer: Medicare HMO

## 2020-04-10 DIAGNOSIS — G0490 Encephalitis and encephalomyelitis, unspecified: Secondary | ICD-10-CM

## 2020-04-10 DIAGNOSIS — R140 Abdominal distension (gaseous): Secondary | ICD-10-CM

## 2020-04-10 DIAGNOSIS — G060 Intracranial abscess and granuloma: Secondary | ICD-10-CM

## 2020-04-10 LAB — GLUCOSE, CAPILLARY
Glucose-Capillary: 125 mg/dL — ABNORMAL HIGH (ref 70–99)
Glucose-Capillary: 130 mg/dL — ABNORMAL HIGH (ref 70–99)
Glucose-Capillary: 162 mg/dL — ABNORMAL HIGH (ref 70–99)
Glucose-Capillary: 164 mg/dL — ABNORMAL HIGH (ref 70–99)
Glucose-Capillary: 173 mg/dL — ABNORMAL HIGH (ref 70–99)
Glucose-Capillary: 179 mg/dL — ABNORMAL HIGH (ref 70–99)

## 2020-04-10 IMAGING — DX DG ABDOMEN 1V
2 series · 2 of 2 positions shown · non-contrast
Comparison: CT [DATE]

CLINICAL DATA: Abdominal distension

EXAM:
ABDOMEN - 1 VIEW

[abdomen kub (1 of 2)]
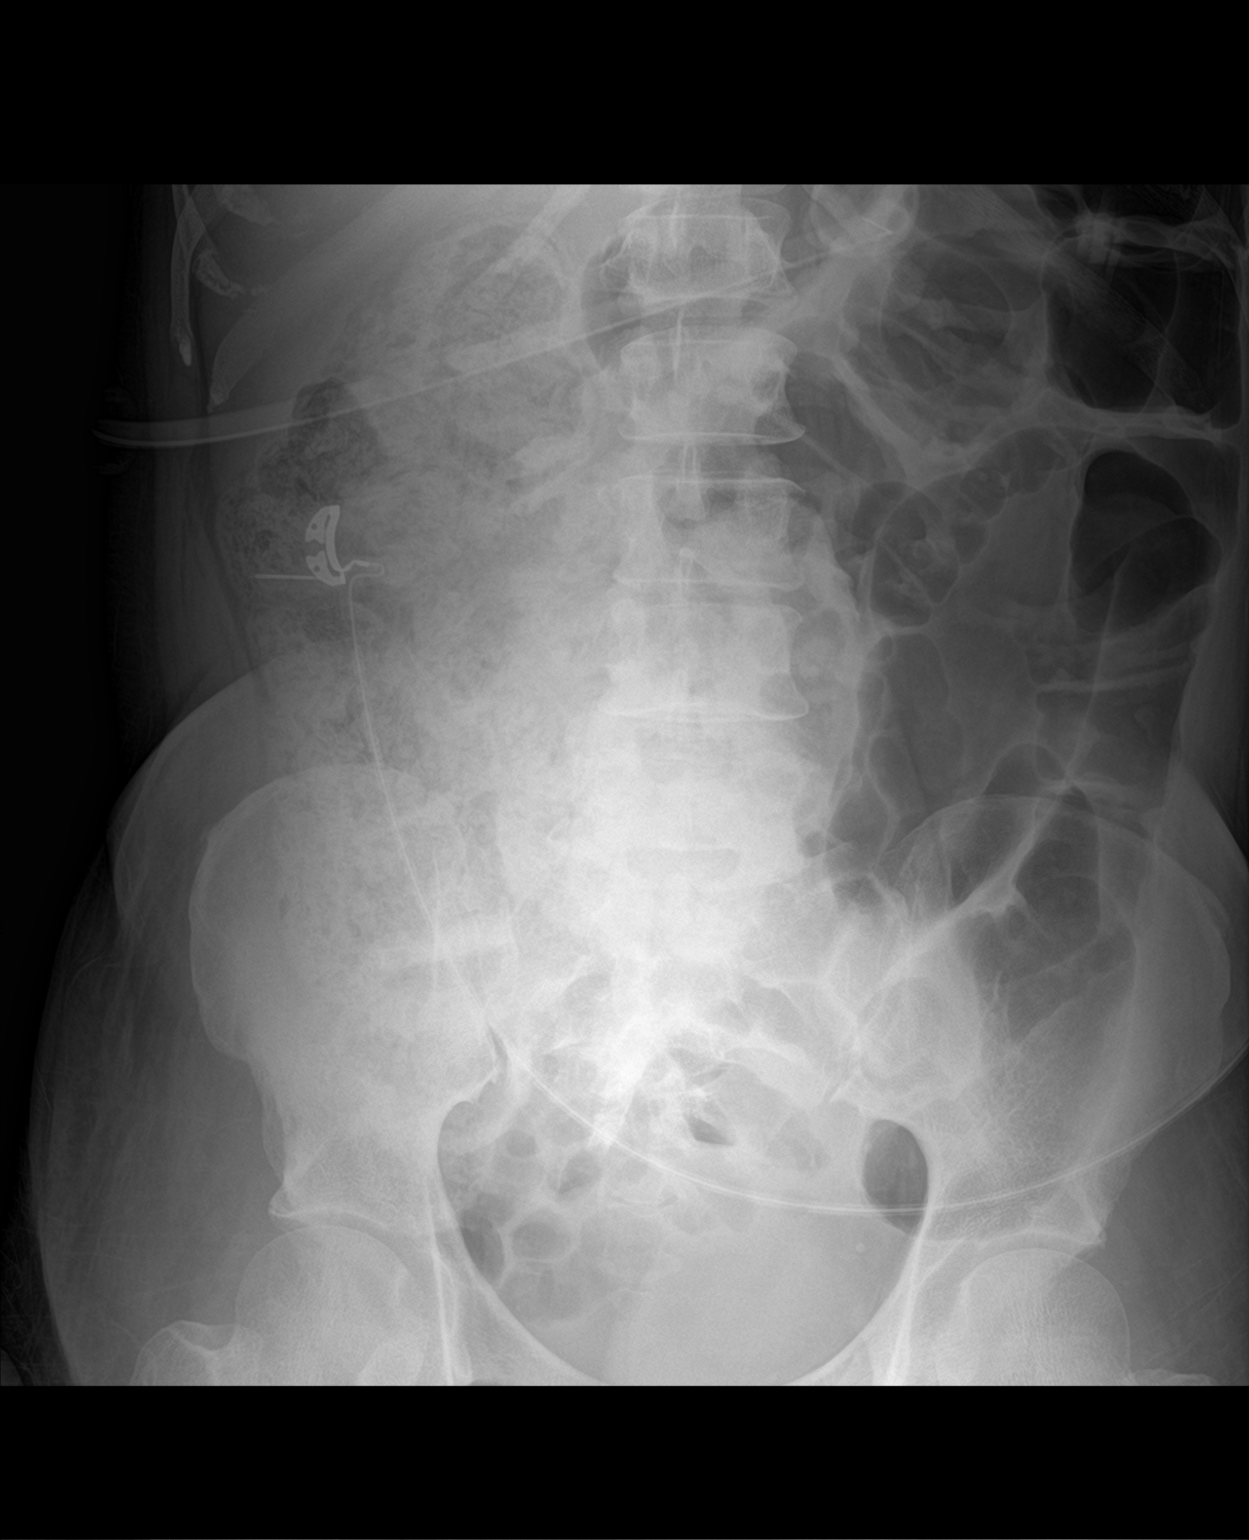

[abdomen kub (2 of 2)]
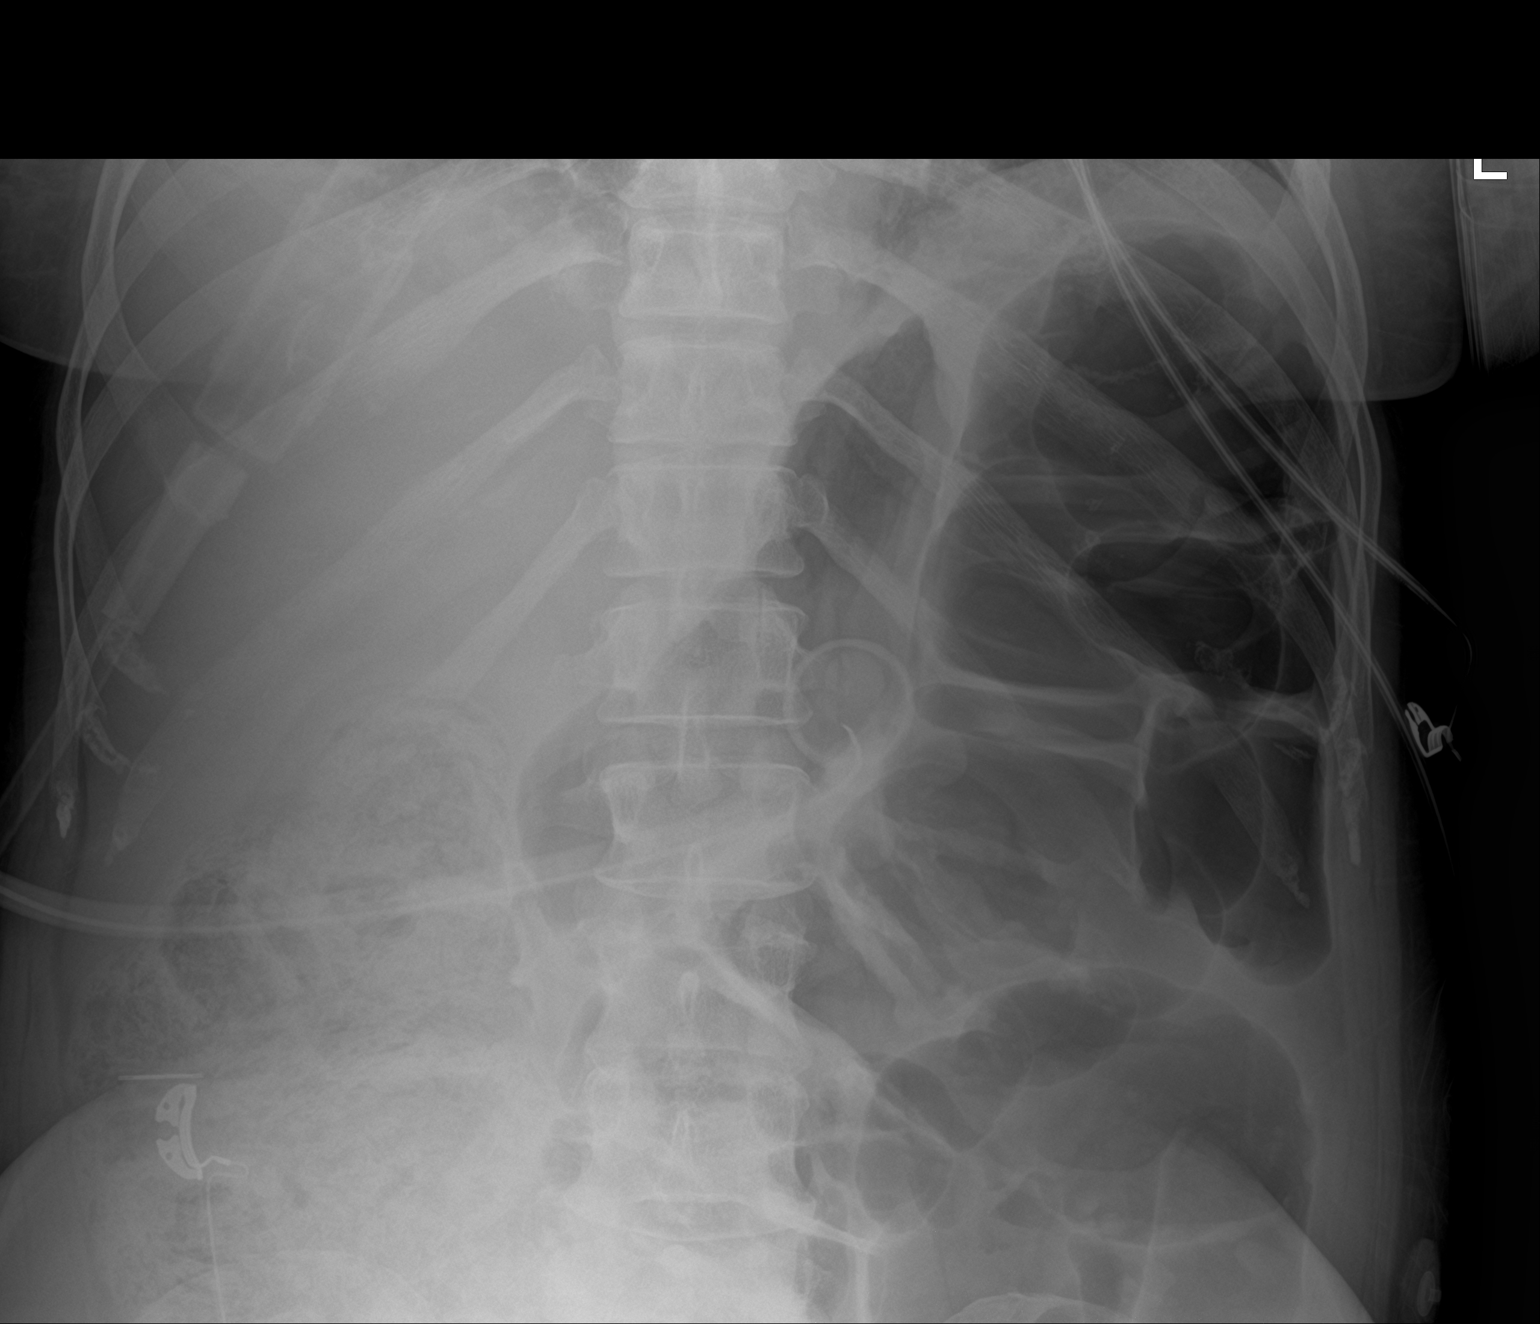

[2 of 2 positions shown; findings below may reference images not displayed]

FINDINGS: Large proximal colonic stool burden with air distention of the
distal colonic segments and some mild thickening of the haustra
distally. An air distended loop seen in the left lower quadrant is
present with lack of air and stool over the rectal vault.
Percutaneous feeding tube projects over left upper abdomen.
Atelectatic changes in the lung bases. No suspicious calcifications.
Mild degenerative changes in the spine and pelvis.
IMPRESSION: 1. Large proximal colonic stool burden with air distention of the
distal colonic segments and some suspect thickening of the haustra
distally. Air distended loop seen in the left lower quadrant may
reflect a distended sigmoid with lack of air and stool over the
rectal vault. Features could reflect constipation, though early
distal colonic obstruction/sigmoid volvulus could present with a
similar appearance.
2. Percutaneous gastrostomy in the left upper quadrant.

## 2020-04-10 MED ORDER — JEVITY 1.5 CAL/FIBER PO LIQD
1000.0000 mL | ORAL | Status: DC
  Administered 2020-04-10 – 2020-05-02 (×21): 1000 mL
  Filled 2020-04-10 (×18): qty 1000
  Filled 2020-04-10: qty 3000
  Filled 2020-04-10 (×15): qty 1000

## 2020-04-10 MED ORDER — PROSOURCE TF PO LIQD
45.0000 mL | Freq: Three times a day (TID) | ORAL | Status: DC
  Administered 2020-04-10 – 2020-05-03 (×70): 45 mL
  Filled 2020-04-10 (×68): qty 45

## 2020-04-10 MED ORDER — DOCUSATE SODIUM 283 MG RE ENEM
1.0000 | ENEMA | Freq: Once | RECTAL | Status: AC
  Administered 2020-04-11: 283 mg via RECTAL
  Filled 2020-04-10: qty 1

## 2020-04-10 NOTE — NC FL2 (Signed)
[place] MEDICAID FL2 LEVEL OF CARE SCREENING TOOL     IDENTIFICATION  Patient Name: Vickie Alvarado Birthdate: [birth year] Sex: female Admission Date (Current Location): 03/20/2020  [facility] and [place] Number:  Herbalist and Address:  [facility] [place] [place] [address], [place], [place] [address]      Provider Number: [id number]  Attending Physician Name and Address:  Desiree Hane, MD  Relative Name and Phone Number:  [name] daughter 1 [phone]    Current Level of Care: Hospital Recommended Level of Care: [place] Prior Approval Number:    Date Approved/Denied:   PASRR Number:    Discharge Plan: SNF    Current Diagnoses: Patient Active Problem List   Diagnosis Date Noted  . Cerebral embolism with cerebral infarction 03/24/2020  . Essential hypertension 03/21/2020  . Hyperlipidemia 03/21/2020  . Anxiety 03/21/2020  . Bipolar disorder ([place]) 03/21/2020  . Weakness of extremity 03/20/2020  . Brain mass   . Brain lesion     Orientation RESPIRATION BLADDER Height & Weight      (unable to assess  unresponsive)  Tracheostomy (O2 5L FiO2 28%) Incontinent Weight: 71.9 kg Height:  5' 7"  (170.2 cm)  BEHAVIORAL SYMPTOMS/MOOD NEUROLOGICAL BOWEL NUTRITION STATUS   (none)  (No HX noted) Incontinent  (Tube feeds see d/c summary)  AMBULATORY STATUS COMMUNICATION OF NEEDS Skin   Total Care Does not communicate PU Stage and Appropriate Care     PU Stage 3 Dressing: Daily (stage 3 noted to sacrum 6X4.5X0.2 Aquacel daily cover with foam dressing)                 Personal Care Assistance Level of Assistance  Bathing, Feeding, Dressing, Total care Bathing Assistance: Maximum assistance Feeding assistance:  (tube feeds) Dressing Assistance: Maximum assistance Total Care Assistance: Maximum assistance   Functional Limitations Info  Sight, Hearing, Speech Sight Info:  (unable to assess) Hearing Info:  (unable to  assess) Speech Info: Impaired (trach/ unresponsive)    SPECIAL CARE FACTORS FREQUENCY  PT (By licensed PT), OT (By licensed OT)     PT Frequency: 5X OT Frequency: 5X            Contractures Contractures Info: Not present    Additional Factors Info  Code Status, Allergies, Psychotropic, Insulin Sliding Scale, Isolation Precautions, Suctioning Needs Code Status Info: Full Allergies Info: Ibuprofen Psychotropic Info: Hx Bipolar & anxiety Insulin Sliding Scale Info: see d/c summary for sliding scale info Isolation Precautions Info: none Suctioning Needs: moderate suctioning needs with small to moderate secretions   Current Medications (04/10/2020):  This is the current hospital active medication list Current Facility-Administered Medications  Medication Dose Route Frequency Provider Last Rate Last Admin  . acetaminophen (TYLENOL) 160 MG/5ML solution 650 mg  650 mg Per Tube Q4H PRN Hosie Poisson, MD   650 mg at 03/24/20 1559  . acetaminophen (TYLENOL) tablet 650 mg  650 mg Oral Q4H PRN Hosie Poisson, MD       Or  . acetaminophen (TYLENOL) suppository 650 mg  650 mg Rectal Q4H PRN Hosie Poisson, MD      . amLODipine (NORVASC) tablet 5 mg  5 mg Per Tube Daily Hosie Poisson, MD   5 mg at 04/10/20 0843  . cefTRIAXone (ROCEPHIN) 2 g in sodium chloride 0.9 % 100 mL IVPB  2 g Intravenous Q12H Hosie Poisson, MD 200 mL/hr at 04/10/20 1326 2 g at 04/10/20 1326  . chlorhexidine gluconate (MEDLINE KIT) (  PERIDEX) 0.12 % solution 15 mL  15 mL Mouth Rinse BID Hosie Poisson, MD   15 mL at 04/10/20 0844  . Chlorhexidine Gluconate Cloth 2 % PADS 6 each  6 each Topical Daily Hosie Poisson, MD   6 each at 04/10/20 0844  . dextrose 5 % in lactated ringers infusion   Intravenous Continuous Hosie Poisson, MD 30 mL/hr at 04/10/20 0600 Rate Verify at 04/10/20 0600  . docusate (COLACE) 50 MG/5ML liquid 100 mg  100 mg Per Tube BID Hosie Poisson, MD   100 mg at 04/10/20 0842  . feeding supplement (JEVITY 1.5  CAL/FIBER) liquid 1,000 mL  1,000 mL Per Tube Continuous Oretha Milch D, MD 55 mL/hr at 04/10/20 1324 1,000 mL at 04/10/20 1324  . feeding supplement (PROSource TF) liquid 45 mL  45 mL Per Tube TID Oretha Milch D, MD      . fentaNYL (SUBLIMAZE) injection 50-200 mcg  50-200 mcg Intravenous Q30 min PRN Hosie Poisson, MD      . folic acid (FOLVITE) tablet 1 mg  1 mg Per Tube Daily Hosie Poisson, MD   1 mg at 04/10/20 0843  . heparin injection 5,000 Units  5,000 Units Subcutaneous Q8H Hosie Poisson, MD   5,000 Units at 04/10/20 0544  . hydrALAZINE (APRESOLINE) injection 10-20 mg  10-20 mg Intravenous Q6H PRN Hosie Poisson, MD   20 mg at 03/27/20 1103  . HYDROcodone-acetaminophen (NORCO/VICODIN) 5-325 MG per tablet 1 tablet  1 tablet Per Tube Q4H PRN Hosie Poisson, MD      . insulin aspart (novoLOG) injection 0-20 Units  0-20 Units Subcutaneous Q4H Hosie Poisson, MD   4 Units at 04/10/20 1259  . insulin aspart (novoLOG) injection 3 Units  3 Units Subcutaneous Q4H Hosie Poisson, MD   3 Units at 04/10/20 1300  . insulin glargine (LANTUS) injection 20 Units  20 Units Subcutaneous Daily Hosie Poisson, MD   20 Units at 04/10/20 0849  . labetalol (NORMODYNE) injection 10-40 mg  10-40 mg Intravenous Q10 min PRN Hosie Poisson, MD   20 mg at 03/26/20 1204  . levETIRAcetam (KEPPRA) 100 MG/ML solution 500 mg  500 mg Per Tube BID Hosie Poisson, MD   500 mg at 04/10/20 0849  . MEDLINE mouth rinse  15 mL Mouth Rinse 10 times per day Hosie Poisson, MD   15 mL at 04/10/20 1200  . ondansetron (ZOFRAN) injection 4 mg  4 mg Intravenous Q6H PRN Hosie Poisson, MD   4 mg at 03/29/20 1656  . pantoprazole sodium (PROTONIX) 40 mg/20 mL oral suspension 40 mg  40 mg Per Tube QHS Hosie Poisson, MD   40 mg at 04/09/20 2208  . polyethylene glycol (MIRALAX / GLYCOLAX) packet 17 g  17 g Per Tube Daily Hosie Poisson, MD   17 g at 04/10/20 0842  . polyethylene glycol (MIRALAX / GLYCOLAX) packet 17 g  17 g Per Tube Daily PRN Efraim Kaufmann, RPH      . promethazine (PHENERGAN) tablet 12.5-25 mg  12.5-25 mg Per Tube Q4H PRN Efraim Kaufmann, RPH      . sodium chloride flush (NS) 0.9 % injection 10-40 mL  10-40 mL Intracatheter Q12H Hosie Poisson, MD   10 mL at 04/10/20 0850  . sodium chloride flush (NS) 0.9 % injection 10-40 mL  10-40 mL Intracatheter PRN Hosie Poisson, MD         Discharge Medications: Please see discharge summary for a list of discharge medications.  Relevant Imaging  Results:  Relevant Lab Results:   Additional Information SS#[id number]  Angelita Ingles, RN

## 2020-04-10 NOTE — Progress Notes (Signed)
Pt had a stage 3 sacral pressure ulcer found during audit rounds. MD notified and nurse attempted to contact daughter but contact information was disconnected and no other contact was listed.

## 2020-04-10 NOTE — Progress Notes (Signed)
Nutrition Follow-up  DOCUMENTATION CODES:   Not applicable  INTERVENTION:  Change tube feeding: -Jevity 1.5 @ 55 ml/hr (1320 ml) via PEG -ProSource TF 45 ml TID  Tube feeding regimen provides 2100 kcal, 117 grams of protein, and 1003 ml of H2O.   NUTRITION DIAGNOSIS:   Increased nutrient needs related to post-op healing as evidenced by estimated needs.  Ongoing  GOAL:   Patient will meet greater than or equal to 90% of their needs  Addressed via TF  MONITOR:   TF tolerance, I & O's  REASON FOR ASSESSMENT:   Ventilator, Consult Enteral/tube feeding initiation and management  ASSESSMENT:   Patient with PMH significant for HTN, HLD, COVID 19 infection (03/01/20), and dental infections. Presents this admission with R sided hemiplegia and R sided facial droop. 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  8/27 - pt with abscess in L basal ganglia s/p L frontal biopsy/aspiration 9/06 - trach placed 9/09 - s/p PEG  CT head shows improved fluid collections and improved midline shift. New stg II sacral wound noted likely related to stool incontinence. Stool consistency shows to be liquid but still receiving miralax and colace daily?  Will discuss with MD. Tube feeding adjusted to promote healing.   Admission weight: 69.2 kg  Current weight: 71.9 kg   Drips: D5 @ 30 ml/hr  Medications: colace, folic acid, SS novolog, lantus, miralax Labs: CBG 136-179  Diet Order:   Diet Order            Diet NPO time specified  Diet effective midnight                 EDUCATION NEEDS:   Not appropriate for education at this time  Skin:  Skin Assessment: Skin Integrity Issues: Incisions: L head Stg III: coccyx  Last BM:  9/13  Height:   Ht Readings from Last 1 Encounters:  04/06/20 5\' 7"  (1.702 m)    Weight:   Wt Readings from Last 1 Encounters:  04/09/20 71.9 kg    BMI:  Body mass index is 24.83 kg/m.  Estimated Nutritional Needs:    Kcal:  2000-2200 kcal  Protein:  95-120 grams  Fluid:  >/= 2 L/day   04/11/20 RD, LDN Clinical Nutrition Pager listed in AMION

## 2020-04-10 NOTE — Consult Note (Addendum)
WOC Nurse Consult Note: Patient receiving care in room [facility] [id number] Reason for Consult: Sacral wound Wound type: Stage 3 PI Pressure Injury POA: No Measurement: 6 cm x 4.5 cm x 0.2 cm Wound bed: Pink and red Drainage (amount, consistency, odor) bloody on foam dressing with no odor Periwound: Intact Difficult to promote healing since patient is incontinent of stool. She has [place] in place.  Dressing procedure/placement/frequency: Clean sacral wound with soap and water. Pat dry. Place Aquacel [phone]) to sacrum every day, apply foam dressing to cover. Change foam dressing every 3 days or PRN soiling. Use NS to loosen Aquacel before removing.   Monitor the wound area(s) for worsening of condition such as: Signs/symptoms of infection,  Increase in size,  Development of or worsening of odor, Development of pain, or increased pain at the affected locations.   Notify the medical team if any of these develop.  Thank you for the consult. WOC nurse will assess the wound weekly for any needs in change in treatment that may be necessary Please re-consult the WOC team if needed.  [name] [name], MSN, RN, CMSRN, Angus Seller, [facility] Wound Treatment Associate Office [phone]  Pager [phone]

## 2020-04-10 NOTE — Progress Notes (Addendum)
[facility]  PROGRESS NOTE  Vickie Alvarado ZYS:[id number] DOB: May 28, 1966 DOA: 03/20/2020 PCP: [facility] Admit date - 03/20/2020   Admitting Physician Jacky Kindle, MD  Outpatient Primary MD for the patient is [place], [facility]  LOS - 21 Brief Narrative   Vickie Alvarado is a 54 y.o. 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  She underwent stereotactic biopsy and aspiration of the abscess by neurosurgery on 03/22/2020 and was transferred to ICU.    Hospital course complicated by worsening mental status requiring MRI which showed new large left PCA infarct along with scattered infarcts in the right basal ganglia and right thalamus concerning for either CVA related to septic emboli versus herniation syndrome.  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 Given her large hemisphere brain abscess with surrounding edema/cerebritis neurosurgery did not recommend decompressive surger  Underwent  PEG placement on 9/9 with tube feeds starting at that time.  CT head shows improvement in fluid collections shows persistent left parietal/occipital acute/subacute infarcts with improvement in midline shift.  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  Status post serial tactic biopsy/aspiration by neurosurgery on 03/22/2020.  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  Neurologic exam is stable (opens eyes).  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  X-ray shows large proximal colonic stool burden with air distention of distal colonic segments suspect thickening of the hospital distally, reflective of constipation or other developing colonic obstruction/sigmoid volvulus -Hold further tube feeds -Optimize bowel regimen disease(currently on docusate 50 mg twice daily)-- add enema and monitor output -Hold tube feeds while improving bowel regimen  Acute hypoxic respiratory failure, status post trach 04/01/2020, stable 5 L of O2 on trach collar -We will need to be cuffless prior to discharge  HTN, stable -Continue Amlodipine 5 mg  Nutrition -Continue Prosource via feeding tube  GERD, stable-continue PPI per tube  Type 2 diabetes -Lantus 20 units daily, NovoLog 3 units every 4 hours while on tube feeds -Continue IV Rocephin, end date 05/17/2020 for a total of 8  weeks  Anemia of chronic disease.  Folate levels negative, adequate iron/ferritin -Continue folate supplementation, monitor hemoglobin  History of bipolar disorder, stable -Not on home Seroquel here  Pressure ulcer in gluteal cleft/sacral area,  consistent with stage 2 on my exam (no full loss of dermis, no fat exposure) difficult to promote healing given patient is incontinent of stool -Wound consulted, follow recommendations for cleaning, and foam dressing to sacrum every day    Family Communication  : spoke with daughter at bedside  Code Status : Full  Disposition Plan  :  Patient is from home. Anticipated d/c date: 2 to 3 days. Barriers to d/c or necessity for inpatient status:  Currently optimizing bowel regimen as patient has significant stool burden with concern for early signs of obstruction in the colonic region, need to optimize bowel regimen, currently requiring IV ceftriaxone for abscess Consults  : Neurosurgery, ID, PCCM, IR  Procedures  :    DVT Prophylaxis  : Heparin  Lab Results  Component Value Date   PLT 156 04/07/2020    Diet :  Diet Order            Diet NPO time specified  Diet effective midnight                  Inpatient Medications Scheduled Meds: . amLODipine  5 mg Per Tube Daily  . chlorhexidine gluconate (MEDLINE KIT)  15 mL Mouth Rinse BID  . Chlorhexidine Gluconate Cloth  6 each Topical Daily  . docusate  100 mg Per Tube BID  . feeding supplement (PROSource TF)  45 mL Per Tube TID  . folic acid  1 mg Per Tube Daily  . heparin injection (subcutaneous)  5,000 Units Subcutaneous Q8H  . insulin aspart  0-20 Units Subcutaneous Q4H  . insulin aspart  3 Units Subcutaneous Q4H  . insulin glargine  20 Units Subcutaneous Daily  . levETIRAcetam  500 mg Per Tube BID  . mouth rinse  15 mL Mouth Rinse 10 times per day  . pantoprazole sodium  40 mg Per Tube QHS  . polyethylene glycol  17 g Per Tube Daily  . sodium chloride flush  10-40 mL  Intracatheter Q12H   Continuous Infusions: . cefTRIAXone (ROCEPHIN)  IV 2 g (04/10/20 1326)  . dextrose 5% lactated ringers 30 mL/hr at 04/10/20 0600  . feeding supplement (JEVITY 1.5 CAL/FIBER) 1,000 mL (04/10/20 1324)   PRN Meds:.acetaminophen (TYLENOL) oral liquid 160 mg/5 mL, acetaminophen **OR** acetaminophen, fentaNYL (SUBLIMAZE) injection, hydrALAZINE, HYDROcodone-acetaminophen, labetalol, ondansetron (ZOFRAN) IV, polyethylene glycol, promethazine, sodium chloride flush  Antibiotics  :   Anti-infectives (From admission, onward)   Start     Dose/Rate Route Frequency Ordered Stop   04/04/20 1000  ceFAZolin (ANCEF) IVPB 2g/100 mL premix        2 g 200 mL/hr over 30 Minutes Intravenous To Radiology 04/04/20 0935 04/04/20 1053   04/03/20 0600  ceFAZolin (ANCEF) IVPB 1 g/50 mL premix        1 g 100 mL/hr over 30 Minutes Intravenous To Radiology 04/01/20 1239 04/04/20 0600   03/24/20 1030  vancomycin (VANCOREADY) IVPB 1500 mg/300 mL  Status:  Discontinued        1,500 mg 150 mL/hr over 120 Minutes Intravenous Every 8 hours 03/24/20 0952 03/24/20 1336   03/23/20 0130  vancomycin (VANCOCIN) IVPB 1000 mg/200 mL premix  Status:  Discontinued        1,000 mg 200 mL/hr over 60 Minutes Intravenous Every 8 hours 03/22/20 1647  03/24/20 0952   03/22/20 2200  cefTRIAXone (ROCEPHIN) 2 g in sodium chloride 0.9 % 100 mL IVPB        2 g 200 mL/hr over 30 Minutes Intravenous Every 12 hours 03/22/20 1617     03/22/20 1730  vancomycin (VANCOREADY) IVPB 1750 mg/350 mL        1,750 mg 175 mL/hr over 120 Minutes Intravenous  Once 03/22/20 1647 03/22/20 2030   03/22/20 1630  metroNIDAZOLE (FLAGYL) IVPB 500 mg  Status:  Discontinued        500 mg 100 mL/hr over 60 Minutes Intravenous Every 8 hours 03/22/20 1618 03/24/20 1350   03/22/20 1336  bacitracin 50,000 Units in sodium chloride 0.9 % 500 mL irrigation  Status:  Discontinued          As needed 03/22/20 1336 03/22/20 1341   03/22/20 1315  vancomycin  (VANCOCIN) IVPB 1000 mg/200 mL premix  Status:  Discontinued        1,000 mg 200 mL/hr over 60 Minutes Intravenous To Surgery 03/22/20 1313 03/22/20 1545   03/22/20 1315  ceFEPIme (MAXIPIME) 2 g in sodium chloride 0.9 % 100 mL IVPB        2 g 200 mL/hr over 30 Minutes Intravenous To Surgery 03/22/20 1313 03/22/20 1354   03/22/20 1145  ceFAZolin (ANCEF) IVPB 2g/100 mL premix  Status:  Discontinued        2 g 200 mL/hr over 30 Minutes Intravenous  Once 03/22/20 1131 03/22/20 1545   03/22/20 1138  ceFAZolin (ANCEF) 2-4 GM/100ML-% IVPB       Note to Pharmacy: Gleason, Ginger   : cabinet override      03/22/20 1138 03/22/20 2344       Objective   Vitals:   04/10/20 1118 04/10/20 1130 04/10/20 1519 04/10/20 1557  BP: 116/72  122/76   Pulse: 93 87 89 76  Resp: 13 20  20   Temp: 98.9 F (37.2 C)  98.7 F (37.1 C)   TempSrc: Oral  Axillary   SpO2: 100% 100% 100% 100%  Weight:      Height:        SpO2: 100 % O2 Flow Rate (L/min): 5 L/min FiO2 (%): 28 %  Wt Readings from Last 3 Encounters:  04/09/20 71.9 kg  03/01/20 88 kg     Intake/Output Summary (Last 24 hours) at 04/10/2020 1744 Last data filed at 04/10/2020 0311 Gross per 24 hour  Intake 60 ml  Output 50 ml  Net 10 ml    Physical Exam:  Opens eyes, not following to voice/commands Lying comfortably, in no distress Normal respiratory effort, normal breath sounds Abdomen distended but soft, normal bowel sounds. Feeding tube in place      I have personally reviewed the following:   Data Reviewed:  CBC Recent Labs  Lab 04/04/20 1446 04/07/20 0435  WBC 10.7* 5.9  HGB 11.6* 10.0*  HCT 35.8* 30.4*  PLT 199 156  MCV 91.3 92.1  MCH 29.6 30.3  MCHC 32.4 32.9  RDW 17.0* 17.2*  LYMPHSABS 1.1  --   MONOABS 0.7  --   EOSABS 0.0  --   BASOSABS 0.0  --     Chemistries  Recent Labs  Lab 04/04/20 1530 04/07/20 0435 04/07/20 1444 04/08/20 0500 04/08/20 1235  NA 134* 135  --   --   --   K 3.8 3.3*  --   4.2  --   CL 103 104  --   --   --  CO2 22 23  --   --   --   GLUCOSE 208* 168*  --   --   --   BUN 15 8  --   --   --   CREATININE 0.34* 0.31*  --   --   --   CALCIUM 8.2* 7.0*  --   --   --   MG  --   --  1.5*  --  1.8  AST 16  --   --   --   --   ALT 48*  --   --   --   --   ALKPHOS 67  --   --   --   --   BILITOT 0.3  --   --   --   --    ------------------------------------------------------------------------------------------------------------------ No results for input(s): CHOL, HDL, LDLCALC, TRIG, CHOLHDL, LDLDIRECT in the last 72 hours.  Lab Results  Component Value Date   HGBA1C 11.5 (H) 03/22/2020   ------------------------------------------------------------------------------------------------------------------ No results for input(s): TSH, T4TOTAL, T3FREE, THYROIDAB in the last 72 hours.  Invalid input(s): FREET3 ------------------------------------------------------------------------------------------------------------------ Recent Labs    04/08/20 1014 04/08/20 1235  VITAMINB12  --  1,168*  FOLATE  --  5.9*  FERRITIN  --  760*  TIBC  --  225*  IRON  --  36  RETICCTPCT 3.0  --     Coagulation profile No results for input(s): INR, PROTIME in the last 168 hours.  No results for input(s): DDIMER in the last 72 hours.  Cardiac Enzymes No results for input(s): CKMB, TROPONINI, MYOGLOBIN in the last 168 hours.  Invalid input(s): CK ------------------------------------------------------------------------------------------------------------------ No results found for: BNP  Micro Results No results found for this or any previous visit (from the past 240 hour(s)).  Radiology Reports CT Angio Head W/Cm &/Or Wo Cm  Result Date: 03/20/2020 CLINICAL DATA:  Acute neuro deficit with right-sided weakness. EXAM: CT ANGIOGRAPHY HEAD AND NECK TECHNIQUE: Multidetector CT imaging of the head and neck was performed using the standard protocol during bolus administration  of intravenous contrast. Multiplanar CT image reconstructions and MIPs were obtained to evaluate the vascular anatomy. Carotid stenosis measurements (when applicable) are obtained utilizing NASCET criteria, using the distal internal carotid diameter as the denominator. CONTRAST:  54m OMNIPAQUE IOHEXOL 350 MG/ML SOLN COMPARISON:  None. FINDINGS: CT HEAD FINDINGS Brain: Large mass lesion in the left frontal lobe. Delayed imaging reveals irregular enhancement of the periphery with central necrosis. The enhancing component of the mass measures approximately 53 x 28 mm. Surrounding low-density edema/tumor seen around the enhancement. Low-density extends into the left basal ganglia. There is compression of the left lateral ventricle and 7 mm midline shift to the right. No acute hemorrhage. Negative for hydrocephalus Vascular: Negative for hyperdense vessel Skull: Negative Sinuses: Complete opacification right maxillary sinus with bony thickening. Mucosal edema extends into the right frontal and right ethmoid sinus. Remaining sinuses clear. Mastoid clear bilaterally. Orbits: Negative Review of the MIP images confirms the above findings CTA NECK FINDINGS Aortic arch: Normal aortic arch. Azygos branching pattern. Proximal great vessels widely patent. Right carotid system: Right carotid widely patent. Mild noncalcified plaque in the right carotid bulb. Left carotid system: Left carotid widely patent. No significant stenosis or atherosclerotic disease. Vertebral arteries: Both vertebral arteries are widely patent to the basilar without stenosis. Skeleton: Disc degeneration and spurring C5-6 and C6-7. No acute skeletal abnormality. Poor dentition. Other neck: Negative for mass or adenopathy. Upper chest: Lung apices clear bilaterally Review of the  MIP images confirms the above findings CTA HEAD FINDINGS Anterior circulation: Mild atherosclerotic calcification in the cavernous carotid without stenosis. Right middle cerebral  artery widely patent. Hypoplastic right A1 segment. Azygos anterior cerebral artery arising from the left without significant stenosis. There is displacement and stretching of the left MCA branches due to the large left frontal mass. Posterior circulation: Both vertebral arteries patent to the basilar. PICA patent bilaterally. Basilar widely patent. AICA, superior cerebellar, and posterior cerebral arteries patent bilaterally without stenosis. Venous sinuses: Normal venous enhancement Anatomic variants: None Review of the MIP images confirms the above findings IMPRESSION: 1. Large mass lesion left frontal lobe compatible with tumor. Favor glioblastoma. 2. Negative for acute infarct or hemorrhage 3. No significant carotid or vertebral artery stenosis in the neck. 4. Negative for intracranial large vessel occlusion. Electronically Signed   By: Franchot Gallo M.D.   On: 03/20/2020 13:29   DG Abd 1 View  Result Date: 04/10/2020 CLINICAL DATA:  Abdominal distension EXAM: ABDOMEN - 1 VIEW COMPARISON:  CT 12/19/2019 FINDINGS: Large proximal colonic stool burden with air distention of the distal colonic segments and some mild thickening of the haustra distally. An air distended loop seen in the left lower quadrant is present with lack of air and stool over the rectal vault. Percutaneous feeding tube projects over left upper abdomen. Atelectatic changes in the lung bases. No suspicious calcifications. Mild degenerative changes in the spine and pelvis. IMPRESSION: 1. Large proximal colonic stool burden with air distention of the distal colonic segments and some suspect thickening of the haustra distally. Air distended loop seen in the left lower quadrant may reflect a distended sigmoid with lack of air and stool over the rectal vault. Features could reflect constipation, though early distal colonic obstruction/sigmoid volvulus could present with a similar appearance. 2. Percutaneous gastrostomy in the left upper quadrant.  Electronically Signed   By: Lovena Le M.D.   On: 04/10/2020 15:57   CT HEAD WO CONTRAST  Result Date: 04/08/2020 CLINICAL DATA:  Stroke. Cerebritis. Cerebral abscess. Left-sided ptosis. EXAM: CT HEAD WITHOUT CONTRAST TECHNIQUE: Contiguous axial images were obtained from the base of the skull through the vertex without intravenous contrast. COMPARISON:  MR head without and with contrast 03/24/2020. CT head without contrast 03/26/2020. FINDINGS: Brain: Previously noted fluid collections in the left operculum have significantly improved. No fluid levels or gas collections are present. Attenuation of the left lentiform nucleus is restored. Persistent hypoattenuation is present throughout the left parietal and occipital white matter. Left occipital cortical infarcts are noted. Midline shift of 2-3 mm is improved. Vascular: Atherosclerotic calcifications are present within the cavernous internal carotid arteries bilaterally. No hyperdense vessel is present. Skull: Left frontal burr hole is noted. Calvarium is otherwise intact. No significant extracranial soft tissue lesion is present. Sinuses/Orbits: Chronic right maxillary sinus specification is present. Bilateral mastoid effusions are present, left greater than right. Middle ear cavity is clear. No obstruction is present. Sinuses are otherwise clear. The globes and orbits are within normal limits. IMPRESSION: 1. Significantly improved fluid collections in the left operculum. No fluid levels or gas collections are present. 2. Persistent hypoattenuation throughout the left parietal and occipital white matter compatible with acute/subacute infarcts. 3. Left occipital cortical infarcts. 4. Improved midline shift of 2-3 mm. 5. Bilateral mastoid effusions, left greater than right. No obstruction is present. 6. Chronic right maxillary sinus disease. Electronically Signed   By: San Morelle M.D.   On: 04/08/2020 14:48   CT HEAD WO CONTRAST  Result Date:  03/26/2020 CLINICAL DATA:  Cerebral edema.  Status post biopsy. EXAM: CT HEAD WITHOUT CONTRAST TECHNIQUE: Contiguous axial images were obtained from the base of the skull through the vertex without intravenous contrast. COMPARISON:  None. FINDINGS: Brain: Decreased pneumocephalus at the left frontal biopsy site. The degree of edema throughout the left hemisphere has improved slightly. But, there is a new left posterior cerebral artery territory infarct. Rightward midline shift measures 6 mm at the level of the foramina of [place]. There is improved patency of the basal cisterns. No hydrocephalus. Vascular: No hyperdense vessel or unexpected calcification. Skull: Normal. Negative for fracture or focal lesion. Sinuses/Orbits: Complete opacification of the right frontal and maxillary sinuses. Normal orbits. Other: None IMPRESSION: 1. Slightly improved edema throughout the left hemisphere with 6 mm rightward midline shift. Improved patency of the basal cisterns. 2. Left posterior cerebral artery territory infarct. Electronically Signed   By: Ulyses Jarred M.D.   On: 03/26/2020 01:51   CT HEAD WO CONTRAST  Result Date: 03/22/2020 CLINICAL DATA:  Mental status change. Recent diagnosis of brain lesion post biopsy. EXAM: CT HEAD WITHOUT CONTRAST TECHNIQUE: Contiguous axial images were obtained from the base of the skull through the vertex without intravenous contrast. COMPARISON:  CT 8 hours ago.  Brain MRI earlier today. FINDINGS: Brain: Stable size and appearance of the left brain lesion centered in the basal ganglia. Stable internal air or Gel-Foam. Stable regional mass effect. Left-to-right midline shift of 9 mm, previously 11 mm. No evidence of lesional or acute hemorrhage. Stable sulcal effacement. Lacunar infarct in the right thalamus better appreciated on prior MRI. Stable ventricular size. Basilar cisterns are patent. No developing subdural collection. Vascular: No hyperdense vessel. Skull: Left frontal burr  hole.  No fracture or focal lesion. Sinuses/Orbits: Unchanged appearance of right maxillary and frontal sinusitis with opacification of anterior ethmoid air cells. Other: Post biopsy changes in the left frontal scalp. IMPRESSION: 1. Stable size and appearance of the left brain lesion centered in the basal ganglia with internal air or Gel-Foam. Stable regional mass effect. Slightly diminished left-to-right midline shift of 9 mm, previously 11 mm. 2. No evidence of hemorrhage or new abnormality. Electronically Signed   By: Keith Rake M.D.   On: 03/22/2020 23:33   CT HEAD WO CONTRAST  Result Date: 03/22/2020 CLINICAL DATA:  Follow-up brain biopsy. EXAM: CT HEAD WITHOUT CONTRAST TECHNIQUE: Contiguous axial images were obtained from the base of the skull through the vertex without intravenous contrast. COMPARISON:  MRI earlier same day.  CT yesterday. FINDINGS: Brain: Left frontal burr hole. Biopsy site with air or Gel-Foam in the region of the previous tumor with the epicenter in the left basal ganglia. No evidence of postsurgical hemorrhage. No change in regional mass effect with left-to-right shift 11 mm. No extra-axial collection. Vascular: No abnormal vascular finding. Skull: Otherwise negative Sinuses/Orbits: Ostiomeatal unit pattern of the paranasal sinuses on the right. Other: None IMPRESSION: Biopsy site with air or Gel-Foam in the region of the previous tumor with the epicenter in the left basal ganglia. No evidence of postsurgical hemorrhage. No change in regional mass effect with left-to-right shift 11 mm. Electronically Signed   By: Nelson Chimes M.D.   On: 03/22/2020 15:58   CT HEAD WO CONTRAST  Result Date: 03/21/2020 CLINICAL DATA:  Brain mass. EXAM: CT HEAD WITHOUT CONTRAST TECHNIQUE: Contiguous axial images were obtained from the base of the skull through the vertex without intravenous contrast. COMPARISON:  Brain MRI from yesterday FINDINGS:  Brain: Known mass with central low-density and  extensive hemispheric low-density and swelling centered in the deep left cerebrum. Dimensions were better assessed on postcontrast imaging from yesterday. No interval hemorrhage or herniation. Midline shift measures 1 cm without entrapment. Vascular: Negative Skull: Negative Sinuses/Orbits: Chronic right-sided sinusitis with obstruction at the level of the middle meatus. Other: Motion artifact IMPRESSION: 1. BrainLAB protocol but motion artifact at the vertex and skull base due to confusion. If not adequate for intraoperative localization a sedated scan may be required. 2. No new findings when compared to preceding brain MRI. Electronically Signed   By: Monte Fantasia M.D.   On: 03/21/2020 11:32   CT Angio Neck W and/or Wo Contrast  Result Date: 03/20/2020 CLINICAL DATA:  Acute neuro deficit with right-sided weakness. EXAM: CT ANGIOGRAPHY HEAD AND NECK TECHNIQUE: Multidetector CT imaging of the head and neck was performed using the standard protocol during bolus administration of intravenous contrast. Multiplanar CT image reconstructions and MIPs were obtained to evaluate the vascular anatomy. Carotid stenosis measurements (when applicable) are obtained utilizing NASCET criteria, using the distal internal carotid diameter as the denominator. CONTRAST:  2m OMNIPAQUE IOHEXOL 350 MG/ML SOLN COMPARISON:  None. FINDINGS: CT HEAD FINDINGS Brain: Large mass lesion in the left frontal lobe. Delayed imaging reveals irregular enhancement of the periphery with central necrosis. The enhancing component of the mass measures approximately 53 x 28 mm. Surrounding low-density edema/tumor seen around the enhancement. Low-density extends into the left basal ganglia. There is compression of the left lateral ventricle and 7 mm midline shift to the right. No acute hemorrhage. Negative for hydrocephalus Vascular: Negative for hyperdense vessel Skull: Negative Sinuses: Complete opacification right maxillary sinus with bony  thickening. Mucosal edema extends into the right frontal and right ethmoid sinus. Remaining sinuses clear. Mastoid clear bilaterally. Orbits: Negative Review of the MIP images confirms the above findings CTA NECK FINDINGS Aortic arch: Normal aortic arch. Azygos branching pattern. Proximal great vessels widely patent. Right carotid system: Right carotid widely patent. Mild noncalcified plaque in the right carotid bulb. Left carotid system: Left carotid widely patent. No significant stenosis or atherosclerotic disease. Vertebral arteries: Both vertebral arteries are widely patent to the basilar without stenosis. Skeleton: Disc degeneration and spurring C5-6 and C6-7. No acute skeletal abnormality. Poor dentition. Other neck: Negative for mass or adenopathy. Upper chest: Lung apices clear bilaterally Review of the MIP images confirms the above findings CTA HEAD FINDINGS Anterior circulation: Mild atherosclerotic calcification in the cavernous carotid without stenosis. Right middle cerebral artery widely patent. Hypoplastic right A1 segment. Azygos anterior cerebral artery arising from the left without significant stenosis. There is displacement and stretching of the left MCA branches due to the large left frontal mass. Posterior circulation: Both vertebral arteries patent to the basilar. PICA patent bilaterally. Basilar widely patent. AICA, superior cerebellar, and posterior cerebral arteries patent bilaterally without stenosis. Venous sinuses: Normal venous enhancement Anatomic variants: None Review of the MIP images confirms the above findings IMPRESSION: 1. Large mass lesion left frontal lobe compatible with tumor. Favor glioblastoma. 2. Negative for acute infarct or hemorrhage 3. No significant carotid or vertebral artery stenosis in the neck. 4. Negative for intracranial large vessel occlusion. Electronically Signed   By: CFranchot GalloM.D.   On: 03/20/2020 13:29   MR ANGIO HEAD WO CONTRAST  Result Date:  03/24/2020 CLINICAL DATA:  54year old female with history of brain abscess, status post stereotactic aspiration/drainage. EXAM: MRI HEAD WITHOUT AND WITH CONTRAST MRA HEAD WITHOUT CONTRAST TECHNIQUE: Multiplanar,  multiecho pulse sequences of the brain and surrounding structures were obtained without and with intravenous contrast. Angiographic images of the head were obtained using MRA technique without contrast. CONTRAST:  46m GADAVIST GADOBUTROL 1 MMOL/ML IV SOLN COMPARISON:  Prior MRI from 03/22/2020 and 03/20/2020. FINDINGS: MRI HEAD FINDINGS Brain: Postoperative changes from interval stereotactic aspiration of previously identified lesion centered at the left basal ganglia is seen. Aspiration tract with a small amount of susceptibility artifacts seen extending via a left frontal approach. The lesion at the left basal ganglia is slightly decreased in size now measuring 5.1 x 2.4 x 2.7 cm, previously 6.0 x 2.4 x 3.5 cm. Associated internal fluid-fluid level with restricted diffusion. Small focus of pneumocephalus at the nondependent portion of this lesion. Per history, this lesion was found to be most consistent with an intracerebral infection/abscess. Persistent surrounding vasogenic edema throughout the adjacent left cerebral hemisphere with regional mass effect and effacement of the left lateral ventricle. Edema extends into the left cerebral peduncle and left midbrain, similar to previous. Associated left-to-right shift has worsened now measuring up to 10 mm, previously 6 mm. No overt hydrocephalus or ventricular trapping at this time. Crowding of the basilar cisterns with mass effect on the adjacent midbrain, similar to slightly worsened. No transtentorial herniation. 1 cm right thalamic infarct is similar to previous. No associated hemorrhage. Interval development of confluent restricted diffusion throughout the left temporal occipital region, left PCA distribution, consistent with acute ischemia. No  associated hemorrhage. Multiple additional new patchy ischemic infarcts involving the bilateral thalami, splenium, parasagittal right temporal occipital region, and left dorsal pons. Right thalamic ischemic changes extend into the right midbrain. These infarcts involve the posterior circulation. An additional curvilinear infarct extending from the right lentiform nucleus towards the right caudate noted as well, favored to be anterior distribution (series 6, image 76). Patchy restricted diffusion also seen in the region of the left hypothalamus (series 6, image 69). Additional small cortical infarct noted at the posterior right frontotemporal region (series 6, image 80). Associated mild petechial hemorrhage at the left dorsal pons without hemorrhagic transformation. Findings favored to be embolic in nature. Mild left a meningeal enhancement noted involving the parasagittal left occipital region (series 29, image 7). While this finding could be reactive in nature related to adjacent left PCA territory infarct, a degree of underlying left a meningeal meningitis related to infection could also be considered. Pituitary stalk is thickened and enhancing as well. Vascular: Major intracranial vascular flow voids are maintained. Normal opacification seen throughout the major dural sinuses following contrast administration. Skull and upper cervical spine: Craniocervical junction within normal limits. No transtentorial herniation. Bone marrow signal intensity normal. No scalp soft tissue abnormality. Sinuses/Orbits: Globes and orbital soft tissues within normal limits. Extensive paranasal sinus disease involving the right frontoethmoidal and right maxillary sinuses noted. Fluid seen within the nasopharynx. Small bilateral mastoid effusions. Patient is intubated. Other: None. MRA HEAD FINDINGS ANTERIOR CIRCULATION: Examination degraded by motion artifact. Visualized distal cervical segments of the internal carotid arteries are  patent with symmetric antegrade flow. Petrous, cavernous, and supraclinoid ICAs patent without stenosis or other abnormality. Left A1 patent. Right A1 hypoplastic and/or absent, accounting for the diminutive right ICA is compared to the left. Widely patent azygos ACA. No M1 stenosis or occlusion. Normal MCA bifurcations. Distal MCA branches well perfused and symmetric. POSTERIOR CIRCULATION: Vertebral arteries patent to the vertebrobasilar junction without stenosis. Right PICA patent. Left PICA not seen. Basilar mildly irregular but patent to its distal aspect without  high-grade stenosis. Superior cerebral arteries patent bilaterally. Both PCAs primarily supplied via the basilar. Irregularity about the PCAs bilaterally with associated mild to moderate stenoses, greater on the left. Specific note made of a moderate left P2 stenosis (series 28, image 14) PCAs remain well perfused to their distal aspects. No aneurysm. IMPRESSION: MRI HEAD IMPRESSION: 1. Postoperative changes from stereotactic aspiration/drainage of left basal ganglia lesion without complication. Lesion is decreased in size now measuring 5.1 x 2.4 x 2.7 cm. Per history, this is consistent with and intracerebral abscess. 2. Persistent associated vasogenic edema throughout the left cerebral hemisphere with mildly worsened 10 mm left-to-right shift. 3. Interval development of multifocal predominantly posterior circulation infarcts, including a large left PCA territory infarct. Minimal petechial hemorrhage at the left pons without hemorrhagic transformation. A central thromboembolic source is suspected. 4. Patchy leptomeningeal enhancement within the posterior left occipital region. While this may be reactive to the adjacent left PCA territory infarct, a degree of leptomeningeal meningitis related to intracerebral infection could also be considered. 5. Extensive right-sided paranasal sinus disease. MRA HEAD IMPRESSION: 1. Negative intracranial MRA for large  vessel occlusion. 2. Scattered vascular irregularity involving the basilar and bilateral PCAs without occlusion. No proximal high-grade or correctable stenosis. No aneurysm. Findings discussed by called by telephone on 03/24/2020 at approximately 5:30 am with provider Dr. Rory Percy. Electronically Signed   By: Jeannine Boga M.D.   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 EXAM: MRI HEAD WITHOUT AND WITH CONTRAST MRA HEAD WITHOUT CONTRAST TECHNIQUE: Multiplanar, multiecho pulse sequences of the brain and surrounding structures were obtained without and with intravenous contrast. Angiographic images of the head were obtained using MRA technique without contrast. CONTRAST:  98m GADAVIST GADOBUTROL 1 MMOL/ML IV SOLN COMPARISON:  Prior MRI from 03/22/2020 and 03/20/2020. FINDINGS: MRI HEAD FINDINGS Brain: Postoperative changes from interval stereotactic aspiration of previously identified lesion centered at the left basal ganglia is seen. Aspiration tract with a small amount of susceptibility artifacts seen extending via a left frontal approach. The lesion at the left basal ganglia is slightly decreased in size now measuring 5.1 x 2.4 x 2.7 cm, previously 6.0 x 2.4 x 3.5 cm. Associated internal fluid-fluid level with restricted diffusion. Small focus of pneumocephalus at the nondependent portion of this lesion. Per history, this lesion was found to be most consistent with an intracerebral infection/abscess. Persistent surrounding vasogenic edema throughout the adjacent left cerebral hemisphere with regional mass effect and effacement of the left lateral ventricle. Edema extends into the left cerebral peduncle and left midbrain, similar to previous. Associated left-to-right shift has worsened now measuring up to 10 mm, previously 6 mm. No overt hydrocephalus or ventricular  trapping at this time. Crowding of the basilar cisterns with mass effect on the adjacent midbrain, similar to slightly worsened. No transtentorial herniation. 1 cm right thalamic infarct is similar to previous. No associated hemorrhage. Interval development of confluent restricted diffusion throughout the left temporal occipital region, left PCA distribution, consistent with acute ischemia. No associated hemorrhage. Multiple additional new patchy ischemic infarcts involving the bilateral thalami, splenium, parasagittal right temporal occipital region, and left dorsal pons. Right thalamic ischemic changes extend into the right midbrain. These infarcts involve the posterior circulation. An additional curvilinear infarct extending from the right lentiform nucleus towards the right caudate noted as well, favored to be anterior distribution (series 6, image 76). Patchy restricted diffusion also  seen in the region of the left hypothalamus (series 6, image 69). Additional small cortical infarct noted at the posterior right frontotemporal region (series 6, image 80). Associated mild petechial hemorrhage at the left dorsal pons without hemorrhagic transformation. Findings favored to be embolic in nature. Mild left a meningeal enhancement noted involving the parasagittal left occipital region (series 29, image 7). While this finding could be reactive in nature related to adjacent left PCA territory infarct, a degree of underlying left a meningeal meningitis related to infection could also be considered. Pituitary stalk is thickened and enhancing as well. Vascular: Major intracranial vascular flow voids are maintained. Normal opacification seen throughout the major dural sinuses following contrast administration. Skull and upper cervical spine: Craniocervical junction within normal limits. No transtentorial herniation. Bone marrow signal intensity normal. No scalp soft tissue abnormality. Sinuses/Orbits: Globes and orbital  soft tissues within normal limits. Extensive paranasal sinus disease involving the right frontoethmoidal and right maxillary sinuses noted. Fluid seen within the nasopharynx. Small bilateral mastoid effusions. Patient is intubated. Other: None. MRA HEAD FINDINGS ANTERIOR CIRCULATION: Examination degraded by motion artifact. Visualized distal cervical segments of the internal carotid arteries are patent with symmetric antegrade flow. Petrous, cavernous, and supraclinoid ICAs patent without stenosis or other abnormality. Left A1 patent. Right A1 hypoplastic and/or absent, accounting for the diminutive right ICA is compared to the left. Widely patent azygos ACA. No M1 stenosis or occlusion. Normal MCA bifurcations. Distal MCA branches well perfused and symmetric. POSTERIOR CIRCULATION: Vertebral arteries patent to the vertebrobasilar junction without stenosis. Right PICA patent. Left PICA not seen. Basilar mildly irregular but patent to its distal aspect without high-grade stenosis. Superior cerebral arteries patent bilaterally. Both PCAs primarily supplied via the basilar. Irregularity about the PCAs bilaterally with associated mild to moderate stenoses, greater on the left. Specific note made of a moderate left P2 stenosis (series 28, image 14) PCAs remain well perfused to their distal aspects. No aneurysm. IMPRESSION: MRI HEAD IMPRESSION: 1. Postoperative changes from stereotactic aspiration/drainage of left basal ganglia lesion without complication. Lesion is decreased in size now measuring 5.1 x 2.4 x 2.7 cm. Per history, this is consistent with and intracerebral abscess. 2. Persistent associated vasogenic edema throughout the left cerebral hemisphere with mildly worsened 10 mm left-to-right shift. 3. Interval development of multifocal predominantly posterior circulation infarcts, including a large left PCA territory infarct. Minimal petechial hemorrhage at the left pons without hemorrhagic transformation. A  central thromboembolic source is suspected. 4. Patchy leptomeningeal enhancement within the posterior left occipital region. While this may be reactive to the adjacent left PCA territory infarct, a degree of leptomeningeal meningitis related to intracerebral infection could also be considered. 5. Extensive right-sided paranasal sinus disease. MRA HEAD IMPRESSION: 1. Negative intracranial MRA for large vessel occlusion. 2. Scattered vascular irregularity involving the basilar and bilateral PCAs without occlusion. No proximal high-grade or correctable stenosis. No aneurysm. Findings discussed by called by telephone on 03/24/2020 at approximately 5:30 am with provider Dr. Rory Percy. Electronically Signed   By: Jeannine Boga M.D.   On: 03/24/2020 06:44   MR BRAIN W WO CONTRAST  Result Date: 03/22/2020 CLINICAL DATA:  Brain mass EXAM: MRI HEAD WITHOUT AND WITH CONTRAST TECHNIQUE: Multiplanar, multiecho pulse sequences of the brain and surrounding structures were obtained without and with intravenous contrast. CONTRAST:  58m GADAVIST GADOBUTROL 1 MMOL/ML IV SOLN COMPARISON:  03/20/2020 FINDINGS: Brain: When accounting for differences in technique (axial scan angle is slightly different), similar size of the mass centered within the left  basal ganglia, with the enhancing portion measuring up to approximately 6.4 x 2.8 by 3.6 cm. Redemonstrated nodular peripheral enhancement with central necrosis. Similar exuberant surrounding T2/stir hyperintensity with marked effacement left lateral ventricle and approximately 9 mm of rightward midline shift at the foramina [place]. No progressive ventriculomegaly. The mass restricts diffusion peripherally without central restricted diffusion. Additionally, there is a new area of restricted diffusion within the right thalamus, compatible with acute infarct (series 550, image 28). Mild associated T2/STIR hyperintensity. Vascular: Limited evaluation; however, major vessels at the base  of the brain demonstrate normal flow voids. Skull and upper cervical spine: Negative Sinuses/Orbits: There is complete opacification of the right frontal, anterior ethmoid air cells, and right maxillary sinus, compatible with ostiomeatal unit pattern sinus disease. Other: Small left mastoid effusion. IMPRESSION: 1. New/acute infarct within the right thalamus. 2. 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 Resulting effacement of the left lateral ventricle and approximately 9 mm of right midline shift, not substantially changed. No progressive ventriculomegaly. 3. Similar right-sided ostiomeatal unit pattern paranasal sinusitis. Critical Value/emergent results were called by telephone at the time of interpretation on 03/22/2020 at 10:35 am to provider Dr. Reatha Armour, who verbally acknowledged these results. Electronically Signed   By: Margaretha Sheffield MD   On: 03/22/2020 10:39   MR Brain W and Wo Contrast  Result Date: 03/20/2020 CLINICAL DATA:  Acute right-sided weakness. Brain tumor shown by CT angiography. EXAM: MRI HEAD WITHOUT AND WITH CONTRAST TECHNIQUE: Multiplanar, multiecho pulse sequences of the brain and surrounding structures were obtained without and with intravenous contrast. CONTRAST:  8.39m GADAVIST GADOBUTROL 1 MMOL/ML IV SOLN COMPARISON:  CT studies earlier same day FINDINGS: Brain: Approximately 6.5 X 4.5 x 4 cm tumor with the epicenter in the left basal ganglia and radiating white matter tracts region. Central necrosis. Contrast enhancement along the margins of the necrosis. The enhancing region measures 6 x 2.4 x 3.5 cm. There is mass effect with flattening of the left lateral ventricle. Left-to-right midline shift of 6 mm. No ventricular trapping. No satellite lesion identified. No second  brain mass. No evidence of ischemic stroke. Vascular: Major vessels at the base of the brain show flow. Skull and upper cervical spine: Negative Sinuses/Orbits: Ostiomeatal unit pattern on the right with opacification of the right maxillary, ethmoid and frontal sinuses. Orbits negative. Other: None IMPRESSION: 6.5 x 4.5 x 4 cm tumor with the epicenter in the left basal ganglia and radiating white matter tracts. Central necrosis. Contrast enhancement along the margins of the necrosis. The enhancing region measures 6 x 2.4 x 3.5 cm. There is mass effect with flattening of the left lateral ventricle. Left-to-right midline shift of 6 mm. No ventricular trapping. No satellite lesion identified. 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 Right-sided paranasal sinusitis with ostiomeatal unit pattern. Electronically Signed   By: MNelson ChimesM.D.   On: 03/20/2020 15:44   IR GASTROSTOMY TUBE MOD SED  Result Date: 04/04/2020 INDICATION: Dysphagia. Please perform percutaneous gastrostomy tube for enteric nutrition supplementation purposes. EXAM: PULL TROUGH GASTROSTOMY TUBE PLACEMENT COMPARISON:  CT abdomen and pelvis-03/20/2020 MEDICATIONS: Ancef 2 gm IV; Antibiotics were administered within 1 hour of the procedure. Glucagon 1 mg IV CONTRAST:  20 mL of Omnipaque 300 administered into the gastric lumen. ANESTHESIA/SEDATION: Moderate (conscious) sedation was employed during this procedure. A total of  Versed 1 mg and Fentanyl 50 mcg was administered intravenously. Moderate Sedation Time: 20 minutes. The patient's level of consciousness and vital signs were monitored continuously by radiology nursing throughout the procedure under my direct supervision. FLUOROSCOPY TIME:  1 minutes, 12 seconds (5 mGy) COMPLICATIONS: None immediate. PROCEDURE: Informed written consent was obtained from the patient's family following explanation of the procedure, risks, benefits and alternatives. A time out was  performed prior to the initiation of the procedure. Ultrasound scanning was performed to demarcate the edge of the left lobe of the liver. Maximal barrier sterile technique utilized including caps, mask, sterile gowns, sterile gloves, large sterile drape, hand hygiene and Betadine prep. The left upper quadrant was sterilely prepped and draped. An oral gastric catheter was inserted into the stomach under fluoroscopy. The existing nasogastric feeding tube was removed. The left costal margin and air opacified transverse colon were identified and avoided. Air was injected into the stomach for insufflation and visualization under fluoroscopy. Under sterile conditions a 17 gauge trocar needle was utilized to access the stomach percutaneously beneath the left subcostal margin after the overlying soft tissues were anesthetized with 1% Lidocaine with epinephrine. Needle position was confirmed within the stomach with aspiration of air and injection of small amount of contrast. A single T tack was deployed for gastropexy. Over an Amplatz guide wire, a 9-French sheath was inserted into the stomach. A snare device was utilized to capture the oral gastric catheter. The snare device was pulled retrograde from the stomach up the esophagus and out the oropharynx. The 20-French pull-through gastrostomy was connected to the snare device and pulled antegrade through the oropharynx down the esophagus into the stomach and then through the percutaneous tract external to the patient. The gastrostomy was assembled externally. Contrast injection confirms position in the stomach. Several spot radiographic images were obtained in various obliquities for documentation. The patient tolerated procedure well without immediate post procedural complication. FINDINGS: After successful fluoroscopic guided placement, the gastrostomy tube is appropriately positioned with internal disc against the ventral aspect of the gastric lumen. IMPRESSION:  Successful fluoroscopic insertion of a 20-French pull-through gastrostomy tube. The gastrostomy may be used immediately for medication administration and in 24 hrs for the initiation of feeds. Electronically Signed   By: Sandi Mariscal M.D.   On: 04/04/2020 14:48   CT CHEST ABDOMEN PELVIS W CONTRAST  Result Date: 03/20/2020 CLINICAL DATA:  54 year old female with brain mass. Evaluate for metastatic disease. EXAM: CT CHEST, ABDOMEN, AND PELVIS WITH CONTRAST TECHNIQUE: Multidetector CT imaging of the chest, abdomen and pelvis was performed following the standard protocol during bolus administration of intravenous contrast. CONTRAST:  173m OMNIPAQUE IOHEXOL 350 MG/ML SOLN COMPARISON:  None. FINDINGS: CT CHEST FINDINGS Cardiovascular: There is no cardiomegaly or pericardial effusion. There is noncalcified plaque along the thoracic aorta and aortic arch. No aneurysmal dilatation or dissection. The origins of the great vessels of the aortic arch appear patent. The central pulmonary arteries are unremarkable for the degree of opacification. Mediastinum/Nodes: Top-normal right hilar lymph nodes. No adenopathy. The esophagus and the thyroid gland are grossly unremarkable. No mediastinal fluid collection. Lungs/Pleura: There is a patchy area of consolidation with air bronchograms involving the right lower lobe which may represent atelectasis but concerning for pneumonia. Aspiration is not excluded. Clinical correlation and follow-up to resolution recommended to exclude an underlying mass. Faint left lung base linear and platelike atelectasis noted. There is no pleural effusion or pneumothorax. The central airways are patent. Musculoskeletal: No acute osseous pathology. CT  ABDOMEN PELVIS FINDINGS No intra-abdominal free air. There is a small free fluid in the pelvis. Hepatobiliary: The liver is unremarkable. No intrahepatic biliary ductal dilatation. The gallbladder is unremarkable. Pancreas: Unremarkable. No pancreatic  ductal dilatation or surrounding inflammatory changes. Spleen: Normal in size without focal abnormality. Adrenals/Urinary Tract: The adrenal glands are unremarkable. There is no hydronephrosis on either side. There is symmetric enhancement and excretion of contrast by both kidneys. The visualized ureters and urinary bladder appear unremarkable. Stomach/Bowel: There is moderate stool throughout the colon. There is no bowel obstruction or active inflammation. The appendix is normal. Vascular/Lymphatic: The abdominal aorta and IVC are unremarkable. No portal venous gas. There is no adenopathy. Reproductive: Hysterectomy. There is a 9 mm calcific focus in the right posterior pelvis which is not evaluated but may be associated with the ovary. Pelvic ultrasound may provide better evaluation. Other: Anterior pelvic wall surgical scar.  No fluid collection. Musculoskeletal: Degenerative changes of the spine. No acute osseous pathology. IMPRESSION: 1. Right lower lobe consolidation concerning for pneumonia. Aspiration is not excluded. Clinical correlation and follow-up to resolution recommended to exclude an underlying mass. 2. No evidence of metastatic disease in the chest, abdomen or pelvis. 3. An indeterminate 9 mm calcific focus in the right posterior pelvis may be associated with the ovary. Pelvic ultrasound may provide better evaluation. 4. Aortic Atherosclerosis (ICD10-I70.0). Electronically Signed   By: Anner Crete M.D.   On: 03/20/2020 19:52   DG Chest Port 1 View  Result Date: 03/28/2020 CLINICAL DATA:  Respiratory failure.  Hypoxia.  COVID positive. EXAM: PORTABLE CHEST 1 VIEW COMPARISON:  03/27/2020. FINDINGS: Endotracheal tube and NG tube in stable position. Right PICC line stable position. Heart size stable. Low lung volumes with persistent bibasilar atelectasis/infiltrates. Interim improvement in aeration from prior exam. No prominent pleural effusion. No pneumothorax. IMPRESSION: 1.  Lines and tubes  stable position. 2. Low lung volumes with persistent bibasilar atelectasis/infiltrates. Interim improvement in aeration from prior exam. Electronically Signed   By: Marcello Moores  Register   On: 03/28/2020 06:14   DG CHEST PORT 1 VIEW  Result Date: 03/27/2020 CLINICAL DATA:  Respiratory failure. EXAM: PORTABLE CHEST 1 VIEW COMPARISON:  03/23/2020 FINDINGS: 0523 hours. Endotracheal tube tip has pulled back in the interval and is now approximately 6.3 cm above the base of the carina. The NG tube passes into the stomach although the distal tip position is not included on the film. Interval improvement in basilar aeration with some persistent/residual streaky opacity in the bases, likely atelectasis. No pulmonary edema or pleural effusion. The visualized bony structures of the thorax show no acute abnormality. Telemetry leads overlie the chest. IMPRESSION: 1. Interval repositioning of the endotracheal tube. 2. Interval improvement in basilar aeration with some residual probable atelectasis at the lung bases. Electronically Signed   By: Misty Stanley M.D.   On: 03/27/2020 07:25   Portable Chest x-ray  Result Date: 03/23/2020 CLINICAL DATA:  54 year old female status post intubation. EXAM: PORTABLE CHEST 1 VIEW COMPARISON:  Chest CT dated 03/20/2020. FINDINGS: Endotracheal tube with tip at the level of the carina tilting towards the right mainstem bronchus. Recommend retraction by approximately 4 cm for optimal positioning. Enteric tube extends below the diaphragm with tip beyond the inferior margin of the image. Bilateral streaky and hazy airspace densities may represent atelectasis or pneumonia. No large pleural effusion. No pneumothorax. The cardiac silhouette is within limits. No acute osseous pathology. IMPRESSION: 1. Endotracheal tube with tip at the level of the carina tilting towards  the right mainstem bronchus. Recommend retraction by 4 cm for optimal positioning. 2. Bilateral streaky and hazy airspace densities  may represent atelectasis or pneumonia. These results were called by telephone at the time of interpretation on 03/23/2020 at 12:57 am to nurse Purcell Nails, who verbally acknowledged these results. Electronically Signed   By: Anner Crete M.D.   On: 03/23/2020 01:03   [place] EKG SITE RITE  Result Date: 03/23/2020 If [facility] image not attached, placement could not be confirmed due to current cardiac rhythm.    Time Spent in minutes  >30     Desiree Hane M.D on 04/10/2020 at 5:44 PM  To page go to [email or web address] - password [facility]

## 2020-04-10 NOTE — [place] FL2 (Signed)
[place] MEDICAID FL2 LEVEL OF CARE SCREENING TOOL     IDENTIFICATION  Patient Name: Vickie Alvarado Birthdate: [birth year] Sex: female Admission Date (Current Location): 03/20/2020  [facility] and [place] Number:  Herbalist and Address:  [facility], [place] [address] [place], [place] [address]      Provider Number: [id number]  Attending Physician Name and Address:  Desiree Hane, MD  Relative Name and Phone Number:  [name] daughter [phone]    Current Level of Care: Hospital Recommended Level of Care: [place] Prior Approval Number:    Date Approved/Denied:   PASRR Number:    Discharge Plan: SNF    Current Diagnoses: Patient Active Problem List   Diagnosis Date Noted  . Cerebral embolism with cerebral infarction 03/24/2020  . Essential hypertension 03/21/2020  . Hyperlipidemia 03/21/2020  . Anxiety 03/21/2020  . Bipolar disorder ([place]) 03/21/2020  . Weakness of extremity 03/20/2020  . Brain mass   . Brain lesion     Orientation RESPIRATION BLADDER Height & Weight      (unable to assess  unresponsive)  Tracheostomy (O2 5L FiO2 28%) Incontinent Weight: 158 lb 8.2 oz (71.9 kg) Height:  5' 7"  (170.2 cm)  BEHAVIORAL SYMPTOMS/MOOD NEUROLOGICAL BOWEL NUTRITION STATUS   (none)  (No HX noted) Incontinent  (Tube feeds see d/c summary)  AMBULATORY STATUS COMMUNICATION OF NEEDS Skin   Total Care Does not communicate PU Stage and Appropriate Care     PU Stage 3 Dressing: Daily (stage 3 noted to sacrum 6X4.5X0.2 Aquacel daily cover with foam dressing)                 Personal Care Assistance Level of Assistance  Bathing, Feeding, Dressing, Total care Bathing Assistance: Maximum assistance Feeding assistance:  (tube feeds) Dressing Assistance: Maximum assistance Total Care Assistance: Maximum assistance   Functional Limitations Info  Sight, Hearing, Speech Sight Info:  (unable to assess) Hearing Info:   (unable to assess) Speech Info: Impaired (trach/ unresponsive)    SPECIAL CARE FACTORS FREQUENCY  PT (By licensed PT), OT (By licensed OT)     PT Frequency: 5X OT Frequency: 5X            Contractures Contractures Info: Not present    Additional Factors Info  Code Status, Allergies, Psychotropic, Insulin Sliding Scale, Isolation Precautions, Suctioning Needs Code Status Info: Full Allergies Info: Ibuprofen Psychotropic Info: Hx Bipolar & anxiety Insulin Sliding Scale Info: see d/c summary for sliding scale info Isolation Precautions Info: none Suctioning Needs: moderate suctioning needs with small to moderate secretions   Current Medications (04/10/2020):  This is the current hospital active medication list Current Facility-Administered Medications  Medication Dose Route Frequency Provider Last Rate Last Admin  . acetaminophen (TYLENOL) 160 MG/5ML solution 650 mg  650 mg Per Tube Q4H PRN Hosie Poisson, MD   650 mg at 03/24/20 1559  . acetaminophen (TYLENOL) tablet 650 mg  650 mg Oral Q4H PRN Hosie Poisson, MD       Or  . acetaminophen (TYLENOL) suppository 650 mg  650 mg Rectal Q4H PRN Hosie Poisson, MD      . amLODipine (NORVASC) tablet 5 mg  5 mg Per Tube Daily Hosie Poisson, MD   5 mg at 04/10/20 0843  . cefTRIAXone (ROCEPHIN) 2 g in sodium chloride 0.9 % 100 mL IVPB  2 g Intravenous Q12H Hosie Poisson, MD 200 mL/hr at 04/10/20 1326 2 g at 04/10/20 1326  .  chlorhexidine gluconate (MEDLINE KIT) (PERIDEX) 0.12 % solution 15 mL  15 mL Mouth Rinse BID Hosie Poisson, MD   15 mL at 04/10/20 0844  . Chlorhexidine Gluconate Cloth 2 % PADS 6 each  6 each Topical Daily Hosie Poisson, MD   6 each at 04/10/20 0844  . dextrose 5 % in lactated ringers infusion   Intravenous Continuous Hosie Poisson, MD 30 mL/hr at 04/10/20 0600 Rate Verify at 04/10/20 0600  . docusate (COLACE) 50 MG/5ML liquid 100 mg  100 mg Per Tube BID Hosie Poisson, MD   100 mg at 04/10/20 0842  . feeding supplement  (JEVITY 1.5 CAL/FIBER) liquid 1,000 mL  1,000 mL Per Tube Continuous Oretha Milch D, MD 55 mL/hr at 04/10/20 1324 1,000 mL at 04/10/20 1324  . feeding supplement (PROSource TF) liquid 45 mL  45 mL Per Tube TID Oretha Milch D, MD      . fentaNYL (SUBLIMAZE) injection 50-200 mcg  50-200 mcg Intravenous Q30 min PRN Hosie Poisson, MD      . folic acid (FOLVITE) tablet 1 mg  1 mg Per Tube Daily Hosie Poisson, MD   1 mg at 04/10/20 0843  . heparin injection 5,000 Units  5,000 Units Subcutaneous Q8H Hosie Poisson, MD   5,000 Units at 04/10/20 0544  . hydrALAZINE (APRESOLINE) injection 10-20 mg  10-20 mg Intravenous Q6H PRN Hosie Poisson, MD   20 mg at 03/27/20 1103  . HYDROcodone-acetaminophen (NORCO/VICODIN) 5-325 MG per tablet 1 tablet  1 tablet Per Tube Q4H PRN Hosie Poisson, MD      . insulin aspart (novoLOG) injection 0-20 Units  0-20 Units Subcutaneous Q4H Hosie Poisson, MD   4 Units at 04/10/20 1259  . insulin aspart (novoLOG) injection 3 Units  3 Units Subcutaneous Q4H Hosie Poisson, MD   3 Units at 04/10/20 1300  . insulin glargine (LANTUS) injection 20 Units  20 Units Subcutaneous Daily Hosie Poisson, MD   20 Units at 04/10/20 0849  . labetalol (NORMODYNE) injection 10-40 mg  10-40 mg Intravenous Q10 min PRN Hosie Poisson, MD   20 mg at 03/26/20 1204  . levETIRAcetam (KEPPRA) 100 MG/ML solution 500 mg  500 mg Per Tube BID Hosie Poisson, MD   500 mg at 04/10/20 0849  . MEDLINE mouth rinse  15 mL Mouth Rinse 10 times per day Hosie Poisson, MD   15 mL at 04/10/20 1200  . ondansetron (ZOFRAN) injection 4 mg  4 mg Intravenous Q6H PRN Hosie Poisson, MD   4 mg at 03/29/20 1656  . pantoprazole sodium (PROTONIX) 40 mg/20 mL oral suspension 40 mg  40 mg Per Tube QHS Hosie Poisson, MD   40 mg at 04/09/20 2208  . polyethylene glycol (MIRALAX / GLYCOLAX) packet 17 g  17 g Per Tube Daily Hosie Poisson, MD   17 g at 04/10/20 0842  . polyethylene glycol (MIRALAX / GLYCOLAX) packet 17 g  17 g Per Tube Daily  PRN Efraim Kaufmann, RPH      . promethazine (PHENERGAN) tablet 12.5-25 mg  12.5-25 mg Per Tube Q4H PRN Efraim Kaufmann, RPH      . sodium chloride flush (NS) 0.9 % injection 10-40 mL  10-40 mL Intracatheter Q12H Hosie Poisson, MD   10 mL at 04/10/20 0850  . sodium chloride flush (NS) 0.9 % injection 10-40 mL  10-40 mL Intracatheter PRN Hosie Poisson, MD         Discharge Medications: Please see discharge summary for a list of discharge  medications.  Relevant Imaging Results:  Relevant Lab Results:   Additional Information [id number]  Joanne Chars, LCSW

## 2020-04-11 ENCOUNTER — Inpatient Hospital Stay (HOSPITAL_COMMUNITY): Payer: Medicare HMO

## 2020-04-11 LAB — GLUCOSE, CAPILLARY
Glucose-Capillary: 78 mg/dL (ref 70–99)
Glucose-Capillary: 136 mg/dL — ABNORMAL HIGH (ref 70–99)
Glucose-Capillary: 103 mg/dL — ABNORMAL HIGH (ref 70–99)
Glucose-Capillary: 127 mg/dL — ABNORMAL HIGH (ref 70–99)
Glucose-Capillary: 116 mg/dL — ABNORMAL HIGH (ref 70–99)
Glucose-Capillary: 157 mg/dL — ABNORMAL HIGH (ref 70–99)

## 2020-04-11 IMAGING — DX DG ABDOMEN 1V
1 series · 1 of 1 positions shown · non-contrast
Comparison: [DATE]

CLINICAL DATA: Abdominal distension.

EXAM:
ABDOMEN - 1 VIEW

[abdomen kub]
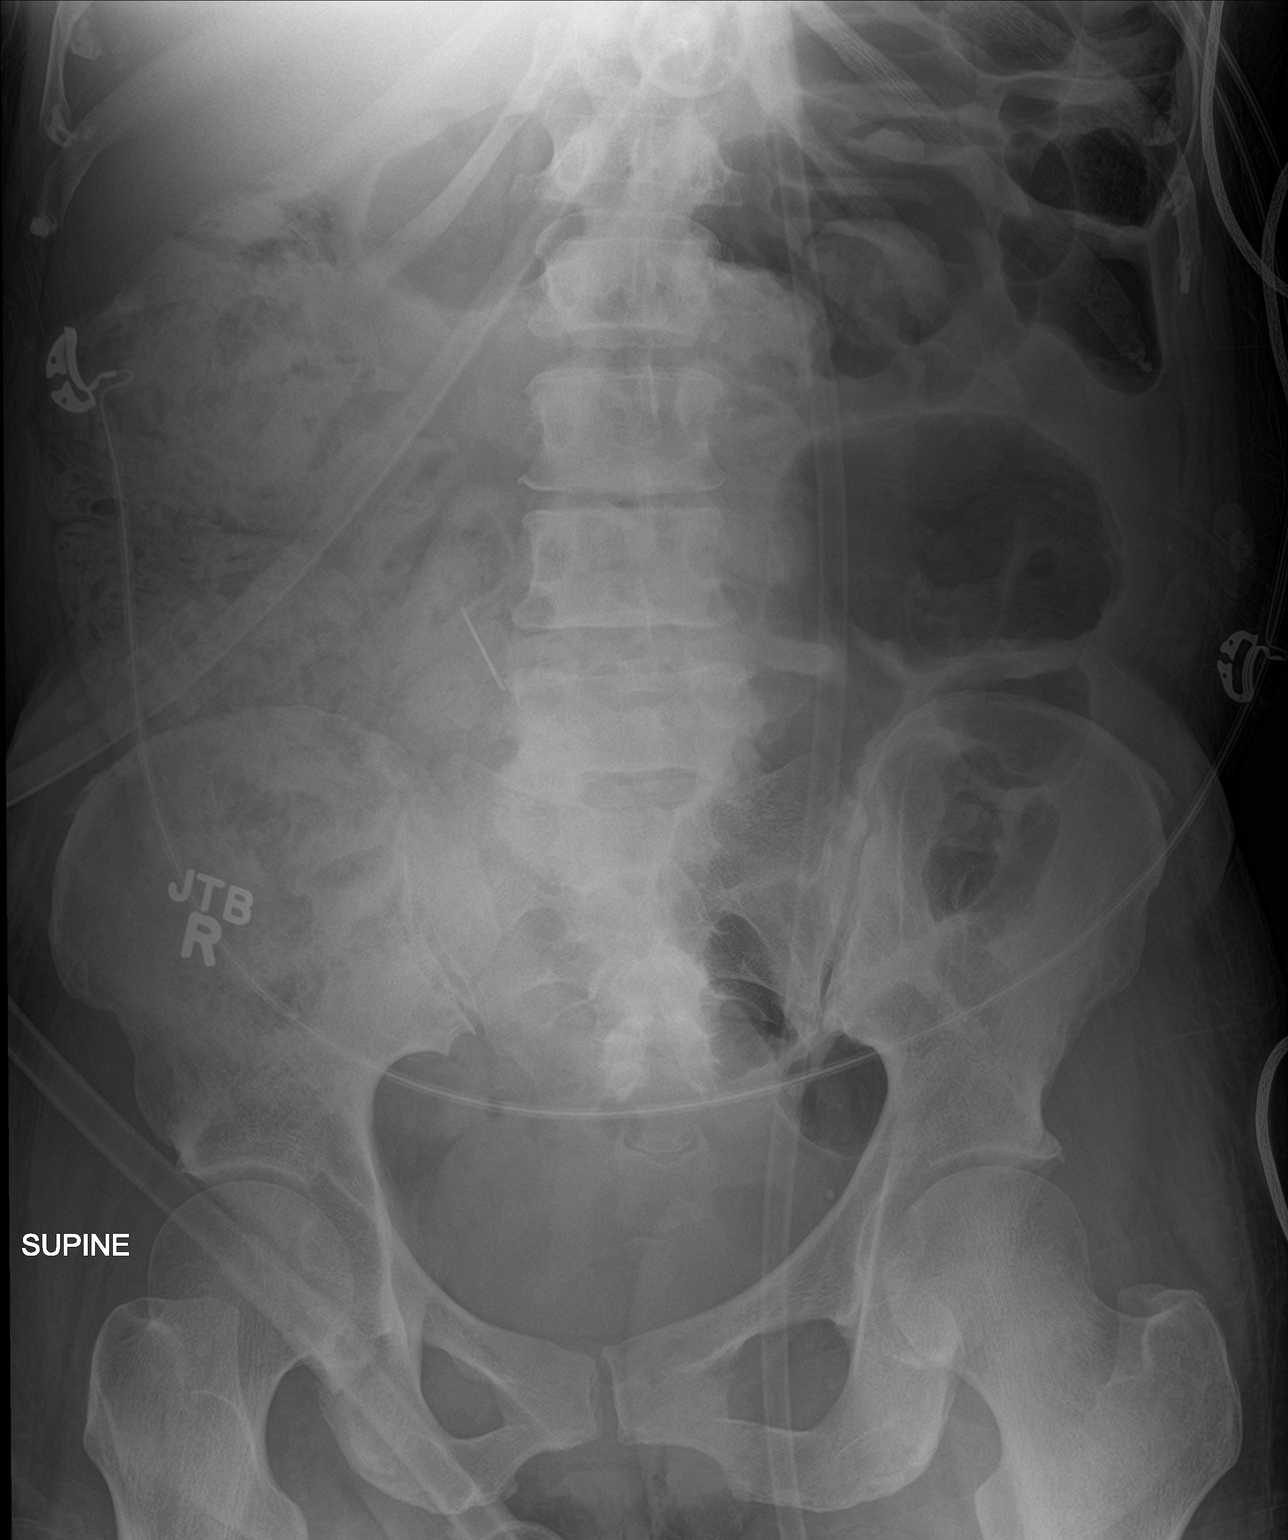

[1 of 1 positions shown; findings below may reference images not displayed]

FINDINGS: Gastrostomy projects over the left upper abdomen. Nonobstructive
bowel gas pattern. Moderate stool in the right colon. Gas within the
sigmoid colon with decreased distention since prior study. No
organomegaly or free air.
IMPRESSION: Continued large stool burden in the right colon.

No evidence of bowel obstruction.

## 2020-04-11 MED ORDER — DOCUSATE SODIUM 283 MG RE ENEM
1.0000 | ENEMA | Freq: Once | RECTAL | Status: AC
  Administered 2020-04-11: 283 mg via RECTAL
  Filled 2020-04-11: qty 1

## 2020-04-11 NOTE — Progress Notes (Signed)
[facility]  PROGRESS NOTE  Vickie Alvarado AJO:[id number] DOB: 18-Feb-1966 DOA: 03/20/2020 PCP: [facility] Admit date - 03/20/2020   Admitting Physician Jacky Kindle, MD  Outpatient Primary MD for the patient is [place], [facility]  LOS - 74 Brief Narrative   Vickie Alvarado is a 54 y.o. 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  She underwent stereotactic biopsy and aspiration of the abscess by neurosurgery on 03/22/2020 and was transferred to ICU.    Hospital course complicated by worsening mental status requiring MRI which showed new large left PCA infarct along with scattered infarcts in the right basal ganglia and right thalamus concerning for either CVA related to septic emboli versus herniation syndrome.  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 Given her large hemisphere brain abscess with surrounding edema/cerebritis neurosurgery did not recommend decompressive surger  Underwent  PEG placement on 9/9 with tube feeds starting at that time.  CT head shows improvement in fluid collections shows persistent left parietal/occipital acute/subacute infarcts with improvement in midline shift.  She has completed Decadron taper    Subjective  Today patient had large BM overnight.  Abdomen looks much less distended in comparison to yesterday.  Daughter is at bedside.  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  Status post serial tactic biopsy/aspiration by neurosurgery on 03/22/2020.  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  Neurologic exam is stable (opens eyes to voice).  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  Repeat abdominal x-ray shows improvement in amount of stool burden though still quite present with no concerning signs for obstruction.  Patient responded well to enema overnight -We will continue bowel regimen with repeat enema -Repeat abdominal x-ray on 9/17 to monitor serially -Optimize bowel regimen disease(currently on docusate 50 mg twice daily)-- add enema and monitor output   Acute hypoxic respiratory failure, status post trach 04/01/2020, stable 5 L of O2 on trach collar - will need to be cuffless prior to discharge  HTN, stable -Continue Amlodipine 5 mg  Nutrition -Continue Prosource via feeding tube  GERD, stable-continue PPI per tube  Type 2 diabetes -Lantus 20 units daily, NovoLog 3 units every 4 hours while on tube feeds -Continue IV Rocephin, end  date 05/17/2020 for a total of 8 weeks as recommended by ID  Anemia of chronic disease.  Folate levels negative, adequate iron/ferritin -Continue folate supplementation, monitor hemoglobin  History of bipolar disorder, stable -Not on home Seroquel here  Pressure ulcer in gluteal cleft/sacral area,  consistent with stage 2 on my exam (no full loss of dermis, no fat exposure) difficult to promote healing given patient is incontinent of stool -Wound consulted, follow recommendations for cleaning, and foam dressing to sacrum every day    Family Communication  : spoke with daughter at bedside  Code Status : Full  Disposition Plan  :  Patient is from home. Anticipated d/c date: >3 days. Barriers to d/c or necessity for inpatient status:  Currently optimizing bowel regimen as patient has significant stool burden with concern for early signs of obstruction in the colonic region, en, currently requiring IV ceftriaxone for abscess and repeat MRI in 4 days per NSG Consults  : Neurosurgery, ID, PCCM, IR  Procedures  :    DVT Prophylaxis  : Heparin  Lab Results  Component Value Date   PLT 156 04/07/2020    Diet :  Diet Order            Diet NPO time specified  Diet effective midnight                  Inpatient Medications Scheduled Meds: . amLODipine  5 mg Per Tube Daily  . chlorhexidine gluconate (MEDLINE KIT)  15 mL Mouth Rinse BID  . Chlorhexidine Gluconate Cloth  6 each Topical Daily  . docusate  100 mg Per Tube BID  . docusate sodium  1 enema Rectal Once  . feeding supplement (PROSource TF)  45 mL Per Tube TID  . folic acid  1 mg Per Tube Daily  . heparin injection (subcutaneous)  5,000 Units Subcutaneous Q8H  . insulin aspart  0-20 Units Subcutaneous Q4H  . insulin aspart  3 Units Subcutaneous Q4H  . insulin glargine  20 Units Subcutaneous Daily  . levETIRAcetam  500 mg Per Tube BID  . mouth rinse  15 mL Mouth Rinse 10 times per day  . pantoprazole sodium  40 mg Per  Tube QHS  . polyethylene glycol  17 g Per Tube Daily  . sodium chloride flush  10-40 mL Intracatheter Q12H   Continuous Infusions: . cefTRIAXone (ROCEPHIN)  IV Stopped (04/11/20 0954)  . dextrose 5% lactated ringers 30 mL/hr at 04/10/20 0600  . feeding supplement (JEVITY 1.5 CAL/FIBER) 1,000 mL (04/11/20 0340)   PRN Meds:.acetaminophen (TYLENOL) oral liquid 160 mg/5 mL, acetaminophen **OR** acetaminophen, fentaNYL (SUBLIMAZE) injection, hydrALAZINE, HYDROcodone-acetaminophen, labetalol, ondansetron (ZOFRAN) IV, polyethylene glycol, promethazine, sodium chloride flush  Antibiotics  :   Anti-infectives (From admission, onward)   Start     Dose/Rate Route Frequency Ordered Stop   04/04/20 1000  ceFAZolin (ANCEF) IVPB 2g/100 mL premix        2 g 200 mL/hr over 30 Minutes Intravenous To Radiology 04/04/20 0935 04/04/20 1053   04/03/20 0600  ceFAZolin (ANCEF) IVPB 1 g/50 mL premix        1 g 100 mL/hr over 30 Minutes Intravenous To Radiology 04/01/20 1239 04/04/20 0600   03/24/20 1030  vancomycin (VANCOREADY) IVPB 1500 mg/300 mL  Status:  Discontinued        1,500 mg 150 mL/hr over 120 Minutes Intravenous Every 8 hours 03/24/20 0952 03/24/20 1336   03/23/20 0130  vancomycin (VANCOCIN) IVPB 1000 mg/200 mL premix  Status:  Discontinued        1,000 mg 200 mL/hr over 60 Minutes Intravenous Every 8 hours 03/22/20 1647 03/24/20 0952   03/22/20 2200  cefTRIAXone (ROCEPHIN) 2 g in sodium chloride 0.9 % 100 mL IVPB        2 g 200 mL/hr over 30 Minutes Intravenous Every 12 hours 03/22/20 1617     03/22/20 1730  vancomycin (VANCOREADY) IVPB 1750 mg/350 mL        1,750 mg 175 mL/hr over 120 Minutes Intravenous  Once 03/22/20 1647 03/22/20 2030   03/22/20 1630  metroNIDAZOLE (FLAGYL) IVPB 500 mg  Status:  Discontinued        500 mg 100 mL/hr over 60 Minutes Intravenous Every 8 hours 03/22/20 1618 03/24/20 1350   03/22/20 1336  bacitracin 50,000 Units in sodium chloride 0.9 % 500 mL irrigation   Status:  Discontinued          As needed 03/22/20 1336 03/22/20 1341   03/22/20 1315  vancomycin (VANCOCIN) IVPB 1000 mg/200 mL premix  Status:  Discontinued        1,000 mg 200 mL/hr over 60 Minutes Intravenous To Surgery 03/22/20 1313 03/22/20 1545   03/22/20 1315  ceFEPIme (MAXIPIME) 2 g in sodium chloride 0.9 % 100 mL IVPB        2 g 200 mL/hr over 30 Minutes Intravenous To Surgery 03/22/20 1313 03/22/20 1354   03/22/20 1145  ceFAZolin (ANCEF) IVPB 2g/100 mL premix  Status:  Discontinued        2 g 200 mL/hr over 30 Minutes Intravenous  Once 03/22/20 1131 03/22/20 1545   03/22/20 1138  ceFAZolin (ANCEF) 2-4 GM/100ML-% IVPB       Note to Pharmacy: Gleason, Ginger   : cabinet override      03/22/20 1138 03/22/20 2344       Objective   Vitals:   04/11/20 0337 04/11/20 0720 04/11/20 0857 04/11/20 1145  BP: 114/72 111/66 112/69 103/70  Pulse: 82 76 69 79  Resp: 18 16 18 16   Temp: 98.4 F (36.9 C) 98.4 F (36.9 C)  (!) 97.5 F (36.4 C)  TempSrc: Axillary Axillary  Axillary  SpO2: 96% 100% 100% 100%  Weight:      Height:        SpO2: 100 % O2 Flow Rate (L/min): 5 L/min FiO2 (%): 28 %  Wt Readings from Last 3 Encounters:  04/11/20 69 kg  03/01/20 88 kg     Intake/Output Summary (Last 24 hours) at 04/11/2020 1425 Last data filed at 04/11/2020 1400 Gross per 24 hour  Intake 879.2 ml  Output 450 ml  Net 429.2 ml    Physical Exam:  Opens eyes follows voice today, not following commands, stable neurologic exam Lying comfortably, in no distress Normal respiratory effort, normal breath sounds Abdomen much softer today with no obvious distention, normal bowel sounds. Feeding tube in place      I have personally reviewed the following:   Data Reviewed:  CBC Recent Labs  Lab 04/04/20 1446 04/07/20 0435  WBC 10.7* 5.9  HGB 11.6* 10.0*  HCT 35.8* 30.4*  PLT 199 156  MCV 91.3 92.1  MCH 29.6 30.3  MCHC 32.4 32.9  RDW 17.0* 17.2*  LYMPHSABS 1.1  --    MONOABS 0.7  --   EOSABS 0.0  --   BASOSABS 0.0  --     Chemistries  Recent Labs  Lab 04/04/20 1530 04/07/20 0435 04/07/20 1444 04/08/20 0500 04/08/20  1235  NA 134* 135  --   --   --   K 3.8 3.3*  --  4.2  --   CL 103 104  --   --   --   CO2 22 23  --   --   --   GLUCOSE 208* 168*  --   --   --   BUN 15 8  --   --   --   CREATININE 0.34* 0.31*  --   --   --   CALCIUM 8.2* 7.0*  --   --   --   MG  --   --  1.5*  --  1.8  AST 16  --   --   --   --   ALT 48*  --   --   --   --   ALKPHOS 67  --   --   --   --   BILITOT 0.3  --   --   --   --    ------------------------------------------------------------------------------------------------------------------ No results for input(s): CHOL, HDL, LDLCALC, TRIG, CHOLHDL, LDLDIRECT in the last 72 hours.  Lab Results  Component Value Date   HGBA1C 11.5 (H) 03/22/2020   ------------------------------------------------------------------------------------------------------------------ No results for input(s): TSH, T4TOTAL, T3FREE, THYROIDAB in the last 72 hours.  Invalid input(s): FREET3 ------------------------------------------------------------------------------------------------------------------ No results for input(s): VITAMINB12, FOLATE, FERRITIN, TIBC, IRON, RETICCTPCT in the last 72 hours.  Coagulation profile No results for input(s): INR, PROTIME in the last 168 hours.  No results for input(s): DDIMER in the last 72 hours.  Cardiac Enzymes No results for input(s): CKMB, TROPONINI, MYOGLOBIN in the last 168 hours.  Invalid input(s): CK ------------------------------------------------------------------------------------------------------------------ No results found for: BNP  Micro Results No results found for this or any previous visit (from the past 240 hour(s)).  Radiology Reports CT Angio Head W/Cm &/Or Wo Cm  Result Date: 03/20/2020 CLINICAL DATA:  Acute neuro deficit with right-sided weakness. EXAM: CT  ANGIOGRAPHY HEAD AND NECK TECHNIQUE: Multidetector CT imaging of the head and neck was performed using the standard protocol during bolus administration of intravenous contrast. Multiplanar CT image reconstructions and MIPs were obtained to evaluate the vascular anatomy. Carotid stenosis measurements (when applicable) are obtained utilizing NASCET criteria, using the distal internal carotid diameter as the denominator. CONTRAST:  9m OMNIPAQUE IOHEXOL 350 MG/ML SOLN COMPARISON:  None. FINDINGS: CT HEAD FINDINGS Brain: Large mass lesion in the left frontal lobe. Delayed imaging reveals irregular enhancement of the periphery with central necrosis. The enhancing component of the mass measures approximately 53 x 28 mm. Surrounding low-density edema/tumor seen around the enhancement. Low-density extends into the left basal ganglia. There is compression of the left lateral ventricle and 7 mm midline shift to the right. No acute hemorrhage. Negative for hydrocephalus Vascular: Negative for hyperdense vessel Skull: Negative Sinuses: Complete opacification right maxillary sinus with bony thickening. Mucosal edema extends into the right frontal and right ethmoid sinus. Remaining sinuses clear. Mastoid clear bilaterally. Orbits: Negative Review of the MIP images confirms the above findings CTA NECK FINDINGS Aortic arch: Normal aortic arch. Azygos branching pattern. Proximal great vessels widely patent. Right carotid system: Right carotid widely patent. Mild noncalcified plaque in the right carotid bulb. Left carotid system: Left carotid widely patent. No significant stenosis or atherosclerotic disease. Vertebral arteries: Both vertebral arteries are widely patent to the basilar without stenosis. Skeleton: Disc degeneration and spurring C5-6 and C6-7. No acute skeletal abnormality. Poor dentition. Other neck: Negative for mass or adenopathy. Upper chest:  Lung apices clear bilaterally Review of the MIP images confirms the above  findings CTA HEAD FINDINGS Anterior circulation: Mild atherosclerotic calcification in the cavernous carotid without stenosis. Right middle cerebral artery widely patent. Hypoplastic right A1 segment. Azygos anterior cerebral artery arising from the left without significant stenosis. There is displacement and stretching of the left MCA branches due to the large left frontal mass. Posterior circulation: Both vertebral arteries patent to the basilar. PICA patent bilaterally. Basilar widely patent. AICA, superior cerebellar, and posterior cerebral arteries patent bilaterally without stenosis. Venous sinuses: Normal venous enhancement Anatomic variants: None Review of the MIP images confirms the above findings IMPRESSION: 1. Large mass lesion left frontal lobe compatible with tumor. Favor glioblastoma. 2. Negative for acute infarct or hemorrhage 3. No significant carotid or vertebral artery stenosis in the neck. 4. Negative for intracranial large vessel occlusion. Electronically Signed   By: Franchot Gallo M.D.   On: 03/20/2020 13:29   DG Abd 1 View  Result Date: 04/11/2020 CLINICAL DATA:  Abdominal distension. EXAM: ABDOMEN - 1 VIEW COMPARISON:  04/10/2020 FINDINGS: Gastrostomy projects over the left upper abdomen. Nonobstructive bowel gas pattern. Moderate stool in the right colon. Gas within the sigmoid colon with decreased distention since prior study. No organomegaly or free air. IMPRESSION: Continued large stool burden in the right colon. No evidence of bowel obstruction. Electronically Signed   By: Rolm Baptise M.D.   On: 04/11/2020 09:26   DG Abd 1 View  Result Date: 04/10/2020 CLINICAL DATA:  Abdominal distension EXAM: ABDOMEN - 1 VIEW COMPARISON:  CT 12/19/2019 FINDINGS: Large proximal colonic stool burden with air distention of the distal colonic segments and some mild thickening of the haustra distally. An air distended loop seen in the left lower quadrant is present with lack of air and stool over  the rectal vault. Percutaneous feeding tube projects over left upper abdomen. Atelectatic changes in the lung bases. No suspicious calcifications. Mild degenerative changes in the spine and pelvis. IMPRESSION: 1. Large proximal colonic stool burden with air distention of the distal colonic segments and some suspect thickening of the haustra distally. Air distended loop seen in the left lower quadrant may reflect a distended sigmoid with lack of air and stool over the rectal vault. Features could reflect constipation, though early distal colonic obstruction/sigmoid volvulus could present with a similar appearance. 2. Percutaneous gastrostomy in the left upper quadrant. Electronically Signed   By: Lovena Le M.D.   On: 04/10/2020 15:57   CT HEAD WO CONTRAST  Result Date: 04/08/2020 CLINICAL DATA:  Stroke. Cerebritis. Cerebral abscess. Left-sided ptosis. EXAM: CT HEAD WITHOUT CONTRAST TECHNIQUE: Contiguous axial images were obtained from the base of the skull through the vertex without intravenous contrast. COMPARISON:  MR head without and with contrast 03/24/2020. CT head without contrast 03/26/2020. FINDINGS: Brain: Previously noted fluid collections in the left operculum have significantly improved. No fluid levels or gas collections are present. Attenuation of the left lentiform nucleus is restored. Persistent hypoattenuation is present throughout the left parietal and occipital white matter. Left occipital cortical infarcts are noted. Midline shift of 2-3 mm is improved. Vascular: Atherosclerotic calcifications are present within the cavernous internal carotid arteries bilaterally. No hyperdense vessel is present. Skull: Left frontal burr hole is noted. Calvarium is otherwise intact. No significant extracranial soft tissue lesion is present. Sinuses/Orbits: Chronic right maxillary sinus specification is present. Bilateral mastoid effusions are present, left greater than right. Middle ear cavity is clear. No  obstruction is present. Sinuses are  otherwise clear. The globes and orbits are within normal limits. IMPRESSION: 1. Significantly improved fluid collections in the left operculum. No fluid levels or gas collections are present. 2. Persistent hypoattenuation throughout the left parietal and occipital white matter compatible with acute/subacute infarcts. 3. Left occipital cortical infarcts. 4. Improved midline shift of 2-3 mm. 5. Bilateral mastoid effusions, left greater than right. No obstruction is present. 6. Chronic right maxillary sinus disease. Electronically Signed   By: San Morelle M.D.   On: 04/08/2020 14:48   CT HEAD WO CONTRAST  Result Date: 03/26/2020 CLINICAL DATA:  Cerebral edema.  Status post biopsy. EXAM: CT HEAD WITHOUT CONTRAST TECHNIQUE: Contiguous axial images were obtained from the base of the skull through the vertex without intravenous contrast. COMPARISON:  None. FINDINGS: Brain: Decreased pneumocephalus at the left frontal biopsy site. The degree of edema throughout the left hemisphere has improved slightly. But, there is a new left posterior cerebral artery territory infarct. Rightward midline shift measures 6 mm at the level of the foramina of [place]. There is improved patency of the basal cisterns. No hydrocephalus. Vascular: No hyperdense vessel or unexpected calcification. Skull: Normal. Negative for fracture or focal lesion. Sinuses/Orbits: Complete opacification of the right frontal and maxillary sinuses. Normal orbits. Other: None IMPRESSION: 1. Slightly improved edema throughout the left hemisphere with 6 mm rightward midline shift. Improved patency of the basal cisterns. 2. Left posterior cerebral artery territory infarct. Electronically Signed   By: Ulyses Jarred M.D.   On: 03/26/2020 01:51   CT HEAD WO CONTRAST  Result Date: 03/22/2020 CLINICAL DATA:  Mental status change. Recent diagnosis of brain lesion post biopsy. EXAM: CT HEAD WITHOUT CONTRAST TECHNIQUE:  Contiguous axial images were obtained from the base of the skull through the vertex without intravenous contrast. COMPARISON:  CT 8 hours ago.  Brain MRI earlier today. FINDINGS: Brain: Stable size and appearance of the left brain lesion centered in the basal ganglia. Stable internal air or Gel-Foam. Stable regional mass effect. Left-to-right midline shift of 9 mm, previously 11 mm. No evidence of lesional or acute hemorrhage. Stable sulcal effacement. Lacunar infarct in the right thalamus better appreciated on prior MRI. Stable ventricular size. Basilar cisterns are patent. No developing subdural collection. Vascular: No hyperdense vessel. Skull: Left frontal burr hole.  No fracture or focal lesion. Sinuses/Orbits: Unchanged appearance of right maxillary and frontal sinusitis with opacification of anterior ethmoid air cells. Other: Post biopsy changes in the left frontal scalp. IMPRESSION: 1. Stable size and appearance of the left brain lesion centered in the basal ganglia with internal air or Gel-Foam. Stable regional mass effect. Slightly diminished left-to-right midline shift of 9 mm, previously 11 mm. 2. No evidence of hemorrhage or new abnormality. Electronically Signed   By: Keith Rake M.D.   On: 03/22/2020 23:33   CT HEAD WO CONTRAST  Result Date: 03/22/2020 CLINICAL DATA:  Follow-up brain biopsy. EXAM: CT HEAD WITHOUT CONTRAST TECHNIQUE: Contiguous axial images were obtained from the base of the skull through the vertex without intravenous contrast. COMPARISON:  MRI earlier same day.  CT yesterday. FINDINGS: Brain: Left frontal burr hole. Biopsy site with air or Gel-Foam in the region of the previous tumor with the epicenter in the left basal ganglia. No evidence of postsurgical hemorrhage. No change in regional mass effect with left-to-right shift 11 mm. No extra-axial collection. Vascular: No abnormal vascular finding. Skull: Otherwise negative Sinuses/Orbits: Ostiomeatal unit pattern of the  paranasal sinuses on the right. Other: None IMPRESSION: Biopsy site  with air or Gel-Foam in the region of the previous tumor with the epicenter in the left basal ganglia. No evidence of postsurgical hemorrhage. No change in regional mass effect with left-to-right shift 11 mm. Electronically Signed   By: Nelson Chimes M.D.   On: 03/22/2020 15:58   CT HEAD WO CONTRAST  Result Date: 03/21/2020 CLINICAL DATA:  Brain mass. EXAM: CT HEAD WITHOUT CONTRAST TECHNIQUE: Contiguous axial images were obtained from the base of the skull through the vertex without intravenous contrast. COMPARISON:  Brain MRI from yesterday FINDINGS: Brain: Known mass with central low-density and extensive hemispheric low-density and swelling centered in the deep left cerebrum. Dimensions were better assessed on postcontrast imaging from yesterday. No interval hemorrhage or herniation. Midline shift measures 1 cm without entrapment. Vascular: Negative Skull: Negative Sinuses/Orbits: Chronic right-sided sinusitis with obstruction at the level of the middle meatus. Other: Motion artifact IMPRESSION: 1. BrainLAB protocol but motion artifact at the vertex and skull base due to confusion. If not adequate for intraoperative localization a sedated scan may be required. 2. No new findings when compared to preceding brain MRI. Electronically Signed   By: Monte Fantasia M.D.   On: 03/21/2020 11:32   CT Angio Neck W and/or Wo Contrast  Result Date: 03/20/2020 CLINICAL DATA:  Acute neuro deficit with right-sided weakness. EXAM: CT ANGIOGRAPHY HEAD AND NECK TECHNIQUE: Multidetector CT imaging of the head and neck was performed using the standard protocol during bolus administration of intravenous contrast. Multiplanar CT image reconstructions and MIPs were obtained to evaluate the vascular anatomy. Carotid stenosis measurements (when applicable) are obtained utilizing NASCET criteria, using the distal internal carotid diameter as the denominator.  CONTRAST:  22m OMNIPAQUE IOHEXOL 350 MG/ML SOLN COMPARISON:  None. FINDINGS: CT HEAD FINDINGS Brain: Large mass lesion in the left frontal lobe. Delayed imaging reveals irregular enhancement of the periphery with central necrosis. The enhancing component of the mass measures approximately 53 x 28 mm. Surrounding low-density edema/tumor seen around the enhancement. Low-density extends into the left basal ganglia. There is compression of the left lateral ventricle and 7 mm midline shift to the right. No acute hemorrhage. Negative for hydrocephalus Vascular: Negative for hyperdense vessel Skull: Negative Sinuses: Complete opacification right maxillary sinus with bony thickening. Mucosal edema extends into the right frontal and right ethmoid sinus. Remaining sinuses clear. Mastoid clear bilaterally. Orbits: Negative Review of the MIP images confirms the above findings CTA NECK FINDINGS Aortic arch: Normal aortic arch. Azygos branching pattern. Proximal great vessels widely patent. Right carotid system: Right carotid widely patent. Mild noncalcified plaque in the right carotid bulb. Left carotid system: Left carotid widely patent. No significant stenosis or atherosclerotic disease. Vertebral arteries: Both vertebral arteries are widely patent to the basilar without stenosis. Skeleton: Disc degeneration and spurring C5-6 and C6-7. No acute skeletal abnormality. Poor dentition. Other neck: Negative for mass or adenopathy. Upper chest: Lung apices clear bilaterally Review of the MIP images confirms the above findings CTA HEAD FINDINGS Anterior circulation: Mild atherosclerotic calcification in the cavernous carotid without stenosis. Right middle cerebral artery widely patent. Hypoplastic right A1 segment. Azygos anterior cerebral artery arising from the left without significant stenosis. There is displacement and stretching of the left MCA branches due to the large left frontal mass. Posterior circulation: Both vertebral  arteries patent to the basilar. PICA patent bilaterally. Basilar widely patent. AICA, superior cerebellar, and posterior cerebral arteries patent bilaterally without stenosis. Venous sinuses: Normal venous enhancement Anatomic variants: None Review of the MIP images confirms  the above findings IMPRESSION: 1. Large mass lesion left frontal lobe compatible with tumor. Favor glioblastoma. 2. Negative for acute infarct or hemorrhage 3. No significant carotid or vertebral artery stenosis in the neck. 4. Negative for intracranial large vessel occlusion. Electronically Signed   By: Franchot Gallo M.D.   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 EXAM: MRI HEAD WITHOUT AND WITH CONTRAST MRA HEAD WITHOUT CONTRAST TECHNIQUE: Multiplanar, multiecho pulse sequences of the brain and surrounding structures were obtained without and with intravenous contrast. Angiographic images of the head were obtained using MRA technique without contrast. CONTRAST:  69m GADAVIST GADOBUTROL 1 MMOL/ML IV SOLN COMPARISON:  Prior MRI from 03/22/2020 and 03/20/2020. FINDINGS: MRI HEAD FINDINGS Brain: Postoperative changes from interval stereotactic aspiration of previously identified lesion centered at the left basal ganglia is seen. Aspiration tract with a small amount of susceptibility artifacts seen extending via a left frontal approach. The lesion at the left basal ganglia is slightly decreased in size now measuring 5.1 x 2.4 x 2.7 cm, previously 6.0 x 2.4 x 3.5 cm. Associated internal fluid-fluid level with restricted diffusion. Small focus of pneumocephalus at the nondependent portion of this lesion. Per history, this lesion was found to be most consistent with an intracerebral infection/abscess. Persistent surrounding vasogenic edema throughout the adjacent left cerebral hemisphere with regional  mass effect and effacement of the left lateral ventricle. Edema extends into the left cerebral peduncle and left midbrain, similar to previous. Associated left-to-right shift has worsened now measuring up to 10 mm, previously 6 mm. No overt hydrocephalus or ventricular trapping at this time. Crowding of the basilar cisterns with mass effect on the adjacent midbrain, similar to slightly worsened. No transtentorial herniation. 1 cm right thalamic infarct is similar to previous. No associated hemorrhage. Interval development of confluent restricted diffusion throughout the left temporal occipital region, left PCA distribution, consistent with acute ischemia. No associated hemorrhage. Multiple additional new patchy ischemic infarcts involving the bilateral thalami, splenium, parasagittal right temporal occipital region, and left dorsal pons. Right thalamic ischemic changes extend into the right midbrain. These infarcts involve the posterior circulation. An additional curvilinear infarct extending from the right lentiform nucleus towards the right caudate noted as well, favored to be anterior distribution (series 6, image 76). Patchy restricted diffusion also seen in the region of the left hypothalamus (series 6, image 69). Additional small cortical infarct noted at the posterior right frontotemporal region (series 6, image 80). Associated mild petechial hemorrhage at the left dorsal pons without hemorrhagic transformation. Findings favored to be embolic in nature. Mild left a meningeal enhancement noted involving the parasagittal left occipital region (series 29, image 7). While this finding could be reactive in nature related to adjacent left PCA territory infarct, a degree of underlying left a meningeal meningitis related to infection could also be considered. Pituitary stalk is thickened and enhancing as well. Vascular: Major intracranial vascular flow voids are maintained. Normal opacification seen throughout the  major dural sinuses following contrast administration. Skull and upper cervical spine: Craniocervical junction within normal limits. No transtentorial herniation. Bone marrow signal intensity normal. No scalp soft tissue abnormality. Sinuses/Orbits: Globes and orbital soft tissues within normal limits. Extensive paranasal sinus disease involving the right frontoethmoidal and right maxillary sinuses noted. Fluid seen within the nasopharynx. Small bilateral mastoid effusions. Patient is intubated. Other: None. MRA HEAD FINDINGS ANTERIOR CIRCULATION: Examination degraded by motion  artifact. Visualized distal cervical segments of the internal carotid arteries are patent with symmetric antegrade flow. Petrous, cavernous, and supraclinoid ICAs patent without stenosis or other abnormality. Left A1 patent. Right A1 hypoplastic and/or absent, accounting for the diminutive right ICA is compared to the left. Widely patent azygos ACA. No M1 stenosis or occlusion. Normal MCA bifurcations. Distal MCA branches well perfused and symmetric. POSTERIOR CIRCULATION: Vertebral arteries patent to the vertebrobasilar junction without stenosis. Right PICA patent. Left PICA not seen. Basilar mildly irregular but patent to its distal aspect without high-grade stenosis. Superior cerebral arteries patent bilaterally. Both PCAs primarily supplied via the basilar. Irregularity about the PCAs bilaterally with associated mild to moderate stenoses, greater on the left. Specific note made of a moderate left P2 stenosis (series 28, image 14) PCAs remain well perfused to their distal aspects. No aneurysm. IMPRESSION: MRI HEAD IMPRESSION: 1. Postoperative changes from stereotactic aspiration/drainage of left basal ganglia lesion without complication. Lesion is decreased in size now measuring 5.1 x 2.4 x 2.7 cm. Per history, this is consistent with and intracerebral abscess. 2. Persistent associated vasogenic edema throughout the left cerebral  hemisphere with mildly worsened 10 mm left-to-right shift. 3. Interval development of multifocal predominantly posterior circulation infarcts, including a large left PCA territory infarct. Minimal petechial hemorrhage at the left pons without hemorrhagic transformation. A central thromboembolic source is suspected. 4. Patchy leptomeningeal enhancement within the posterior left occipital region. While this may be reactive to the adjacent left PCA territory infarct, a degree of leptomeningeal meningitis related to intracerebral infection could also be considered. 5. Extensive right-sided paranasal sinus disease. MRA HEAD IMPRESSION: 1. Negative intracranial MRA for large vessel occlusion. 2. Scattered vascular irregularity involving the basilar and bilateral PCAs without occlusion. No proximal high-grade or correctable stenosis. No aneurysm. Findings discussed by called by telephone on 03/24/2020 at approximately 5:30 am with provider Dr. Rory Percy. Electronically Signed   By: Jeannine Boga M.D.   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 EXAM: MRI HEAD WITHOUT AND WITH CONTRAST MRA HEAD WITHOUT CONTRAST TECHNIQUE: Multiplanar, multiecho pulse sequences of the brain and surrounding structures were obtained without and with intravenous contrast. Angiographic images of the head were obtained using MRA technique without contrast. CONTRAST:  64m GADAVIST GADOBUTROL 1 MMOL/ML IV SOLN COMPARISON:  Prior MRI from 03/22/2020 and 03/20/2020. FINDINGS: MRI HEAD FINDINGS Brain: Postoperative changes from interval stereotactic aspiration of previously identified lesion centered at the left basal ganglia is seen. Aspiration tract with a small amount of susceptibility artifacts seen extending via a left frontal approach. The lesion at the left basal ganglia is slightly decreased in size  now measuring 5.1 x 2.4 x 2.7 cm, previously 6.0 x 2.4 x 3.5 cm. Associated internal fluid-fluid level with restricted diffusion. Small focus of pneumocephalus at the nondependent portion of this lesion. Per history, this lesion was found to be most consistent with an intracerebral infection/abscess. Persistent surrounding vasogenic edema throughout the adjacent left cerebral hemisphere with regional mass effect and effacement of the left lateral ventricle. Edema extends into the left cerebral peduncle and left midbrain, similar to previous. Associated left-to-right shift has worsened now measuring up to 10 mm, previously 6 mm. No overt hydrocephalus or ventricular trapping at this time. Crowding of the basilar cisterns with mass effect on the adjacent midbrain, similar to slightly worsened. No transtentorial herniation. 1 cm right thalamic infarct is  similar to previous. No associated hemorrhage. Interval development of confluent restricted diffusion throughout the left temporal occipital region, left PCA distribution, consistent with acute ischemia. No associated hemorrhage. Multiple additional new patchy ischemic infarcts involving the bilateral thalami, splenium, parasagittal right temporal occipital region, and left dorsal pons. Right thalamic ischemic changes extend into the right midbrain. These infarcts involve the posterior circulation. An additional curvilinear infarct extending from the right lentiform nucleus towards the right caudate noted as well, favored to be anterior distribution (series 6, image 76). Patchy restricted diffusion also seen in the region of the left hypothalamus (series 6, image 69). Additional small cortical infarct noted at the posterior right frontotemporal region (series 6, image 80). Associated mild petechial hemorrhage at the left dorsal pons without hemorrhagic transformation. Findings favored to be embolic in nature. Mild left a meningeal enhancement noted involving the  parasagittal left occipital region (series 29, image 7). While this finding could be reactive in nature related to adjacent left PCA territory infarct, a degree of underlying left a meningeal meningitis related to infection could also be considered. Pituitary stalk is thickened and enhancing as well. Vascular: Major intracranial vascular flow voids are maintained. Normal opacification seen throughout the major dural sinuses following contrast administration. Skull and upper cervical spine: Craniocervical junction within normal limits. No transtentorial herniation. Bone marrow signal intensity normal. No scalp soft tissue abnormality. Sinuses/Orbits: Globes and orbital soft tissues within normal limits. Extensive paranasal sinus disease involving the right frontoethmoidal and right maxillary sinuses noted. Fluid seen within the nasopharynx. Small bilateral mastoid effusions. Patient is intubated. Other: None. MRA HEAD FINDINGS ANTERIOR CIRCULATION: Examination degraded by motion artifact. Visualized distal cervical segments of the internal carotid arteries are patent with symmetric antegrade flow. Petrous, cavernous, and supraclinoid ICAs patent without stenosis or other abnormality. Left A1 patent. Right A1 hypoplastic and/or absent, accounting for the diminutive right ICA is compared to the left. Widely patent azygos ACA. No M1 stenosis or occlusion. Normal MCA bifurcations. Distal MCA branches well perfused and symmetric. POSTERIOR CIRCULATION: Vertebral arteries patent to the vertebrobasilar junction without stenosis. Right PICA patent. Left PICA not seen. Basilar mildly irregular but patent to its distal aspect without high-grade stenosis. Superior cerebral arteries patent bilaterally. Both PCAs primarily supplied via the basilar. Irregularity about the PCAs bilaterally with associated mild to moderate stenoses, greater on the left. Specific note made of a moderate left P2 stenosis (series 28, image 14) PCAs  remain well perfused to their distal aspects. No aneurysm. IMPRESSION: MRI HEAD IMPRESSION: 1. Postoperative changes from stereotactic aspiration/drainage of left basal ganglia lesion without complication. Lesion is decreased in size now measuring 5.1 x 2.4 x 2.7 cm. Per history, this is consistent with and intracerebral abscess. 2. Persistent associated vasogenic edema throughout the left cerebral hemisphere with mildly worsened 10 mm left-to-right shift. 3. Interval development of multifocal predominantly posterior circulation infarcts, including a large left PCA territory infarct. Minimal petechial hemorrhage at the left pons without hemorrhagic transformation. A central thromboembolic source is suspected. 4. Patchy leptomeningeal enhancement within the posterior left occipital region. While this may be reactive to the adjacent left PCA territory infarct, a degree of leptomeningeal meningitis related to intracerebral infection could also be considered. 5. Extensive right-sided paranasal sinus disease. MRA HEAD IMPRESSION: 1. Negative intracranial MRA for large vessel occlusion. 2. Scattered vascular irregularity involving the basilar and bilateral PCAs without occlusion. No proximal high-grade or correctable stenosis. No aneurysm. Findings discussed by called by telephone on 03/24/2020 at approximately 5:30 am with  provider Dr. Rory Percy. Electronically Signed   By: Jeannine Boga M.D.   On: 03/24/2020 06:44   MR BRAIN W WO CONTRAST  Result Date: 03/22/2020 CLINICAL DATA:  Brain mass EXAM: MRI HEAD WITHOUT AND WITH CONTRAST TECHNIQUE: Multiplanar, multiecho pulse sequences of the brain and surrounding structures were obtained without and with intravenous contrast. CONTRAST:  13m GADAVIST GADOBUTROL 1 MMOL/ML IV SOLN COMPARISON:  03/20/2020 FINDINGS: Brain: When accounting for differences in technique (axial scan angle is slightly different), similar size of the mass centered within the left basal ganglia,  with the enhancing portion measuring up to approximately 6.4 x 2.8 by 3.6 cm. Redemonstrated nodular peripheral enhancement with central necrosis. Similar exuberant surrounding T2/stir hyperintensity with marked effacement left lateral ventricle and approximately 9 mm of rightward midline shift at the [place] No progressive ventriculomegaly. The mass restricts diffusion peripherally without central restricted diffusion. Additionally, there is a new area of restricted diffusion within the right thalamus, compatible with acute infarct (series 550, image 28). Mild associated T2/STIR hyperintensity. Vascular: Limited evaluation; however, major vessels at the base of the brain demonstrate normal flow voids. Skull and upper cervical spine: Negative Sinuses/Orbits: There is complete opacification of the right frontal, anterior ethmoid air cells, and right maxillary sinus, compatible with ostiomeatal unit pattern sinus disease. Other: Small left mastoid effusion. IMPRESSION: 1. New/acute infarct within the right thalamus. 2. 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 Resulting effacement of the left lateral ventricle and approximately 9 mm of right midline shift, not substantially changed. No progressive ventriculomegaly. 3. Similar right-sided ostiomeatal unit pattern paranasal sinusitis. Critical Value/emergent results were called by telephone at the time of interpretation on 03/22/2020 at 10:35 am to provider Dr. DReatha Armour who verbally acknowledged these results. Electronically Signed   By: FMargaretha SheffieldMD   On: 03/22/2020 10:39   MR Brain W and Wo Contrast  Result Date: 03/20/2020 CLINICAL DATA:  Acute right-sided weakness. Brain tumor shown by CT angiography. EXAM: MRI HEAD WITHOUT AND WITH CONTRAST  TECHNIQUE: Multiplanar, multiecho pulse sequences of the brain and surrounding structures were obtained without and with intravenous contrast. CONTRAST:  8.846mGADAVIST GADOBUTROL 1 MMOL/ML IV SOLN COMPARISON:  CT studies earlier same day FINDINGS: Brain: Approximately 6.5 X 4.5 x 4 cm tumor with the epicenter in the left basal ganglia and radiating white matter tracts region. Central necrosis. Contrast enhancement along the margins of the necrosis. The enhancing region measures 6 x 2.4 x 3.5 cm. There is mass effect with flattening of the left lateral ventricle. Left-to-right midline shift of 6 mm. No ventricular trapping. No satellite lesion identified. No second brain mass. No evidence of ischemic stroke. Vascular: Major vessels at the base of the brain show flow. Skull and upper cervical spine: Negative Sinuses/Orbits: Ostiomeatal unit pattern on the right with opacification of the right maxillary, ethmoid and frontal sinuses. Orbits negative. Other: None IMPRESSION: 6.5 x 4.5 x 4 cm tumor with the epicenter in the left basal ganglia and radiating white matter tracts. Central necrosis. Contrast enhancement along the margins of the necrosis. The enhancing region measures 6 x 2.4 x 3.5 cm. There is mass effect with flattening of the left lateral ventricle. Left-to-right midline shift of 6 mm. No ventricular trapping. No satellite lesion identified. 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 Right-sided paranasal sinusitis with ostiomeatal  unit pattern. Electronically Signed   By: Nelson Chimes M.D.   On: 03/20/2020 15:44   IR GASTROSTOMY TUBE MOD SED  Result Date: 04/04/2020 INDICATION: Dysphagia. Please perform percutaneous gastrostomy tube for enteric nutrition supplementation purposes. EXAM: PULL TROUGH GASTROSTOMY TUBE PLACEMENT COMPARISON:  CT abdomen and pelvis-03/20/2020 MEDICATIONS: Ancef 2 gm IV; Antibiotics were administered within 1 hour of the procedure. Glucagon 1  mg IV CONTRAST:  20 mL of Omnipaque 300 administered into the gastric lumen. ANESTHESIA/SEDATION: Moderate (conscious) sedation was employed during this procedure. A total of Versed 1 mg and Fentanyl 50 mcg was administered intravenously. Moderate Sedation Time: 20 minutes. The patient's level of consciousness and vital signs were monitored continuously by radiology nursing throughout the procedure under my direct supervision. FLUOROSCOPY TIME:  1 minutes, 12 seconds (5 mGy) COMPLICATIONS: None immediate. PROCEDURE: Informed written consent was obtained from the patient's family following explanation of the procedure, risks, benefits and alternatives. A time out was performed prior to the initiation of the procedure. Ultrasound scanning was performed to demarcate the edge of the left lobe of the liver. Maximal barrier sterile technique utilized including caps, mask, sterile gowns, sterile gloves, large sterile drape, hand hygiene and Betadine prep. The left upper quadrant was sterilely prepped and draped. An oral gastric catheter was inserted into the stomach under fluoroscopy. The existing nasogastric feeding tube was removed. The left costal margin and air opacified transverse colon were identified and avoided. Air was injected into the stomach for insufflation and visualization under fluoroscopy. Under sterile conditions a 17 gauge trocar needle was utilized to access the stomach percutaneously beneath the left subcostal margin after the overlying soft tissues were anesthetized with 1% Lidocaine with epinephrine. Needle position was confirmed within the stomach with aspiration of air and injection of small amount of contrast. A single T tack was deployed for gastropexy. Over an Amplatz guide wire, a 9-French sheath was inserted into the stomach. A snare device was utilized to capture the oral gastric catheter. The snare device was pulled retrograde from the stomach up the esophagus and out the oropharynx. The  20-French pull-through gastrostomy was connected to the snare device and pulled antegrade through the oropharynx down the esophagus into the stomach and then through the percutaneous tract external to the patient. The gastrostomy was assembled externally. Contrast injection confirms position in the stomach. Several spot radiographic images were obtained in various obliquities for documentation. The patient tolerated procedure well without immediate post procedural complication. FINDINGS: After successful fluoroscopic guided placement, the gastrostomy tube is appropriately positioned with internal disc against the ventral aspect of the gastric lumen. IMPRESSION: Successful fluoroscopic insertion of a 20-French pull-through gastrostomy tube. The gastrostomy may be used immediately for medication administration and in 24 hrs for the initiation of feeds. Electronically Signed   By: Sandi Mariscal M.D.   On: 04/04/2020 14:48   CT CHEST ABDOMEN PELVIS W CONTRAST  Result Date: 03/20/2020 CLINICAL DATA:  54 year old female with brain mass. Evaluate for metastatic disease. EXAM: CT CHEST, ABDOMEN, AND PELVIS WITH CONTRAST TECHNIQUE: Multidetector CT imaging of the chest, abdomen and pelvis was performed following the standard protocol during bolus administration of intravenous contrast. CONTRAST:  175m OMNIPAQUE IOHEXOL 350 MG/ML SOLN COMPARISON:  None. FINDINGS: CT CHEST FINDINGS Cardiovascular: There is no cardiomegaly or pericardial effusion. There is noncalcified plaque along the thoracic aorta and aortic arch. No aneurysmal dilatation or dissection. The origins of the great vessels of the aortic arch appear patent. The central pulmonary arteries are unremarkable  for the degree of opacification. Mediastinum/Nodes: Top-normal right hilar lymph nodes. No adenopathy. The esophagus and the thyroid gland are grossly unremarkable. No mediastinal fluid collection. Lungs/Pleura: There is a patchy area of consolidation with air  bronchograms involving the right lower lobe which may represent atelectasis but concerning for pneumonia. Aspiration is not excluded. Clinical correlation and follow-up to resolution recommended to exclude an underlying mass. Faint left lung base linear and platelike atelectasis noted. There is no pleural effusion or pneumothorax. The central airways are patent. Musculoskeletal: No acute osseous pathology. CT ABDOMEN PELVIS FINDINGS No intra-abdominal free air. There is a small free fluid in the pelvis. Hepatobiliary: The liver is unremarkable. No intrahepatic biliary ductal dilatation. The gallbladder is unremarkable. Pancreas: Unremarkable. No pancreatic ductal dilatation or surrounding inflammatory changes. Spleen: Normal in size without focal abnormality. Adrenals/Urinary Tract: The adrenal glands are unremarkable. There is no hydronephrosis on either side. There is symmetric enhancement and excretion of contrast by both kidneys. The visualized ureters and urinary bladder appear unremarkable. Stomach/Bowel: There is moderate stool throughout the colon. There is no bowel obstruction or active inflammation. The appendix is normal. Vascular/Lymphatic: The abdominal aorta and IVC are unremarkable. No portal venous gas. There is no adenopathy. Reproductive: Hysterectomy. There is a 9 mm calcific focus in the right posterior pelvis which is not evaluated but may be associated with the ovary. Pelvic ultrasound may provide better evaluation. Other: Anterior pelvic wall surgical scar.  No fluid collection. Musculoskeletal: Degenerative changes of the spine. No acute osseous pathology. IMPRESSION: 1. Right lower lobe consolidation concerning for pneumonia. Aspiration is not excluded. Clinical correlation and follow-up to resolution recommended to exclude an underlying mass. 2. No evidence of metastatic disease in the chest, abdomen or pelvis. 3. An indeterminate 9 mm calcific focus in the right posterior pelvis may be  associated with the ovary. Pelvic ultrasound may provide better evaluation. 4. Aortic Atherosclerosis (ICD10-I70.0). Electronically Signed   By: Anner Crete M.D.   On: 03/20/2020 19:52   DG Chest Port 1 View  Result Date: 03/28/2020 CLINICAL DATA:  Respiratory failure.  Hypoxia.  COVID positive. EXAM: PORTABLE CHEST 1 VIEW COMPARISON:  03/27/2020. FINDINGS: Endotracheal tube and NG tube in stable position. Right PICC line stable position. Heart size stable. Low lung volumes with persistent bibasilar atelectasis/infiltrates. Interim improvement in aeration from prior exam. No prominent pleural effusion. No pneumothorax. IMPRESSION: 1.  Lines and tubes stable position. 2. Low lung volumes with persistent bibasilar atelectasis/infiltrates. Interim improvement in aeration from prior exam. Electronically Signed   By: Marcello Moores  Register   On: 03/28/2020 06:14   DG CHEST PORT 1 VIEW  Result Date: 03/27/2020 CLINICAL DATA:  Respiratory failure. EXAM: PORTABLE CHEST 1 VIEW COMPARISON:  03/23/2020 FINDINGS: 0523 hours. Endotracheal tube tip has pulled back in the interval and is now approximately 6.3 cm above the base of the carina. The NG tube passes into the stomach although the distal tip position is not included on the film. Interval improvement in basilar aeration with some persistent/residual streaky opacity in the bases, likely atelectasis. No pulmonary edema or pleural effusion. The visualized bony structures of the thorax show no acute abnormality. Telemetry leads overlie the chest. IMPRESSION: 1. Interval repositioning of the endotracheal tube. 2. Interval improvement in basilar aeration with some residual probable atelectasis at the lung bases. Electronically Signed   By: Misty Stanley M.D.   On: 03/27/2020 07:25   Portable Chest x-ray  Result Date: 03/23/2020 CLINICAL DATA:  54 year old female status post intubation.  EXAM: PORTABLE CHEST 1 VIEW COMPARISON:  Chest CT dated 03/20/2020. FINDINGS:  Endotracheal tube with tip at the level of the carina tilting towards the right mainstem bronchus. Recommend retraction by approximately 4 cm for optimal positioning. Enteric tube extends below the diaphragm with tip beyond the inferior margin of the image. Bilateral streaky and hazy airspace densities may represent atelectasis or pneumonia. No large pleural effusion. No pneumothorax. The cardiac silhouette is within limits. No acute osseous pathology. IMPRESSION: 1. Endotracheal tube with tip at the level of the carina tilting towards the right mainstem bronchus. Recommend retraction by 4 cm for optimal positioning. 2. Bilateral streaky and hazy airspace densities may represent atelectasis or pneumonia. These results were called by telephone at the time of interpretation on 03/23/2020 at 12:57 am to nurse Purcell Nails, who verbally acknowledged these results. Electronically Signed   By: Anner Crete M.D.   On: 03/23/2020 01:03   [place] EKG SITE RITE  Result Date: 03/23/2020 If [facility] image not attached, placement could not be confirmed due to current cardiac rhythm.    Time Spent in minutes  >30     Desiree Hane M.D on 04/11/2020 at 2:25 PM  To page go to [email or web address] - password [facility]

## 2020-04-11 NOTE — Progress Notes (Signed)
PT Cancellation Note  Patient Details Name: Vickie Alvarado MRN: [id number] DOB: [birth year]   Cancelled Treatment:    Reason Eval/Treat Not Completed: Medical issues which prohibited therapy;PT screened, no needs identified, will sign off - pt continuing to present with eye opening only, no change from last week on PT eval. PT to sign off, please reconsult as needed. PT spoke with RN, pt on 2 hour pressure relief schedule and RN to perform PROM.   Richrd Sox, PT Acute Rehabilitation Services Pager [phone]  Office [phone]     Tekoa Hamor D Despina Hidden 04/11/2020, 10:20 AM

## 2020-04-12 ENCOUNTER — Inpatient Hospital Stay (HOSPITAL_COMMUNITY): Payer: Medicare HMO

## 2020-04-12 LAB — GLUCOSE, CAPILLARY
Glucose-Capillary: 111 mg/dL — ABNORMAL HIGH (ref 70–99)
Glucose-Capillary: 113 mg/dL — ABNORMAL HIGH (ref 70–99)
Glucose-Capillary: 131 mg/dL — ABNORMAL HIGH (ref 70–99)
Glucose-Capillary: 151 mg/dL — ABNORMAL HIGH (ref 70–99)
Glucose-Capillary: 166 mg/dL — ABNORMAL HIGH (ref 70–99)
Glucose-Capillary: 179 mg/dL — ABNORMAL HIGH (ref 70–99)

## 2020-04-12 IMAGING — DX DG ABD PORTABLE 1V
1 series · 1 of 1 positions shown · non-contrast
Comparison: [DATE]

CLINICAL DATA: Abdominal distention

EXAM:
PORTABLE ABDOMEN - 1 VIEW

[abdomen kub]
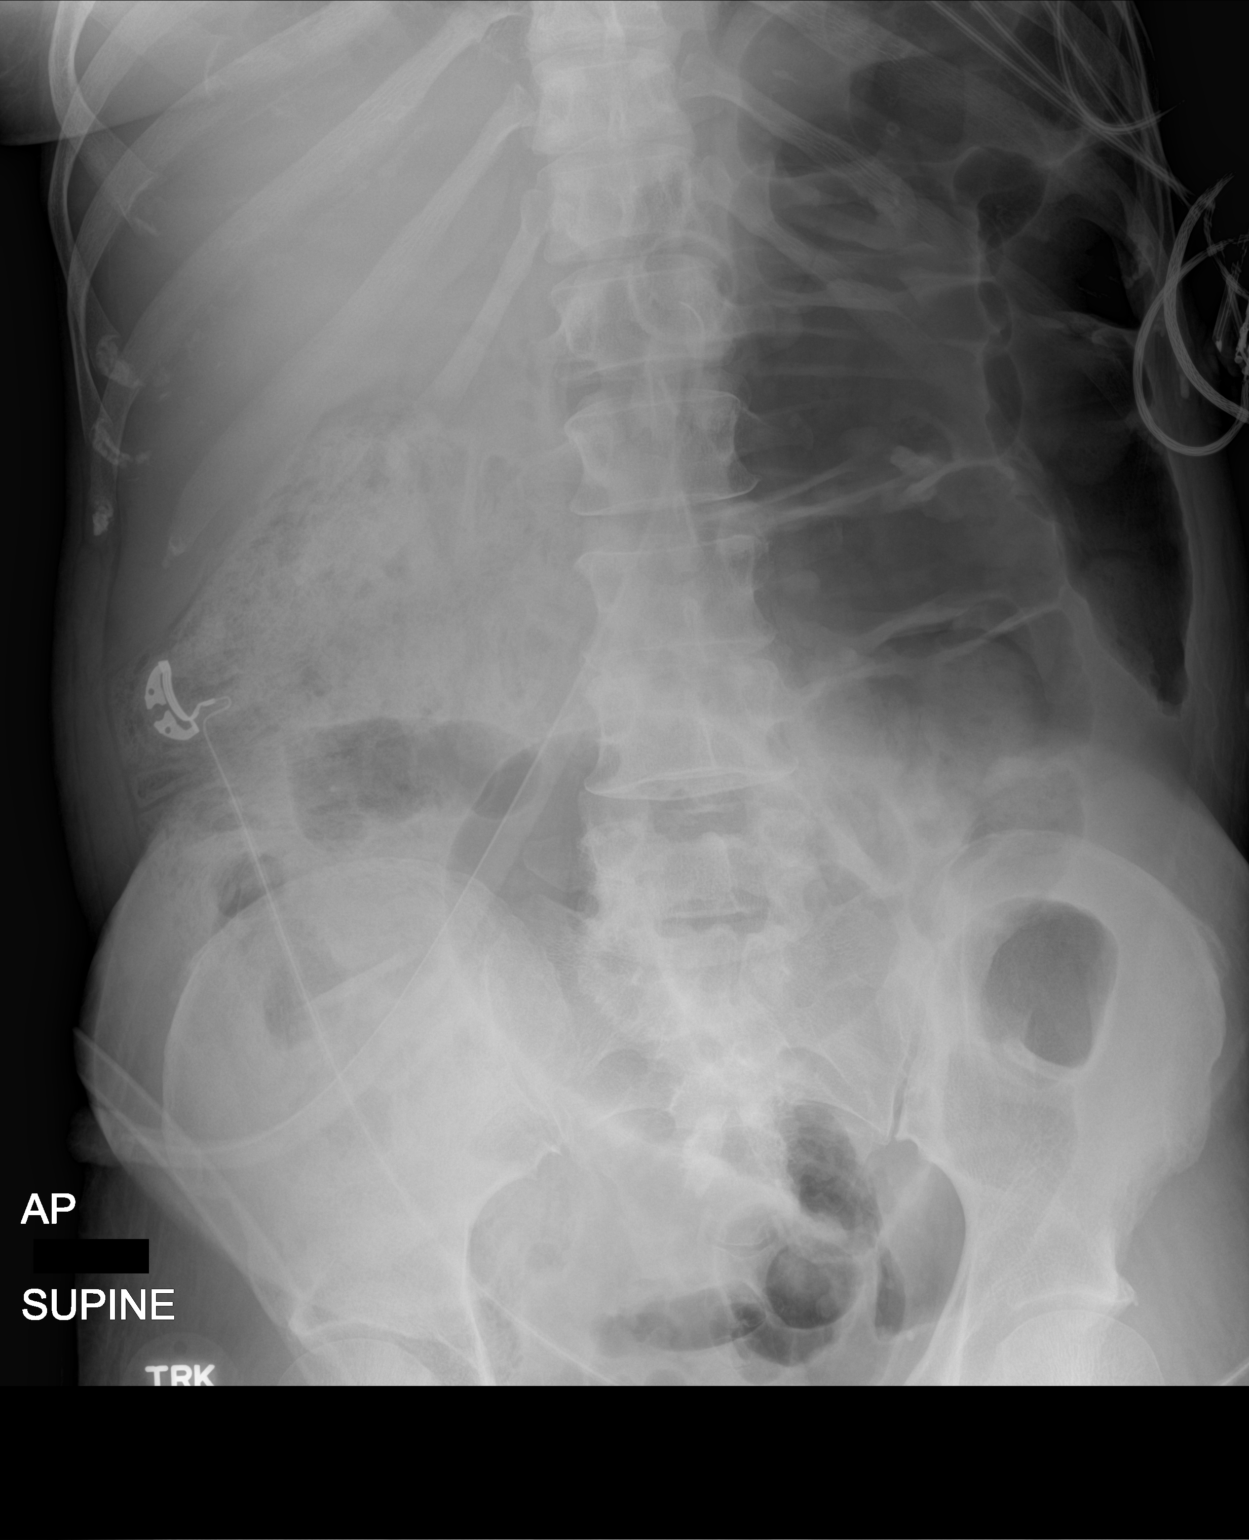

[1 of 1 positions shown; findings below may reference images not displayed]

FINDINGS: Gaseous distention of the colon noted. Moderate stool burden
particularly in the right colon. Findings are similar to prior
study. No organomegaly or free air.
IMPRESSION: Moderate stool burden in the right colon with mild gaseous
distention of the colon. No change.

## 2020-04-12 MED ORDER — POLYETHYLENE GLYCOL 3350 17 G PO PACK
17.0000 g | PACK | Freq: Two times a day (BID) | ORAL | Status: DC
  Administered 2020-04-13 – 2020-04-21 (×12): 17 g
  Filled 2020-04-12 (×15): qty 1

## 2020-04-12 MED ORDER — DOCUSATE SODIUM 283 MG RE ENEM
1.0000 | ENEMA | Freq: Once | RECTAL | Status: AC
  Administered 2020-04-12: 283 mg via RECTAL
  Filled 2020-04-12: qty 1

## 2020-04-12 NOTE — Progress Notes (Addendum)
[facility]  PROGRESS NOTE  Vickie Alvarado YIR:[id number] DOB: 11/23/1965 DOA: 03/20/2020 PCP: [facility] Admit date - 03/20/2020   Admitting Physician Jacky Kindle, MD  Outpatient Primary MD for the patient is [place]  LOS - 63 Brief Narrative   Vickie Alvarado is a 54 y.o. 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  She underwent stereotactic biopsy and aspiration of the abscess by neurosurgery on 03/22/2020 and was transferred to ICU.    Hospital course complicated by worsening mental status requiring MRI which showed new large left PCA infarct along with scattered infarcts in the right basal ganglia and right thalamus concerning for either CVA related to septic emboli versus herniation syndrome.  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 Given her large hemisphere brain abscess with surrounding edema/cerebritis neurosurgery did not recommend decompressive surger  Underwent  PEG placement on 9/9 with tube feeds starting at that time.  CT head shows improvement in fluid collections shows persistent left parietal/occipital acute/subacute infarcts with improvement in midline shift.  She has completed Decadron taper    Subjective  Last bowel movement was yesterday.  Repeat x-ray shows moderate stool burden, stable from prior imaging.  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  Status post stereotactic biopsy/aspiration by neurosurgery on 03/22/2020.  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  Neurologic exam is stable (opens eyes to voice only).  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  Repeat abdominal x-ray shows stable amount of stool burden though distension still quite present with no concerning signs for obstruction.  Patient responded well to enema yesterday but no BM today so far -We will continue bowel regimen with repeat enema -Repeat abdominal x-ray on 9/17 to monitor serially -Optimize bowel regimen disease(currently on docusate 50 mg twice daily)-- add enema and monitor output  Acute hypoxic respiratory failure, status post trach 04/01/2020, stable 5 L of O2 on trach collar - will need to be cuffless prior to discharge  HTN, stable -Continue Amlodipine 5 mg  Nutrition -Continue Prosource via feeding tube  GERD, stable -continue PPI per tube  Type 2 diabetes, poorly  controlled A1c 11.5.  CBG  at goal currently -Lantus 20 units daily, NovoLog 3 units every 4 hours while on tube feeds, CBG every 4 h  -Continue IV Rocephin, end date 05/17/2020 for a total of 8 weeks as recommended by ID  Anemia of chronic disease.  Folate levels wnl adequate iron/ferritin -Continue folate supplementation, monitor hemoglobin  History of bipolar disorder, stable -Not on home Seroquel here  Pressure ulcer in gluteal cleft/sacral area,  consistent with stage 2 on my exam (no full loss of dermis, no fat exposure) difficult to promote healing given patient is incontinent of stool -Wound consulted, follow recommendations for cleaning, and foam dressing to sacrum every day    Family Communication  : spoke with daughter at bedside  Code Status : Full  Disposition Plan  :  Patient is from home. Anticipated d/c date: >3 days. Barriers to d/c or necessity for inpatient status:  Currently optimizing bowel regimen as patient has significant stool burden need to avoid obstruction in the colonic region, en, currently requiring IV ceftriaxone for abscess and repeat MRI in 4 days per NSG Consults  : Neurosurgery, ID, PCCM, IR  Procedures  :    DVT Prophylaxis  : Heparin  Lab Results  Component Value Date   PLT 156 04/07/2020    Diet :  Diet Order            Diet NPO time specified  Diet effective midnight                  Inpatient Medications Scheduled Meds: . amLODipine  5 mg Per Tube Daily  . chlorhexidine gluconate (MEDLINE KIT)  15 mL Mouth Rinse BID  . Chlorhexidine Gluconate Cloth  6 each Topical Daily  . docusate  100 mg Per Tube BID  . docusate sodium  1 enema Rectal Once  . feeding supplement (PROSource TF)  45 mL Per Tube TID  . folic acid  1 mg Per Tube Daily  . heparin injection (subcutaneous)  5,000 Units Subcutaneous Q8H  . insulin aspart  0-20 Units Subcutaneous Q4H  . insulin aspart  3 Units Subcutaneous Q4H  . insulin glargine  20 Units  Subcutaneous Daily  . levETIRAcetam  500 mg Per Tube BID  . mouth rinse  15 mL Mouth Rinse 10 times per day  . pantoprazole sodium  40 mg Per Tube QHS  . polyethylene glycol  17 g Per Tube BID  . sodium chloride flush  10-40 mL Intracatheter Q12H   Continuous Infusions: . cefTRIAXone (ROCEPHIN)  IV 2 g (04/12/20 1151)  . dextrose 5% lactated ringers 30 mL/hr at 04/11/20 1557  . feeding supplement (JEVITY 1.5 CAL/FIBER) 1,000 mL (04/12/20 0332)   PRN Meds:.acetaminophen (TYLENOL) oral liquid 160 mg/5 mL, acetaminophen **OR** acetaminophen, fentaNYL (SUBLIMAZE) injection, hydrALAZINE, HYDROcodone-acetaminophen, labetalol, ondansetron (ZOFRAN) IV, polyethylene glycol, promethazine, sodium chloride flush  Antibiotics  :   Anti-infectives (From admission, onward)   Start     Dose/Rate Route Frequency Ordered Stop   04/04/20 1000  ceFAZolin (ANCEF) IVPB 2g/100 mL premix        2 g 200 mL/hr over 30 Minutes Intravenous To Radiology 04/04/20 0935 04/04/20 1053   04/03/20 0600  ceFAZolin (ANCEF) IVPB 1 g/50 mL premix        1 g 100 mL/hr over 30 Minutes Intravenous To Radiology 04/01/20 1239 04/04/20 0600   03/24/20 1030  vancomycin (VANCOREADY) IVPB 1500 mg/300 mL  Status:  Discontinued        1,500 mg 150 mL/hr  over 120 Minutes Intravenous Every 8 hours 03/24/20 0952 03/24/20 1336   03/23/20 0130  vancomycin (VANCOCIN) IVPB 1000 mg/200 mL premix  Status:  Discontinued        1,000 mg 200 mL/hr over 60 Minutes Intravenous Every 8 hours 03/22/20 1647 03/24/20 0952   03/22/20 2200  cefTRIAXone (ROCEPHIN) 2 g in sodium chloride 0.9 % 100 mL IVPB        2 g 200 mL/hr over 30 Minutes Intravenous Every 12 hours 03/22/20 1617     03/22/20 1730  vancomycin (VANCOREADY) IVPB 1750 mg/350 mL        1,750 mg 175 mL/hr over 120 Minutes Intravenous  Once 03/22/20 1647 03/22/20 2030   03/22/20 1630  metroNIDAZOLE (FLAGYL) IVPB 500 mg  Status:  Discontinued        500 mg 100 mL/hr over 60 Minutes  Intravenous Every 8 hours 03/22/20 1618 03/24/20 1350   03/22/20 1336  bacitracin 50,000 Units in sodium chloride 0.9 % 500 mL irrigation  Status:  Discontinued          As needed 03/22/20 1336 03/22/20 1341   03/22/20 1315  vancomycin (VANCOCIN) IVPB 1000 mg/200 mL premix  Status:  Discontinued        1,000 mg 200 mL/hr over 60 Minutes Intravenous To Surgery 03/22/20 1313 03/22/20 1545   03/22/20 1315  ceFEPIme (MAXIPIME) 2 g in sodium chloride 0.9 % 100 mL IVPB        2 g 200 mL/hr over 30 Minutes Intravenous To Surgery 03/22/20 1313 03/22/20 1354   03/22/20 1145  ceFAZolin (ANCEF) IVPB 2g/100 mL premix  Status:  Discontinued        2 g 200 mL/hr over 30 Minutes Intravenous  Once 03/22/20 1131 03/22/20 1545   03/22/20 1138  ceFAZolin (ANCEF) 2-4 GM/100ML-% IVPB       Note to Pharmacy: Gleason, Ginger   : cabinet override      03/22/20 1138 03/22/20 2344       Objective   Vitals:   04/12/20 0746 04/12/20 0753 04/12/20 1158 04/12/20 1214  BP: 115/72 115/72  109/67  Pulse: 82 67 65 81  Resp: 17  18   Temp:  98.4 F (36.9 C)    TempSrc:  Axillary    SpO2: 100%  100%   Weight:      Height:        SpO2: 100 % O2 Flow Rate (L/min): 5 L/min FiO2 (%): 28 %  Wt Readings from Last 3 Encounters:  04/11/20 69 kg  03/01/20 88 kg     Intake/Output Summary (Last 24 hours) at 04/12/2020 1501 Last data filed at 04/11/2020 1758 Gross per 24 hour  Intake --  Output 250 ml  Net -250 ml    Physical Exam:  Opens eyes follows voice today, not following commands, stable neurologic exam Lying comfortably, in no distress Normal respiratory effort, normal breath sounds Abdomen soft but distended, normal bowel sounds. Feeding tube in place      I have personally reviewed the following:   Data Reviewed:  CBC Recent Labs  Lab 04/07/20 0435  WBC 5.9  HGB 10.0*  HCT 30.4*  PLT 156  MCV 92.1  MCH 30.3  MCHC 32.9  RDW 17.2*    Chemistries  Recent Labs  Lab  04/07/20 0435 04/07/20 1444 04/08/20 0500 04/08/20 1235  NA 135  --   --   --   K 3.3*  --  4.2  --  CL 104  --   --   --   CO2 23  --   --   --   GLUCOSE 168*  --   --   --   BUN 8  --   --   --   CREATININE 0.31*  --   --   --   CALCIUM 7.0*  --   --   --   MG  --  1.5*  --  1.8   ------------------------------------------------------------------------------------------------------------------ No results for input(s): CHOL, HDL, LDLCALC, TRIG, CHOLHDL, LDLDIRECT in the last 72 hours.  Lab Results  Component Value Date   HGBA1C 11.5 (H) 03/22/2020   ------------------------------------------------------------------------------------------------------------------ No results for input(s): TSH, T4TOTAL, T3FREE, THYROIDAB in the last 72 hours.  Invalid input(s): FREET3 ------------------------------------------------------------------------------------------------------------------ No results for input(s): VITAMINB12, FOLATE, FERRITIN, TIBC, IRON, RETICCTPCT in the last 72 hours.  Coagulation profile No results for input(s): INR, PROTIME in the last 168 hours.  No results for input(s): DDIMER in the last 72 hours.  Cardiac Enzymes No results for input(s): CKMB, TROPONINI, MYOGLOBIN in the last 168 hours.  Invalid input(s): CK ------------------------------------------------------------------------------------------------------------------ No results found for: BNP  Micro Results No results found for this or any previous visit (from the past 240 hour(s)).  Radiology Reports CT Angio Head W/Cm &/Or Wo Cm  Result Date: 03/20/2020 CLINICAL DATA:  Acute neuro deficit with right-sided weakness. EXAM: CT ANGIOGRAPHY HEAD AND NECK TECHNIQUE: Multidetector CT imaging of the head and neck was performed using the standard protocol during bolus administration of intravenous contrast. Multiplanar CT image reconstructions and MIPs were obtained to evaluate the vascular anatomy. Carotid  stenosis measurements (when applicable) are obtained utilizing NASCET criteria, using the distal internal carotid diameter as the denominator. CONTRAST:  48m OMNIPAQUE IOHEXOL 350 MG/ML SOLN COMPARISON:  None. FINDINGS: CT HEAD FINDINGS Brain: Large mass lesion in the left frontal lobe. Delayed imaging reveals irregular enhancement of the periphery with central necrosis. The enhancing component of the mass measures approximately 53 x 28 mm. Surrounding low-density edema/tumor seen around the enhancement. Low-density extends into the left basal ganglia. There is compression of the left lateral ventricle and 7 mm midline shift to the right. No acute hemorrhage. Negative for hydrocephalus Vascular: Negative for hyperdense vessel Skull: Negative Sinuses: Complete opacification right maxillary sinus with bony thickening. Mucosal edema extends into the right frontal and right ethmoid sinus. Remaining sinuses clear. Mastoid clear bilaterally. Orbits: Negative Review of the MIP images confirms the above findings CTA NECK FINDINGS Aortic arch: Normal aortic arch. Azygos branching pattern. Proximal great vessels widely patent. Right carotid system: Right carotid widely patent. Mild noncalcified plaque in the right carotid bulb. Left carotid system: Left carotid widely patent. No significant stenosis or atherosclerotic disease. Vertebral arteries: Both vertebral arteries are widely patent to the basilar without stenosis. Skeleton: Disc degeneration and spurring C5-6 and C6-7. No acute skeletal abnormality. Poor dentition. Other neck: Negative for mass or adenopathy. Upper chest: Lung apices clear bilaterally Review of the MIP images confirms the above findings CTA HEAD FINDINGS Anterior circulation: Mild atherosclerotic calcification in the cavernous carotid without stenosis. Right middle cerebral artery widely patent. Hypoplastic right A1 segment. Azygos anterior cerebral artery arising from the left without significant  stenosis. There is displacement and stretching of the left MCA branches due to the large left frontal mass. Posterior circulation: Both vertebral arteries patent to the basilar. PICA patent bilaterally. Basilar widely patent. AICA, superior cerebellar, and posterior cerebral arteries patent bilaterally without stenosis. Venous sinuses: Normal  venous enhancement Anatomic variants: None Review of the MIP images confirms the above findings IMPRESSION: 1. Large mass lesion left frontal lobe compatible with tumor. Favor glioblastoma. 2. Negative for acute infarct or hemorrhage 3. No significant carotid or vertebral artery stenosis in the neck. 4. Negative for intracranial large vessel occlusion. Electronically Signed   By: Franchot Gallo M.D.   On: 03/20/2020 13:29   DG Abd 1 View  Result Date: 04/11/2020 CLINICAL DATA:  Abdominal distension. EXAM: ABDOMEN - 1 VIEW COMPARISON:  04/10/2020 FINDINGS: Gastrostomy projects over the left upper abdomen. Nonobstructive bowel gas pattern. Moderate stool in the right colon. Gas within the sigmoid colon with decreased distention since prior study. No organomegaly or free air. IMPRESSION: Continued large stool burden in the right colon. No evidence of bowel obstruction. Electronically Signed   By: Rolm Baptise M.D.   On: 04/11/2020 09:26   DG Abd 1 View  Result Date: 04/10/2020 CLINICAL DATA:  Abdominal distension EXAM: ABDOMEN - 1 VIEW COMPARISON:  CT 12/19/2019 FINDINGS: Large proximal colonic stool burden with air distention of the distal colonic segments and some mild thickening of the haustra distally. An air distended loop seen in the left lower quadrant is present with lack of air and stool over the rectal vault. Percutaneous feeding tube projects over left upper abdomen. Atelectatic changes in the lung bases. No suspicious calcifications. Mild degenerative changes in the spine and pelvis. IMPRESSION: 1. Large proximal colonic stool burden with air distention of the  distal colonic segments and some suspect thickening of the haustra distally. Air distended loop seen in the left lower quadrant may reflect a distended sigmoid with lack of air and stool over the rectal vault. Features could reflect constipation, though early distal colonic obstruction/sigmoid volvulus could present with a similar appearance. 2. Percutaneous gastrostomy in the left upper quadrant. Electronically Signed   By: Lovena Le M.D.   On: 04/10/2020 15:57   CT HEAD WO CONTRAST  Result Date: 04/08/2020 CLINICAL DATA:  Stroke. Cerebritis. Cerebral abscess. Left-sided ptosis. EXAM: CT HEAD WITHOUT CONTRAST TECHNIQUE: Contiguous axial images were obtained from the base of the skull through the vertex without intravenous contrast. COMPARISON:  MR head without and with contrast 03/24/2020. CT head without contrast 03/26/2020. FINDINGS: Brain: Previously noted fluid collections in the left operculum have significantly improved. No fluid levels or gas collections are present. Attenuation of the left lentiform nucleus is restored. Persistent hypoattenuation is present throughout the left parietal and occipital white matter. Left occipital cortical infarcts are noted. Midline shift of 2-3 mm is improved. Vascular: Atherosclerotic calcifications are present within the cavernous internal carotid arteries bilaterally. No hyperdense vessel is present. Skull: Left frontal burr hole is noted. Calvarium is otherwise intact. No significant extracranial soft tissue lesion is present. Sinuses/Orbits: Chronic right maxillary sinus specification is present. Bilateral mastoid effusions are present, left greater than right. Middle ear cavity is clear. No obstruction is present. Sinuses are otherwise clear. The globes and orbits are within normal limits. IMPRESSION: 1. Significantly improved fluid collections in the left operculum. No fluid levels or gas collections are present. 2. Persistent hypoattenuation throughout the  left parietal and occipital white matter compatible with acute/subacute infarcts. 3. Left occipital cortical infarcts. 4. Improved midline shift of 2-3 mm. 5. Bilateral mastoid effusions, left greater than right. No obstruction is present. 6. Chronic right maxillary sinus disease. Electronically Signed   By: San Morelle M.D.   On: 04/08/2020 14:48   CT HEAD WO CONTRAST  Result  Date: 03/26/2020 CLINICAL DATA:  Cerebral edema.  Status post biopsy. EXAM: CT HEAD WITHOUT CONTRAST TECHNIQUE: Contiguous axial images were obtained from the base of the skull through the vertex without intravenous contrast. COMPARISON:  None. FINDINGS: Brain: Decreased pneumocephalus at the left frontal biopsy site. The degree of edema throughout the left hemisphere has improved slightly. But, there is a new left posterior cerebral artery territory infarct. Rightward midline shift measures 6 mm at the level of the foramina of [place]. There is improved patency of the basal cisterns. No hydrocephalus. Vascular: No hyperdense vessel or unexpected calcification. Skull: Normal. Negative for fracture or focal lesion. Sinuses/Orbits: Complete opacification of the right frontal and maxillary sinuses. Normal orbits. Other: None IMPRESSION: 1. Slightly improved edema throughout the left hemisphere with 6 mm rightward midline shift. Improved patency of the basal cisterns. 2. Left posterior cerebral artery territory infarct. Electronically Signed   By: Ulyses Jarred M.D.   On: 03/26/2020 01:51   CT HEAD WO CONTRAST  Result Date: 03/22/2020 CLINICAL DATA:  Mental status change. Recent diagnosis of brain lesion post biopsy. EXAM: CT HEAD WITHOUT CONTRAST TECHNIQUE: Contiguous axial images were obtained from the base of the skull through the vertex without intravenous contrast. COMPARISON:  CT 8 hours ago.  Brain MRI earlier today. FINDINGS: Brain: Stable size and appearance of the left brain lesion centered in the basal ganglia. Stable  internal air or Gel-Foam. Stable regional mass effect. Left-to-right midline shift of 9 mm, previously 11 mm. No evidence of lesional or acute hemorrhage. Stable sulcal effacement. Lacunar infarct in the right thalamus better appreciated on prior MRI. Stable ventricular size. Basilar cisterns are patent. No developing subdural collection. Vascular: No hyperdense vessel. Skull: Left frontal burr hole.  No fracture or focal lesion. Sinuses/Orbits: Unchanged appearance of right maxillary and frontal sinusitis with opacification of anterior ethmoid air cells. Other: Post biopsy changes in the left frontal scalp. IMPRESSION: 1. Stable size and appearance of the left brain lesion centered in the basal ganglia with internal air or Gel-Foam. Stable regional mass effect. Slightly diminished left-to-right midline shift of 9 mm, previously 11 mm. 2. No evidence of hemorrhage or new abnormality. Electronically Signed   By: Keith Rake M.D.   On: 03/22/2020 23:33   CT HEAD WO CONTRAST  Result Date: 03/22/2020 CLINICAL DATA:  Follow-up brain biopsy. EXAM: CT HEAD WITHOUT CONTRAST TECHNIQUE: Contiguous axial images were obtained from the base of the skull through the vertex without intravenous contrast. COMPARISON:  MRI earlier same day.  CT yesterday. FINDINGS: Brain: Left frontal burr hole. Biopsy site with air or Gel-Foam in the region of the previous tumor with the epicenter in the left basal ganglia. No evidence of postsurgical hemorrhage. No change in regional mass effect with left-to-right shift 11 mm. No extra-axial collection. Vascular: No abnormal vascular finding. Skull: Otherwise negative Sinuses/Orbits: Ostiomeatal unit pattern of the paranasal sinuses on the right. Other: None IMPRESSION: Biopsy site with air or Gel-Foam in the region of the previous tumor with the epicenter in the left basal ganglia. No evidence of postsurgical hemorrhage. No change in regional mass effect with left-to-right shift 11 mm.  Electronically Signed   By: Nelson Chimes M.D.   On: 03/22/2020 15:58   CT HEAD WO CONTRAST  Result Date: 03/21/2020 CLINICAL DATA:  Brain mass. EXAM: CT HEAD WITHOUT CONTRAST TECHNIQUE: Contiguous axial images were obtained from the base of the skull through the vertex without intravenous contrast. COMPARISON:  Brain MRI from yesterday FINDINGS: Brain:  Known mass with central low-density and extensive hemispheric low-density and swelling centered in the deep left cerebrum. Dimensions were better assessed on postcontrast imaging from yesterday. No interval hemorrhage or herniation. Midline shift measures 1 cm without entrapment. Vascular: Negative Skull: Negative Sinuses/Orbits: Chronic right-sided sinusitis with obstruction at the level of the middle meatus. Other: Motion artifact IMPRESSION: 1. BrainLAB protocol but motion artifact at the vertex and skull base due to confusion. If not adequate for intraoperative localization a sedated scan may be required. 2. No new findings when compared to preceding brain MRI. Electronically Signed   By: Monte Fantasia M.D.   On: 03/21/2020 11:32   CT Angio Neck W and/or Wo Contrast  Result Date: 03/20/2020 CLINICAL DATA:  Acute neuro deficit with right-sided weakness. EXAM: CT ANGIOGRAPHY HEAD AND NECK TECHNIQUE: Multidetector CT imaging of the head and neck was performed using the standard protocol during bolus administration of intravenous contrast. Multiplanar CT image reconstructions and MIPs were obtained to evaluate the vascular anatomy. Carotid stenosis measurements (when applicable) are obtained utilizing NASCET criteria, using the distal internal carotid diameter as the denominator. CONTRAST:  77m OMNIPAQUE IOHEXOL 350 MG/ML SOLN COMPARISON:  None. FINDINGS: CT HEAD FINDINGS Brain: Large mass lesion in the left frontal lobe. Delayed imaging reveals irregular enhancement of the periphery with central necrosis. The enhancing component of the mass measures  approximately 53 x 28 mm. Surrounding low-density edema/tumor seen around the enhancement. Low-density extends into the left basal ganglia. There is compression of the left lateral ventricle and 7 mm midline shift to the right. No acute hemorrhage. Negative for hydrocephalus Vascular: Negative for hyperdense vessel Skull: Negative Sinuses: Complete opacification right maxillary sinus with bony thickening. Mucosal edema extends into the right frontal and right ethmoid sinus. Remaining sinuses clear. Mastoid clear bilaterally. Orbits: Negative Review of the MIP images confirms the above findings CTA NECK FINDINGS Aortic arch: Normal aortic arch. Azygos branching pattern. Proximal great vessels widely patent. Right carotid system: Right carotid widely patent. Mild noncalcified plaque in the right carotid bulb. Left carotid system: Left carotid widely patent. No significant stenosis or atherosclerotic disease. Vertebral arteries: Both vertebral arteries are widely patent to the basilar without stenosis. Skeleton: Disc degeneration and spurring C5-6 and C6-7. No acute skeletal abnormality. Poor dentition. Other neck: Negative for mass or adenopathy. Upper chest: Lung apices clear bilaterally Review of the MIP images confirms the above findings CTA HEAD FINDINGS Anterior circulation: Mild atherosclerotic calcification in the cavernous carotid without stenosis. Right middle cerebral artery widely patent. Hypoplastic right A1 segment. Azygos anterior cerebral artery arising from the left without significant stenosis. There is displacement and stretching of the left MCA branches due to the large left frontal mass. Posterior circulation: Both vertebral arteries patent to the basilar. PICA patent bilaterally. Basilar widely patent. AICA, superior cerebellar, and posterior cerebral arteries patent bilaterally without stenosis. Venous sinuses: Normal venous enhancement Anatomic variants: None Review of the MIP images confirms  the above findings IMPRESSION: 1. Large mass lesion left frontal lobe compatible with tumor. Favor glioblastoma. 2. Negative for acute infarct or hemorrhage 3. No significant carotid or vertebral artery stenosis in the neck. 4. Negative for intracranial large vessel occlusion. Electronically Signed   By: CFranchot GalloM.D.   On: 03/20/2020 13:29   MR ANGIO HEAD WO CONTRAST  Result Date: 03/24/2020 CLINICAL DATA:  54year old female with history of brain abscess, status post stereotactic aspiration/drainage. EXAM: MRI HEAD WITHOUT AND WITH CONTRAST MRA HEAD WITHOUT CONTRAST TECHNIQUE: Multiplanar, multiecho pulse  sequences of the brain and surrounding structures were obtained without and with intravenous contrast. Angiographic images of the head were obtained using MRA technique without contrast. CONTRAST:  43m GADAVIST GADOBUTROL 1 MMOL/ML IV SOLN COMPARISON:  Prior MRI from 03/22/2020 and 03/20/2020. FINDINGS: MRI HEAD FINDINGS Brain: Postoperative changes from interval stereotactic aspiration of previously identified lesion centered at the left basal ganglia is seen. Aspiration tract with a small amount of susceptibility artifacts seen extending via a left frontal approach. The lesion at the left basal ganglia is slightly decreased in size now measuring 5.1 x 2.4 x 2.7 cm, previously 6.0 x 2.4 x 3.5 cm. Associated internal fluid-fluid level with restricted diffusion. Small focus of pneumocephalus at the nondependent portion of this lesion. Per history, this lesion was found to be most consistent with an intracerebral infection/abscess. Persistent surrounding vasogenic edema throughout the adjacent left cerebral hemisphere with regional mass effect and effacement of the left lateral ventricle. Edema extends into the left cerebral peduncle and left midbrain, similar to previous. Associated left-to-right shift has worsened now measuring up to 10 mm, previously 6 mm. No overt hydrocephalus or ventricular trapping  at this time. Crowding of the basilar cisterns with mass effect on the adjacent midbrain, similar to slightly worsened. No transtentorial herniation. 1 cm right thalamic infarct is similar to previous. No associated hemorrhage. Interval development of confluent restricted diffusion throughout the left temporal occipital region, left PCA distribution, consistent with acute ischemia. No associated hemorrhage. Multiple additional new patchy ischemic infarcts involving the bilateral thalami, splenium, parasagittal right temporal occipital region, and left dorsal pons. Right thalamic ischemic changes extend into the right midbrain. These infarcts involve the posterior circulation. An additional curvilinear infarct extending from the right lentiform nucleus towards the right caudate noted as well, favored to be anterior distribution (series 6, image 76). Patchy restricted diffusion also seen in the region of the left hypothalamus (series 6, image 69). Additional small cortical infarct noted at the posterior right frontotemporal region (series 6, image 80). Associated mild petechial hemorrhage at the left dorsal pons without hemorrhagic transformation. Findings favored to be embolic in nature. Mild left a meningeal enhancement noted involving the parasagittal left occipital region (series 29, image 7). While this finding could be reactive in nature related to adjacent left PCA territory infarct, a degree of underlying left a meningeal meningitis related to infection could also be considered. Pituitary stalk is thickened and enhancing as well. Vascular: Major intracranial vascular flow voids are maintained. Normal opacification seen throughout the major dural sinuses following contrast administration. Skull and upper cervical spine: Craniocervical junction within normal limits. No transtentorial herniation. Bone marrow signal intensity normal. No scalp soft tissue abnormality. Sinuses/Orbits: Globes and orbital soft tissues  within normal limits. Extensive paranasal sinus disease involving the right frontoethmoidal and right maxillary sinuses noted. Fluid seen within the nasopharynx. Small bilateral mastoid effusions. Patient is intubated. Other: None. MRA HEAD FINDINGS ANTERIOR CIRCULATION: Examination degraded by motion artifact. Visualized distal cervical segments of the internal carotid arteries are patent with symmetric antegrade flow. Petrous, cavernous, and supraclinoid ICAs patent without stenosis or other abnormality. Left A1 patent. Right A1 hypoplastic and/or absent, accounting for the diminutive right ICA is compared to the left. Widely patent azygos ACA. No M1 stenosis or occlusion. Normal MCA bifurcations. Distal MCA branches well perfused and symmetric. POSTERIOR CIRCULATION: Vertebral arteries patent to the vertebrobasilar junction without stenosis. Right PICA patent. Left PICA not seen. Basilar mildly irregular but patent to its distal aspect without high-grade stenosis.  Superior cerebral arteries patent bilaterally. Both PCAs primarily supplied via the basilar. Irregularity about the PCAs bilaterally with associated mild to moderate stenoses, greater on the left. Specific note made of a moderate left P2 stenosis (series 28, image 14) PCAs remain well perfused to their distal aspects. No aneurysm. IMPRESSION: MRI HEAD IMPRESSION: 1. Postoperative changes from stereotactic aspiration/drainage of left basal ganglia lesion without complication. Lesion is decreased in size now measuring 5.1 x 2.4 x 2.7 cm. Per history, this is consistent with and intracerebral abscess. 2. Persistent associated vasogenic edema throughout the left cerebral hemisphere with mildly worsened 10 mm left-to-right shift. 3. Interval development of multifocal predominantly posterior circulation infarcts, including a large left PCA territory infarct. Minimal petechial hemorrhage at the left pons without hemorrhagic transformation. A central  thromboembolic source is suspected. 4. Patchy leptomeningeal enhancement within the posterior left occipital region. While this may be reactive to the adjacent left PCA territory infarct, a degree of leptomeningeal meningitis related to intracerebral infection could also be considered. 5. Extensive right-sided paranasal sinus disease. MRA HEAD IMPRESSION: 1. Negative intracranial MRA for large vessel occlusion. 2. Scattered vascular irregularity involving the basilar and bilateral PCAs without occlusion. No proximal high-grade or correctable stenosis. No aneurysm. Findings discussed by called by telephone on 03/24/2020 at approximately 5:30 am with provider Dr. Rory Percy. Electronically Signed   By: Jeannine Boga M.D.   On: 03/24/2020 06:44   MR BRAIN W WO CONTRAST  Result Date: 03/24/2020 CLINICAL DATA:  53 year old female with history of brain abscess, status post stereotactic aspiration/drainage. EXAM: MRI HEAD WITHOUT AND WITH CONTRAST MRA HEAD WITHOUT CONTRAST TECHNIQUE: Multiplanar, multiecho pulse sequences of the brain and surrounding structures were obtained without and with intravenous contrast. Angiographic images of the head were obtained using MRA technique without contrast. CONTRAST:  78m GADAVIST GADOBUTROL 1 MMOL/ML IV SOLN COMPARISON:  Prior MRI from 03/22/2020 and 03/20/2020. FINDINGS: MRI HEAD FINDINGS Brain: Postoperative changes from interval stereotactic aspiration of previously identified lesion centered at the left basal ganglia is seen. Aspiration tract with a small amount of susceptibility artifacts seen extending via a left frontal approach. The lesion at the left basal ganglia is slightly decreased in size now measuring 5.1 x 2.4 x 2.7 cm, previously 6.0 x 2.4 x 3.5 cm. Associated internal fluid-fluid level with restricted diffusion. Small focus of pneumocephalus at the nondependent portion of this lesion. Per history, this lesion was found to be most consistent with an  intracerebral infection/abscess. Persistent surrounding vasogenic edema throughout the adjacent left cerebral hemisphere with regional mass effect and effacement of the left lateral ventricle. Edema extends into the left cerebral peduncle and left midbrain, similar to previous. Associated left-to-right shift has worsened now measuring up to 10 mm, previously 6 mm. No overt hydrocephalus or ventricular trapping at this time. Crowding of the basilar cisterns with mass effect on the adjacent midbrain, similar to slightly worsened. No transtentorial herniation. 1 cm right thalamic infarct is similar to previous. No associated hemorrhage. Interval development of confluent restricted diffusion throughout the left temporal occipital region, left PCA distribution, consistent with acute ischemia. No associated hemorrhage. Multiple additional new patchy ischemic infarcts involving the bilateral thalami, splenium, parasagittal right temporal occipital region, and left dorsal pons. Right thalamic ischemic changes extend into the right midbrain. These infarcts involve the posterior circulation. An additional curvilinear infarct extending from the right lentiform nucleus towards the right caudate noted as well, favored to be anterior distribution (series 6, image 76). Patchy restricted diffusion also seen in  the region of the left hypothalamus (series 6, image 69). Additional small cortical infarct noted at the posterior right frontotemporal region (series 6, image 80). Associated mild petechial hemorrhage at the left dorsal pons without hemorrhagic transformation. Findings favored to be embolic in nature. Mild left a meningeal enhancement noted involving the parasagittal left occipital region (series 29, image 7). While this finding could be reactive in nature related to adjacent left PCA territory infarct, a degree of underlying left a meningeal meningitis related to infection could also be considered. Pituitary stalk is  thickened and enhancing as well. Vascular: Major intracranial vascular flow voids are maintained. Normal opacification seen throughout the major dural sinuses following contrast administration. Skull and upper cervical spine: Craniocervical junction within normal limits. No transtentorial herniation. Bone marrow signal intensity normal. No scalp soft tissue abnormality. Sinuses/Orbits: Globes and orbital soft tissues within normal limits. Extensive paranasal sinus disease involving the right frontoethmoidal and right maxillary sinuses noted. Fluid seen within the nasopharynx. Small bilateral mastoid effusions. Patient is intubated. Other: None. MRA HEAD FINDINGS ANTERIOR CIRCULATION: Examination degraded by motion artifact. Visualized distal cervical segments of the internal carotid arteries are patent with symmetric antegrade flow. Petrous, cavernous, and supraclinoid ICAs patent without stenosis or other abnormality. Left A1 patent. Right A1 hypoplastic and/or absent, accounting for the diminutive right ICA is compared to the left. Widely patent azygos ACA. No M1 stenosis or occlusion. Normal MCA bifurcations. Distal MCA branches well perfused and symmetric. POSTERIOR CIRCULATION: Vertebral arteries patent to the vertebrobasilar junction without stenosis. Right PICA patent. Left PICA not seen. Basilar mildly irregular but patent to its distal aspect without high-grade stenosis. Superior cerebral arteries patent bilaterally. Both PCAs primarily supplied via the basilar. Irregularity about the PCAs bilaterally with associated mild to moderate stenoses, greater on the left. Specific note made of a moderate left P2 stenosis (series 28, image 14) PCAs remain well perfused to their distal aspects. No aneurysm. IMPRESSION: MRI HEAD IMPRESSION: 1. Postoperative changes from stereotactic aspiration/drainage of left basal ganglia lesion without complication. Lesion is decreased in size now measuring 5.1 x 2.4 x 2.7 cm. Per  history, this is consistent with and intracerebral abscess. 2. Persistent associated vasogenic edema throughout the left cerebral hemisphere with mildly worsened 10 mm left-to-right shift. 3. Interval development of multifocal predominantly posterior circulation infarcts, including a large left PCA territory infarct. Minimal petechial hemorrhage at the left pons without hemorrhagic transformation. A central thromboembolic source is suspected. 4. Patchy leptomeningeal enhancement within the posterior left occipital region. While this may be reactive to the adjacent left PCA territory infarct, a degree of leptomeningeal meningitis related to intracerebral infection could also be considered. 5. Extensive right-sided paranasal sinus disease. MRA HEAD IMPRESSION: 1. Negative intracranial MRA for large vessel occlusion. 2. Scattered vascular irregularity involving the basilar and bilateral PCAs without occlusion. No proximal high-grade or correctable stenosis. No aneurysm. Findings discussed by called by telephone on 03/24/2020 at approximately 5:30 am with provider Dr. Rory Percy. Electronically Signed   By: Jeannine Boga M.D.   On: 03/24/2020 06:44   MR BRAIN W WO CONTRAST  Result Date: 03/22/2020 CLINICAL DATA:  Brain mass EXAM: MRI HEAD WITHOUT AND WITH CONTRAST TECHNIQUE: Multiplanar, multiecho pulse sequences of the brain and surrounding structures were obtained without and with intravenous contrast. CONTRAST:  51m GADAVIST GADOBUTROL 1 MMOL/ML IV SOLN COMPARISON:  03/20/2020 FINDINGS: Brain: When accounting for differences in technique (axial scan angle is slightly different), similar size of the mass centered within the left basal ganglia,  with the enhancing portion measuring up to approximately 6.4 x 2.8 by 3.6 cm. Redemonstrated nodular peripheral enhancement with central necrosis. Similar exuberant surrounding T2/stir hyperintensity with marked effacement left lateral ventricle and approximately 9 mm of  rightward midline shift at the foramina [place]. No progressive ventriculomegaly. The mass restricts diffusion peripherally without central restricted diffusion. Additionally, there is a new area of restricted diffusion within the right thalamus, compatible with acute infarct (series 550, image 28). Mild associated T2/STIR hyperintensity. Vascular: Limited evaluation; however, major vessels at the base of the brain demonstrate normal flow voids. Skull and upper cervical spine: Negative Sinuses/Orbits: There is complete opacification of the right frontal, anterior ethmoid air cells, and right maxillary sinus, compatible with ostiomeatal unit pattern sinus disease. Other: Small left mastoid effusion. IMPRESSION: 1. New/acute infarct within the right thalamus. 2. 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 Resulting effacement of the left lateral ventricle and approximately 9 mm of right midline shift, not substantially changed. No progressive ventriculomegaly. 3. Similar right-sided ostiomeatal unit pattern paranasal sinusitis. Critical Value/emergent results were called by telephone at the time of interpretation on 03/22/2020 at 10:35 am to provider Dr. Reatha Armour, who verbally acknowledged these results. Electronically Signed   By: Margaretha Sheffield MD   On: 03/22/2020 10:39   MR Brain W and Wo Contrast  Result Date: 03/20/2020 CLINICAL DATA:  Acute right-sided weakness. Brain tumor shown by CT angiography. EXAM: MRI HEAD WITHOUT AND WITH CONTRAST TECHNIQUE: Multiplanar, multiecho pulse sequences of the brain and surrounding structures were obtained without and with intravenous contrast. CONTRAST:  8.48m GADAVIST GADOBUTROL 1 MMOL/ML IV SOLN COMPARISON:  CT studies earlier same day FINDINGS: Brain: Approximately  6.5 X 4.5 x 4 cm tumor with the epicenter in the left basal ganglia and radiating white matter tracts region. Central necrosis. Contrast enhancement along the margins of the necrosis. The enhancing region measures 6 x 2.4 x 3.5 cm. There is mass effect with flattening of the left lateral ventricle. Left-to-right midline shift of 6 mm. No ventricular trapping. No satellite lesion identified. No second brain mass. No evidence of ischemic stroke. Vascular: Major vessels at the base of the brain show flow. Skull and upper cervical spine: Negative Sinuses/Orbits: Ostiomeatal unit pattern on the right with opacification of the right maxillary, ethmoid and frontal sinuses. Orbits negative. Other: None IMPRESSION: 6.5 x 4.5 x 4 cm tumor with the epicenter in the left basal ganglia and radiating white matter tracts. Central necrosis. Contrast enhancement along the margins of the necrosis. The enhancing region measures 6 x 2.4 x 3.5 cm. There is mass effect with flattening of the left lateral ventricle. Left-to-right midline shift of 6 mm. No ventricular trapping. No satellite lesion identified. 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 Right-sided paranasal sinusitis with ostiomeatal unit pattern. Electronically Signed   By: MNelson ChimesM.D.   On: 03/20/2020 15:44   IR GASTROSTOMY TUBE MOD SED  Result Date: 04/04/2020 INDICATION: Dysphagia. Please perform percutaneous gastrostomy tube for enteric nutrition supplementation purposes. EXAM: PULL TROUGH GASTROSTOMY TUBE PLACEMENT COMPARISON:  CT abdomen and pelvis-03/20/2020 MEDICATIONS: Ancef 2 gm IV; Antibiotics were administered within 1 hour of the procedure. Glucagon 1 mg IV CONTRAST:  20 mL of Omnipaque 300 administered into the gastric lumen. ANESTHESIA/SEDATION: Moderate (conscious) sedation was employed during this procedure. A total of Versed 1  mg and Fentanyl 50 mcg was administered intravenously. Moderate Sedation Time: 20  minutes. The patient's level of consciousness and vital signs were monitored continuously by radiology nursing throughout the procedure under my direct supervision. FLUOROSCOPY TIME:  1 minutes, 12 seconds (5 mGy) COMPLICATIONS: None immediate. PROCEDURE: Informed written consent was obtained from the patient's family following explanation of the procedure, risks, benefits and alternatives. A time out was performed prior to the initiation of the procedure. Ultrasound scanning was performed to demarcate the edge of the left lobe of the liver. Maximal barrier sterile technique utilized including caps, mask, sterile gowns, sterile gloves, large sterile drape, hand hygiene and Betadine prep. The left upper quadrant was sterilely prepped and draped. An oral gastric catheter was inserted into the stomach under fluoroscopy. The existing nasogastric feeding tube was removed. The left costal margin and air opacified transverse colon were identified and avoided. Air was injected into the stomach for insufflation and visualization under fluoroscopy. Under sterile conditions a 17 gauge trocar needle was utilized to access the stomach percutaneously beneath the left subcostal margin after the overlying soft tissues were anesthetized with 1% Lidocaine with epinephrine. Needle position was confirmed within the stomach with aspiration of air and injection of small amount of contrast. A single T tack was deployed for gastropexy. Over an Amplatz guide wire, a 9-French sheath was inserted into the stomach. A snare device was utilized to capture the oral gastric catheter. The snare device was pulled retrograde from the stomach up the esophagus and out the oropharynx. The 20-French pull-through gastrostomy was connected to the snare device and pulled antegrade through the oropharynx down the esophagus into the stomach and then through the percutaneous tract external to the patient. The gastrostomy was assembled externally. Contrast  injection confirms position in the stomach. Several spot radiographic images were obtained in various obliquities for documentation. The patient tolerated procedure well without immediate post procedural complication. FINDINGS: After successful fluoroscopic guided placement, the gastrostomy tube is appropriately positioned with internal disc against the ventral aspect of the gastric lumen. IMPRESSION: Successful fluoroscopic insertion of a 20-French pull-through gastrostomy tube. The gastrostomy may be used immediately for medication administration and in 24 hrs for the initiation of feeds. Electronically Signed   By: Sandi Mariscal M.D.   On: 04/04/2020 14:48   CT CHEST ABDOMEN PELVIS W CONTRAST  Result Date: 03/20/2020 CLINICAL DATA:  54 year old female with brain mass. Evaluate for metastatic disease. EXAM: CT CHEST, ABDOMEN, AND PELVIS WITH CONTRAST TECHNIQUE: Multidetector CT imaging of the chest, abdomen and pelvis was performed following the standard protocol during bolus administration of intravenous contrast. CONTRAST:  155m OMNIPAQUE IOHEXOL 350 MG/ML SOLN COMPARISON:  None. FINDINGS: CT CHEST FINDINGS Cardiovascular: There is no cardiomegaly or pericardial effusion. There is noncalcified plaque along the thoracic aorta and aortic arch. No aneurysmal dilatation or dissection. The origins of the great vessels of the aortic arch appear patent. The central pulmonary arteries are unremarkable for the degree of opacification. Mediastinum/Nodes: Top-normal right hilar lymph nodes. No adenopathy. The esophagus and the thyroid gland are grossly unremarkable. No mediastinal fluid collection. Lungs/Pleura: There is a patchy area of consolidation with air bronchograms involving the right lower lobe which may represent atelectasis but concerning for pneumonia. Aspiration is not excluded. Clinical correlation and follow-up to resolution recommended to exclude an underlying mass. Faint left lung base linear and  platelike atelectasis noted. There is no pleural effusion or pneumothorax. The central airways are patent. Musculoskeletal: No acute osseous pathology. CT ABDOMEN  PELVIS FINDINGS No intra-abdominal free air. There is a small free fluid in the pelvis. Hepatobiliary: The liver is unremarkable. No intrahepatic biliary ductal dilatation. The gallbladder is unremarkable. Pancreas: Unremarkable. No pancreatic ductal dilatation or surrounding inflammatory changes. Spleen: Normal in size without focal abnormality. Adrenals/Urinary Tract: The adrenal glands are unremarkable. There is no hydronephrosis on either side. There is symmetric enhancement and excretion of contrast by both kidneys. The visualized ureters and urinary bladder appear unremarkable. Stomach/Bowel: There is moderate stool throughout the colon. There is no bowel obstruction or active inflammation. The appendix is normal. Vascular/Lymphatic: The abdominal aorta and IVC are unremarkable. No portal venous gas. There is no adenopathy. Reproductive: Hysterectomy. There is a 9 mm calcific focus in the right posterior pelvis which is not evaluated but may be associated with the ovary. Pelvic ultrasound may provide better evaluation. Other: Anterior pelvic wall surgical scar.  No fluid collection. Musculoskeletal: Degenerative changes of the spine. No acute osseous pathology. IMPRESSION: 1. Right lower lobe consolidation concerning for pneumonia. Aspiration is not excluded. Clinical correlation and follow-up to resolution recommended to exclude an underlying mass. 2. No evidence of metastatic disease in the chest, abdomen or pelvis. 3. An indeterminate 9 mm calcific focus in the right posterior pelvis may be associated with the ovary. Pelvic ultrasound may provide better evaluation. 4. Aortic Atherosclerosis (ICD10-I70.0). Electronically Signed   By: Anner Crete M.D.   On: 03/20/2020 19:52   DG Chest Port 1 View  Result Date: 03/28/2020 CLINICAL DATA:   Respiratory failure.  Hypoxia.  COVID positive. EXAM: PORTABLE CHEST 1 VIEW COMPARISON:  03/27/2020. FINDINGS: Endotracheal tube and NG tube in stable position. Right PICC line stable position. Heart size stable. Low lung volumes with persistent bibasilar atelectasis/infiltrates. Interim improvement in aeration from prior exam. No prominent pleural effusion. No pneumothorax. IMPRESSION: 1.  Lines and tubes stable position. 2. Low lung volumes with persistent bibasilar atelectasis/infiltrates. Interim improvement in aeration from prior exam. Electronically Signed   By: Marcello Moores  Register   On: 03/28/2020 06:14   DG CHEST PORT 1 VIEW  Result Date: 03/27/2020 CLINICAL DATA:  Respiratory failure. EXAM: PORTABLE CHEST 1 VIEW COMPARISON:  03/23/2020 FINDINGS: 0523 hours. Endotracheal tube tip has pulled back in the interval and is now approximately 6.3 cm above the base of the carina. The NG tube passes into the stomach although the distal tip position is not included on the film. Interval improvement in basilar aeration with some persistent/residual streaky opacity in the bases, likely atelectasis. No pulmonary edema or pleural effusion. The visualized bony structures of the thorax show no acute abnormality. Telemetry leads overlie the chest. IMPRESSION: 1. Interval repositioning of the endotracheal tube. 2. Interval improvement in basilar aeration with some residual probable atelectasis at the lung bases. Electronically Signed   By: Misty Stanley M.D.   On: 03/27/2020 07:25   Portable Chest x-ray  Result Date: 03/23/2020 CLINICAL DATA:  54 year old female status post intubation. EXAM: PORTABLE CHEST 1 VIEW COMPARISON:  Chest CT dated 03/20/2020. FINDINGS: Endotracheal tube with tip at the level of the carina tilting towards the right mainstem bronchus. Recommend retraction by approximately 4 cm for optimal positioning. Enteric tube extends below the diaphragm with tip beyond the inferior margin of the image.  Bilateral streaky and hazy airspace densities may represent atelectasis or pneumonia. No large pleural effusion. No pneumothorax. The cardiac silhouette is within limits. No acute osseous pathology. IMPRESSION: 1. Endotracheal tube with tip at the level of the carina tilting towards the  right mainstem bronchus. Recommend retraction by 4 cm for optimal positioning. 2. Bilateral streaky and hazy airspace densities may represent atelectasis or pneumonia. These results were called by telephone at the time of interpretation on 03/23/2020 at 12:57 am to nurse Purcell Nails, who verbally acknowledged these results. Electronically Signed   By: Anner Crete M.D.   On: 03/23/2020 01:03   DG Abd Portable 1V  Result Date: 04/12/2020 CLINICAL DATA:  Abdominal distention EXAM: PORTABLE ABDOMEN - 1 VIEW COMPARISON:  04/11/2020 FINDINGS: Gaseous distention of the colon noted. Moderate stool burden particularly in the right colon. Findings are similar to prior study. No organomegaly or free air. IMPRESSION: Moderate stool burden in the right colon with mild gaseous distention of the colon. No change. Electronically Signed   By: Rolm Baptise M.D.   On: 04/12/2020 08:40   [place] EKG SITE RITE  Result Date: 03/23/2020 If Site Rite image not attached, placement could not be confirmed due to current cardiac rhythm.    Time Spent in minutes  >30     Vickie Alvarado M.D on 04/12/2020 at 3:01 PM  To page go to [email or web address] - password [email or web address]

## 2020-04-13 ENCOUNTER — Inpatient Hospital Stay (HOSPITAL_COMMUNITY): Payer: Medicare HMO

## 2020-04-13 DIAGNOSIS — Z7189 Other specified counseling: Secondary | ICD-10-CM

## 2020-04-13 DIAGNOSIS — Z515 Encounter for palliative care: Secondary | ICD-10-CM

## 2020-04-13 LAB — COMPREHENSIVE METABOLIC PANEL
ALT: 83 U/L — ABNORMAL HIGH (ref 0–44)
AST: 23 U/L (ref 15–41)
Albumin: 2.4 g/dL — ABNORMAL LOW (ref 3.5–5.0)
Alkaline Phosphatase: 93 U/L (ref 38–126)
Anion gap: 6 (ref 5–15)
BUN: 8 mg/dL (ref 6–20)
CO2: 29 mmol/L (ref 22–32)
Calcium: 8.5 mg/dL — ABNORMAL LOW (ref 8.9–10.3)
Chloride: 102 mmol/L (ref 98–111)
Creatinine, Ser: 0.39 mg/dL — ABNORMAL LOW (ref 0.44–1.00)
GFR calc Af Amer: >60 mL/min (ref >60)
GFR calc non Af Amer: >60 mL/min (ref >60)
Glucose, Bld: 118 mg/dL — ABNORMAL HIGH (ref 70–99)
Potassium: 3.5 mmol/L (ref 3.5–5.1)
Sodium: 137 mmol/L (ref 135–145)
Total Bilirubin: 0.1 mg/dL — ABNORMAL LOW (ref 0.3–1.2)
Total Protein: 5.2 g/dL — ABNORMAL LOW (ref 6.5–8.1)

## 2020-04-13 LAB — CBC
HCT: 33.2 % — ABNORMAL LOW (ref 36.0–46.0)
Hemoglobin: 10.6 g/dL — ABNORMAL LOW (ref 12.0–15.0)
MCH: 29.5 pg (ref 26.0–34.0)
MCHC: 31.9 g/dL (ref 30.0–36.0)
MCV: 92.5 fL (ref 80.0–100.0)
Platelets: 192 10*3/uL (ref 150–400)
RBC: 3.59 MIL/uL — ABNORMAL LOW (ref 3.87–5.11)
RDW: 17.2 % — ABNORMAL HIGH (ref 11.5–15.5)
WBC: 5.9 10*3/uL (ref 4.0–10.5)
nRBC: 0 % (ref 0.0–0.2)

## 2020-04-13 LAB — GLUCOSE, CAPILLARY
Glucose-Capillary: 105 mg/dL — ABNORMAL HIGH (ref 70–99)
Glucose-Capillary: 107 mg/dL — ABNORMAL HIGH (ref 70–99)
Glucose-Capillary: 110 mg/dL — ABNORMAL HIGH (ref 70–99)
Glucose-Capillary: 135 mg/dL — ABNORMAL HIGH (ref 70–99)
Glucose-Capillary: 148 mg/dL — ABNORMAL HIGH (ref 70–99)
Glucose-Capillary: 87 mg/dL (ref 70–99)

## 2020-04-13 IMAGING — DX DG ABD PORTABLE 1V
1 series · 1 of 1 positions shown · non-contrast
Comparison: [DATE], [DATE]

CLINICAL DATA: 54-year-old female with a history of abdominal
distension

EXAM:
PORTABLE ABDOMEN - 1 VIEW

[abdomen kub]
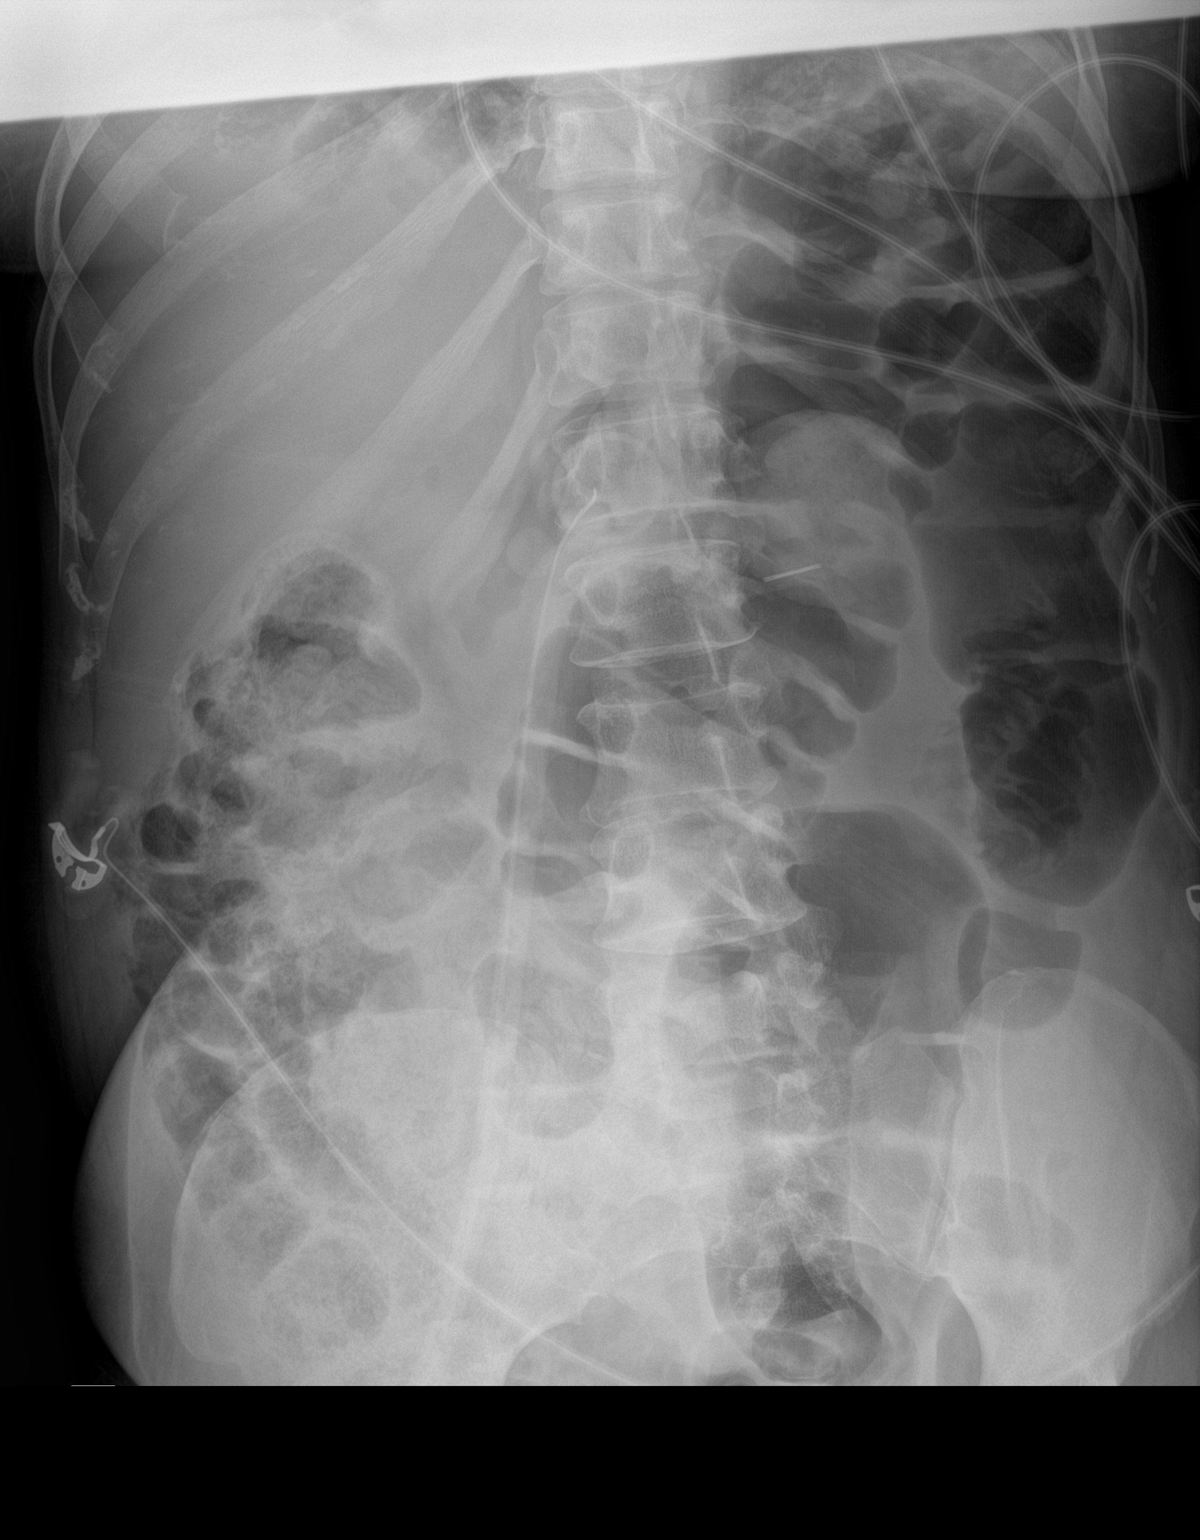

[1 of 1 positions shown; findings below may reference images not displayed]

FINDINGS: Formed stool within the right colon. Gas-filled transverse colon and
sigmoid colon extending to the rectum. No transition point. No
abnormally distended small bowel.

Gastric tube projects over the upper abdomen.

Unremarkable skeletal structures
IMPRESSION: Similar appearance of the x-ray with right-sided stool burden and
gas filled colon. No evidence of obstruction.

## 2020-04-13 NOTE — Progress Notes (Addendum)
[facility]  PROGRESS NOTE  Vickie Alvarado GHW:[id number] DOB: 06-Oct-1965 DOA: 03/20/2020 PCP: [facility] Admit date - 03/20/2020   Admitting Physician Jacky Kindle, MD  Outpatient Primary MD for the patient is [place], [facility]  LOS - 15 Brief Narrative   Vickie Alvarado is a 54 y.o. 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  She underwent stereotactic biopsy and aspiration of the abscess by neurosurgery on 03/22/2020 and was transferred to ICU.    Hospital course complicated by worsening mental status requiring MRI which showed new large left PCA infarct along with scattered infarcts in the right basal ganglia and right thalamus concerning for either CVA related to septic emboli versus herniation syndrome.  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 Given her large hemisphere brain abscess with surrounding edema/cerebritis neurosurgery did not recommend decompressive surger  Underwent  PEG placement on 9/9 with tube feeds starting at that time.  CT head shows improvement in fluid collections shows persistent left parietal/occipital acute/subacute infarcts with improvement in midline shift.  She has completed Decadron taper    Subjective  No BM for last 2 days despite enema x 2. 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  Status post stereotactic biopsy/aspiration by neurosurgery on 03/22/2020.  Improved midline shift on last CT head on 9/13.  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  Neurologic exam is stable (opens eyes to voice only).  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  Repeat abdominal x-ray shows stable amount of stool burden though distension still quite present with no concerning signs for obstruction.  No BM despite enema --Serial abdominal xr -Optimize bowel regimen disease(currently on docusate 50 mg twice daily)-- add enema, miralax BID,  and monitor output  Acute hypoxic respiratory failure, status post trach 04/01/2020, stable 5 L of O2 on trach collar - will need to be cuffless prior to discharge  HTN, stable -Continue Amlodipine 5 mg  Nutrition -Continue Prosource via feeding tube  GERD, stable -continue PPI per tube  Type 2 diabetes, poorly controlled A1c 11.5.  CBG at goal currently -Lantus 20 units daily, NovoLog  3 units  every 4 hours while on tube feeds, CBG every 4 h  -Continue IV Rocephin, end date 05/17/2020 for a total of 8 weeks as recommended by ID  Anemia of chronic disease.  Folate levels wnl adequate iron/ferritin -Continue folate supplementation, monitor hemoglobin  History of bipolar disorder, stable -Not on home Seroquel here  Pressure ulcer in gluteal cleft/sacral area,  consistent with stage 2 on my exam (no full loss of dermis, no fat exposure) difficult to promote healing given patient is incontinent of stool -Wound consulted, follow recommendations for cleaning, and foam dressing to sacrum every day    Family Communication  : will call daughter for update  Code Status : Full  Disposition Plan  :  Patient is from home. Anticipated d/c date: >3 days. Barriers to d/c or necessity for inpatient status:  Currently optimizing bowel regimen as patient has significant stool burden need to avoid obstruction in the colonic region, en, currently requiring IV ceftriaxone for abscess and repeat MRI on 9/20 per NSG Consults  : Neurosurgery, ID, PCCM, IR  Procedures  :    DVT Prophylaxis  : Heparin  Lab Results  Component Value Date   PLT 192 04/13/2020    Diet :  Diet Order            Diet NPO time specified  Diet effective midnight                  Inpatient Medications Scheduled Meds: . amLODipine  5 mg Per Tube Daily  . chlorhexidine gluconate (MEDLINE KIT)  15 mL Mouth Rinse BID  . Chlorhexidine Gluconate Cloth  6 each Topical Daily  . docusate  100 mg Per Tube BID  . feeding supplement (PROSource TF)  45 mL Per Tube TID  . folic acid  1 mg Per Tube Daily  . heparin injection (subcutaneous)  5,000 Units Subcutaneous Q8H  . insulin aspart  0-20 Units Subcutaneous Q4H  . insulin aspart  3 Units Subcutaneous Q4H  . insulin glargine  20 Units Subcutaneous Daily  . levETIRAcetam  500 mg Per Tube BID  . mouth rinse  15 mL Mouth Rinse 10 times per day  . pantoprazole sodium  40  mg Per Tube QHS  . polyethylene glycol  17 g Per Tube BID  . sodium chloride flush  10-40 mL Intracatheter Q12H   Continuous Infusions: . cefTRIAXone (ROCEPHIN)  IV 2 g (04/13/20 1131)  . dextrose 5% lactated ringers 30 mL/hr at 04/11/20 1557  . feeding supplement (JEVITY 1.5 CAL/FIBER) 1,000 mL (04/12/20 1732)   PRN Meds:.acetaminophen (TYLENOL) oral liquid 160 mg/5 mL, acetaminophen **OR** acetaminophen, fentaNYL (SUBLIMAZE) injection, hydrALAZINE, HYDROcodone-acetaminophen, labetalol, ondansetron (ZOFRAN) IV, polyethylene glycol, promethazine, sodium chloride flush  Antibiotics  :   Anti-infectives (From admission, onward)   Start     Dose/Rate Route Frequency Ordered Stop   04/04/20 1000  ceFAZolin (ANCEF) IVPB 2g/100 mL premix        2 g 200 mL/hr over 30 Minutes Intravenous To Radiology 04/04/20 0935 04/04/20 1053   04/03/20 0600  ceFAZolin (ANCEF) IVPB 1 g/50 mL premix        1 g 100 mL/hr over 30 Minutes Intravenous To Radiology 04/01/20 1239 04/04/20 0600   03/24/20 1030  vancomycin (VANCOREADY) IVPB 1500 mg/300 mL  Status:  Discontinued        1,500 mg 150 mL/hr over 120 Minutes Intravenous Every 8 hours 03/24/20 0952 03/24/20 1336   03/23/20 0130  vancomycin (  VANCOCIN) IVPB 1000 mg/200 mL premix  Status:  Discontinued        1,000 mg 200 mL/hr over 60 Minutes Intravenous Every 8 hours 03/22/20 1647 03/24/20 0952   03/22/20 2200  cefTRIAXone (ROCEPHIN) 2 g in sodium chloride 0.9 % 100 mL IVPB        2 g 200 mL/hr over 30 Minutes Intravenous Every 12 hours 03/22/20 1617     03/22/20 1730  vancomycin (VANCOREADY) IVPB 1750 mg/350 mL        1,750 mg 175 mL/hr over 120 Minutes Intravenous  Once 03/22/20 1647 03/22/20 2030   03/22/20 1630  metroNIDAZOLE (FLAGYL) IVPB 500 mg  Status:  Discontinued        500 mg 100 mL/hr over 60 Minutes Intravenous Every 8 hours 03/22/20 1618 03/24/20 1350   03/22/20 1336  bacitracin 50,000 Units in sodium chloride 0.9 % 500 mL irrigation   Status:  Discontinued          As needed 03/22/20 1336 03/22/20 1341   03/22/20 1315  vancomycin (VANCOCIN) IVPB 1000 mg/200 mL premix  Status:  Discontinued        1,000 mg 200 mL/hr over 60 Minutes Intravenous To Surgery 03/22/20 1313 03/22/20 1545   03/22/20 1315  ceFEPIme (MAXIPIME) 2 g in sodium chloride 0.9 % 100 mL IVPB        2 g 200 mL/hr over 30 Minutes Intravenous To Surgery 03/22/20 1313 03/22/20 1354   03/22/20 1145  ceFAZolin (ANCEF) IVPB 2g/100 mL premix  Status:  Discontinued        2 g 200 mL/hr over 30 Minutes Intravenous  Once 03/22/20 1131 03/22/20 1545   03/22/20 1138  ceFAZolin (ANCEF) 2-4 GM/100ML-% IVPB       Note to Pharmacy: Gleason, Ginger   : cabinet override      03/22/20 1138 03/22/20 2344       Objective   Vitals:   04/13/20 0353 04/13/20 0437 04/13/20 0838 04/13/20 1120  BP: 115/74     Pulse: 93 91 86 92  Resp: _0 Temp: 99.4 F (37.4 C)     TempSrc: Oral     SpO2: 99% 99% 100% 99%  Weight: 70.7 kg     Height:        SpO2: 99 % O2 Flow Rate (L/min): 5 L/min FiO2 (%): 28 %  Wt Readings from Last 3 Encounters:  04/13/20 70.7 kg  03/01/20 88 kg     Intake/Output Summary (Last 24 hours) at 04/13/2020 1306 Last data filed at 04/13/2020 0600 Gross per 24 hour  Intake 860 ml  Output 650 ml  Net 210 ml    Physical Exam:  Opens eyes follows voice today, not following commands, stable neurologic exam Lying comfortably, in no distress Normal respiratory effort, normal breath sounds Abdomen soft but distended, normal bowel sounds. Feeding tube in place      I have personally reviewed the following:   Data Reviewed:  CBC Recent Labs  Lab 04/07/20 0435 04/13/20 0500  WBC 5.9 5.9  HGB 10.0* 10.6*  HCT 30.4* 33.2*  PLT 156 192  MCV 92.1 92.5  MCH 30.3 29.5  MCHC 32.9 31.9  RDW 17.2* 17.2*    Chemistries  Recent Labs  Lab 04/07/20 0435 04/07/20 1444 04/08/20 0500 04/08/20 1235 04/13/20 0500  NA 135  --   --    --  137  K 3.3*  --  4.2  --  3.5  CL  104  --   --   --  102  CO2 23  --   --   --  29  GLUCOSE 168*  --   --   --  118*  BUN 8  --   --   --  8  CREATININE 0.31*  --   --   --  0.39*  CALCIUM 7.0*  --   --   --  8.5*  MG  --  1.5*  --  1.8  --   AST  --   --   --   --  23  ALT  --   --   --   --  83*  ALKPHOS  --   --   --   --  93  BILITOT  --   --   --   --  0.1*   ------------------------------------------------------------------------------------------------------------------ No results for input(s): CHOL, HDL, LDLCALC, TRIG, CHOLHDL, LDLDIRECT in the last 72 hours.  Lab Results  Component Value Date   HGBA1C 11.5 (H) 03/22/2020   ------------------------------------------------------------------------------------------------------------------ No results for input(s): TSH, T4TOTAL, T3FREE, THYROIDAB in the last 72 hours.  Invalid input(s): FREET3 ------------------------------------------------------------------------------------------------------------------ No results for input(s): VITAMINB12, FOLATE, FERRITIN, TIBC, IRON, RETICCTPCT in the last 72 hours.  Coagulation profile No results for input(s): INR, PROTIME in the last 168 hours.  No results for input(s): DDIMER in the last 72 hours.  Cardiac Enzymes No results for input(s): CKMB, TROPONINI, MYOGLOBIN in the last 168 hours.  Invalid input(s): CK ------------------------------------------------------------------------------------------------------------------ No results found for: BNP  Micro Results No results found for this or any previous visit (from the past 240 hour(s)).  Radiology Reports CT Angio Head W/Cm &/Or Wo Cm  Result Date: 03/20/2020 CLINICAL DATA:  Acute neuro deficit with right-sided weakness. EXAM: CT ANGIOGRAPHY HEAD AND NECK TECHNIQUE: Multidetector CT imaging of the head and neck was performed using the standard protocol during bolus administration of intravenous contrast. Multiplanar CT  image reconstructions and MIPs were obtained to evaluate the vascular anatomy. Carotid stenosis measurements (when applicable) are obtained utilizing NASCET criteria, using the distal internal carotid diameter as the denominator. CONTRAST:  79m OMNIPAQUE IOHEXOL 350 MG/ML SOLN COMPARISON:  None. FINDINGS: CT HEAD FINDINGS Brain: Large mass lesion in the left frontal lobe. Delayed imaging reveals irregular enhancement of the periphery with central necrosis. The enhancing component of the mass measures approximately 53 x 28 mm. Surrounding low-density edema/tumor seen around the enhancement. Low-density extends into the left basal ganglia. There is compression of the left lateral ventricle and 7 mm midline shift to the right. No acute hemorrhage. Negative for hydrocephalus Vascular: Negative for hyperdense vessel Skull: Negative Sinuses: Complete opacification right maxillary sinus with bony thickening. Mucosal edema extends into the right frontal and right ethmoid sinus. Remaining sinuses clear. Mastoid clear bilaterally. Orbits: Negative Review of the MIP images confirms the above findings CTA NECK FINDINGS Aortic arch: Normal aortic arch. Azygos branching pattern. Proximal great vessels widely patent. Right carotid system: Right carotid widely patent. Mild noncalcified plaque in the right carotid bulb. Left carotid system: Left carotid widely patent. No significant stenosis or atherosclerotic disease. Vertebral arteries: Both vertebral arteries are widely patent to the basilar without stenosis. Skeleton: Disc degeneration and spurring C5-6 and C6-7. No acute skeletal abnormality. Poor dentition. Other neck: Negative for mass or adenopathy. Upper chest: Lung apices clear bilaterally Review of the MIP images confirms the above findings CTA HEAD FINDINGS Anterior circulation: Mild atherosclerotic calcification in the cavernous carotid without stenosis.  Right middle cerebral artery widely patent. Hypoplastic right A1  segment. Azygos anterior cerebral artery arising from the left without significant stenosis. There is displacement and stretching of the left MCA branches due to the large left frontal mass. Posterior circulation: Both vertebral arteries patent to the basilar. PICA patent bilaterally. Basilar widely patent. AICA, superior cerebellar, and posterior cerebral arteries patent bilaterally without stenosis. Venous sinuses: Normal venous enhancement Anatomic variants: None Review of the MIP images confirms the above findings IMPRESSION: 1. Large mass lesion left frontal lobe compatible with tumor. Favor glioblastoma. 2. Negative for acute infarct or hemorrhage 3. No significant carotid or vertebral artery stenosis in the neck. 4. Negative for intracranial large vessel occlusion. Electronically Signed   By: Franchot Gallo M.D.   On: 03/20/2020 13:29   DG Abd 1 View  Result Date: 04/11/2020 CLINICAL DATA:  Abdominal distension. EXAM: ABDOMEN - 1 VIEW COMPARISON:  04/10/2020 FINDINGS: Gastrostomy projects over the left upper abdomen. Nonobstructive bowel gas pattern. Moderate stool in the right colon. Gas within the sigmoid colon with decreased distention since prior study. No organomegaly or free air. IMPRESSION: Continued large stool burden in the right colon. No evidence of bowel obstruction. Electronically Signed   By: Rolm Baptise M.D.   On: 04/11/2020 09:26   DG Abd 1 View  Result Date: 04/10/2020 CLINICAL DATA:  Abdominal distension EXAM: ABDOMEN - 1 VIEW COMPARISON:  CT 12/19/2019 FINDINGS: Large proximal colonic stool burden with air distention of the distal colonic segments and some mild thickening of the haustra distally. An air distended loop seen in the left lower quadrant is present with lack of air and stool over the rectal vault. Percutaneous feeding tube projects over left upper abdomen. Atelectatic changes in the lung bases. No suspicious calcifications. Mild degenerative changes in the spine and  pelvis. IMPRESSION: 1. Large proximal colonic stool burden with air distention of the distal colonic segments and some suspect thickening of the haustra distally. Air distended loop seen in the left lower quadrant may reflect a distended sigmoid with lack of air and stool over the rectal vault. Features could reflect constipation, though early distal colonic obstruction/sigmoid volvulus could present with a similar appearance. 2. Percutaneous gastrostomy in the left upper quadrant. Electronically Signed   By: Lovena Le M.D.   On: 04/10/2020 15:57   CT HEAD WO CONTRAST  Result Date: 04/08/2020 CLINICAL DATA:  Stroke. Cerebritis. Cerebral abscess. Left-sided ptosis. EXAM: CT HEAD WITHOUT CONTRAST TECHNIQUE: Contiguous axial images were obtained from the base of the skull through the vertex without intravenous contrast. COMPARISON:  MR head without and with contrast 03/24/2020. CT head without contrast 03/26/2020. FINDINGS: Brain: Previously noted fluid collections in the left operculum have significantly improved. No fluid levels or gas collections are present. Attenuation of the left lentiform nucleus is restored. Persistent hypoattenuation is present throughout the left parietal and occipital white matter. Left occipital cortical infarcts are noted. Midline shift of 2-3 mm is improved. Vascular: Atherosclerotic calcifications are present within the cavernous internal carotid arteries bilaterally. No hyperdense vessel is present. Skull: Left frontal burr hole is noted. Calvarium is otherwise intact. No significant extracranial soft tissue lesion is present. Sinuses/Orbits: Chronic right maxillary sinus specification is present. Bilateral mastoid effusions are present, left greater than right. Middle ear cavity is clear. No obstruction is present. Sinuses are otherwise clear. The globes and orbits are within normal limits. IMPRESSION: 1. Significantly improved fluid collections in the left operculum. No fluid  levels or gas collections are  present. 2. Persistent hypoattenuation throughout the left parietal and occipital white matter compatible with acute/subacute infarcts. 3. Left occipital cortical infarcts. 4. Improved midline shift of 2-3 mm. 5. Bilateral mastoid effusions, left greater than right. No obstruction is present. 6. Chronic right maxillary sinus disease. Electronically Signed   By: San Morelle M.D.   On: 04/08/2020 14:48   CT HEAD WO CONTRAST  Result Date: 03/26/2020 CLINICAL DATA:  Cerebral edema.  Status post biopsy. EXAM: CT HEAD WITHOUT CONTRAST TECHNIQUE: Contiguous axial images were obtained from the base of the skull through the vertex without intravenous contrast. COMPARISON:  None. FINDINGS: Brain: Decreased pneumocephalus at the left frontal biopsy site. The degree of edema throughout the left hemisphere has improved slightly. But, there is a new left posterior cerebral artery territory infarct. Rightward midline shift measures 6 mm at the level of the foramina of [place]. There is improved patency of the basal cisterns. No hydrocephalus. Vascular: No hyperdense vessel or unexpected calcification. Skull: Normal. Negative for fracture or focal lesion. Sinuses/Orbits: Complete opacification of the right frontal and maxillary sinuses. Normal orbits. Other: None IMPRESSION: 1. Slightly improved edema throughout the left hemisphere with 6 mm rightward midline shift. Improved patency of the basal cisterns. 2. Left posterior cerebral artery territory infarct. Electronically Signed   By: Ulyses Jarred M.D.   On: 03/26/2020 01:51   CT HEAD WO CONTRAST  Result Date: 03/22/2020 CLINICAL DATA:  Mental status change. Recent diagnosis of brain lesion post biopsy. EXAM: CT HEAD WITHOUT CONTRAST TECHNIQUE: Contiguous axial images were obtained from the base of the skull through the vertex without intravenous contrast. COMPARISON:  CT 8 hours ago.  Brain MRI earlier today. FINDINGS: Brain: Stable  size and appearance of the left brain lesion centered in the basal ganglia. Stable internal air or Gel-Foam. Stable regional mass effect. Left-to-right midline shift of 9 mm, previously 11 mm. No evidence of lesional or acute hemorrhage. Stable sulcal effacement. Lacunar infarct in the right thalamus better appreciated on prior MRI. Stable ventricular size. Basilar cisterns are patent. No developing subdural collection. Vascular: No hyperdense vessel. Skull: Left frontal burr hole.  No fracture or focal lesion. Sinuses/Orbits: Unchanged appearance of right maxillary and frontal sinusitis with opacification of anterior ethmoid air cells. Other: Post biopsy changes in the left frontal scalp. IMPRESSION: 1. Stable size and appearance of the left brain lesion centered in the basal ganglia with internal air or Gel-Foam. Stable regional mass effect. Slightly diminished left-to-right midline shift of 9 mm, previously 11 mm. 2. No evidence of hemorrhage or new abnormality. Electronically Signed   By: Keith Rake M.D.   On: 03/22/2020 23:33   CT HEAD WO CONTRAST  Result Date: 03/22/2020 CLINICAL DATA:  Follow-up brain biopsy. EXAM: CT HEAD WITHOUT CONTRAST TECHNIQUE: Contiguous axial images were obtained from the base of the skull through the vertex without intravenous contrast. COMPARISON:  MRI earlier same day.  CT yesterday. FINDINGS: Brain: Left frontal burr hole. Biopsy site with air or Gel-Foam in the region of the previous tumor with the epicenter in the left basal ganglia. No evidence of postsurgical hemorrhage. No change in regional mass effect with left-to-right shift 11 mm. No extra-axial collection. Vascular: No abnormal vascular finding. Skull: Otherwise negative Sinuses/Orbits: Ostiomeatal unit pattern of the paranasal sinuses on the right. Other: None IMPRESSION: Biopsy site with air or Gel-Foam in the region of the previous tumor with the epicenter in the left basal ganglia. No evidence of  postsurgical hemorrhage. No change in  regional mass effect with left-to-right shift 11 mm. Electronically Signed   By: Nelson Chimes M.D.   On: 03/22/2020 15:58   CT HEAD WO CONTRAST  Result Date: 03/21/2020 CLINICAL DATA:  Brain mass. EXAM: CT HEAD WITHOUT CONTRAST TECHNIQUE: Contiguous axial images were obtained from the base of the skull through the vertex without intravenous contrast. COMPARISON:  Brain MRI from yesterday FINDINGS: Brain: Known mass with central low-density and extensive hemispheric low-density and swelling centered in the deep left cerebrum. Dimensions were better assessed on postcontrast imaging from yesterday. No interval hemorrhage or herniation. Midline shift measures 1 cm without entrapment. Vascular: Negative Skull: Negative Sinuses/Orbits: Chronic right-sided sinusitis with obstruction at the level of the middle meatus. Other: Motion artifact IMPRESSION: 1. BrainLAB protocol but motion artifact at the vertex and skull base due to confusion. If not adequate for intraoperative localization a sedated scan may be required. 2. No new findings when compared to preceding brain MRI. Electronically Signed   By: Monte Fantasia M.D.   On: 03/21/2020 11:32   CT Angio Neck W and/or Wo Contrast  Result Date: 03/20/2020 CLINICAL DATA:  Acute neuro deficit with right-sided weakness. EXAM: CT ANGIOGRAPHY HEAD AND NECK TECHNIQUE: Multidetector CT imaging of the head and neck was performed using the standard protocol during bolus administration of intravenous contrast. Multiplanar CT image reconstructions and MIPs were obtained to evaluate the vascular anatomy. Carotid stenosis measurements (when applicable) are obtained utilizing NASCET criteria, using the distal internal carotid diameter as the denominator. CONTRAST:  43m OMNIPAQUE IOHEXOL 350 MG/ML SOLN COMPARISON:  None. FINDINGS: CT HEAD FINDINGS Brain: Large mass lesion in the left frontal lobe. Delayed imaging reveals irregular enhancement  of the periphery with central necrosis. The enhancing component of the mass measures approximately 53 x 28 mm. Surrounding low-density edema/tumor seen around the enhancement. Low-density extends into the left basal ganglia. There is compression of the left lateral ventricle and 7 mm midline shift to the right. No acute hemorrhage. Negative for hydrocephalus Vascular: Negative for hyperdense vessel Skull: Negative Sinuses: Complete opacification right maxillary sinus with bony thickening. Mucosal edema extends into the right frontal and right ethmoid sinus. Remaining sinuses clear. Mastoid clear bilaterally. Orbits: Negative Review of the MIP images confirms the above findings CTA NECK FINDINGS Aortic arch: Normal aortic arch. Azygos branching pattern. Proximal great vessels widely patent. Right carotid system: Right carotid widely patent. Mild noncalcified plaque in the right carotid bulb. Left carotid system: Left carotid widely patent. No significant stenosis or atherosclerotic disease. Vertebral arteries: Both vertebral arteries are widely patent to the basilar without stenosis. Skeleton: Disc degeneration and spurring C5-6 and C6-7. No acute skeletal abnormality. Poor dentition. Other neck: Negative for mass or adenopathy. Upper chest: Lung apices clear bilaterally Review of the MIP images confirms the above findings CTA HEAD FINDINGS Anterior circulation: Mild atherosclerotic calcification in the cavernous carotid without stenosis. Right middle cerebral artery widely patent. Hypoplastic right A1 segment. Azygos anterior cerebral artery arising from the left without significant stenosis. There is displacement and stretching of the left MCA branches due to the large left frontal mass. Posterior circulation: Both vertebral arteries patent to the basilar. PICA patent bilaterally. Basilar widely patent. AICA, superior cerebellar, and posterior cerebral arteries patent bilaterally without stenosis. Venous sinuses:  Normal venous enhancement Anatomic variants: None Review of the MIP images confirms the above findings IMPRESSION: 1. Large mass lesion left frontal lobe compatible with tumor. Favor glioblastoma. 2. Negative for acute infarct or hemorrhage 3. No significant carotid  or vertebral artery stenosis in the neck. 4. Negative for intracranial large vessel occlusion. Electronically Signed   By: Franchot Gallo M.D.   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 EXAM: MRI HEAD WITHOUT AND WITH CONTRAST MRA HEAD WITHOUT CONTRAST TECHNIQUE: Multiplanar, multiecho pulse sequences of the brain and surrounding structures were obtained without and with intravenous contrast. Angiographic images of the head were obtained using MRA technique without contrast. CONTRAST:  20m GADAVIST GADOBUTROL 1 MMOL/ML IV SOLN COMPARISON:  Prior MRI from 03/22/2020 and 03/20/2020. FINDINGS: MRI HEAD FINDINGS Brain: Postoperative changes from interval stereotactic aspiration of previously identified lesion centered at the left basal ganglia is seen. Aspiration tract with a small amount of susceptibility artifacts seen extending via a left frontal approach. The lesion at the left basal ganglia is slightly decreased in size now measuring 5.1 x 2.4 x 2.7 cm, previously 6.0 x 2.4 x 3.5 cm. Associated internal fluid-fluid level with restricted diffusion. Small focus of pneumocephalus at the nondependent portion of this lesion. Per history, this lesion was found to be most consistent with an intracerebral infection/abscess. Persistent surrounding vasogenic edema throughout the adjacent left cerebral hemisphere with regional mass effect and effacement of the left lateral ventricle. Edema extends into the left cerebral peduncle and left midbrain, similar to previous. Associated left-to-right shift has worsened now  measuring up to 10 mm, previously 6 mm. No overt hydrocephalus or ventricular trapping at this time. Crowding of the basilar cisterns with mass effect on the adjacent midbrain, similar to slightly worsened. No transtentorial herniation. 1 cm right thalamic infarct is similar to previous. No associated hemorrhage. Interval development of confluent restricted diffusion throughout the left temporal occipital region, left PCA distribution, consistent with acute ischemia. No associated hemorrhage. Multiple additional new patchy ischemic infarcts involving the bilateral thalami, splenium, parasagittal right temporal occipital region, and left dorsal pons. Right thalamic ischemic changes extend into the right midbrain. These infarcts involve the posterior circulation. An additional curvilinear infarct extending from the right lentiform nucleus towards the right caudate noted as well, favored to be anterior distribution (series 6, image 76). Patchy restricted diffusion also seen in the region of the left hypothalamus (series 6, image 69). Additional small cortical infarct noted at the posterior right frontotemporal region (series 6, image 80). Associated mild petechial hemorrhage at the left dorsal pons without hemorrhagic transformation. Findings favored to be embolic in nature. Mild left a meningeal enhancement noted involving the parasagittal left occipital region (series 29, image 7). While this finding could be reactive in nature related to adjacent left PCA territory infarct, a degree of underlying left a meningeal meningitis related to infection could also be considered. Pituitary stalk is thickened and enhancing as well. Vascular: Major intracranial vascular flow voids are maintained. Normal opacification seen throughout the major dural sinuses following contrast administration. Skull and upper cervical spine: Craniocervical junction within normal limits. No transtentorial herniation. Bone marrow signal intensity  normal. No scalp soft tissue abnormality. Sinuses/Orbits: Globes and orbital soft tissues within normal limits. Extensive paranasal sinus disease involving the right frontoethmoidal and right maxillary sinuses noted. Fluid seen within the nasopharynx. Small bilateral mastoid effusions. Patient is intubated. Other: None. MRA HEAD FINDINGS ANTERIOR CIRCULATION: Examination degraded by motion artifact. Visualized distal cervical segments of the internal carotid arteries are patent with symmetric antegrade flow. Petrous, cavernous, and supraclinoid ICAs patent without stenosis or other abnormality.  Left A1 patent. Right A1 hypoplastic and/or absent, accounting for the diminutive right ICA is compared to the left. Widely patent azygos ACA. No M1 stenosis or occlusion. Normal MCA bifurcations. Distal MCA branches well perfused and symmetric. POSTERIOR CIRCULATION: Vertebral arteries patent to the vertebrobasilar junction without stenosis. Right PICA patent. Left PICA not seen. Basilar mildly irregular but patent to its distal aspect without high-grade stenosis. Superior cerebral arteries patent bilaterally. Both PCAs primarily supplied via the basilar. Irregularity about the PCAs bilaterally with associated mild to moderate stenoses, greater on the left. Specific note made of a moderate left P2 stenosis (series 28, image 14) PCAs remain well perfused to their distal aspects. No aneurysm. IMPRESSION: MRI HEAD IMPRESSION: 1. Postoperative changes from stereotactic aspiration/drainage of left basal ganglia lesion without complication. Lesion is decreased in size now measuring 5.1 x 2.4 x 2.7 cm. Per history, this is consistent with and intracerebral abscess. 2. Persistent associated vasogenic edema throughout the left cerebral hemisphere with mildly worsened 10 mm left-to-right shift. 3. Interval development of multifocal predominantly posterior circulation infarcts, including a large left PCA territory infarct. Minimal  petechial hemorrhage at the left pons without hemorrhagic transformation. A central thromboembolic source is suspected. 4. Patchy leptomeningeal enhancement within the posterior left occipital region. While this may be reactive to the adjacent left PCA territory infarct, a degree of leptomeningeal meningitis related to intracerebral infection could also be considered. 5. Extensive right-sided paranasal sinus disease. MRA HEAD IMPRESSION: 1. Negative intracranial MRA for large vessel occlusion. 2. Scattered vascular irregularity involving the basilar and bilateral PCAs without occlusion. No proximal high-grade or correctable stenosis. No aneurysm. Findings discussed by called by telephone on 03/24/2020 at approximately 5:30 am with provider Dr. Rory Percy. Electronically Signed   By: Jeannine Boga M.D.   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 EXAM: MRI HEAD WITHOUT AND WITH CONTRAST MRA HEAD WITHOUT CONTRAST TECHNIQUE: Multiplanar, multiecho pulse sequences of the brain and surrounding structures were obtained without and with intravenous contrast. Angiographic images of the head were obtained using MRA technique without contrast. CONTRAST:  51m GADAVIST GADOBUTROL 1 MMOL/ML IV SOLN COMPARISON:  Prior MRI from 03/22/2020 and 03/20/2020. FINDINGS: MRI HEAD FINDINGS Brain: Postoperative changes from interval stereotactic aspiration of previously identified lesion centered at the left basal ganglia is seen. Aspiration tract with a small amount of susceptibility artifacts seen extending via a left frontal approach. The lesion at the left basal ganglia is slightly decreased in size now measuring 5.1 x 2.4 x 2.7 cm, previously 6.0 x 2.4 x 3.5 cm. Associated internal fluid-fluid level with restricted diffusion. Small focus of pneumocephalus at the nondependent portion of this  lesion. Per history, this lesion was found to be most consistent with an intracerebral infection/abscess. Persistent surrounding vasogenic edema throughout the adjacent left cerebral hemisphere with regional mass effect and effacement of the left lateral ventricle. Edema extends into the left cerebral peduncle and left midbrain, similar to previous. Associated left-to-right shift has worsened now measuring up to 10 mm, previously 6 mm. No overt hydrocephalus or ventricular trapping at this time. Crowding of the basilar cisterns with mass effect on the adjacent midbrain, similar to slightly worsened. No transtentorial herniation. 1 cm right thalamic infarct is similar to previous. No associated hemorrhage. Interval development of confluent restricted diffusion throughout the left temporal occipital region, left PCA distribution, consistent with acute ischemia. No associated  hemorrhage. Multiple additional new patchy ischemic infarcts involving the bilateral thalami, splenium, parasagittal right temporal occipital region, and left dorsal pons. Right thalamic ischemic changes extend into the right midbrain. These infarcts involve the posterior circulation. An additional curvilinear infarct extending from the right lentiform nucleus towards the right caudate noted as well, favored to be anterior distribution (series 6, image 76). Patchy restricted diffusion also seen in the region of the left hypothalamus (series 6, image 69). Additional small cortical infarct noted at the posterior right frontotemporal region (series 6, image 80). Associated mild petechial hemorrhage at the left dorsal pons without hemorrhagic transformation. Findings favored to be embolic in nature. Mild left a meningeal enhancement noted involving the parasagittal left occipital region (series 29, image 7). While this finding could be reactive in nature related to adjacent left PCA territory infarct, a degree of underlying left a meningeal meningitis  related to infection could also be considered. Pituitary stalk is thickened and enhancing as well. Vascular: Major intracranial vascular flow voids are maintained. Normal opacification seen throughout the major dural sinuses following contrast administration. Skull and upper cervical spine: Craniocervical junction within normal limits. No transtentorial herniation. Bone marrow signal intensity normal. No scalp soft tissue abnormality. Sinuses/Orbits: Globes and orbital soft tissues within normal limits. Extensive paranasal sinus disease involving the right frontoethmoidal and right maxillary sinuses noted. Fluid seen within the nasopharynx. Small bilateral mastoid effusions. Patient is intubated. Other: None. MRA HEAD FINDINGS ANTERIOR CIRCULATION: Examination degraded by motion artifact. Visualized distal cervical segments of the internal carotid arteries are patent with symmetric antegrade flow. Petrous, cavernous, and supraclinoid ICAs patent without stenosis or other abnormality. Left A1 patent. Right A1 hypoplastic and/or absent, accounting for the diminutive right ICA is compared to the left. Widely patent azygos ACA. No M1 stenosis or occlusion. Normal MCA bifurcations. Distal MCA branches well perfused and symmetric. POSTERIOR CIRCULATION: Vertebral arteries patent to the vertebrobasilar junction without stenosis. Right PICA patent. Left PICA not seen. Basilar mildly irregular but patent to its distal aspect without high-grade stenosis. Superior cerebral arteries patent bilaterally. Both PCAs primarily supplied via the basilar. Irregularity about the PCAs bilaterally with associated mild to moderate stenoses, greater on the left. Specific note made of a moderate left P2 stenosis (series 28, image 14) PCAs remain well perfused to their distal aspects. No aneurysm. IMPRESSION: MRI HEAD IMPRESSION: 1. Postoperative changes from stereotactic aspiration/drainage of left basal ganglia lesion without complication.  Lesion is decreased in size now measuring 5.1 x 2.4 x 2.7 cm. Per history, this is consistent with and intracerebral abscess. 2. Persistent associated vasogenic edema throughout the left cerebral hemisphere with mildly worsened 10 mm left-to-right shift. 3. Interval development of multifocal predominantly posterior circulation infarcts, including a large left PCA territory infarct. Minimal petechial hemorrhage at the left pons without hemorrhagic transformation. A central thromboembolic source is suspected. 4. Patchy leptomeningeal enhancement within the posterior left occipital region. While this may be reactive to the adjacent left PCA territory infarct, a degree of leptomeningeal meningitis related to intracerebral infection could also be considered. 5. Extensive right-sided paranasal sinus disease. MRA HEAD IMPRESSION: 1. Negative intracranial MRA for large vessel occlusion. 2. Scattered vascular irregularity involving the basilar and bilateral PCAs without occlusion. No proximal high-grade or correctable stenosis. No aneurysm. Findings discussed by called by telephone on 03/24/2020 at approximately 5:30 am with provider Dr. Rory Percy. Electronically Signed   By: Jeannine Boga M.D.   On: 03/24/2020 06:44   MR BRAIN W WO CONTRAST  Result Date:  03/22/2020 CLINICAL DATA:  Brain mass EXAM: MRI HEAD WITHOUT AND WITH CONTRAST TECHNIQUE: Multiplanar, multiecho pulse sequences of the brain and surrounding structures were obtained without and with intravenous contrast. CONTRAST:  74m GADAVIST GADOBUTROL 1 MMOL/ML IV SOLN COMPARISON:  03/20/2020 FINDINGS: Brain: When accounting for differences in technique (axial scan angle is slightly different), similar size of the mass centered within the left basal ganglia, with the enhancing portion measuring up to approximately 6.4 x 2.8 by 3.6 cm. Redemonstrated nodular peripheral enhancement with central necrosis. Similar exuberant surrounding T2/stir hyperintensity with  marked effacement left lateral ventricle and approximately 9 mm of rightward midline shift at the foramina MM[place] No progressive ventriculomegaly. The mass restricts diffusion peripherally without central restricted diffusion. Additionally, there is a new area of restricted diffusion within the right thalamus, compatible with acute infarct (series 550, image 28). Mild associated T2/STIR hyperintensity. Vascular: Limited evaluation; however, major vessels at the base of the brain demonstrate normal flow voids. Skull and upper cervical spine: Negative Sinuses/Orbits: There is complete opacification of the right frontal, anterior ethmoid air cells, and right maxillary sinus, compatible with ostiomeatal unit pattern sinus disease. Other: Small left mastoid effusion. IMPRESSION: 1. New/acute infarct within the right thalamus. 2. 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 Resulting effacement of the left lateral ventricle and approximately 9 mm of right midline shift, not substantially changed. No progressive ventriculomegaly. 3. Similar right-sided ostiomeatal unit pattern paranasal sinusitis. Critical Value/emergent results were called by telephone at the time of interpretation on 03/22/2020 at 10:35 am to provider Dr. DReatha Armour who verbally acknowledged these results. Electronically Signed   By: FMargaretha SheffieldMD   On: 03/22/2020 10:39   MR Brain W and Wo Contrast  Result Date: 03/20/2020 CLINICAL DATA:  Acute right-sided weakness. Brain tumor shown by CT angiography. EXAM: MRI HEAD WITHOUT AND WITH CONTRAST TECHNIQUE: Multiplanar, multiecho pulse sequences of the brain and surrounding structures were obtained without and with intravenous contrast. CONTRAST:  8.841mGADAVIST GADOBUTROL 1 MMOL/ML IV SOLN  COMPARISON:  CT studies earlier same day FINDINGS: Brain: Approximately 6.5 X 4.5 x 4 cm tumor with the epicenter in the left basal ganglia and radiating white matter tracts region. Central necrosis. Contrast enhancement along the margins of the necrosis. The enhancing region measures 6 x 2.4 x 3.5 cm. There is mass effect with flattening of the left lateral ventricle. Left-to-right midline shift of 6 mm. No ventricular trapping. No satellite lesion identified. No second brain mass. No evidence of ischemic stroke. Vascular: Major vessels at the base of the brain show flow. Skull and upper cervical spine: Negative Sinuses/Orbits: Ostiomeatal unit pattern on the right with opacification of the right maxillary, ethmoid and frontal sinuses. Orbits negative. Other: None IMPRESSION: 6.5 x 4.5 x 4 cm tumor with the epicenter in the left basal ganglia and radiating white matter tracts. Central necrosis. Contrast enhancement along the margins of the necrosis. The enhancing region measures 6 x 2.4 x 3.5 cm. There is mass effect with flattening of the left lateral ventricle. Left-to-right midline shift of 6 mm. No ventricular trapping. No satellite lesion identified. 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 Right-sided paranasal sinusitis with ostiomeatal unit pattern. Electronically Signed   By: MaNelson Chimes.D.   On: 03/20/2020 15:44   IR GASTROSTOMY TUBE MOD SED  Result Date: 04/04/2020  INDICATION: Dysphagia. Please perform percutaneous gastrostomy tube for enteric nutrition supplementation purposes. EXAM: PULL TROUGH GASTROSTOMY TUBE PLACEMENT COMPARISON:  CT abdomen and pelvis-03/20/2020 MEDICATIONS: Ancef 2 gm IV; Antibiotics were administered within 1 hour of the procedure. Glucagon 1 mg IV CONTRAST:  20 mL of Omnipaque 300 administered into the gastric lumen. ANESTHESIA/SEDATION: Moderate (conscious) sedation was employed during this procedure. A total of Versed 1 mg and Fentanyl  50 mcg was administered intravenously. Moderate Sedation Time: 20 minutes. The patient's level of consciousness and vital signs were monitored continuously by radiology nursing throughout the procedure under my direct supervision. FLUOROSCOPY TIME:  1 minutes, 12 seconds (5 mGy) COMPLICATIONS: None immediate. PROCEDURE: Informed written consent was obtained from the patient's family following explanation of the procedure, risks, benefits and alternatives. A time out was performed prior to the initiation of the procedure. Ultrasound scanning was performed to demarcate the edge of the left lobe of the liver. Maximal barrier sterile technique utilized including caps, mask, sterile gowns, sterile gloves, large sterile drape, hand hygiene and Betadine prep. The left upper quadrant was sterilely prepped and draped. An oral gastric catheter was inserted into the stomach under fluoroscopy. The existing nasogastric feeding tube was removed. The left costal margin and air opacified transverse colon were identified and avoided. Air was injected into the stomach for insufflation and visualization under fluoroscopy. Under sterile conditions a 17 gauge trocar needle was utilized to access the stomach percutaneously beneath the left subcostal margin after the overlying soft tissues were anesthetized with 1% Lidocaine with epinephrine. Needle position was confirmed within the stomach with aspiration of air and injection of small amount of contrast. A single T tack was deployed for gastropexy. Over an Amplatz guide wire, a 9-French sheath was inserted into the stomach. A snare device was utilized to capture the oral gastric catheter. The snare device was pulled retrograde from the stomach up the esophagus and out the oropharynx. The 20-French pull-through gastrostomy was connected to the snare device and pulled antegrade through the oropharynx down the esophagus into the stomach and then through the percutaneous tract external to  the patient. The gastrostomy was assembled externally. Contrast injection confirms position in the stomach. Several spot radiographic images were obtained in various obliquities for documentation. The patient tolerated procedure well without immediate post procedural complication. FINDINGS: After successful fluoroscopic guided placement, the gastrostomy tube is appropriately positioned with internal disc against the ventral aspect of the gastric lumen. IMPRESSION: Successful fluoroscopic insertion of a 20-French pull-through gastrostomy tube. The gastrostomy may be used immediately for medication administration and in 24 hrs for the initiation of feeds. Electronically Signed   By: Sandi Mariscal M.D.   On: 04/04/2020 14:48   CT CHEST ABDOMEN PELVIS W CONTRAST  Result Date: 03/20/2020 CLINICAL DATA:  54 year old female with brain mass. Evaluate for metastatic disease. EXAM: CT CHEST, ABDOMEN, AND PELVIS WITH CONTRAST TECHNIQUE: Multidetector CT imaging of the chest, abdomen and pelvis was performed following the standard protocol during bolus administration of intravenous contrast. CONTRAST:  153m OMNIPAQUE IOHEXOL 350 MG/ML SOLN COMPARISON:  None. FINDINGS: CT CHEST FINDINGS Cardiovascular: There is no cardiomegaly or pericardial effusion. There is noncalcified plaque along the thoracic aorta and aortic arch. No aneurysmal dilatation or dissection. The origins of the great vessels of the aortic arch appear patent. The central pulmonary arteries are unremarkable for the degree of opacification. Mediastinum/Nodes: Top-normal right hilar lymph nodes. No adenopathy. The esophagus and the thyroid gland are grossly unremarkable. No mediastinal fluid collection. Lungs/Pleura:  There is a patchy area of consolidation with air bronchograms involving the right lower lobe which may represent atelectasis but concerning for pneumonia. Aspiration is not excluded. Clinical correlation and follow-up to resolution recommended to  exclude an underlying mass. Faint left lung base linear and platelike atelectasis noted. There is no pleural effusion or pneumothorax. The central airways are patent. Musculoskeletal: No acute osseous pathology. CT ABDOMEN PELVIS FINDINGS No intra-abdominal free air. There is a small free fluid in the pelvis. Hepatobiliary: The liver is unremarkable. No intrahepatic biliary ductal dilatation. The gallbladder is unremarkable. Pancreas: Unremarkable. No pancreatic ductal dilatation or surrounding inflammatory changes. Spleen: Normal in size without focal abnormality. Adrenals/Urinary Tract: The adrenal glands are unremarkable. There is no hydronephrosis on either side. There is symmetric enhancement and excretion of contrast by both kidneys. The visualized ureters and urinary bladder appear unremarkable. Stomach/Bowel: There is moderate stool throughout the colon. There is no bowel obstruction or active inflammation. The appendix is normal. Vascular/Lymphatic: The abdominal aorta and IVC are unremarkable. No portal venous gas. There is no adenopathy. Reproductive: Hysterectomy. There is a 9 mm calcific focus in the right posterior pelvis which is not evaluated but may be associated with the ovary. Pelvic ultrasound may provide better evaluation. Other: Anterior pelvic wall surgical scar.  No fluid collection. Musculoskeletal: Degenerative changes of the spine. No acute osseous pathology. IMPRESSION: 1. Right lower lobe consolidation concerning for pneumonia. Aspiration is not excluded. Clinical correlation and follow-up to resolution recommended to exclude an underlying mass. 2. No evidence of metastatic disease in the chest, abdomen or pelvis. 3. An indeterminate 9 mm calcific focus in the right posterior pelvis may be associated with the ovary. Pelvic ultrasound may provide better evaluation. 4. Aortic Atherosclerosis (ICD10-I70.0). Electronically Signed   By: Anner Crete M.D.   On: 03/20/2020 19:52   DG  Chest Port 1 View  Result Date: 03/28/2020 CLINICAL DATA:  Respiratory failure.  Hypoxia.  COVID positive. EXAM: PORTABLE CHEST 1 VIEW COMPARISON:  03/27/2020. FINDINGS: Endotracheal tube and NG tube in stable position. Right PICC line stable position. Heart size stable. Low lung volumes with persistent bibasilar atelectasis/infiltrates. Interim improvement in aeration from prior exam. No prominent pleural effusion. No pneumothorax. IMPRESSION: 1.  Lines and tubes stable position. 2. Low lung volumes with persistent bibasilar atelectasis/infiltrates. Interim improvement in aeration from prior exam. Electronically Signed   By: Marcello Moores  Register   On: 03/28/2020 06:14   DG CHEST PORT 1 VIEW  Result Date: 03/27/2020 CLINICAL DATA:  Respiratory failure. EXAM: PORTABLE CHEST 1 VIEW COMPARISON:  03/23/2020 FINDINGS: 0523 hours. Endotracheal tube tip has pulled back in the interval and is now approximately 6.3 cm above the base of the carina. The NG tube passes into the stomach although the distal tip position is not included on the film. Interval improvement in basilar aeration with some persistent/residual streaky opacity in the bases, likely atelectasis. No pulmonary edema or pleural effusion. The visualized bony structures of the thorax show no acute abnormality. Telemetry leads overlie the chest. IMPRESSION: 1. Interval repositioning of the endotracheal tube. 2. Interval improvement in basilar aeration with some residual probable atelectasis at the lung bases. Electronically Signed   By: Misty Stanley M.D.   On: 03/27/2020 07:25   Portable Chest x-ray  Result Date: 03/23/2020 CLINICAL DATA:  54 year old female status post intubation. EXAM: PORTABLE CHEST 1 VIEW COMPARISON:  Chest CT dated 03/20/2020. FINDINGS: Endotracheal tube with tip at the level of the carina tilting towards the right mainstem  bronchus. Recommend retraction by approximately 4 cm for optimal positioning. Enteric tube extends below the  diaphragm with tip beyond the inferior margin of the image. Bilateral streaky and hazy airspace densities may represent atelectasis or pneumonia. No large pleural effusion. No pneumothorax. The cardiac silhouette is within limits. No acute osseous pathology. IMPRESSION: 1. Endotracheal tube with tip at the level of the carina tilting towards the right mainstem bronchus. Recommend retraction by 4 cm for optimal positioning. 2. Bilateral streaky and hazy airspace densities may represent atelectasis or pneumonia. These results were called by telephone at the time of interpretation on 03/23/2020 at 12:57 am to nurse Purcell Nails, who verbally acknowledged these results. Electronically Signed   By: Anner Crete M.D.   On: 03/23/2020 01:03   DG Abd Portable 1V  Result Date: 04/13/2020 CLINICAL DATA:  54 year old female with a history of abdominal distension EXAM: PORTABLE ABDOMEN - 1 VIEW COMPARISON:  04/12/2020, 04/11/2020 FINDINGS: Formed stool within the right colon. Gas-filled transverse colon and sigmoid colon extending to the rectum. No transition point. No abnormally distended small bowel. Gastric tube projects over the upper abdomen. Unremarkable skeletal structures IMPRESSION: Similar appearance of the x-ray with right-sided stool burden and gas filled colon. No evidence of obstruction. Electronically Signed   By: Corrie Mckusick D.O.   On: 04/13/2020 11:18   DG Abd Portable 1V  Result Date: 04/12/2020 CLINICAL DATA:  Abdominal distention EXAM: PORTABLE ABDOMEN - 1 VIEW COMPARISON:  04/11/2020 FINDINGS: Gaseous distention of the colon noted. Moderate stool burden particularly in the right colon. Findings are similar to prior study. No organomegaly or free air. IMPRESSION: Moderate stool burden in the right colon with mild gaseous distention of the colon. No change. Electronically Signed   By: Rolm Baptise M.D.   On: 04/12/2020 08:40   [place] EKG SITE RITE  Result Date: 03/23/2020 If Site Rite image not  attached, placement could not be confirmed due to current cardiac rhythm.    Time Spent in minutes  >30     Desiree Hane M.D on 04/13/2020 at 1:06 PM  To page go to [email or web address] - password [facility]

## 2020-04-13 NOTE — Consult Note (Signed)
Palliative Medicine Inpatient Consult Note  Reason for consult:  "Palliative care support for family. 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  Underwent a biopsy - then had a prolong ICU course d/t herniation syndrome. 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   Palliative care was asked to aid as support for Vickie Alvarado's family during this complicated hospitalization.  Clinical Assessment/Goals of Care: I have reviewed medical records including EPIC notes, labs and imaging, received report from bedside RN, assessed the patient who was lying in bed eyes were open, tracheostomy in place.    I met with patient daughter Vickie Alvarado on facetime to further discuss diagnosis prognosis, GOC, EOL wishes, disposition and options.   I introduced Palliative Medicine as specialized medical care for people living with serious illness. It focuses on providing relief from the symptoms and stress of a serious illness. The goal is to improve quality of life for both the patient and the family.  I asked Vickie Alvarado to tell me about her mother. She shares that her mother is from [place], [place] originally. She had five children biologically and adopted one. She recently lost one of her sons due to a murder. 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 She raised many of her own children and her brother and sisters children as well. She has the greatest amount of love for her grandchildren who bring her unsurmountable joy. She loves to go shopping and "spend money". She also loves televisions and  crossword puzzles. She likes to listen to music most notably Bronwen Betters and Tyrell Antonio.  She collects any and everything elephant related due to the message that they bring "luck" she considers them her spirit animals. Patient is spiritual and practices within the [employer].   Prior to hospitalization Vickie Alvarado was fully functional. She did not utilize any aids for mobility. She did smoke cigarettes though had not been a drinker or drug user.   Vickie Alvarado shares that everything with her mothers decline happened "so suddenly" - she describes that for the most part Vickie Alvarado has lives a very healthy life. She shares that her sibling are healthy and she does not understand why she got so sick so fast though she does think her mother having covid may have contributed to the inflammation of the abscess leading to hospitalization. Vickie Alvarado states that Vickie Alvarado's son and her grandson got murdered tragically not too far apart from one another. She expresses that it has been tough on the family as her mother is the rock of their family. I allow her time to express her shock - I utilized therapeutic listening during this time.   A detailed discussion was had today regarding advanced directives - these had never been completed by Vickie Alvarado though her eldest daughter Vickie Alvarado has been identified amongst the family as the primary Media planner.    Concepts specific to code status, artifical feeding and hydration, continued IV antibiotics and rehospitalization was had. I completed a MOST form today. The patient and family outlined their wishes for the following treatment decisions:  Cardiopulmonary Resuscitation: Attempt Resuscitation (CPR)  Medical Interventions:  Full Scope of Treatment: Use intubation, advanced airway interventions, mechanical ventilation, cardioversion as indicated, medical treatment, IV fluids, etc, also provide comfort measures. Transfer to the hospital if indicated  Antibiotics: Antibiotics if indicated  IV  Fluids: IV fluids if indicated  Feeding Tube: Feeding tube long-term if indicated   I shared with Vickie Alvarado that we are here to offer a an additional layer of support for she and her family as they navigate this trial some time with their mother.   Discussed the importance of continued conversation with family and their  medical providers regarding overall plan of care and treatment options, ensuring decisions are within the context of the patients values and GOCs.  Decision Maker: Vickie Alvarado (Daughter) [phone]  SUMMARY OF RECOMMENDATIONS   Full Code / Full Scope of Care - If full efforts are made and medical team does not think patients condition will improve - family would not want to continue prolonged care if patient were projected to be in a vegetative state  MOST Completed, paper copy placed onto the chart electric copy can be found in [place] Planning: FULL CODE   Palliative Prophylaxis:   Oral Care, Turn Q2H  Additional Recommendations (Limitations, Scope, Preferences):  Full scope of treatment   Psycho-social/Spiritual:   Desire for further Chaplaincy support: Yes  Additional Recommendations: Education on serious illness   Prognosis: Unclear  Discharge Planning: Skilled level of care that can accommodate respiratory support  PPS: 10%   This conversation/these recommendations were discussed with patient primary care team, Dr. Lonny Prude  Time In: 1436 Time Out: 1600 Total Time: 84 Greater than 50%  of this time was spent counseling and coordinating care related to the above assessment and plan.  [place] Team Team Cell Phone: [phone] Please utilize secure chat with additional questions, if there is no response within 30 minutes please call the above phone number  Palliative Medicine Team providers are available by phone from 7am to 7pm daily and can be reached  through the team cell phone.  Should this patient require assistance outside of these hours, please call the patient's attending physician.

## 2020-04-14 ENCOUNTER — Inpatient Hospital Stay (HOSPITAL_COMMUNITY): Payer: Medicare HMO

## 2020-04-14 LAB — GLUCOSE, CAPILLARY
Glucose-Capillary: 109 mg/dL — ABNORMAL HIGH (ref 70–99)
Glucose-Capillary: 115 mg/dL — ABNORMAL HIGH (ref 70–99)
Glucose-Capillary: 91 mg/dL (ref 70–99)
Glucose-Capillary: 148 mg/dL — ABNORMAL HIGH (ref 70–99)
Glucose-Capillary: 160 mg/dL — ABNORMAL HIGH (ref 70–99)
Glucose-Capillary: 113 mg/dL — ABNORMAL HIGH (ref 70–99)
Glucose-Capillary: 111 mg/dL — ABNORMAL HIGH (ref 70–99)

## 2020-04-14 IMAGING — DX DG ABD PORTABLE 1V
1 series · 1 of 1 positions shown · non-contrast
Comparison: [DATE]

CLINICAL DATA: Evaluate for stool within the right colon.

EXAM:
PORTABLE ABDOMEN - 1 VIEW

[abdomen kub]
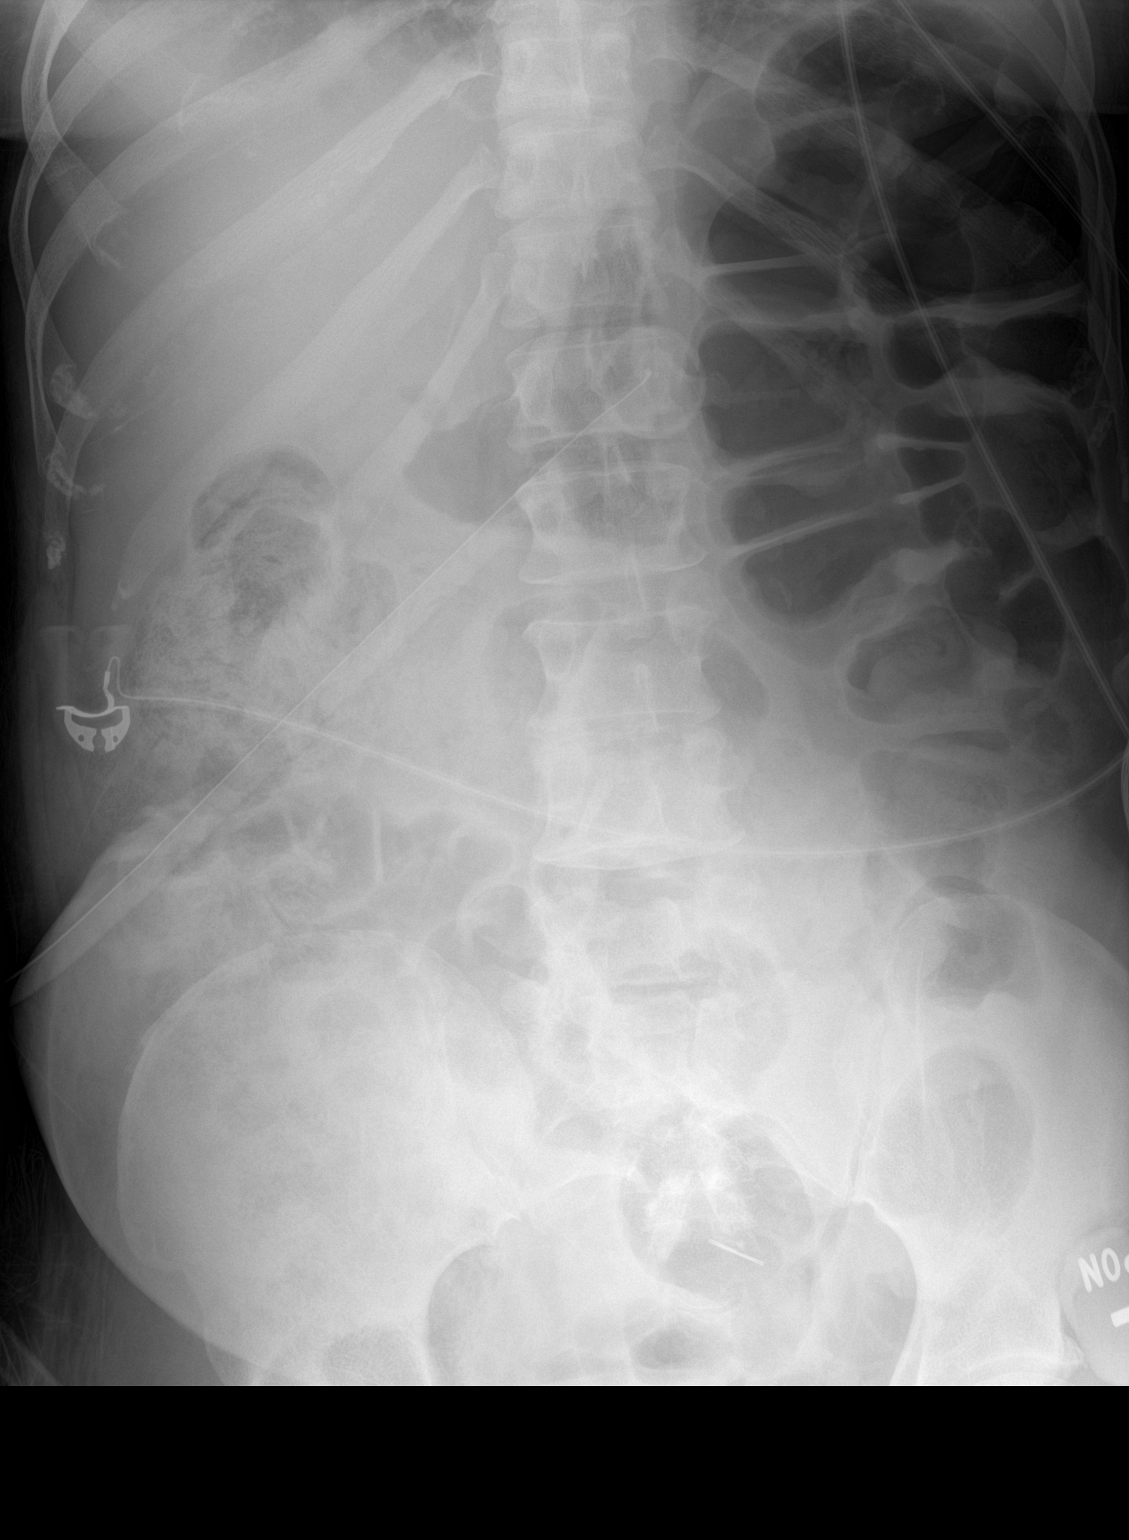

[1 of 1 positions shown; findings below may reference images not displayed]

FINDINGS: Gastrostomy tube is again noted projecting over the left upper
abdomen. Similar volume of stool noted within the right colon.
Gaseous distension of the transverse colon and splenic flexure is
again identified with intraluminal gas up to the level of the
rectum.
IMPRESSION: 1. Similar volume of stool within the right colon.
2. Persistent gaseous distension of the transverse colon and splenic
flexure.

## 2020-04-14 MED ORDER — FLUCONAZOLE 150 MG PO TABS
150.0000 mg | ORAL_TABLET | Freq: Once | ORAL | Status: AC
  Administered 2020-04-14: 150 mg
  Filled 2020-04-14: qty 1

## 2020-04-14 MED ORDER — SORBITOL 70 % SOLN
960.0000 mL | TOPICAL_OIL | Freq: Once | ORAL | Status: DC
  Filled 2020-04-14: qty 473

## 2020-04-14 NOTE — Progress Notes (Signed)
   04/14/20 1450  Clinical Encounter Type  Visited With Patient  Visit Type Initial  Referral From Nurse  Consult/Referral To Chaplain  Received request for spiritual support. Visited room 2x's patient was asleep.This note was prepared by Deneen Harts, M.Div..  For questions please contact by phone [phone].

## 2020-04-14 NOTE — Progress Notes (Signed)
[facility]  PROGRESS NOTE  Vickie Alvarado ZOX:[id number] DOB: [birth year] DOA: 03/20/2020 PCP: [facility] Admit date - 03/20/2020   Admitting Physician Jacky Kindle, MD  Outpatient Primary MD for the patient is [place], [facility]  LOS - 25 Brief Narrative   Vickie Alvarado is a 54 y.o. 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  She underwent stereotactic biopsy and aspiration of the abscess by neurosurgery on 03/22/2020 and was transferred to ICU.    Hospital course complicated by worsening mental status requiring MRI which showed new large left PCA infarct along with scattered infarcts in the right basal ganglia and right thalamus concerning for either CVA related to septic emboli versus herniation syndrome.  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 Given her large hemisphere brain abscess with surrounding edema/cerebritis neurosurgery did not recommend decompressive surger  Underwent  PEG placement on 9/9 with tube feeds starting at that time.  CT head shows improvement in fluid collections shows persistent left parietal/occipital acute/subacute infarcts with improvement in midline shift.  She has completed Decadron taper    Subjective  Palliative care team providing support to family on 9/18.  No acute events overnight.  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  Status post stereotactic biopsy/aspiration by neurosurgery on 03/22/2020.  Improved midline shift on last CT head on 9/13.  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  Neurologic exam is stable (opens eyes to voice only).  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  Repeat abdominal x-ray shows stable amount of stool burden particularly in right colon, so far has not been very responsive to enema, with no signs of obstruction -Trial smog enema, continue scheduled MiraLAX, senna docusate, monitor output -Monitor Serial abdominal xr  Acute hypoxic respiratory failure, status post trach 04/01/2020, stable 5 L of O2 on trach collar - will need to be cuffless prior to discharge  HTN, stable -Continue Amlodipine 5 mg  Nutrition -Continue Prosource via feeding tube  GERD, stable -continue PPI per tube  Type 2 diabetes, poorly controlled A1c 11.5.  CBG at goal currently -Lantus 20 units daily, NovoLog 3 units every 4  hours while on tube feeds, CBG every 4 h  -Continue IV Rocephin, end date 05/17/2020 for a total of 8 weeks as recommended by ID  Anemia of chronic disease.  Folate levels wnl adequate iron/ferritin -Continue folate supplementation, monitor hemoglobin  History of bipolar disorder, stable -Not on home Seroquel here  Pressure ulcer in gluteal cleft/sacral area,  consistent with stage 2 on my exam (no full loss of dermis, no fat exposure) difficult to promote healing given patient is incontinent of stool -Wound consulted, follow recommendations for cleaning, and foam dressing to sacrum every day    Family Communication  : updated daughter Vickie Alvarado on phone on 9/18, will update today aswell  Code Status : Full  Disposition Plan  :  Patient is from home. Anticipated d/c date: >3 days. Barriers to d/c or necessity for inpatient status:  Currently optimizing bowel regimen as patient has significant stool burden need to avoid obstruction in the colonic region, en, currently requiring IV ceftriaxone for abscess and repeat MRI on 9/20 per NSG Consults  : Neurosurgery, ID, PCCM, IR  Procedures  :    DVT Prophylaxis  : Heparin  Lab Results  Component Value Date   PLT 192 04/13/2020    Diet :  Diet Order            Diet NPO time specified  Diet effective midnight                  Inpatient Medications Scheduled Meds: . amLODipine  5 mg Per Tube Daily  . chlorhexidine gluconate (MEDLINE KIT)  15 mL Mouth Rinse BID  . Chlorhexidine Gluconate Cloth  6 each Topical Daily  . docusate  100 mg Per Tube BID  . feeding supplement (PROSource TF)  45 mL Per Tube TID  . folic acid  1 mg Per Tube Daily  . heparin injection (subcutaneous)  5,000 Units Subcutaneous Q8H  . insulin aspart  0-20 Units Subcutaneous Q4H  . insulin aspart  3 Units Subcutaneous Q4H  . insulin glargine  20 Units Subcutaneous Daily  . levETIRAcetam  500 mg Per Tube BID  . mouth rinse  15 mL Mouth Rinse 10 times per day   . pantoprazole sodium  40 mg Per Tube QHS  . polyethylene glycol  17 g Per Tube BID  . sodium chloride flush  10-40 mL Intracatheter Q12H   Continuous Infusions: . cefTRIAXone (ROCEPHIN)  IV 2 g (04/14/20 0856)  . dextrose 5% lactated ringers 30 mL/hr at 04/14/20 0617  . feeding supplement (JEVITY 1.5 CAL/FIBER) 1,000 mL (04/13/20 1655)   PRN Meds:.acetaminophen (TYLENOL) oral liquid 160 mg/5 mL, acetaminophen **OR** acetaminophen, fentaNYL (SUBLIMAZE) injection, hydrALAZINE, HYDROcodone-acetaminophen, labetalol, ondansetron (ZOFRAN) IV, polyethylene glycol, promethazine, sodium chloride flush  Antibiotics  :   Anti-infectives (From admission, onward)   Start     Dose/Rate Route Frequency Ordered Stop   04/04/20 1000  ceFAZolin (ANCEF) IVPB 2g/100 mL premix        2 g 200 mL/hr over 30 Minutes Intravenous To Radiology 04/04/20 0935 04/04/20 1053   04/03/20 0600  ceFAZolin (ANCEF) IVPB 1 g/50 mL premix        1 g 100 mL/hr over 30 Minutes Intravenous To Radiology 04/01/20 1239 04/04/20 0600   03/24/20 1030  vancomycin (VANCOREADY) IVPB 1500 mg/300 mL  Status:  Discontinued        1,500 mg 150 mL/hr over 120 Minutes Intravenous Every 8 hours 03/24/20 0952 03/24/20 1336   03/23/20 0130  vancomycin (VANCOCIN) IVPB 1000 mg/200 mL premix  Status:  Discontinued        1,000 mg 200 mL/hr over 60 Minutes Intravenous Every 8 hours 03/22/20 1647 03/24/20 0952   03/22/20 2200  cefTRIAXone (ROCEPHIN) 2 g in sodium chloride 0.9 % 100 mL IVPB        2 g 200 mL/hr over 30 Minutes Intravenous Every 12 hours 03/22/20 1617     03/22/20 1730  vancomycin (VANCOREADY) IVPB 1750 mg/350 mL        1,750 mg 175 mL/hr over 120 Minutes Intravenous  Once 03/22/20 1647 03/22/20 2030   03/22/20 1630  metroNIDAZOLE (FLAGYL) IVPB 500 mg  Status:  Discontinued        500 mg 100 mL/hr over 60 Minutes Intravenous Every 8 hours 03/22/20 1618 03/24/20 1350   03/22/20 1336  bacitracin 50,000 Units in sodium chloride  0.9 % 500 mL irrigation  Status:  Discontinued          As needed 03/22/20 1336 03/22/20 1341   03/22/20 1315  vancomycin (VANCOCIN) IVPB 1000 mg/200 mL premix  Status:  Discontinued        1,000 mg 200 mL/hr over 60 Minutes Intravenous To Surgery 03/22/20 1313 03/22/20 1545   03/22/20 1315  ceFEPIme (MAXIPIME) 2 g in sodium chloride 0.9 % 100 mL IVPB        2 g 200 mL/hr over 30 Minutes Intravenous To Surgery 03/22/20 1313 03/22/20 1354   03/22/20 1145  ceFAZolin (ANCEF) IVPB 2g/100 mL premix  Status:  Discontinued        2 g 200 mL/hr over 30 Minutes Intravenous  Once 03/22/20 1131 03/22/20 1545   03/22/20 1138  ceFAZolin (ANCEF) 2-4 GM/100ML-% IVPB       Note to Pharmacy: Gleason, Ginger   : cabinet override      03/22/20 1138 03/22/20 2344       Objective   Vitals:   04/14/20 0410 04/14/20 0608 04/14/20 0744 04/14/20 0803  BP: 111/64 (!) 113/59    Pulse: 90  96   Resp: 18  17   Temp:  98.8 F (37.1 C)  98.4 F (36.9 C)  TempSrc:  Axillary    SpO2: 100%  100%   Weight:      Height:        SpO2: 100 % O2 Flow Rate (L/min): 5 L/min FiO2 (%): 28 %  Wt Readings from Last 3 Encounters:  04/14/20 70.9 kg  03/01/20 88 kg     Intake/Output Summary (Last 24 hours) at 04/14/2020 1108 Last data filed at 04/14/2020 0404 Gross per 24 hour  Intake --  Output 900 ml  Net -900 ml    Physical Exam:  Opens eyes follows voice today, not following commands, stable neurologic exam Lying comfortably, in no distress Normal respiratory effort, normal breath sounds Abdomen soft but distended, normal bowel sounds. Feeding tube in place      I have personally reviewed the following:   Data Reviewed:  CBC Recent Labs  Lab 04/13/20 0500  WBC 5.9  HGB 10.6*  HCT 33.2*  PLT 192  MCV 92.5  MCH 29.5  MCHC 31.9  RDW 17.2*    Chemistries  Recent Labs  Lab 04/07/20 1444 04/08/20 0500 04/08/20 1235 04/13/20 0500  NA  --   --   --  137  K  --  4.2  --  3.5  CL  --    --   --  102  CO2  --   --   --  29  GLUCOSE  --   --   --  118*  BUN  --   --   --  8  CREATININE  --   --   --  0.39*  CALCIUM  --   --   --  8.5*  MG 1.5*  --  1.8  --   AST  --   --   --  23  ALT  --   --   --  83*  ALKPHOS  --   --   --  93  BILITOT  --   --   --  0.1*   ------------------------------------------------------------------------------------------------------------------ No results for input(s): CHOL, HDL, LDLCALC, TRIG, CHOLHDL, LDLDIRECT in the last 72 hours.  Lab Results  Component Value Date   HGBA1C 11.5 (H) 03/22/2020   ------------------------------------------------------------------------------------------------------------------ No results for input(s): TSH, T4TOTAL, T3FREE, THYROIDAB in the last 72 hours.  Invalid input(s): FREET3 ------------------------------------------------------------------------------------------------------------------ No results for input(s): VITAMINB12, FOLATE, FERRITIN, TIBC, IRON, RETICCTPCT in the last 72 hours.  Coagulation profile No results for input(s): INR, PROTIME in the last 168 hours.  No results for input(s): DDIMER in the last 72 hours.  Cardiac Enzymes No results for input(s): CKMB, TROPONINI, MYOGLOBIN in the last 168 hours.  Invalid input(s): CK ------------------------------------------------------------------------------------------------------------------ No results found for: BNP  Micro Results No results found for this or any previous visit (from the past 240 hour(s)).  Radiology Reports CT Angio Head W/Cm &/Or Wo Cm  Result Date: 03/20/2020 CLINICAL DATA:  Acute neuro deficit with right-sided weakness. EXAM: CT ANGIOGRAPHY HEAD AND NECK TECHNIQUE: Multidetector CT imaging of the head and neck was performed using the standard protocol during bolus administration of intravenous contrast. Multiplanar CT image reconstructions and MIPs were obtained to evaluate the vascular anatomy. Carotid stenosis  measurements (when applicable) are obtained utilizing NASCET criteria, using the distal internal carotid diameter as the denominator. CONTRAST:  3m OMNIPAQUE IOHEXOL 350 MG/ML SOLN COMPARISON:  None. FINDINGS: CT HEAD FINDINGS Brain: Large mass lesion in the left frontal lobe. Delayed imaging reveals irregular enhancement of the periphery with central necrosis. The enhancing component of the mass measures approximately 53 x 28 mm. Surrounding low-density edema/tumor seen around the enhancement. Low-density extends into the left basal ganglia. There is compression of the left lateral ventricle and 7 mm midline shift to the right. No acute hemorrhage. Negative for hydrocephalus Vascular: Negative for hyperdense vessel Skull: Negative Sinuses: Complete opacification right maxillary sinus with bony thickening. Mucosal edema extends into the right frontal and right ethmoid sinus. Remaining sinuses clear. Mastoid clear bilaterally. Orbits: Negative Review of the MIP images confirms the above findings CTA NECK FINDINGS Aortic arch: Normal aortic arch. Azygos branching pattern. Proximal great vessels widely patent. Right carotid system: Right carotid widely patent. Mild noncalcified plaque in the right carotid bulb. Left carotid system: Left carotid widely patent. No significant stenosis or atherosclerotic disease. Vertebral arteries: Both vertebral arteries are widely patent to the basilar without stenosis. Skeleton: Disc degeneration and spurring C5-6 and C6-7. No acute skeletal abnormality. Poor dentition. Other neck: Negative for mass or adenopathy. Upper chest: Lung apices clear bilaterally Review of the MIP images confirms the above findings CTA HEAD FINDINGS Anterior circulation: Mild atherosclerotic calcification in the cavernous carotid without stenosis. Right middle cerebral artery widely patent. Hypoplastic right A1 segment. Azygos anterior cerebral artery arising from the left without significant stenosis.  There is displacement and stretching of the left MCA branches  due to the large left frontal mass. Posterior circulation: Both vertebral arteries patent to the basilar. PICA patent bilaterally. Basilar widely patent. AICA, superior cerebellar, and posterior cerebral arteries patent bilaterally without stenosis. Venous sinuses: Normal venous enhancement Anatomic variants: None Review of the MIP images confirms the above findings IMPRESSION: 1. Large mass lesion left frontal lobe compatible with tumor. Favor glioblastoma. 2. Negative for acute infarct or hemorrhage 3. No significant carotid or vertebral artery stenosis in the neck. 4. Negative for intracranial large vessel occlusion. Electronically Signed   By: Franchot Gallo M.D.   On: 03/20/2020 13:29   DG Abd 1 View  Result Date: 04/11/2020 CLINICAL DATA:  Abdominal distension. EXAM: ABDOMEN - 1 VIEW COMPARISON:  04/10/2020 FINDINGS: Gastrostomy projects over the left upper abdomen. Nonobstructive bowel gas pattern. Moderate stool in the right colon. Gas within the sigmoid colon with decreased distention since prior study. No organomegaly or free air. IMPRESSION: Continued large stool burden in the right colon. No evidence of bowel obstruction. Electronically Signed   By: Rolm Baptise M.D.   On: 04/11/2020 09:26   DG Abd 1 View  Result Date: 04/10/2020 CLINICAL DATA:  Abdominal distension EXAM: ABDOMEN - 1 VIEW COMPARISON:  CT 12/19/2019 FINDINGS: Large proximal colonic stool burden with air distention of the distal colonic segments and some mild thickening of the haustra distally. An air distended loop seen in the left lower quadrant is present with lack of air and stool over the rectal vault. Percutaneous feeding tube projects over left upper abdomen. Atelectatic changes in the lung bases. No suspicious calcifications. Mild degenerative changes in the spine and pelvis. IMPRESSION: 1. Large proximal colonic stool burden with air distention of the distal  colonic segments and some suspect thickening of the haustra distally. Air distended loop seen in the left lower quadrant may reflect a distended sigmoid with lack of air and stool over the rectal vault. Features could reflect constipation, though early distal colonic obstruction/sigmoid volvulus could present with a similar appearance. 2. Percutaneous gastrostomy in the left upper quadrant. Electronically Signed   By: Lovena Le M.D.   On: 04/10/2020 15:57   CT HEAD WO CONTRAST  Result Date: 04/08/2020 CLINICAL DATA:  Stroke. Cerebritis. Cerebral abscess. Left-sided ptosis. EXAM: CT HEAD WITHOUT CONTRAST TECHNIQUE: Contiguous axial images were obtained from the base of the skull through the vertex without intravenous contrast. COMPARISON:  MR head without and with contrast 03/24/2020. CT head without contrast 03/26/2020. FINDINGS: Brain: Previously noted fluid collections in the left operculum have significantly improved. No fluid levels or gas collections are present. Attenuation of the left lentiform nucleus is restored. Persistent hypoattenuation is present throughout the left parietal and occipital white matter. Left occipital cortical infarcts are noted. Midline shift of 2-3 mm is improved. Vascular: Atherosclerotic calcifications are present within the cavernous internal carotid arteries bilaterally. No hyperdense vessel is present. Skull: Left frontal burr hole is noted. Calvarium is otherwise intact. No significant extracranial soft tissue lesion is present. Sinuses/Orbits: Chronic right maxillary sinus specification is present. Bilateral mastoid effusions are present, left greater than right. Middle ear cavity is clear. No obstruction is present. Sinuses are otherwise clear. The globes and orbits are within normal limits. IMPRESSION: 1. Significantly improved fluid collections in the left operculum. No fluid levels or gas collections are present. 2. Persistent hypoattenuation throughout the left  parietal and occipital white matter compatible with acute/subacute infarcts. 3. Left occipital cortical infarcts. 4. Improved midline shift of 2-3 mm. 5. Bilateral mastoid effusions,  left greater than right. No obstruction is present. 6. Chronic right maxillary sinus disease. Electronically Signed   By: San Morelle M.D.   On: 04/08/2020 14:48   CT HEAD WO CONTRAST  Result Date: 03/26/2020 CLINICAL DATA:  Cerebral edema.  Status post biopsy. EXAM: CT HEAD WITHOUT CONTRAST TECHNIQUE: Contiguous axial images were obtained from the base of the skull through the vertex without intravenous contrast. COMPARISON:  None. FINDINGS: Brain: Decreased pneumocephalus at the left frontal biopsy site. The degree of edema throughout the left hemisphere has improved slightly. But, there is a new left posterior cerebral artery territory infarct. Rightward midline shift measures 6 mm at the level of the foramina of [place]. There is improved patency of the basal cisterns. No hydrocephalus. Vascular: No hyperdense vessel or unexpected calcification. Skull: Normal. Negative for fracture or focal lesion. Sinuses/Orbits: Complete opacification of the right frontal and maxillary sinuses. Normal orbits. Other: None IMPRESSION: 1. Slightly improved edema throughout the left hemisphere with 6 mm rightward midline shift. Improved patency of the basal cisterns. 2. Left posterior cerebral artery territory infarct. Electronically Signed   By: Ulyses Jarred M.D.   On: 03/26/2020 01:51   CT HEAD WO CONTRAST  Result Date: 03/22/2020 CLINICAL DATA:  Mental status change. Recent diagnosis of brain lesion post biopsy. EXAM: CT HEAD WITHOUT CONTRAST TECHNIQUE: Contiguous axial images were obtained from the base of the skull through the vertex without intravenous contrast. COMPARISON:  CT 8 hours ago.  Brain MRI earlier today. FINDINGS: Brain: Stable size and appearance of the left brain lesion centered in the basal ganglia. Stable internal  air or Gel-Foam. Stable regional mass effect. Left-to-right midline shift of 9 mm, previously 11 mm. No evidence of lesional or acute hemorrhage. Stable sulcal effacement. Lacunar infarct in the right thalamus better appreciated on prior MRI. Stable ventricular size. Basilar cisterns are patent. No developing subdural collection. Vascular: No hyperdense vessel. Skull: Left frontal burr hole.  No fracture or focal lesion. Sinuses/Orbits: Unchanged appearance of right maxillary and frontal sinusitis with opacification of anterior ethmoid air cells. Other: Post biopsy changes in the left frontal scalp. IMPRESSION: 1. Stable size and appearance of the left brain lesion centered in the basal ganglia with internal air or Gel-Foam. Stable regional mass effect. Slightly diminished left-to-right midline shift of 9 mm, previously 11 mm. 2. No evidence of hemorrhage or new abnormality. Electronically Signed   By: Keith Rake M.D.   On: 03/22/2020 23:33   CT HEAD WO CONTRAST  Result Date: 03/22/2020 CLINICAL DATA:  Follow-up brain biopsy. EXAM: CT HEAD WITHOUT CONTRAST TECHNIQUE: Contiguous axial images were obtained from the base of the skull through the vertex without intravenous contrast. COMPARISON:  MRI earlier same day.  CT yesterday. FINDINGS: Brain: Left frontal burr hole. Biopsy site with air or Gel-Foam in the region of the previous tumor with the epicenter in the left basal ganglia. No evidence of postsurgical hemorrhage. No change in regional mass effect with left-to-right shift 11 mm. No extra-axial collection. Vascular: No abnormal vascular finding. Skull: Otherwise negative Sinuses/Orbits: Ostiomeatal unit pattern of the paranasal sinuses on the right. Other: None IMPRESSION: Biopsy site with air or Gel-Foam in the region of the previous tumor with the epicenter in the left basal ganglia. No evidence of postsurgical hemorrhage. No change in regional mass effect with left-to-right shift 11 mm.  Electronically Signed   By: Nelson Chimes M.D.   On: 03/22/2020 15:58   CT HEAD WO CONTRAST  Result Date: 03/21/2020  CLINICAL DATA:  Brain mass. EXAM: CT HEAD WITHOUT CONTRAST TECHNIQUE: Contiguous axial images were obtained from the base of the skull through the vertex without intravenous contrast. COMPARISON:  Brain MRI from yesterday FINDINGS: Brain: Known mass with central low-density and extensive hemispheric low-density and swelling centered in the deep left cerebrum. Dimensions were better assessed on postcontrast imaging from yesterday. No interval hemorrhage or herniation. Midline shift measures 1 cm without entrapment. Vascular: Negative Skull: Negative Sinuses/Orbits: Chronic right-sided sinusitis with obstruction at the level of the middle meatus. Other: Motion artifact IMPRESSION: 1. BrainLAB protocol but motion artifact at the vertex and skull base due to confusion. If not adequate for intraoperative localization a sedated scan may be required. 2. No new findings when compared to preceding brain MRI. Electronically Signed   By: Monte Fantasia M.D.   On: 03/21/2020 11:32   CT Angio Neck W and/or Wo Contrast  Result Date: 03/20/2020 CLINICAL DATA:  Acute neuro deficit with right-sided weakness. EXAM: CT ANGIOGRAPHY HEAD AND NECK TECHNIQUE: Multidetector CT imaging of the head and neck was performed using the standard protocol during bolus administration of intravenous contrast. Multiplanar CT image reconstructions and MIPs were obtained to evaluate the vascular anatomy. Carotid stenosis measurements (when applicable) are obtained utilizing NASCET criteria, using the distal internal carotid diameter as the denominator. CONTRAST:  56m OMNIPAQUE IOHEXOL 350 MG/ML SOLN COMPARISON:  None. FINDINGS: CT HEAD FINDINGS Brain: Large mass lesion in the left frontal lobe. Delayed imaging reveals irregular enhancement of the periphery with central necrosis. The enhancing component of the mass measures  approximately 53 x 28 mm. Surrounding low-density edema/tumor seen around the enhancement. Low-density extends into the left basal ganglia. There is compression of the left lateral ventricle and 7 mm midline shift to the right. No acute hemorrhage. Negative for hydrocephalus Vascular: Negative for hyperdense vessel Skull: Negative Sinuses: Complete opacification right maxillary sinus with bony thickening. Mucosal edema extends into the right frontal and right ethmoid sinus. Remaining sinuses clear. Mastoid clear bilaterally. Orbits: Negative Review of the MIP images confirms the above findings CTA NECK FINDINGS Aortic arch: Normal aortic arch. Azygos branching pattern. Proximal great vessels widely patent. Right carotid system: Right carotid widely patent. Mild noncalcified plaque in the right carotid bulb. Left carotid system: Left carotid widely patent. No significant stenosis or atherosclerotic disease. Vertebral arteries: Both vertebral arteries are widely patent to the basilar without stenosis. Skeleton: Disc degeneration and spurring C5-6 and C6-7. No acute skeletal abnormality. Poor dentition. Other neck: Negative for mass or adenopathy. Upper chest: Lung apices clear bilaterally Review of the MIP images confirms the above findings CTA HEAD FINDINGS Anterior circulation: Mild atherosclerotic calcification in the cavernous carotid without stenosis. Right middle cerebral artery widely patent. Hypoplastic right A1 segment. Azygos anterior cerebral artery arising from the left without significant stenosis. There is displacement and stretching of the left MCA branches due to the large left frontal mass. Posterior circulation: Both vertebral arteries patent to the basilar. PICA patent bilaterally. Basilar widely patent. AICA, superior cerebellar, and posterior cerebral arteries patent bilaterally without stenosis. Venous sinuses: Normal venous enhancement Anatomic variants: None Review of the MIP images confirms  the above findings IMPRESSION: 1. Large mass lesion left frontal lobe compatible with tumor. Favor glioblastoma. 2. Negative for acute infarct or hemorrhage 3. No significant carotid or vertebral artery stenosis in the neck. 4. Negative for intracranial large vessel occlusion. Electronically Signed   By: CFranchot GalloM.D.   On: 03/20/2020 13:29   MR ANGIO  HEAD WO CONTRAST  Result Date: 03/24/2020 CLINICAL DATA:  54 year old female with history of brain abscess, status post stereotactic aspiration/drainage. EXAM: MRI HEAD WITHOUT AND WITH CONTRAST MRA HEAD WITHOUT CONTRAST TECHNIQUE: Multiplanar, multiecho pulse sequences of the brain and surrounding structures were obtained without and with intravenous contrast. Angiographic images of the head were obtained using MRA technique without contrast. CONTRAST:  78m GADAVIST GADOBUTROL 1 MMOL/ML IV SOLN COMPARISON:  Prior MRI from 03/22/2020 and 03/20/2020. FINDINGS: MRI HEAD FINDINGS Brain: Postoperative changes from interval stereotactic aspiration of previously identified lesion centered at the left basal ganglia is seen. Aspiration tract with a small amount of susceptibility artifacts seen extending via a left frontal approach. The lesion at the left basal ganglia is slightly decreased in size now measuring 5.1 x 2.4 x 2.7 cm, previously 6.0 x 2.4 x 3.5 cm. Associated internal fluid-fluid level with restricted diffusion. Small focus of pneumocephalus at the nondependent portion of this lesion. Per history, this lesion was found to be most consistent with an intracerebral infection/abscess. Persistent surrounding vasogenic edema throughout the adjacent left cerebral hemisphere with regional mass effect and effacement of the left lateral ventricle. Edema extends into the left cerebral peduncle and left midbrain, similar to previous. Associated left-to-right shift has worsened now measuring up to 10 mm, previously 6 mm. No overt hydrocephalus or ventricular trapping  at this time. Crowding of the basilar cisterns with mass effect on the adjacent midbrain, similar to slightly worsened. No transtentorial herniation. 1 cm right thalamic infarct is similar to previous. No associated hemorrhage. Interval development of confluent restricted diffusion throughout the left temporal occipital region, left PCA distribution, consistent with acute ischemia. No associated hemorrhage. Multiple additional new patchy ischemic infarcts involving the bilateral thalami, splenium, parasagittal right temporal occipital region, and left dorsal pons. Right thalamic ischemic changes extend into the right midbrain. These infarcts involve the posterior circulation. An additional curvilinear infarct extending from the right lentiform nucleus towards the right caudate noted as well, favored to be anterior distribution (series 6, image 76). Patchy restricted diffusion also seen in the region of the left hypothalamus (series 6, image 69). Additional small cortical infarct noted at the posterior right frontotemporal region (series 6, image 80). Associated mild petechial hemorrhage at the left dorsal pons without hemorrhagic transformation. Findings favored to be embolic in nature. Mild left a meningeal enhancement noted involving the parasagittal left occipital region (series 29, image 7). While this finding could be reactive in nature related to adjacent left PCA territory infarct, a degree of underlying left a meningeal meningitis related to infection could also be considered. Pituitary stalk is thickened and enhancing as well. Vascular: Major intracranial vascular flow voids are maintained. Normal opacification seen throughout the major dural sinuses following contrast administration. Skull and upper cervical spine: Craniocervical junction within normal limits. No transtentorial herniation. Bone marrow signal intensity normal. No scalp soft tissue abnormality. Sinuses/Orbits: Globes and orbital soft tissues  within normal limits. Extensive paranasal sinus disease involving the right frontoethmoidal and right maxillary sinuses noted. Fluid seen within the nasopharynx. Small bilateral mastoid effusions. Patient is intubated. Other: None. MRA HEAD FINDINGS ANTERIOR CIRCULATION: Examination degraded by motion artifact. Visualized distal cervical segments of the internal carotid arteries are patent with symmetric antegrade flow. Petrous, cavernous, and supraclinoid ICAs patent without stenosis or other abnormality. Left A1 patent. Right A1 hypoplastic and/or absent, accounting for the diminutive right ICA is compared to the left. Widely patent azygos ACA. No M1 stenosis or occlusion. Normal MCA bifurcations. Distal  MCA branches well perfused and symmetric. POSTERIOR CIRCULATION: Vertebral arteries patent to the vertebrobasilar junction without stenosis. Right PICA patent. Left PICA not seen. Basilar mildly irregular but patent to its distal aspect without high-grade stenosis. Superior cerebral arteries patent bilaterally. Both PCAs primarily supplied via the basilar. Irregularity about the PCAs bilaterally with associated mild to moderate stenoses, greater on the left. Specific note made of a moderate left P2 stenosis (series 28, image 14) PCAs remain well perfused to their distal aspects. No aneurysm. IMPRESSION: MRI HEAD IMPRESSION: 1. Postoperative changes from stereotactic aspiration/drainage of left basal ganglia lesion without complication. Lesion is decreased in size now measuring 5.1 x 2.4 x 2.7 cm. Per history, this is consistent with and intracerebral abscess. 2. Persistent associated vasogenic edema throughout the left cerebral hemisphere with mildly worsened 10 mm left-to-right shift. 3. Interval development of multifocal predominantly posterior circulation infarcts, including a large left PCA territory infarct. Minimal petechial hemorrhage at the left pons without hemorrhagic transformation. A central  thromboembolic source is suspected. 4. Patchy leptomeningeal enhancement within the posterior left occipital region. While this may be reactive to the adjacent left PCA territory infarct, a degree of leptomeningeal meningitis related to intracerebral infection could also be considered. 5. Extensive right-sided paranasal sinus disease. MRA HEAD IMPRESSION: 1. Negative intracranial MRA for large vessel occlusion. 2. Scattered vascular irregularity involving the basilar and bilateral PCAs without occlusion. No proximal high-grade or correctable stenosis. No aneurysm. Findings discussed by called by telephone on 03/24/2020 at approximately 5:30 am with provider Dr. Rory Percy. Electronically Signed   By: Jeannine Boga M.D.   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 EXAM: MRI HEAD WITHOUT AND WITH CONTRAST MRA HEAD WITHOUT CONTRAST TECHNIQUE: Multiplanar, multiecho pulse sequences of the brain and surrounding structures were obtained without and with intravenous contrast. Angiographic images of the head were obtained using MRA technique without contrast. CONTRAST:  10m GADAVIST GADOBUTROL 1 MMOL/ML IV SOLN COMPARISON:  Prior MRI from 03/22/2020 and 03/20/2020. FINDINGS: MRI HEAD FINDINGS Brain: Postoperative changes from interval stereotactic aspiration of previously identified lesion centered at the left basal ganglia is seen. Aspiration tract with a small amount of susceptibility artifacts seen extending via a left frontal approach. The lesion at the left basal ganglia is slightly decreased in size now measuring 5.1 x 2.4 x 2.7 cm, previously 6.0 x 2.4 x 3.5 cm. Associated internal fluid-fluid level with restricted diffusion. Small focus of pneumocephalus at the nondependent portion of this lesion. Per history, this lesion was found to be most consistent with an  intracerebral infection/abscess. Persistent surrounding vasogenic edema throughout the adjacent left cerebral hemisphere with regional mass effect and effacement of the left lateral ventricle. Edema extends into the left cerebral peduncle and left midbrain, similar to previous. Associated left-to-right shift has worsened now measuring up to 10 mm, previously 6 mm. No overt hydrocephalus or ventricular trapping at this time. Crowding of the basilar cisterns with mass effect on the adjacent midbrain, similar to slightly worsened. No transtentorial herniation. 1 cm right thalamic infarct is similar to previous. No associated hemorrhage. Interval development of confluent restricted diffusion throughout the left temporal occipital region, left PCA distribution, consistent with acute ischemia. No associated hemorrhage. Multiple additional new patchy ischemic infarcts involving the bilateral thalami, splenium, parasagittal right temporal occipital region, and left dorsal pons. Right thalamic ischemic changes extend into the right midbrain. These infarcts  involve the posterior circulation. An additional curvilinear infarct extending from the right lentiform nucleus towards the right caudate noted as well, favored to be anterior distribution (series 6, image 76). Patchy restricted diffusion also seen in the region of the left hypothalamus (series 6, image 69). Additional small cortical infarct noted at the posterior right frontotemporal region (series 6, image 80). Associated mild petechial hemorrhage at the left dorsal pons without hemorrhagic transformation. Findings favored to be embolic in nature. Mild left a meningeal enhancement noted involving the parasagittal left occipital region (series 29, image 7). While this finding could be reactive in nature related to adjacent left PCA territory infarct, a degree of underlying left a meningeal meningitis related to infection could also be considered. Pituitary stalk is  thickened and enhancing as well. Vascular: Major intracranial vascular flow voids are maintained. Normal opacification seen throughout the major dural sinuses following contrast administration. Skull and upper cervical spine: Craniocervical junction within normal limits. No transtentorial herniation. Bone marrow signal intensity normal. No scalp soft tissue abnormality. Sinuses/Orbits: Globes and orbital soft tissues within normal limits. Extensive paranasal sinus disease involving the right frontoethmoidal and right maxillary sinuses noted. Fluid seen within the nasopharynx. Small bilateral mastoid effusions. Patient is intubated. Other: None. MRA HEAD FINDINGS ANTERIOR CIRCULATION: Examination degraded by motion artifact. Visualized distal cervical segments of the internal carotid arteries are patent with symmetric antegrade flow. Petrous, cavernous, and supraclinoid ICAs patent without stenosis or other abnormality. Left A1 patent. Right A1 hypoplastic and/or absent, accounting for the diminutive right ICA is compared to the left. Widely patent azygos ACA. No M1 stenosis or occlusion. Normal MCA bifurcations. Distal MCA branches well perfused and symmetric. POSTERIOR CIRCULATION: Vertebral arteries patent to the vertebrobasilar junction without stenosis. Right PICA patent. Left PICA not seen. Basilar mildly irregular but patent to its distal aspect without high-grade stenosis. Superior cerebral arteries patent bilaterally. Both PCAs primarily supplied via the basilar. Irregularity about the PCAs bilaterally with associated mild to moderate stenoses, greater on the left. Specific note made of a moderate left P2 stenosis (series 28, image 14) PCAs remain well perfused to their distal aspects. No aneurysm. IMPRESSION: MRI HEAD IMPRESSION: 1. Postoperative changes from stereotactic aspiration/drainage of left basal ganglia lesion without complication. Lesion is decreased in size now measuring 5.1 x 2.4 x 2.7 cm. Per  history, this is consistent with and intracerebral abscess. 2. Persistent associated vasogenic edema throughout the left cerebral hemisphere with mildly worsened 10 mm left-to-right shift. 3. Interval development of multifocal predominantly posterior circulation infarcts, including a large left PCA territory infarct. Minimal petechial hemorrhage at the left pons without hemorrhagic transformation. A central thromboembolic source is suspected. 4. Patchy leptomeningeal enhancement within the posterior left occipital region. While this may be reactive to the adjacent left PCA territory infarct, a degree of leptomeningeal meningitis related to intracerebral infection could also be considered. 5. Extensive right-sided paranasal sinus disease. MRA HEAD IMPRESSION: 1. Negative intracranial MRA for large vessel occlusion. 2. Scattered vascular irregularity involving the basilar and bilateral PCAs without occlusion. No proximal high-grade or correctable stenosis. No aneurysm. Findings discussed by called by telephone on 03/24/2020 at approximately 5:30 am with provider Dr. Rory Percy. Electronically Signed   By: Jeannine Boga M.D.   On: 03/24/2020 06:44   MR BRAIN W WO CONTRAST  Result Date: 03/22/2020 CLINICAL DATA:  Brain mass EXAM: MRI HEAD WITHOUT AND WITH CONTRAST TECHNIQUE: Multiplanar, multiecho pulse sequences of the brain and surrounding structures were obtained without and with intravenous contrast. CONTRAST:  54mL GADAVIST GADOBUTROL 1 MMOL/ML IV SOLN COMPARISON:  03/20/2020 FINDINGS: Brain: When accounting for differences in technique (axial scan angle is slightly different), similar size of the mass centered within the left basal ganglia, with the enhancing portion measuring up to approximately 6.4 x 2.8 by 3.6 cm. Redemonstrated nodular peripheral enhancement with central necrosis. Similar exuberant surrounding T2/stir hyperintensity with marked effacement left lateral ventricle and approximately 9 mm of  rightward midline shift at the foramina [place]. No progressive ventriculomegaly. The mass restricts diffusion peripherally without central restricted diffusion. Additionally, there is a new area of restricted diffusion within the right thalamus, compatible with acute infarct (series 550, image 28). Mild associated T2/STIR hyperintensity. Vascular: Limited evaluation; however, major vessels at the base of the brain demonstrate normal flow voids. Skull and upper cervical spine: Negative Sinuses/Orbits: There is complete opacification of the right frontal, anterior ethmoid air cells, and right maxillary sinus, compatible with ostiomeatal unit pattern sinus disease. Other: Small left mastoid effusion. IMPRESSION: 1. New/acute infarct within the right thalamus. 2. 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 Resulting effacement of the left lateral ventricle and approximately 9 mm of right midline shift, not substantially changed. No progressive ventriculomegaly. 3. Similar right-sided ostiomeatal unit pattern paranasal sinusitis. Critical Value/emergent results were called by telephone at the time of interpretation on 03/22/2020 at 10:35 am to provider Dr. Jake Samples, who verbally acknowledged these results. Electronically Signed   By: Feliberto Harts MD   On: 03/22/2020 10:39   MR Brain W and Wo Contrast  Result Date: 03/20/2020 CLINICAL DATA:  Acute right-sided weakness. Brain tumor shown by CT angiography. EXAM: MRI HEAD WITHOUT AND WITH CONTRAST TECHNIQUE: Multiplanar, multiecho pulse sequences of the brain and surrounding structures were obtained without and with intravenous contrast. CONTRAST:  8.54mL GADAVIST GADOBUTROL 1 MMOL/ML IV SOLN COMPARISON:  CT studies earlier same day FINDINGS: Brain: Approximately  6.5 X 4.5 x 4 cm tumor with the epicenter in the left basal ganglia and radiating white matter tracts region. Central necrosis. Contrast enhancement along the margins of the necrosis. The enhancing region measures 6 x 2.4 x 3.5 cm. There is mass effect with flattening of the left lateral ventricle. Left-to-right midline shift of 6 mm. No ventricular trapping. No satellite lesion identified. No second brain mass. No evidence of ischemic stroke. Vascular: Major vessels at the base of the brain show flow. Skull and upper cervical spine: Negative Sinuses/Orbits: Ostiomeatal unit pattern on the right with opacification of the right maxillary, ethmoid and frontal sinuses. Orbits negative. Other: None IMPRESSION: 6.5 x 4.5 x 4 cm tumor with the epicenter in the left basal ganglia and radiating white matter tracts. Central necrosis. Contrast enhancement along the margins of the necrosis. The enhancing region measures 6 x 2.4 x 3.5 cm. There is mass effect with flattening of the left lateral ventricle. Left-to-right midline shift of 6 mm. No ventricular trapping. No satellite lesion identified. 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 Right-sided paranasal sinusitis with ostiomeatal unit pattern. Electronically Signed   By: Paulina Fusi M.D.   On: 03/20/2020 15:44   IR GASTROSTOMY TUBE MOD SED  Result Date: 04/04/2020 INDICATION: Dysphagia. Please perform percutaneous gastrostomy tube for enteric nutrition supplementation purposes. EXAM: PULL TROUGH GASTROSTOMY TUBE PLACEMENT COMPARISON:  CT abdomen and pelvis-03/20/2020 MEDICATIONS: Ancef 2 gm IV; Antibiotics were administered within  1 hour of the procedure. Glucagon 1 mg IV CONTRAST:  20 mL of Omnipaque 300 administered into the gastric lumen. ANESTHESIA/SEDATION: Moderate (conscious) sedation was employed during this procedure. A total of Versed 1 mg and Fentanyl 50 mcg was administered intravenously. Moderate Sedation Time: 20  minutes. The patient's level of consciousness and vital signs were monitored continuously by radiology nursing throughout the procedure under my direct supervision. FLUOROSCOPY TIME:  1 minutes, 12 seconds (5 mGy) COMPLICATIONS: None immediate. PROCEDURE: Informed written consent was obtained from the patient's family following explanation of the procedure, risks, benefits and alternatives. A time out was performed prior to the initiation of the procedure. Ultrasound scanning was performed to demarcate the edge of the left lobe of the liver. Maximal barrier sterile technique utilized including caps, mask, sterile gowns, sterile gloves, large sterile drape, hand hygiene and Betadine prep. The left upper quadrant was sterilely prepped and draped. An oral gastric catheter was inserted into the stomach under fluoroscopy. The existing nasogastric feeding tube was removed. The left costal margin and air opacified transverse colon were identified and avoided. Air was injected into the stomach for insufflation and visualization under fluoroscopy. Under sterile conditions a 17 gauge trocar needle was utilized to access the stomach percutaneously beneath the left subcostal margin after the overlying soft tissues were anesthetized with 1% Lidocaine with epinephrine. Needle position was confirmed within the stomach with aspiration of air and injection of small amount of contrast. A single T tack was deployed for gastropexy. Over an Amplatz guide wire, a 9-French sheath was inserted into the stomach. A snare device was utilized to capture the oral gastric catheter. The snare device was pulled retrograde from the stomach up the esophagus and out the oropharynx. The 20-French pull-through gastrostomy was connected to the snare device and pulled antegrade through the oropharynx down the esophagus into the stomach and then through the percutaneous tract external to the patient. The gastrostomy was assembled externally. Contrast  injection confirms position in the stomach. Several spot radiographic images were obtained in various obliquities for documentation. The patient tolerated procedure well without immediate post procedural complication. FINDINGS: After successful fluoroscopic guided placement, the gastrostomy tube is appropriately positioned with internal disc against the ventral aspect of the gastric lumen. IMPRESSION: Successful fluoroscopic insertion of a 20-French pull-through gastrostomy tube. The gastrostomy may be used immediately for medication administration and in 24 hrs for the initiation of feeds. Electronically Signed   By: Sandi Mariscal M.D.   On: 04/04/2020 14:48   CT CHEST ABDOMEN PELVIS W CONTRAST  Result Date: 03/20/2020 CLINICAL DATA:  54 year old female with brain mass. Evaluate for metastatic disease. EXAM: CT CHEST, ABDOMEN, AND PELVIS WITH CONTRAST TECHNIQUE: Multidetector CT imaging of the chest, abdomen and pelvis was performed following the standard protocol during bolus administration of intravenous contrast. CONTRAST:  17m OMNIPAQUE IOHEXOL 350 MG/ML SOLN COMPARISON:  None. FINDINGS: CT CHEST FINDINGS Cardiovascular: There is no cardiomegaly or pericardial effusion. There is noncalcified plaque along the thoracic aorta and aortic arch. No aneurysmal dilatation or dissection. The origins of the great vessels of the aortic arch appear patent. The central pulmonary arteries are unremarkable for the degree of opacification. Mediastinum/Nodes: Top-normal right hilar lymph nodes. No adenopathy. The esophagus and the thyroid gland are grossly unremarkable. No mediastinal fluid collection. Lungs/Pleura: There is a patchy area of consolidation with air bronchograms involving the right lower lobe which may represent atelectasis but concerning for pneumonia. Aspiration is not excluded. Clinical correlation and follow-up to resolution  recommended to exclude an underlying mass. Faint left lung base linear and  platelike atelectasis noted. There is no pleural effusion or pneumothorax. The central airways are patent. Musculoskeletal: No acute osseous pathology. CT ABDOMEN PELVIS FINDINGS No intra-abdominal free air. There is a small free fluid in the pelvis. Hepatobiliary: The liver is unremarkable. No intrahepatic biliary ductal dilatation. The gallbladder is unremarkable. Pancreas: Unremarkable. No pancreatic ductal dilatation or surrounding inflammatory changes. Spleen: Normal in size without focal abnormality. Adrenals/Urinary Tract: The adrenal glands are unremarkable. There is no hydronephrosis on either side. There is symmetric enhancement and excretion of contrast by both kidneys. The visualized ureters and urinary bladder appear unremarkable. Stomach/Bowel: There is moderate stool throughout the colon. There is no bowel obstruction or active inflammation. The appendix is normal. Vascular/Lymphatic: The abdominal aorta and IVC are unremarkable. No portal venous gas. There is no adenopathy. Reproductive: Hysterectomy. There is a 9 mm calcific focus in the right posterior pelvis which is not evaluated but may be associated with the ovary. Pelvic ultrasound may provide better evaluation. Other: Anterior pelvic wall surgical scar.  No fluid collection. Musculoskeletal: Degenerative changes of the spine. No acute osseous pathology. IMPRESSION: 1. Right lower lobe consolidation concerning for pneumonia. Aspiration is not excluded. Clinical correlation and follow-up to resolution recommended to exclude an underlying mass. 2. No evidence of metastatic disease in the chest, abdomen or pelvis. 3. An indeterminate 9 mm calcific focus in the right posterior pelvis may be associated with the ovary. Pelvic ultrasound may provide better evaluation. 4. Aortic Atherosclerosis (ICD10-I70.0). Electronically Signed   By: Anner Crete M.D.   On: 03/20/2020 19:52   DG Chest Port 1 View  Result Date: 03/28/2020 CLINICAL DATA:   Respiratory failure.  Hypoxia.  COVID positive. EXAM: PORTABLE CHEST 1 VIEW COMPARISON:  03/27/2020. FINDINGS: Endotracheal tube and NG tube in stable position. Right PICC line stable position. Heart size stable. Low lung volumes with persistent bibasilar atelectasis/infiltrates. Interim improvement in aeration from prior exam. No prominent pleural effusion. No pneumothorax. IMPRESSION: 1.  Lines and tubes stable position. 2. Low lung volumes with persistent bibasilar atelectasis/infiltrates. Interim improvement in aeration from prior exam. Electronically Signed   By: Marcello Moores  Register   On: 03/28/2020 06:14   DG CHEST PORT 1 VIEW  Result Date: 03/27/2020 CLINICAL DATA:  Respiratory failure. EXAM: PORTABLE CHEST 1 VIEW COMPARISON:  03/23/2020 FINDINGS: 0523 hours. Endotracheal tube tip has pulled back in the interval and is now approximately 6.3 cm above the base of the carina. The NG tube passes into the stomach although the distal tip position is not included on the film. Interval improvement in basilar aeration with some persistent/residual streaky opacity in the bases, likely atelectasis. No pulmonary edema or pleural effusion. The visualized bony structures of the thorax show no acute abnormality. Telemetry leads overlie the chest. IMPRESSION: 1. Interval repositioning of the endotracheal tube. 2. Interval improvement in basilar aeration with some residual probable atelectasis at the lung bases. Electronically Signed   By: Misty Stanley M.D.   On: 03/27/2020 07:25   Portable Chest x-ray  Result Date: 03/23/2020 CLINICAL DATA:  54 year old female status post intubation. EXAM: PORTABLE CHEST 1 VIEW COMPARISON:  Chest CT dated 03/20/2020. FINDINGS: Endotracheal tube with tip at the level of the carina tilting towards the right mainstem bronchus. Recommend retraction by approximately 4 cm for optimal positioning. Enteric tube extends below the diaphragm with tip beyond the inferior margin of the image.  Bilateral streaky and hazy airspace densities may  represent atelectasis or pneumonia. No large pleural effusion. No pneumothorax. The cardiac silhouette is within limits. No acute osseous pathology. IMPRESSION: 1. Endotracheal tube with tip at the level of the carina tilting towards the right mainstem bronchus. Recommend retraction by 4 cm for optimal positioning. 2. Bilateral streaky and hazy airspace densities may represent atelectasis or pneumonia. These results were called by telephone at the time of interpretation on 03/23/2020 at 12:57 am to nurse Purcell Nails, who verbally acknowledged these results. Electronically Signed   By: Anner Crete M.D.   On: 03/23/2020 01:03   DG Abd Portable 1V  Result Date: 04/14/2020 CLINICAL DATA:  Evaluate for stool within the right colon. EXAM: PORTABLE ABDOMEN - 1 VIEW COMPARISON:  04/13/2020 FINDINGS: Gastrostomy tube is again noted projecting over the left upper abdomen. Similar volume of stool noted within the right colon. Gaseous distension of the transverse colon and splenic flexure is again identified with intraluminal gas up to the level of the rectum. IMPRESSION: 1. Similar volume of stool within the right colon. 2. Persistent gaseous distension of the transverse colon and splenic flexure. Electronically Signed   By: Kerby Moors M.D.   On: 04/14/2020 08:44   DG Abd Portable 1V  Result Date: 04/13/2020 CLINICAL DATA:  54 year old female with a history of abdominal distension EXAM: PORTABLE ABDOMEN - 1 VIEW COMPARISON:  04/12/2020, 04/11/2020 FINDINGS: Formed stool within the right colon. Gas-filled transverse colon and sigmoid colon extending to the rectum. No transition point. No abnormally distended small bowel. Gastric tube projects over the upper abdomen. Unremarkable skeletal structures IMPRESSION: Similar appearance of the x-ray with right-sided stool burden and gas filled colon. No evidence of obstruction. Electronically Signed   By: Corrie Mckusick  D.O.   On: 04/13/2020 11:18   DG Abd Portable 1V  Result Date: 04/12/2020 CLINICAL DATA:  Abdominal distention EXAM: PORTABLE ABDOMEN - 1 VIEW COMPARISON:  04/11/2020 FINDINGS: Gaseous distention of the colon noted. Moderate stool burden particularly in the right colon. Findings are similar to prior study. No organomegaly or free air. IMPRESSION: Moderate stool burden in the right colon with mild gaseous distention of the colon. No change. Electronically Signed   By: Rolm Baptise M.D.   On: 04/12/2020 08:40   [place] EKG SITE RITE  Result Date: 03/23/2020 If Site Rite image not attached, placement could not be confirmed due to current cardiac rhythm.    Time Spent in minutes  >30     Desiree Hane M.D on 04/14/2020 at 11:08 AM  To page go to [email or web address] - password [email or web address]

## 2020-04-15 ENCOUNTER — Inpatient Hospital Stay (HOSPITAL_COMMUNITY): Payer: Medicare HMO

## 2020-04-15 LAB — GLUCOSE, CAPILLARY
Glucose-Capillary: 105 mg/dL — ABNORMAL HIGH (ref 70–99)
Glucose-Capillary: 109 mg/dL — ABNORMAL HIGH (ref 70–99)
Glucose-Capillary: 117 mg/dL — ABNORMAL HIGH (ref 70–99)
Glucose-Capillary: 118 mg/dL — ABNORMAL HIGH (ref 70–99)
Glucose-Capillary: 122 mg/dL — ABNORMAL HIGH (ref 70–99)
Glucose-Capillary: 144 mg/dL — ABNORMAL HIGH (ref 70–99)

## 2020-04-15 IMAGING — MR MR HEAD WO/W CM
5 of 14 series · 13 of 48 positions shown · IV contrast (gadavist)
Comparison: [DATE] MRI/MRA head and prior.  [DATE] head CT.

CLINICAL DATA: Brain abscess

EXAM:
MRI HEAD WITHOUT AND WITH CONTRAST
TECHNIQUE: Multiplanar, multiecho pulse sequences of the brain and surrounding
structures were obtained without and with intravenous contrast.
CONTRAST:  7mL GADAVIST GADOBUTROL 1 MMOL/ML IV SOLN

[Series 2: DWI · axial · 3.0mm · 0.94mm/px · z∈[-147,-7]mm · 6 of 100 slices shown (1 of 2)]
[im 1/100]
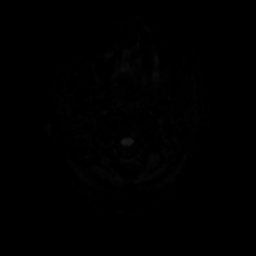
[im 20/100]
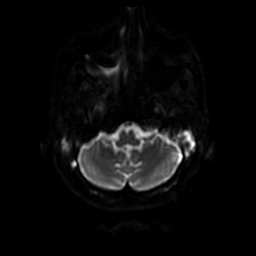
[im 40/100]
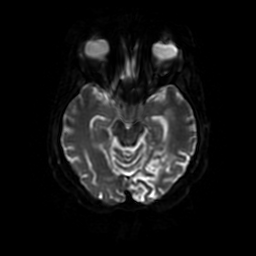
[im 60/100]
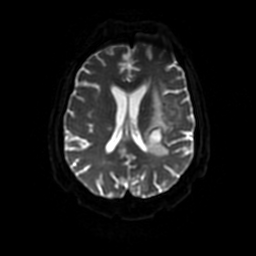
[im 80/100]
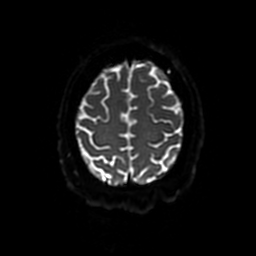
[im 100/100]
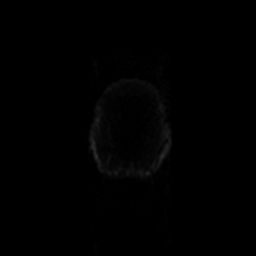

[Series 3: DWI · coronal · 4.0mm · 0.94mm/px · 1 of 74 slices shown (2 of 2)]
[im 1/74]
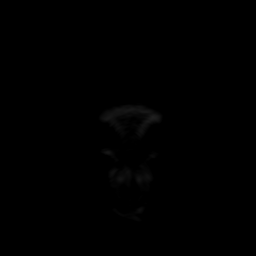

[Series 4: FLAIR · sagittal · 5.0mm · 0.23mm/px · 2 of 23 slices shown (1 of 2)]
[im 1/23]
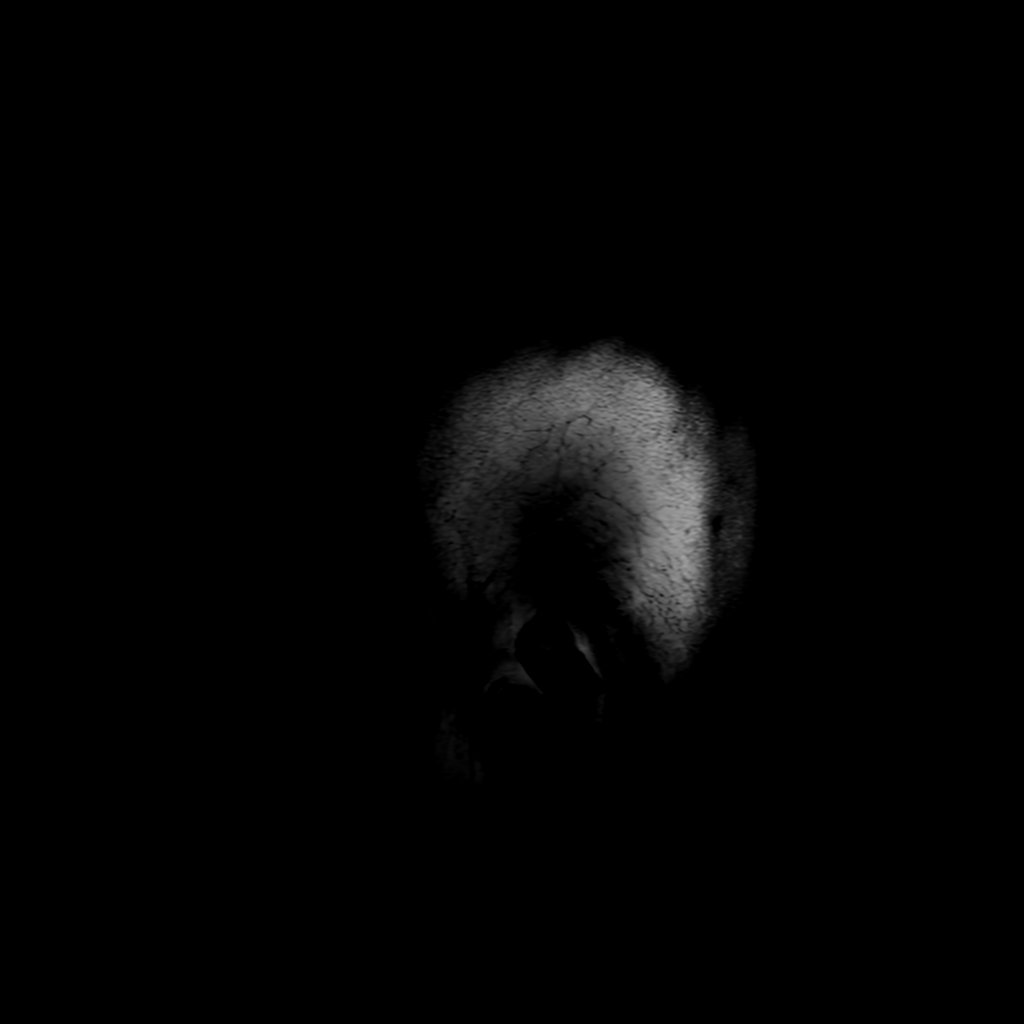
[im 23/23]
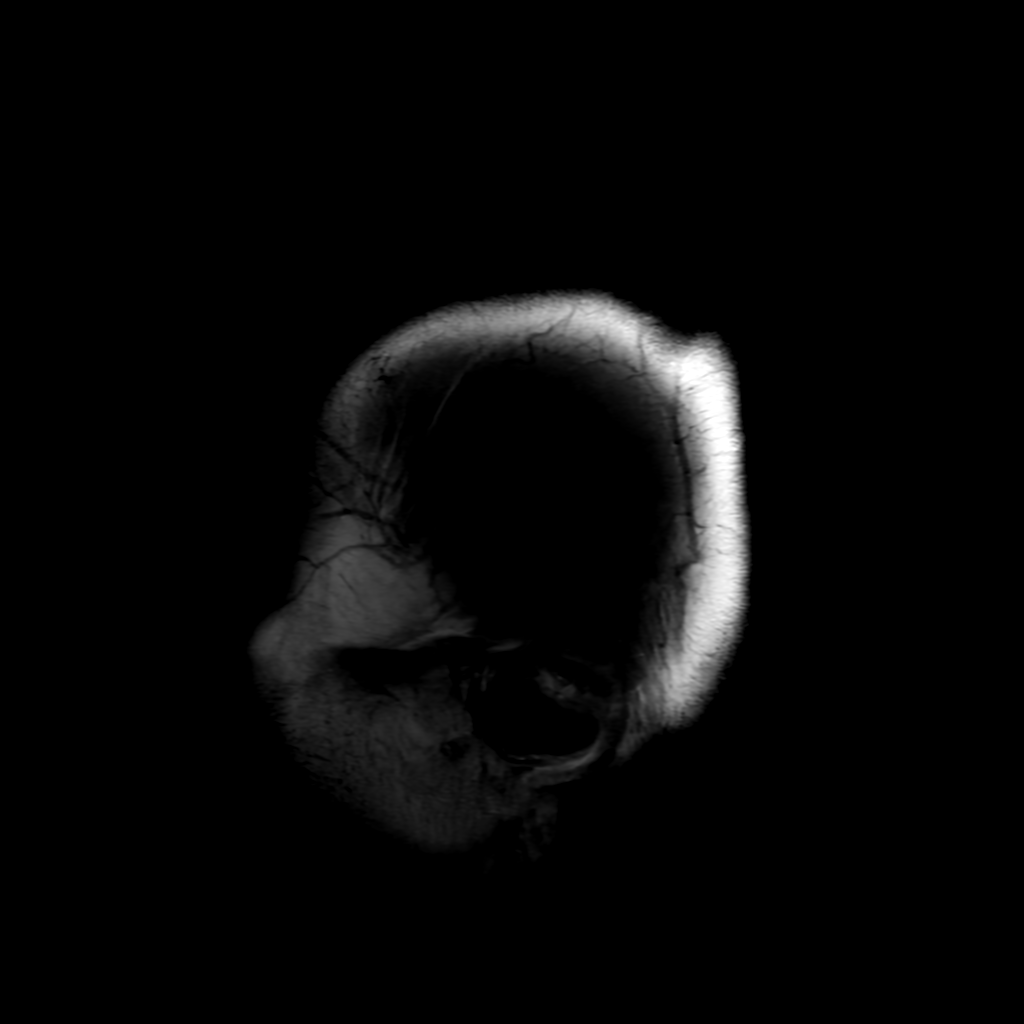

[Series 6: FLAIR · axial · 3.0mm · 0.41mm/px · z∈[-141,-3]mm · 2 of 25 slices shown (2 of 2)]
[im 1/25]
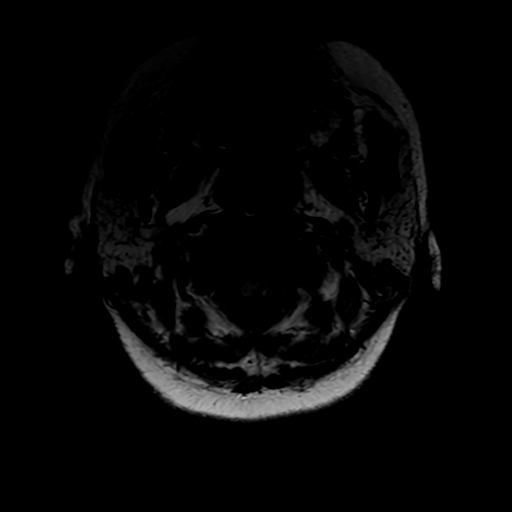
[im 25/25]
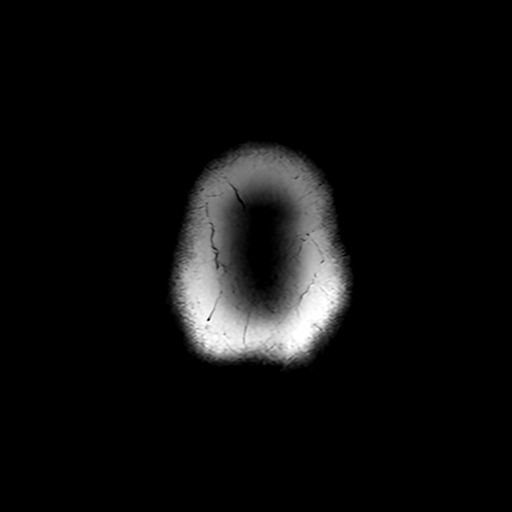

[Series 12: FLAIR post-contrast · sagittal · 5.0mm · 0.23mm/px · 2 of 23 slices shown]
[im 1/23]
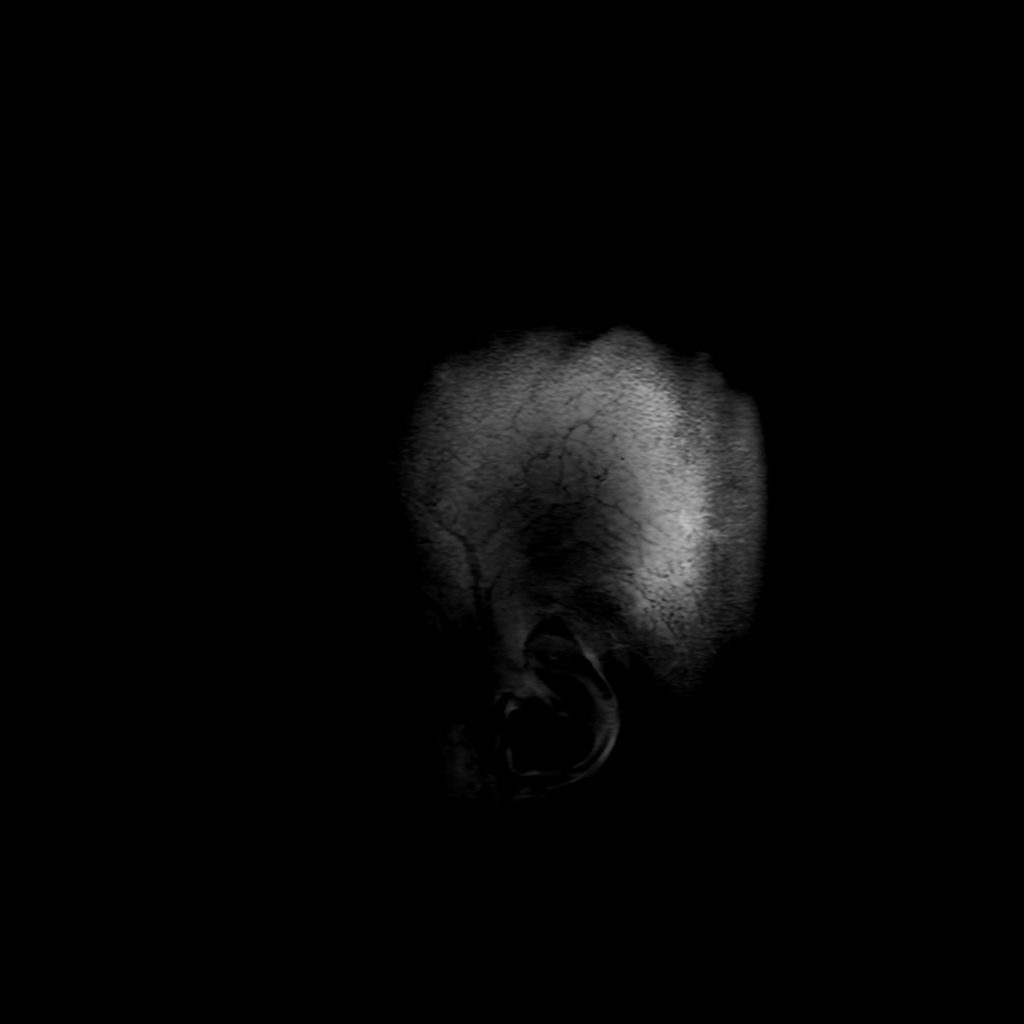
[im 23/23]
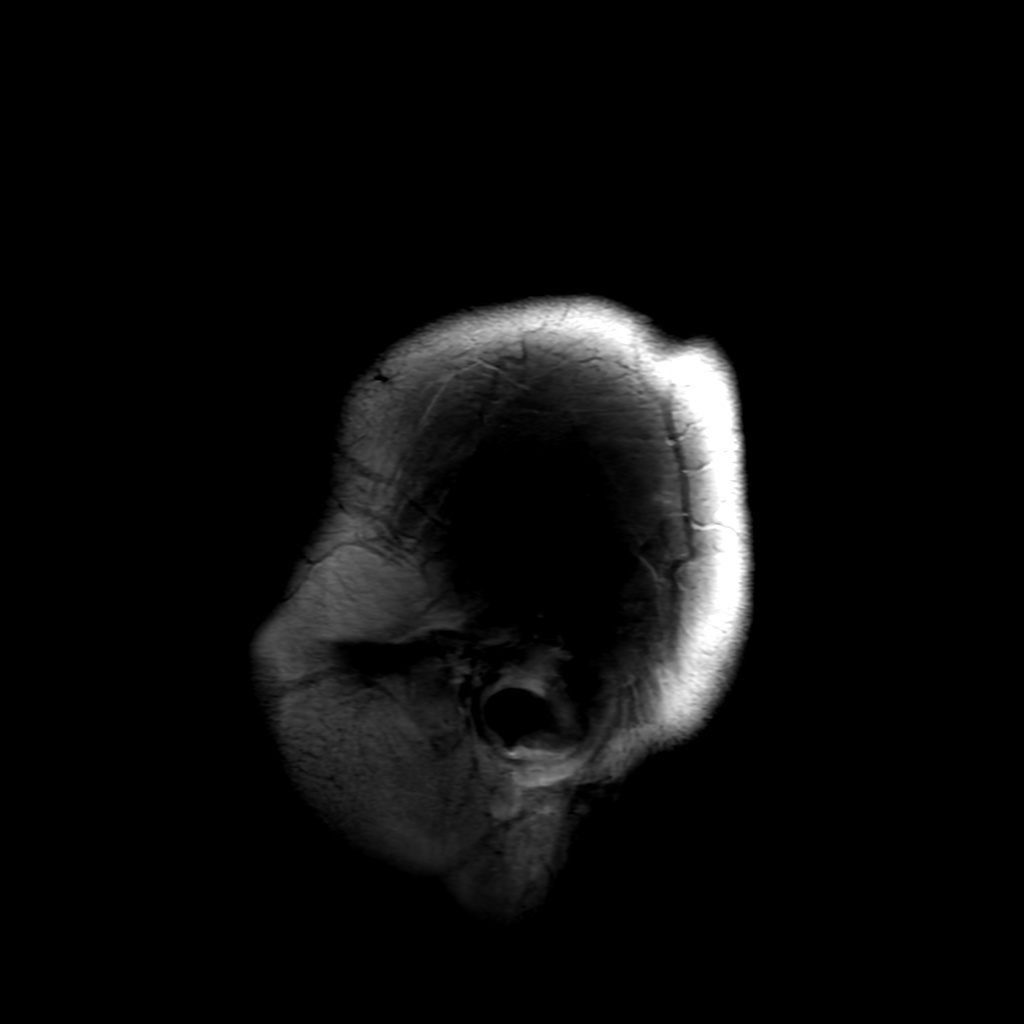

[13 of 48 positions shown; findings below may reference images not displayed]

FINDINGS: Brain: Decreased size of thick-walled peripherally enhancing left
basal ganglia fluid collection with marked internal restricted
diffusion measuring 2.8 x 1.5 cm (10: 26). Confluent white matter
edema involving the left cerebrum is decreased from prior exam.

Partial effacement of the left lateral ventricle without midline
shift. No ventriculomegaly or extra-axial fluid collection.

New enhancement is seen along the left frontal aspiration track.

Subacute infarcts involving the peripheral right frontal lobe,
bilateral thalami, dorsal corpus callosum and left midbrain/pons.
Some of these insults are better demonstrated on prior exam.

Serpiginous cortically based enhancement involving the left
parietal, occipital and temporal lobes likely reflects cortical
laminar necrosis, consistent with subacute insult. No new infarct.

Vascular: Major intracranial flow voids are grossly preserved.

Skull and upper cervical spine: Normal marrow signal.

Sinuses/Orbits: Normal orbits. Mild frontal, ethmoid and right
maxillary sinus mucosal thickening. Layering right maxillary
secretions reflects active inflammation. Bilateral mastoid and
petrous apex effusions.

Other: None.
IMPRESSION: Decreased size of 2.8 cm left basal ganglia abscess. New enhancement
is seen along the left frontal aspiration tract.

Sequela of subacute infarcts involving the right frontal lobe,
bilateral thalami, corpus callosum and left midbrain/pons.

Left parietooccipital and temporal cortical laminar necrosis,
sequela of prior infarct.

Bilateral mastoid and petrous apex effusions.

Mild sinus disease with active right maxillary sinus inflammation.

## 2020-04-15 IMAGING — DX DG ABD PORTABLE 1V
1 series · 1 of 1 positions shown · non-contrast
Comparison: [DATE] and

CLINICAL DATA: Abdominal distension.

EXAM:
PORTABLE ABDOMEN - 1 VIEW

[abdomen kub]
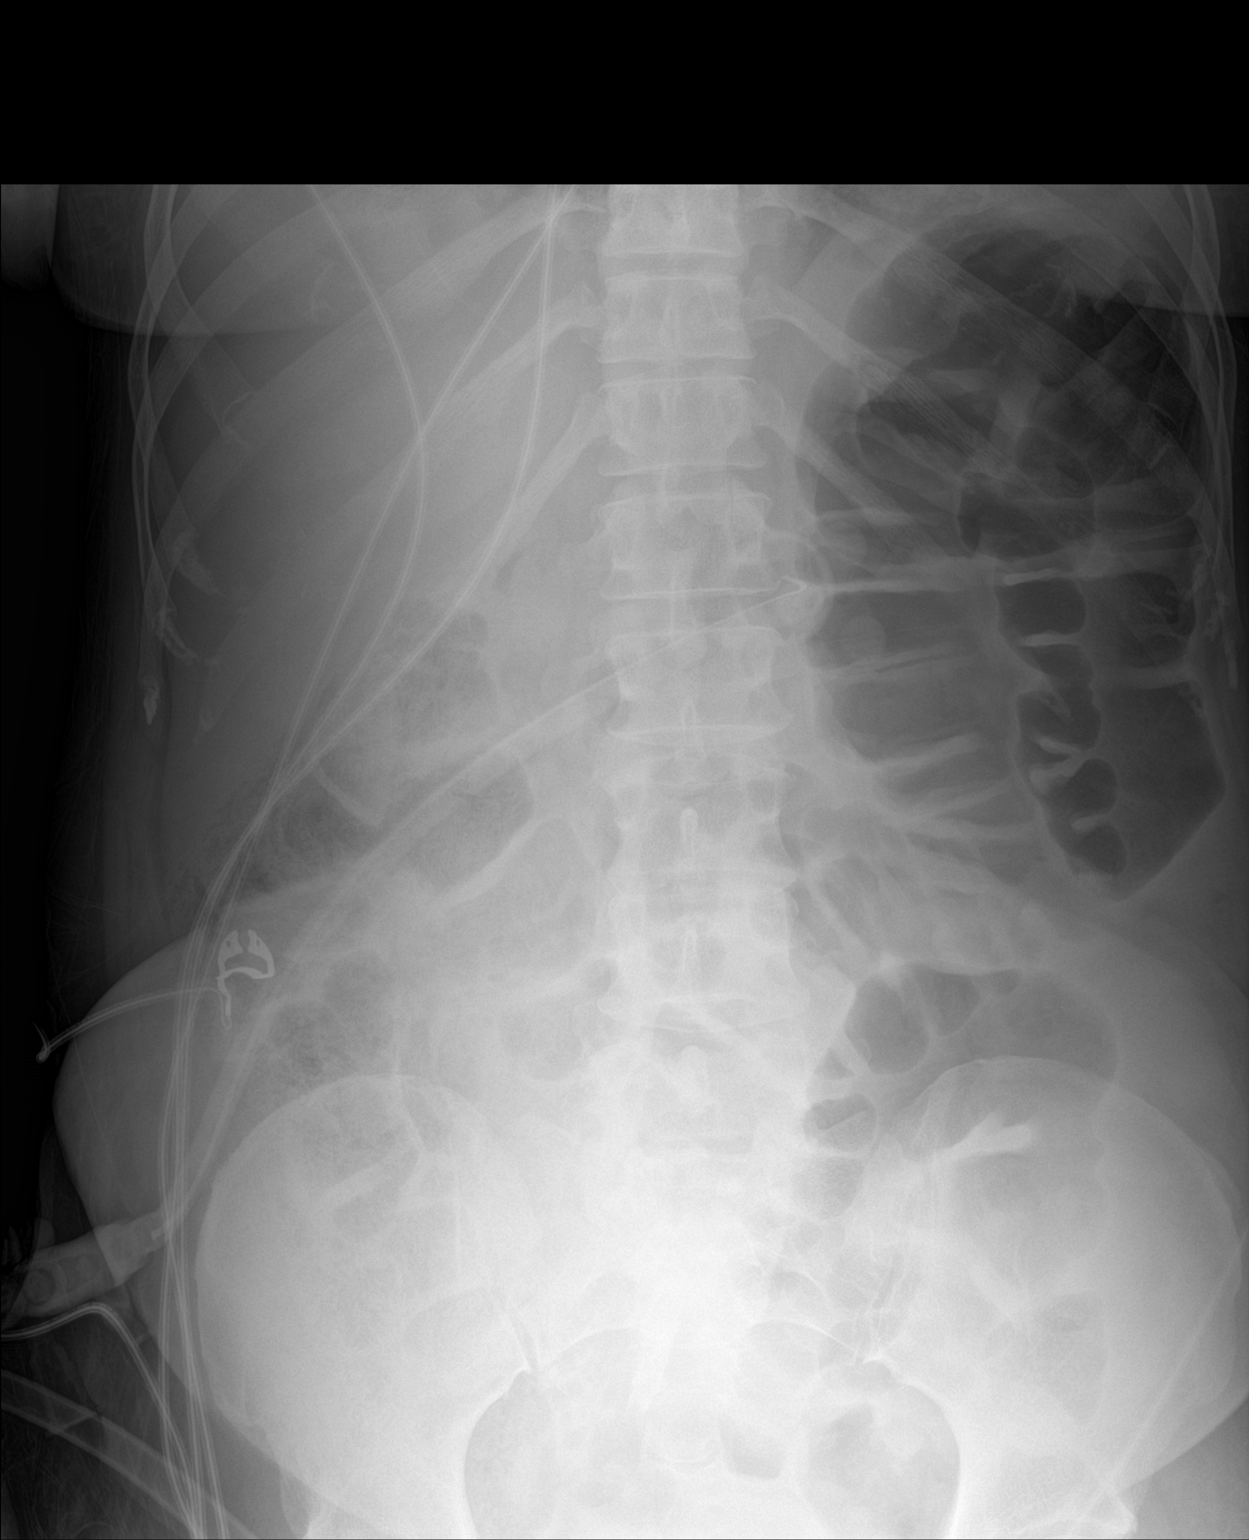

[1 of 1 positions shown; findings below may reference images not displayed]

FINDINGS: Persistent gaseous distention of the colon likely colonic
ileus/inertia. Feeding gastrostomy tube is noted in unchanged
position.
IMPRESSION: Persistent gaseous distention of the colon likely colonic
ileus/inertia.

## 2020-04-15 MED ORDER — SORBITOL 70 % SOLN
30.0000 mL | Freq: Once | Status: AC
  Administered 2020-04-15: 30 mL
  Filled 2020-04-15: qty 30

## 2020-04-15 MED ORDER — GADOBUTROL 1 MMOL/ML IV SOLN
7.0000 mL | Freq: Once | INTRAVENOUS | Status: AC | PRN
  Administered 2020-04-15: 7 mL via INTRAVENOUS

## 2020-04-15 MED ORDER — CHLORHEXIDINE GLUCONATE 0.12 % MT SOLN
OROMUCOSAL | Status: AC
  Administered 2020-04-15: 15 mL via OROMUCOSAL
  Filled 2020-04-15: qty 15

## 2020-04-15 MED ORDER — SORBITOL 70 % SOLN: 960.0000 mL | TOPICAL_OIL | Freq: Once | ORAL | Status: DC

## 2020-04-15 NOTE — Progress Notes (Signed)
[facility]  PROGRESS NOTE  Vickie Alvarado UJW:[id number] DOB: [birth year] DOA: 03/20/2020 PCP: [facility] Admit date - 03/20/2020   Admitting Physician Jacky Kindle, MD  Outpatient Primary MD for the patient is Lup[facility], [facility]  LOS - 60 Brief Narrative   Vickie Alvarado is a 54 y.o. 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  She underwent stereotactic biopsy and aspiration of the abscess by neurosurgery on 03/22/2020 and was transferred to ICU.    Hospital course complicated by worsening mental status requiring MRI which showed new large left PCA infarct along with scattered infarcts in the right basal ganglia and right thalamus concerning for either CVA related to septic emboli versus herniation syndrome.  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 Given her large hemisphere brain abscess with surrounding edema/cerebritis neurosurgery did not recommend decompressive surger  Underwent  PEG placement on 9/9 with tube feeds starting at that time.  CT head shows improvement in fluid collections shows persistent left parietal/occipital acute/subacute infarcts with improvement in midline shift.  She has completed Decadron taper    Subjective   Underwent MRI brain this morning which shows decrease in size of left basal ganglia abscess, but does show new enhancement along the left frontal aspiration tract.  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  Right maxillary sinus inflammation, mild sinus disease-noted on MRI brain 9/20 Status post stereotactic biopsy/aspiration by neurosurgery on 03/22/2020.  MRI brain on 9/20 shows decrease in size of left basal ganglia abscess with no enhancement on the left frontal aspiration tract.  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  Neurologic exam is stable (opens eyes to voice only).  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  No new infarcts on MRI brain -Continue scheduling of offloading of pressure  Abdominal distention, stable.  Had BM yesterday, last x-ray showed no signs of obstruction but still distended consistent with colonic inertia.ileus. Given the location will hold off on further enemas and focus on promotility agents.  -Add sorbitol -, continue scheduled MiraLAX, senna docusate, monitor output -Monitor Serial abdominal xr  Acute hypoxic respiratory failure, status post trach 04/01/2020, stable 4-5 L of O2 on  trach  collar - will need to be cuffless prior to discharge  HTN, stable -Continue Amlodipine 5 mg  Nutrition -Continue Prosource via feeding tube  GERD, stable -continue PPI per tube  Type 2 diabetes, poorly controlled A1c 11.5.  CBG at goal currently -Lantus 20 units daily, NovoLog 3 units every 4 hours while on tube feeds, CBG every 4 h  -Continue IV Rocephin, end date 05/17/2020 for a total of 8 weeks as recommended by ID  Anemia of chronic disease.  Folate levels wnl adequate iron/ferritin -Continue folate supplementation, monitor hemoglobin  History of bipolar disorder, stable -Not on home Seroquel here  Pressure ulcer in gluteal cleft/sacral area,  consistent with stage 2 on my exam (no full loss of dermis, no fat exposure) difficult to promote healing given patient is incontinent of stool -Wound consulted, follow recommendations for cleaning, and foam dressing to sacrum every day    Family Communication  : updated daughter Vickie Alvarado on phone on 9/19, will update today aswell  Code Status : Full  Disposition Plan  :  Patient is from home. Anticipated d/c date: >3 days. Barriers to d/c or necessity for inpatient status:  Currently optimizing bowel regimen as patient has significant stool burden need to avoid obstruction in the colonic region, en, currently requiring IV ceftriaxone for abscess Consults  : Neurosurgery, ID, PCCM, IR  Procedures  :    DVT Prophylaxis  : Heparin  Lab Results  Component Value Date   PLT 192 04/13/2020    Diet :  Diet Order            Diet NPO time specified  Diet effective midnight                  Inpatient Medications Scheduled Meds: . amLODipine  5 mg Per Tube Daily  . chlorhexidine gluconate (MEDLINE KIT)  15 mL Mouth Rinse BID  . Chlorhexidine Gluconate Cloth  6 each Topical Daily  . docusate  100 mg Per Tube BID  . feeding supplement (PROSource TF)  45 mL Per Tube TID  . folic acid  1 mg Per Tube Daily  . heparin  injection (subcutaneous)  5,000 Units Subcutaneous Q8H  . insulin aspart  0-20 Units Subcutaneous Q4H  . insulin aspart  3 Units Subcutaneous Q4H  . insulin glargine  20 Units Subcutaneous Daily  . levETIRAcetam  500 mg Per Tube BID  . mouth rinse  15 mL Mouth Rinse 10 times per day  . pantoprazole sodium  40 mg Per Tube QHS  . polyethylene glycol  17 g Per Tube BID  . sodium chloride flush  10-40 mL Intracatheter Q12H  . sorbitol, milk of mag, mineral oil, glycerin (SMOG) enema  960 mL Rectal Once   Continuous Infusions: . cefTRIAXone (ROCEPHIN)  IV 2 g (04/15/20 0914)  . dextrose 5% lactated ringers 30 mL/hr at 04/14/20 0617  . feeding supplement (JEVITY 1.5 CAL/FIBER) 1,000 mL (04/14/20 1314)   PRN Meds:.acetaminophen (TYLENOL) oral liquid 160 mg/5 mL, acetaminophen **OR** acetaminophen, fentaNYL (SUBLIMAZE) injection, hydrALAZINE, HYDROcodone-acetaminophen, labetalol, ondansetron (ZOFRAN) IV, polyethylene glycol, promethazine, sodium chloride flush  Antibiotics  :   Anti-infectives (From admission, onward)   Start     Dose/Rate Route Frequency Ordered Stop   04/14/20 1715  fluconazole (DIFLUCAN) tablet 150 mg        150 mg Per Tube  Once 04/14/20 1614 04/14/20 1813   04/04/20 1000  ceFAZolin (ANCEF) IVPB 2g/100 mL premix  2 g 200 mL/hr over 30 Minutes Intravenous To Radiology 04/04/20 0935 04/04/20 1053   04/03/20 0600  ceFAZolin (ANCEF) IVPB 1 g/50 mL premix        1 g 100 mL/hr over 30 Minutes Intravenous To Radiology 04/01/20 1239 04/04/20 0600   03/24/20 1030  vancomycin (VANCOREADY) IVPB 1500 mg/300 mL  Status:  Discontinued        1,500 mg 150 mL/hr over 120 Minutes Intravenous Every 8 hours 03/24/20 0952 03/24/20 1336   03/23/20 0130  vancomycin (VANCOCIN) IVPB 1000 mg/200 mL premix  Status:  Discontinued        1,000 mg 200 mL/hr over 60 Minutes Intravenous Every 8 hours 03/22/20 1647 03/24/20 0952   03/22/20 2200  cefTRIAXone (ROCEPHIN) 2 g in sodium chloride 0.9  % 100 mL IVPB        2 g 200 mL/hr over 30 Minutes Intravenous Every 12 hours 03/22/20 1617     03/22/20 1730  vancomycin (VANCOREADY) IVPB 1750 mg/350 mL        1,750 mg 175 mL/hr over 120 Minutes Intravenous  Once 03/22/20 1647 03/22/20 2030   03/22/20 1630  metroNIDAZOLE (FLAGYL) IVPB 500 mg  Status:  Discontinued        500 mg 100 mL/hr over 60 Minutes Intravenous Every 8 hours 03/22/20 1618 03/24/20 1350   03/22/20 1336  bacitracin 50,000 Units in sodium chloride 0.9 % 500 mL irrigation  Status:  Discontinued          As needed 03/22/20 1336 03/22/20 1341   03/22/20 1315  vancomycin (VANCOCIN) IVPB 1000 mg/200 mL premix  Status:  Discontinued        1,000 mg 200 mL/hr over 60 Minutes Intravenous To Surgery 03/22/20 1313 03/22/20 1545   03/22/20 1315  ceFEPIme (MAXIPIME) 2 g in sodium chloride 0.9 % 100 mL IVPB        2 g 200 mL/hr over 30 Minutes Intravenous To Surgery 03/22/20 1313 03/22/20 1354   03/22/20 1145  ceFAZolin (ANCEF) IVPB 2g/100 mL premix  Status:  Discontinued        2 g 200 mL/hr over 30 Minutes Intravenous  Once 03/22/20 1131 03/22/20 1545   03/22/20 1138  ceFAZolin (ANCEF) 2-4 GM/100ML-% IVPB       Note to Pharmacy: Gleason, Ginger   : cabinet override      03/22/20 1138 03/22/20 2344       Objective   Vitals:   04/15/20 0545 04/15/20 0720 04/15/20 0753 04/15/20 1138  BP:  (!) 119/58    Pulse:  71 98 78  Resp: 17 14  16   Temp: 99.2 F (37.3 C) 98.5 F (36.9 C)    TempSrc: Axillary Oral    SpO2: 100% 100%  100%  Weight:      Height:        SpO2: 100 % O2 Flow Rate (L/min): 5 L/min FiO2 (%): 28 %  Wt Readings from Last 3 Encounters:  04/15/20 72.2 kg  03/01/20 88 kg     Intake/Output Summary (Last 24 hours) at 04/15/2020 1218 Last data filed at 04/15/2020 0340 Gross per 24 hour  Intake 90 ml  Output 400 ml  Net -310 ml    Physical Exam:  Opens eyes follows voice today, not following commands, stable neurologic exam Lying comfortably,  in no distress Normal respiratory effort, normal breath sounds, tracheostomy in place on 4 L  Abdomen soft but distended, normal bowel sounds. Feeding tube in place  I have personally reviewed the following:   Data Reviewed:  CBC Recent Labs  Lab 04/13/20 0500  WBC 5.9  HGB 10.6*  HCT 33.2*  PLT 192  MCV 92.5  MCH 29.5  MCHC 31.9  RDW 17.2*    Chemistries  Recent Labs  Lab 04/08/20 1235 04/13/20 0500  NA  --  137  K  --  3.5  CL  --  102  CO2  --  29  GLUCOSE  --  118*  BUN  --  8  CREATININE  --  0.39*  CALCIUM  --  8.5*  MG 1.8  --   AST  --  23  ALT  --  83*  ALKPHOS  --  93  BILITOT  --  0.1*   ------------------------------------------------------------------------------------------------------------------ No results for input(s): CHOL, HDL, LDLCALC, TRIG, CHOLHDL, LDLDIRECT in the last 72 hours.  Lab Results  Component Value Date   HGBA1C 11.5 (H) 03/22/2020   ------------------------------------------------------------------------------------------------------------------ No results for input(s): TSH, T4TOTAL, T3FREE, THYROIDAB in the last 72 hours.  Invalid input(s): FREET3 ------------------------------------------------------------------------------------------------------------------ No results for input(s): VITAMINB12, FOLATE, FERRITIN, TIBC, IRON, RETICCTPCT in the last 72 hours.  Coagulation profile No results for input(s): INR, PROTIME in the last 168 hours.  No results for input(s): DDIMER in the last 72 hours.  Cardiac Enzymes No results for input(s): CKMB, TROPONINI, MYOGLOBIN in the last 168 hours.  Invalid input(s): CK ------------------------------------------------------------------------------------------------------------------ No results found for: BNP  Micro Results No results found for this or any previous visit (from the past 240 hour(s)).  Radiology Reports CT Angio Head W/Cm &/Or Wo Cm  Result Date:  03/20/2020 CLINICAL DATA:  Acute neuro deficit with right-sided weakness. EXAM: CT ANGIOGRAPHY HEAD AND NECK TECHNIQUE: Multidetector CT imaging of the head and neck was performed using the standard protocol during bolus administration of intravenous contrast. Multiplanar CT image reconstructions and MIPs were obtained to evaluate the vascular anatomy. Carotid stenosis measurements (when applicable) are obtained utilizing NASCET criteria, using the distal internal carotid diameter as the denominator. CONTRAST:  23m OMNIPAQUE IOHEXOL 350 MG/ML SOLN COMPARISON:  None. FINDINGS: CT HEAD FINDINGS Brain: Large mass lesion in the left frontal lobe. Delayed imaging reveals irregular enhancement of the periphery with central necrosis. The enhancing component of the mass measures approximately 53 x 28 mm. Surrounding low-density edema/tumor seen around the enhancement. Low-density extends into the left basal ganglia. There is compression of the left lateral ventricle and 7 mm midline shift to the right. No acute hemorrhage. Negative for hydrocephalus Vascular: Negative for hyperdense vessel Skull: Negative Sinuses: Complete opacification right maxillary sinus with bony thickening. Mucosal edema extends into the right frontal and right ethmoid sinus. Remaining sinuses clear. Mastoid clear bilaterally. Orbits: Negative Review of the MIP images confirms the above findings CTA NECK FINDINGS Aortic arch: Normal aortic arch. Azygos branching pattern. Proximal great vessels widely patent. Right carotid system: Right carotid widely patent. Mild noncalcified plaque in the right carotid bulb. Left carotid system: Left carotid widely patent. No significant stenosis or atherosclerotic disease. Vertebral arteries: Both vertebral arteries are widely patent to the basilar without stenosis. Skeleton: Disc degeneration and spurring C5-6 and C6-7. No acute skeletal abnormality. Poor dentition. Other neck: Negative for mass or adenopathy.  Upper chest: Lung apices clear bilaterally Review of the MIP images confirms the above findings CTA HEAD FINDINGS Anterior circulation: Mild atherosclerotic calcification in the cavernous carotid without stenosis. Right middle cerebral artery widely patent. Hypoplastic right A1 segment. Azygos anterior cerebral artery arising  from the left without significant stenosis. There is displacement and stretching of the left MCA branches due to the large left frontal mass. Posterior circulation: Both vertebral arteries patent to the basilar. PICA patent bilaterally. Basilar widely patent. AICA, superior cerebellar, and posterior cerebral arteries patent bilaterally without stenosis. Venous sinuses: Normal venous enhancement Anatomic variants: None Review of the MIP images confirms the above findings IMPRESSION: 1. Large mass lesion left frontal lobe compatible with tumor. Favor glioblastoma. 2. Negative for acute infarct or hemorrhage 3. No significant carotid or vertebral artery stenosis in the neck. 4. Negative for intracranial large vessel occlusion. Electronically Signed   By: Franchot Gallo M.D.   On: 03/20/2020 13:29   DG Abd 1 View  Result Date: 04/11/2020 CLINICAL DATA:  Abdominal distension. EXAM: ABDOMEN - 1 VIEW COMPARISON:  04/10/2020 FINDINGS: Gastrostomy projects over the left upper abdomen. Nonobstructive bowel gas pattern. Moderate stool in the right colon. Gas within the sigmoid colon with decreased distention since prior study. No organomegaly or free air. IMPRESSION: Continued large stool burden in the right colon. No evidence of bowel obstruction. Electronically Signed   By: Rolm Baptise M.D.   On: 04/11/2020 09:26   DG Abd 1 View  Result Date: 04/10/2020 CLINICAL DATA:  Abdominal distension EXAM: ABDOMEN - 1 VIEW COMPARISON:  CT 12/19/2019 FINDINGS: Large proximal colonic stool burden with air distention of the distal colonic segments and some mild thickening of the haustra distally. An air  distended loop seen in the left lower quadrant is present with lack of air and stool over the rectal vault. Percutaneous feeding tube projects over left upper abdomen. Atelectatic changes in the lung bases. No suspicious calcifications. Mild degenerative changes in the spine and pelvis. IMPRESSION: 1. Large proximal colonic stool burden with air distention of the distal colonic segments and some suspect thickening of the haustra distally. Air distended loop seen in the left lower quadrant may reflect a distended sigmoid with lack of air and stool over the rectal vault. Features could reflect constipation, though early distal colonic obstruction/sigmoid volvulus could present with a similar appearance. 2. Percutaneous gastrostomy in the left upper quadrant. Electronically Signed   By: Lovena Le M.D.   On: 04/10/2020 15:57   CT HEAD WO CONTRAST  Result Date: 04/08/2020 CLINICAL DATA:  Stroke. Cerebritis. Cerebral abscess. Left-sided ptosis. EXAM: CT HEAD WITHOUT CONTRAST TECHNIQUE: Contiguous axial images were obtained from the base of the skull through the vertex without intravenous contrast. COMPARISON:  MR head without and with contrast 03/24/2020. CT head without contrast 03/26/2020. FINDINGS: Brain: Previously noted fluid collections in the left operculum have significantly improved. No fluid levels or gas collections are present. Attenuation of the left lentiform nucleus is restored. Persistent hypoattenuation is present throughout the left parietal and occipital white matter. Left occipital cortical infarcts are noted. Midline shift of 2-3 mm is improved. Vascular: Atherosclerotic calcifications are present within the cavernous internal carotid arteries bilaterally. No hyperdense vessel is present. Skull: Left frontal burr hole is noted. Calvarium is otherwise intact. No significant extracranial soft tissue lesion is present. Sinuses/Orbits: Chronic right maxillary sinus specification is present.  Bilateral mastoid effusions are present, left greater than right. Middle ear cavity is clear. No obstruction is present. Sinuses are otherwise clear. The globes and orbits are within normal limits. IMPRESSION: 1. Significantly improved fluid collections in the left operculum. No fluid levels or gas collections are present. 2. Persistent hypoattenuation throughout the left parietal and occipital white matter compatible with acute/subacute infarcts.  3. Left occipital cortical infarcts. 4. Improved midline shift of 2-3 mm. 5. Bilateral mastoid effusions, left greater than right. No obstruction is present. 6. Chronic right maxillary sinus disease. Electronically Signed   By: San Morelle M.D.   On: 04/08/2020 14:48   CT HEAD WO CONTRAST  Result Date: 03/26/2020 CLINICAL DATA:  Cerebral edema.  Status post biopsy. EXAM: CT HEAD WITHOUT CONTRAST TECHNIQUE: Contiguous axial images were obtained from the base of the skull through the vertex without intravenous contrast. COMPARISON:  None. FINDINGS: Brain: Decreased pneumocephalus at the left frontal biopsy site. The degree of edema throughout the left hemisphere has improved slightly. But, there is a new left posterior cerebral artery territory infarct. Rightward midline shift measures 6 mm at the level of the foramina of [place]. There is improved patency of the basal cisterns. No hydrocephalus. Vascular: No hyperdense vessel or unexpected calcification. Skull: Normal. Negative for fracture or focal lesion. Sinuses/Orbits: Complete opacification of the right frontal and maxillary sinuses. Normal orbits. Other: None IMPRESSION: 1. Slightly improved edema throughout the left hemisphere with 6 mm rightward midline shift. Improved patency of the basal cisterns. 2. Left posterior cerebral artery territory infarct. Electronically Signed   By: Ulyses Jarred M.D.   On: 03/26/2020 01:51   CT HEAD WO CONTRAST  Result Date: 03/22/2020 CLINICAL DATA:  Mental status  change. Recent diagnosis of brain lesion post biopsy. EXAM: CT HEAD WITHOUT CONTRAST TECHNIQUE: Contiguous axial images were obtained from the base of the skull through the vertex without intravenous contrast. COMPARISON:  CT 8 hours ago.  Brain MRI earlier today. FINDINGS: Brain: Stable size and appearance of the left brain lesion centered in the basal ganglia. Stable internal air or Gel-Foam. Stable regional mass effect. Left-to-right midline shift of 9 mm, previously 11 mm. No evidence of lesional or acute hemorrhage. Stable sulcal effacement. Lacunar infarct in the right thalamus better appreciated on prior MRI. Stable ventricular size. Basilar cisterns are patent. No developing subdural collection. Vascular: No hyperdense vessel. Skull: Left frontal burr hole.  No fracture or focal lesion. Sinuses/Orbits: Unchanged appearance of right maxillary and frontal sinusitis with opacification of anterior ethmoid air cells. Other: Post biopsy changes in the left frontal scalp. IMPRESSION: 1. Stable size and appearance of the left brain lesion centered in the basal ganglia with internal air or Gel-Foam. Stable regional mass effect. Slightly diminished left-to-right midline shift of 9 mm, previously 11 mm. 2. No evidence of hemorrhage or new abnormality. Electronically Signed   By: Keith Rake M.D.   On: 03/22/2020 23:33   CT HEAD WO CONTRAST  Result Date: 03/22/2020 CLINICAL DATA:  Follow-up brain biopsy. EXAM: CT HEAD WITHOUT CONTRAST TECHNIQUE: Contiguous axial images were obtained from the base of the skull through the vertex without intravenous contrast. COMPARISON:  MRI earlier same day.  CT yesterday. FINDINGS: Brain: Left frontal burr hole. Biopsy site with air or Gel-Foam in the region of the previous tumor with the epicenter in the left basal ganglia. No evidence of postsurgical hemorrhage. No change in regional mass effect with left-to-right shift 11 mm. No extra-axial collection. Vascular: No abnormal  vascular finding. Skull: Otherwise negative Sinuses/Orbits: Ostiomeatal unit pattern of the paranasal sinuses on the right. Other: None IMPRESSION: Biopsy site with air or Gel-Foam in the region of the previous tumor with the epicenter in the left basal ganglia. No evidence of postsurgical hemorrhage. No change in regional mass effect with left-to-right shift 11 mm. Electronically Signed   By: Nelson Chimes  M.D.   On: 03/22/2020 15:58   CT HEAD WO CONTRAST  Result Date: 03/21/2020 CLINICAL DATA:  Brain mass. EXAM: CT HEAD WITHOUT CONTRAST TECHNIQUE: Contiguous axial images were obtained from the base of the skull through the vertex without intravenous contrast. COMPARISON:  Brain MRI from yesterday FINDINGS: Brain: Known mass with central low-density and extensive hemispheric low-density and swelling centered in the deep left cerebrum. Dimensions were better assessed on postcontrast imaging from yesterday. No interval hemorrhage or herniation. Midline shift measures 1 cm without entrapment. Vascular: Negative Skull: Negative Sinuses/Orbits: Chronic right-sided sinusitis with obstruction at the level of the middle meatus. Other: Motion artifact IMPRESSION: 1. BrainLAB protocol but motion artifact at the vertex and skull base due to confusion. If not adequate for intraoperative localization a sedated scan may be required. 2. No new findings when compared to preceding brain MRI. Electronically Signed   By: Monte Fantasia M.D.   On: 03/21/2020 11:32   CT Angio Neck W and/or Wo Contrast  Result Date: 03/20/2020 CLINICAL DATA:  Acute neuro deficit with right-sided weakness. EXAM: CT ANGIOGRAPHY HEAD AND NECK TECHNIQUE: Multidetector CT imaging of the head and neck was performed using the standard protocol during bolus administration of intravenous contrast. Multiplanar CT image reconstructions and MIPs were obtained to evaluate the vascular anatomy. Carotid stenosis measurements (when applicable) are obtained  utilizing NASCET criteria, using the distal internal carotid diameter as the denominator. CONTRAST:  15m OMNIPAQUE IOHEXOL 350 MG/ML SOLN COMPARISON:  None. FINDINGS: CT HEAD FINDINGS Brain: Large mass lesion in the left frontal lobe. Delayed imaging reveals irregular enhancement of the periphery with central necrosis. The enhancing component of the mass measures approximately 53 x 28 mm. Surrounding low-density edema/tumor seen around the enhancement. Low-density extends into the left basal ganglia. There is compression of the left lateral ventricle and 7 mm midline shift to the right. No acute hemorrhage. Negative for hydrocephalus Vascular: Negative for hyperdense vessel Skull: Negative Sinuses: Complete opacification right maxillary sinus with bony thickening. Mucosal edema extends into the right frontal and right ethmoid sinus. Remaining sinuses clear. Mastoid clear bilaterally. Orbits: Negative Review of the MIP images confirms the above findings CTA NECK FINDINGS Aortic arch: Normal aortic arch. Azygos branching pattern. Proximal great vessels widely patent. Right carotid system: Right carotid widely patent. Mild noncalcified plaque in the right carotid bulb. Left carotid system: Left carotid widely patent. No significant stenosis or atherosclerotic disease. Vertebral arteries: Both vertebral arteries are widely patent to the basilar without stenosis. Skeleton: Disc degeneration and spurring C5-6 and C6-7. No acute skeletal abnormality. Poor dentition. Other neck: Negative for mass or adenopathy. Upper chest: Lung apices clear bilaterally Review of the MIP images confirms the above findings CTA HEAD FINDINGS Anterior circulation: Mild atherosclerotic calcification in the cavernous carotid without stenosis. Right middle cerebral artery widely patent. Hypoplastic right A1 segment. Azygos anterior cerebral artery arising from the left without significant stenosis. There is displacement and stretching of the  left MCA branches due to the large left frontal mass. Posterior circulation: Both vertebral arteries patent to the basilar. PICA patent bilaterally. Basilar widely patent. AICA, superior cerebellar, and posterior cerebral arteries patent bilaterally without stenosis. Venous sinuses: Normal venous enhancement Anatomic variants: None Review of the MIP images confirms the above findings IMPRESSION: 1. Large mass lesion left frontal lobe compatible with tumor. Favor glioblastoma. 2. Negative for acute infarct or hemorrhage 3. No significant carotid or vertebral artery stenosis in the neck. 4. Negative for intracranial large vessel occlusion. Electronically Signed  By: Franchot Gallo M.D.   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 EXAM: MRI HEAD WITHOUT AND WITH CONTRAST MRA HEAD WITHOUT CONTRAST TECHNIQUE: Multiplanar, multiecho pulse sequences of the brain and surrounding structures were obtained without and with intravenous contrast. Angiographic images of the head were obtained using MRA technique without contrast. CONTRAST:  99m GADAVIST GADOBUTROL 1 MMOL/ML IV SOLN COMPARISON:  Prior MRI from 03/22/2020 and 03/20/2020. FINDINGS: MRI HEAD FINDINGS Brain: Postoperative changes from interval stereotactic aspiration of previously identified lesion centered at the left basal ganglia is seen. Aspiration tract with a small amount of susceptibility artifacts seen extending via a left frontal approach. The lesion at the left basal ganglia is slightly decreased in size now measuring 5.1 x 2.4 x 2.7 cm, previously 6.0 x 2.4 x 3.5 cm. Associated internal fluid-fluid level with restricted diffusion. Small focus of pneumocephalus at the nondependent portion of this lesion. Per history, this lesion was found to be most consistent with an intracerebral infection/abscess. Persistent  surrounding vasogenic edema throughout the adjacent left cerebral hemisphere with regional mass effect and effacement of the left lateral ventricle. Edema extends into the left cerebral peduncle and left midbrain, similar to previous. Associated left-to-right shift has worsened now measuring up to 10 mm, previously 6 mm. No overt hydrocephalus or ventricular trapping at this time. Crowding of the basilar cisterns with mass effect on the adjacent midbrain, similar to slightly worsened. No transtentorial herniation. 1 cm right thalamic infarct is similar to previous. No associated hemorrhage. Interval development of confluent restricted diffusion throughout the left temporal occipital region, left PCA distribution, consistent with acute ischemia. No associated hemorrhage. Multiple additional new patchy ischemic infarcts involving the bilateral thalami, splenium, parasagittal right temporal occipital region, and left dorsal pons. Right thalamic ischemic changes extend into the right midbrain. These infarcts involve the posterior circulation. An additional curvilinear infarct extending from the right lentiform nucleus towards the right caudate noted as well, favored to be anterior distribution (series 6, image 76). Patchy restricted diffusion also seen in the region of the left hypothalamus (series 6, image 69). Additional small cortical infarct noted at the posterior right frontotemporal region (series 6, image 80). Associated mild petechial hemorrhage at the left dorsal pons without hemorrhagic transformation. Findings favored to be embolic in nature. Mild left a meningeal enhancement noted involving the parasagittal left occipital region (series 29, image 7). While this finding could be reactive in nature related to adjacent left PCA territory infarct, a degree of underlying left a meningeal meningitis related to infection could also be considered. Pituitary stalk is thickened and enhancing as well. Vascular: Major  intracranial vascular flow voids are maintained. Normal opacification seen throughout the major dural sinuses following contrast administration. Skull and upper cervical spine: Craniocervical junction within normal limits. No transtentorial herniation. Bone marrow signal intensity normal. No scalp soft tissue abnormality. Sinuses/Orbits: Globes and orbital soft tissues within normal limits. Extensive paranasal sinus disease involving the right frontoethmoidal and right maxillary sinuses noted. Fluid seen within the nasopharynx. Small bilateral mastoid effusions. Patient is intubated. Other: None. MRA HEAD FINDINGS ANTERIOR CIRCULATION: Examination degraded by motion artifact. Visualized distal cervical segments of the internal carotid arteries are patent with symmetric antegrade flow. Petrous, cavernous, and supraclinoid ICAs patent without stenosis or other abnormality. Left A1 patent. Right A1 hypoplastic and/or absent, accounting for the diminutive right ICA is compared to the  left. Widely patent azygos ACA. No M1 stenosis or occlusion. Normal MCA bifurcations. Distal MCA branches well perfused and symmetric. POSTERIOR CIRCULATION: Vertebral arteries patent to the vertebrobasilar junction without stenosis. Right PICA patent. Left PICA not seen. Basilar mildly irregular but patent to its distal aspect without high-grade stenosis. Superior cerebral arteries patent bilaterally. Both PCAs primarily supplied via the basilar. Irregularity about the PCAs bilaterally with associated mild to moderate stenoses, greater on the left. Specific note made of a moderate left P2 stenosis (series 28, image 14) PCAs remain well perfused to their distal aspects. No aneurysm. IMPRESSION: MRI HEAD IMPRESSION: 1. Postoperative changes from stereotactic aspiration/drainage of left basal ganglia lesion without complication. Lesion is decreased in size now measuring 5.1 x 2.4 x 2.7 cm. Per history, this is consistent with and  intracerebral abscess. 2. Persistent associated vasogenic edema throughout the left cerebral hemisphere with mildly worsened 10 mm left-to-right shift. 3. Interval development of multifocal predominantly posterior circulation infarcts, including a large left PCA territory infarct. Minimal petechial hemorrhage at the left pons without hemorrhagic transformation. A central thromboembolic source is suspected. 4. Patchy leptomeningeal enhancement within the posterior left occipital region. While this may be reactive to the adjacent left PCA territory infarct, a degree of leptomeningeal meningitis related to intracerebral infection could also be considered. 5. Extensive right-sided paranasal sinus disease. MRA HEAD IMPRESSION: 1. Negative intracranial MRA for large vessel occlusion. 2. Scattered vascular irregularity involving the basilar and bilateral PCAs without occlusion. No proximal high-grade or correctable stenosis. No aneurysm. Findings discussed by called by telephone on 03/24/2020 at approximately 5:30 am with provider Vickie Alvarado. Electronically Signed   By: Vickie Alvarado M.D.   On: 03/24/2020 06:44   MR BRAIN W WO CONTRAST  Result Date: 04/15/2020 CLINICAL DATA:  Brain abscess EXAM: MRI HEAD WITHOUT AND WITH CONTRAST TECHNIQUE: Multiplanar, multiecho pulse sequences of the brain and surrounding structures were obtained without and with intravenous contrast. CONTRAST:  70m GADAVIST GADOBUTROL 1 MMOL/ML IV SOLN COMPARISON:  03/24/2020 MRI/MRA head and prior.  04/08/2020 head CT. FINDINGS: Brain: Decreased size of thick-walled peripherally enhancing left basal ganglia fluid collection with marked internal restricted diffusion measuring 2.8 x 1.5 cm (10: 26). Confluent white matter edema involving the left cerebrum is decreased from prior exam. Partial effacement of the left lateral ventricle without midline shift. No ventriculomegaly or extra-axial fluid collection. New enhancement is seen along the left  frontal aspiration track. Subacute infarcts involving the peripheral right frontal lobe, bilateral thalami, dorsal corpus callosum and left midbrain/pons. Some of these insults are better demonstrated on prior exam. Serpiginous cortically based enhancement involving the left parietal, occipital and temporal lobes likely reflects cortical laminar necrosis, consistent with subacute insult. No new infarct. Vascular: Major intracranial flow voids are grossly preserved. Skull and upper cervical spine: Normal marrow signal. Sinuses/Orbits: Normal orbits. Mild frontal, ethmoid and right maxillary sinus mucosal thickening. Layering right maxillary secretions reflects active inflammation. Bilateral mastoid and petrous apex effusions. Other: None. 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 New enhancement is seen along the left frontal aspiration tract. Sequela of subacute infarcts involving the right frontal lobe, bilateral thalami, corpus callosum and left midbrain/pons. Left parietooccipital and temporal cortical laminar necrosis, sequela of prior infarct. Bilateral mastoid and petrous apex effusions. Mild sinus disease with active right maxillary sinus inflammation. Electronically Signed   By: CPrimitivo GauzeM.D.   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 EXAM: MRI HEAD WITHOUT AND WITH CONTRAST MRA HEAD WITHOUT CONTRAST TECHNIQUE: Multiplanar, multiecho pulse sequences of the brain and surrounding structures were obtained without and with intravenous contrast. Angiographic images of the head were obtained using MRA technique without contrast. CONTRAST:  18m GADAVIST GADOBUTROL 1 MMOL/ML IV SOLN COMPARISON:  Prior MRI from 03/22/2020 and 03/20/2020. FINDINGS: MRI HEAD FINDINGS Brain: Postoperative changes from interval stereotactic aspiration of  previously identified lesion centered at the left basal ganglia is seen. Aspiration tract with a small amount of susceptibility artifacts seen extending via a left frontal approach. The lesion at the left basal ganglia is slightly decreased in size now measuring 5.1 x 2.4 x 2.7 cm, previously 6.0 x 2.4 x 3.5 cm. Associated internal fluid-fluid level with restricted diffusion. Small focus of pneumocephalus at the nondependent portion of this lesion. Per history, this lesion was found to be most consistent with an intracerebral infection/abscess. Persistent surrounding vasogenic edema throughout the adjacent left cerebral hemisphere with regional mass effect and effacement of the left lateral ventricle. Edema extends into the left cerebral peduncle and left midbrain, similar to previous. Associated left-to-right shift has worsened now measuring up to 10 mm, previously 6 mm. No overt hydrocephalus or ventricular trapping at this time. Crowding of the basilar cisterns with mass effect on the adjacent midbrain, similar to slightly worsened. No transtentorial herniation. 1 cm right thalamic infarct is similar to previous. No associated hemorrhage. Interval development of confluent restricted diffusion throughout the left temporal occipital region, left PCA distribution, consistent with acute ischemia. No associated hemorrhage. Multiple additional new patchy ischemic infarcts involving the bilateral thalami, splenium, parasagittal right temporal occipital region, and left dorsal pons. Right thalamic ischemic changes extend into the right midbrain. These infarcts involve the posterior circulation. An additional curvilinear infarct extending from the right lentiform nucleus towards the right caudate noted as well, favored to be anterior distribution (series 6, image 76). Patchy restricted diffusion also seen in the region of the left hypothalamus (series 6, image 69). Additional small cortical infarct noted at the posterior  right frontotemporal region (series 6, image 80). Associated mild petechial hemorrhage at the left dorsal pons without hemorrhagic transformation. Findings favored to be embolic in nature. Mild left a meningeal enhancement noted involving the parasagittal left occipital region (series 29, image 7). While this finding could be reactive in nature related to adjacent left PCA territory infarct, a degree of underlying left a meningeal meningitis related to infection could also be considered. Pituitary stalk is thickened and enhancing as well. Vascular: Major intracranial vascular flow voids are maintained. Normal opacification seen throughout the major dural sinuses following contrast administration. Skull and upper cervical spine: Craniocervical junction within normal limits. No transtentorial herniation. Bone marrow signal intensity normal. No scalp soft tissue abnormality. Sinuses/Orbits: Globes and orbital soft tissues within normal limits. Extensive paranasal sinus disease involving the right frontoethmoidal and right maxillary sinuses noted. Fluid seen within the nasopharynx. Small bilateral mastoid effusions. Patient is intubated. Other: None. MRA HEAD FINDINGS ANTERIOR CIRCULATION: Examination degraded by motion artifact. Visualized distal cervical segments of the internal carotid arteries are patent with symmetric antegrade flow. Petrous, cavernous, and supraclinoid ICAs patent without stenosis or other abnormality. Left A1 patent. Right A1 hypoplastic and/or absent, accounting for the diminutive right ICA is compared to the left. Widely patent azygos ACA. No M1 stenosis or occlusion. Normal MCA bifurcations. Distal MCA branches well perfused and symmetric. POSTERIOR CIRCULATION: Vertebral arteries  patent to the vertebrobasilar junction without stenosis. Right PICA patent. Left PICA not seen. Basilar mildly irregular but patent to its distal aspect without high-grade stenosis. Superior cerebral arteries patent  bilaterally. Both PCAs primarily supplied via the basilar. Irregularity about the PCAs bilaterally with associated mild to moderate stenoses, greater on the left. Specific note made of a moderate left P2 stenosis (series 28, image 14) PCAs remain well perfused to their distal aspects. No aneurysm. IMPRESSION: MRI HEAD IMPRESSION: 1. Postoperative changes from stereotactic aspiration/drainage of left basal ganglia lesion without complication. Lesion is decreased in size now measuring 5.1 x 2.4 x 2.7 cm. Per history, this is consistent with and intracerebral abscess. 2. Persistent associated vasogenic edema throughout the left cerebral hemisphere with mildly worsened 10 mm left-to-right shift. 3. Interval development of multifocal predominantly posterior circulation infarcts, including a large left PCA territory infarct. Minimal petechial hemorrhage at the left pons without hemorrhagic transformation. A central thromboembolic source is suspected. 4. Patchy leptomeningeal enhancement within the posterior left occipital region. While this may be reactive to the adjacent left PCA territory infarct, a degree of leptomeningeal meningitis related to intracerebral infection could also be considered. 5. Extensive right-sided paranasal sinus disease. MRA HEAD IMPRESSION: 1. Negative intracranial MRA for large vessel occlusion. 2. Scattered vascular irregularity involving the basilar and bilateral PCAs without occlusion. No proximal high-grade or correctable stenosis. No aneurysm. Findings discussed by called by telephone on 03/24/2020 at approximately 5:30 am with provider Vickie Alvarado. Electronically Signed   By: Vickie Alvarado M.D.   On: 03/24/2020 06:44   MR BRAIN W WO CONTRAST  Result Date: 03/22/2020 CLINICAL DATA:  Brain mass EXAM: MRI HEAD WITHOUT AND WITH CONTRAST TECHNIQUE: Multiplanar, multiecho pulse sequences of the brain and surrounding structures were obtained without and with intravenous contrast.  CONTRAST:  12m GADAVIST GADOBUTROL 1 MMOL/ML IV SOLN COMPARISON:  03/20/2020 FINDINGS: Brain: When accounting for differences in technique (axial scan angle is slightly different), similar size of the mass centered within the left basal ganglia, with the enhancing portion measuring up to approximately 6.4 x 2.8 by 3.6 cm. Redemonstrated nodular peripheral enhancement with central necrosis. Similar exuberant surrounding T2/stir hyperintensity with marked effacement left lateral ventricle and approximately 9 mm of rightward midline shift at the [place] No progressive ventriculomegaly. The mass restricts diffusion peripherally without central restricted diffusion. Additionally, there is a new area of restricted diffusion within the right thalamus, compatible with acute infarct (series 550, image 28). Mild associated T2/STIR hyperintensity. Vascular: Limited evaluation; however, major vessels at the base of the brain demonstrate normal flow voids. Skull and upper cervical spine: Negative Sinuses/Orbits: There is complete opacification of the right frontal, anterior ethmoid air cells, and right maxillary sinus, compatible with ostiomeatal unit pattern sinus disease. Other: Small left mastoid effusion. IMPRESSION: 1. New/acute infarct within the right thalamus. 2. 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 Resulting effacement of the left lateral ventricle and approximately 9 mm of right midline shift, not substantially changed. No progressive ventriculomegaly. 3. Similar right-sided ostiomeatal unit pattern paranasal sinusitis. Critical Value/emergent results were called by telephone at the time of interpretation on 03/22/2020 at 10:35 am to provider Dr. DReatha Alvarado who verbally acknowledged these results.  Electronically Signed   By: FMargaretha SheffieldMD   On: 03/22/2020 10:39   MR Brain W and WLottie DawsonContrast  Result Date: 03/20/2020 CLINICAL DATA:  Acute right-sided weakness. Brain tumor shown by CT angiography. EXAM: MRI HEAD WITHOUT AND WITH CONTRAST TECHNIQUE: Multiplanar, multiecho pulse sequences of the brain and surrounding structures were obtained without and with intravenous contrast. CONTRAST:  8.26m GADAVIST GADOBUTROL 1 MMOL/ML IV SOLN COMPARISON:  CT studies earlier same day FINDINGS: Brain: Approximately 6.5 X 4.5 x 4 cm tumor with the epicenter in the left basal ganglia and radiating white matter tracts region. Central necrosis. Contrast enhancement along the margins of the necrosis. The enhancing region measures 6 x 2.4 x 3.5 cm. There is mass effect with flattening of the left lateral ventricle. Left-to-right midline shift of 6 mm. No ventricular trapping. No satellite lesion identified. No second brain mass. No evidence of ischemic stroke. Vascular: Major vessels at the base of the brain show flow. Skull and upper cervical spine: Negative Sinuses/Orbits: Ostiomeatal unit pattern on the right with opacification of the right maxillary, ethmoid and frontal sinuses. Orbits negative. Other: None IMPRESSION: 6.5 x 4.5 x 4 cm tumor with the epicenter in the left basal ganglia and radiating white matter tracts. Central necrosis. Contrast enhancement along the margins of the necrosis. The enhancing region measures 6 x 2.4 x 3.5 cm. There is mass effect with flattening of the left lateral ventricle. Left-to-right midline shift of 6 mm. No ventricular trapping. No satellite lesion identified. 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 Right-sided paranasal sinusitis with ostiomeatal unit pattern. Electronically Signed   By: MNelson ChimesM.D.   On: 03/20/2020 15:44   IR GASTROSTOMY TUBE MOD SED  Result Date: 04/04/2020 INDICATION: Dysphagia. Please perform percutaneous  gastrostomy tube for enteric nutrition supplementation purposes. EXAM: PULL TROUGH GASTROSTOMY TUBE PLACEMENT COMPARISON:  CT abdomen and pelvis-03/20/2020 MEDICATIONS: Ancef 2 gm IV; Antibiotics were administered within 1 hour of the procedure. Glucagon 1 mg IV CONTRAST:  20 mL of Omnipaque 300 administered into the gastric lumen. ANESTHESIA/SEDATION: Moderate (conscious) sedation was employed during this procedure. A total of Versed 1 mg and Fentanyl 50 mcg was administered intravenously. Moderate Sedation Time: 20 minutes. The patient's level of consciousness and vital signs were monitored continuously by radiology nursing throughout the procedure under my direct supervision. FLUOROSCOPY TIME:  1 minutes, 12 seconds (5 mGy) COMPLICATIONS: None immediate. PROCEDURE: Informed written consent was obtained from the patient's family following explanation of the procedure, risks, benefits and alternatives. A time out was performed prior to the initiation of the procedure. Ultrasound scanning was performed to demarcate the edge of the left lobe of the liver. Maximal barrier sterile technique utilized including caps, mask, sterile gowns, sterile gloves, large sterile drape, hand hygiene and Betadine prep. The left upper quadrant was sterilely prepped and draped. An oral gastric catheter was inserted into the stomach under fluoroscopy. The existing nasogastric feeding tube was removed. The left costal margin and air opacified transverse colon were identified and avoided. Air was injected into the stomach for insufflation and visualization under fluoroscopy. Under sterile conditions a 17 gauge trocar needle was utilized to access the stomach percutaneously beneath the left subcostal margin after the overlying soft tissues were anesthetized with 1% Lidocaine with epinephrine. Needle position was confirmed within the stomach with aspiration of air and injection of small amount of contrast. A single T tack was deployed for  gastropexy. Over an Amplatz guide wire, a 9-French sheath was inserted into the stomach. A snare device was utilized to capture the oral gastric catheter. The snare device was pulled retrograde from  the stomach up the esophagus and out the oropharynx. The 20-French pull-through gastrostomy was connected to the snare device and pulled antegrade through the oropharynx down the esophagus into the stomach and then through the percutaneous tract external to the patient. The gastrostomy was assembled externally. Contrast injection confirms position in the stomach. Several spot radiographic images were obtained in various obliquities for documentation. The patient tolerated procedure well without immediate post procedural complication. FINDINGS: After successful fluoroscopic guided placement, the gastrostomy tube is appropriately positioned with internal disc against the ventral aspect of the gastric lumen. IMPRESSION: Successful fluoroscopic insertion of a 20-French pull-through gastrostomy tube. The gastrostomy may be used immediately for medication administration and in 24 hrs for the initiation of feeds. Electronically Signed   By: Sandi Mariscal M.D.   On: 04/04/2020 14:48   CT CHEST ABDOMEN PELVIS W CONTRAST  Result Date: 03/20/2020 CLINICAL DATA:  54 year old female with brain mass. Evaluate for metastatic disease. EXAM: CT CHEST, ABDOMEN, AND PELVIS WITH CONTRAST TECHNIQUE: Multidetector CT imaging of the chest, abdomen and pelvis was performed following the standard protocol during bolus administration of intravenous contrast. CONTRAST:  168m OMNIPAQUE IOHEXOL 350 MG/ML SOLN COMPARISON:  None. FINDINGS: CT CHEST FINDINGS Cardiovascular: There is no cardiomegaly or pericardial effusion. There is noncalcified plaque along the thoracic aorta and aortic arch. No aneurysmal dilatation or dissection. The origins of the great vessels of the aortic arch appear patent. The central pulmonary arteries are unremarkable for  the degree of opacification. Mediastinum/Nodes: Top-normal right hilar lymph nodes. No adenopathy. The esophagus and the thyroid gland are grossly unremarkable. No mediastinal fluid collection. Lungs/Pleura: There is a patchy area of consolidation with air bronchograms involving the right lower lobe which may represent atelectasis but concerning for pneumonia. Aspiration is not excluded. Clinical correlation and follow-up to resolution recommended to exclude an underlying mass. Faint left lung base linear and platelike atelectasis noted. There is no pleural effusion or pneumothorax. The central airways are patent. Musculoskeletal: No acute osseous pathology. CT ABDOMEN PELVIS FINDINGS No intra-abdominal free air. There is a small free fluid in the pelvis. Hepatobiliary: The liver is unremarkable. No intrahepatic biliary ductal dilatation. The gallbladder is unremarkable. Pancreas: Unremarkable. No pancreatic ductal dilatation or surrounding inflammatory changes. Spleen: Normal in size without focal abnormality. Adrenals/Urinary Tract: The adrenal glands are unremarkable. There is no hydronephrosis on either side. There is symmetric enhancement and excretion of contrast by both kidneys. The visualized ureters and urinary bladder appear unremarkable. Stomach/Bowel: There is moderate stool throughout the colon. There is no bowel obstruction or active inflammation. The appendix is normal. Vascular/Lymphatic: The abdominal aorta and IVC are unremarkable. No portal venous gas. There is no adenopathy. Reproductive: Hysterectomy. There is a 9 mm calcific focus in the right posterior pelvis which is not evaluated but may be associated with the ovary. Pelvic ultrasound may provide better evaluation. Other: Anterior pelvic wall surgical scar.  No fluid collection. Musculoskeletal: Degenerative changes of the spine. No acute osseous pathology. IMPRESSION: 1. Right lower lobe consolidation concerning for pneumonia. Aspiration is  not excluded. Clinical correlation and follow-up to resolution recommended to exclude an underlying mass. 2. No evidence of metastatic disease in the chest, abdomen or pelvis. 3. An indeterminate 9 mm calcific focus in the right posterior pelvis may be associated with the ovary. Pelvic ultrasound may provide better evaluation. 4. Aortic Atherosclerosis (ICD10-I70.0). Electronically Signed   By: AAnner CreteM.D.   On: 03/20/2020 19:52   [facility]  Result  Date: 03/28/2020 CLINICAL DATA:  Respiratory failure.  Hypoxia.  COVID positive. EXAM: PORTABLE CHEST 1 VIEW COMPARISON:  03/27/2020. FINDINGS: Endotracheal tube and NG tube in stable position. Right PICC line stable position. Heart size stable. Low lung volumes with persistent bibasilar atelectasis/infiltrates. Interim improvement in aeration from prior exam. No prominent pleural effusion. No pneumothorax. IMPRESSION: 1.  Lines and tubes stable position. 2. Low lung volumes with persistent bibasilar atelectasis/infiltrates. Interim improvement in aeration from prior exam. Electronically Signed   By: Marcello Moores  Register   On: 03/28/2020 06:14   DG CHEST PORT 1 VIEW  Result Date: 03/27/2020 CLINICAL DATA:  Respiratory failure. EXAM: PORTABLE CHEST 1 VIEW COMPARISON:  03/23/2020 FINDINGS: 0523 hours. Endotracheal tube tip has pulled back in the interval and is now approximately 6.3 cm above the base of the carina. The NG tube passes into the stomach although the distal tip position is not included on the film. Interval improvement in basilar aeration with some persistent/residual streaky opacity in the bases, likely atelectasis. No pulmonary edema or pleural effusion. The visualized bony structures of the thorax show no acute abnormality. Telemetry leads overlie the chest. IMPRESSION: 1. Interval repositioning of the endotracheal tube. 2. Interval improvement in basilar aeration with some residual probable atelectasis at the lung bases.  Electronically Signed   By: Misty Stanley M.D.   On: 03/27/2020 07:25   Portable Chest x-ray  Result Date: 03/23/2020 CLINICAL DATA:  54 year old female status post intubation. EXAM: PORTABLE CHEST 1 VIEW COMPARISON:  Chest CT dated 03/20/2020. FINDINGS: Endotracheal tube with tip at the level of the carina tilting towards the right mainstem bronchus. Recommend retraction by approximately 4 cm for optimal positioning. Enteric tube extends below the diaphragm with tip beyond the inferior margin of the image. Bilateral streaky and hazy airspace densities may represent atelectasis or pneumonia. No large pleural effusion. No pneumothorax. The cardiac silhouette is within limits. No acute osseous pathology. IMPRESSION: 1. Endotracheal tube with tip at the level of the carina tilting towards the right mainstem bronchus. Recommend retraction by 4 cm for optimal positioning. 2. Bilateral streaky and hazy airspace densities may represent atelectasis or pneumonia. These results were called by telephone at the time of interpretation on 03/23/2020 at 12:57 am to nurse Purcell Nails, who verbally acknowledged these results. Electronically Signed   By: Anner Crete M.D.   On: 03/23/2020 01:03   DG Abd Portable 1V  Result Date: 04/14/2020 CLINICAL DATA:  Evaluate for stool within the right colon. EXAM: PORTABLE ABDOMEN - 1 VIEW COMPARISON:  04/13/2020 FINDINGS: Gastrostomy tube is again noted projecting over the left upper abdomen. Similar volume of stool noted within the right colon. Gaseous distension of the transverse colon and splenic flexure is again identified with intraluminal gas up to the level of the rectum. IMPRESSION: 1. Similar volume of stool within the right colon. 2. Persistent gaseous distension of the transverse colon and splenic flexure. Electronically Signed   By: Vickie Alvarado M.D.   On: 04/14/2020 08:44   DG Abd Portable 1V  Result Date: 04/13/2020 CLINICAL DATA:  55 year old female with a  history of abdominal distension EXAM: PORTABLE ABDOMEN - 1 VIEW COMPARISON:  04/12/2020, 04/11/2020 FINDINGS: Formed stool within the right colon. Gas-filled transverse colon and sigmoid colon extending to the rectum. No transition point. No abnormally distended small bowel. Gastric tube projects over the upper abdomen. Unremarkable skeletal structures IMPRESSION: Similar appearance of the x-ray with right-sided stool burden and gas filled colon. No evidence of obstruction. Electronically  Signed   By: Vickie Alvarado D.O.   On: 04/13/2020 11:18   DG Abd Portable 1V  Result Date: 04/12/2020 CLINICAL DATA:  Abdominal distention EXAM: PORTABLE ABDOMEN - 1 VIEW COMPARISON:  04/11/2020 FINDINGS: Gaseous distention of the colon noted. Moderate stool burden particularly in the right colon. Findings are similar to prior study. No organomegaly or free air. IMPRESSION: Moderate stool burden in the right colon with mild gaseous distention of the colon. No change. Electronically Signed   By: Rolm Baptise M.D.   On: 04/12/2020 08:40   [place] EKG SITE RITE  Result Date: 03/23/2020 If Site Rite image not attached, placement could not be confirmed due to current cardiac rhythm.    Time Spent in minutes  >30     Vickie Alvarado M.D on 04/15/2020 at 12:18 PM  To page go to [email or web address] - password [email or web address]

## 2020-04-15 NOTE — Progress Notes (Signed)
This chaplain attempted PMT consult for spiritual care.  Pt. out of room. Visitor-Marcella on the phone in the Pt. room. F/U spiritual care is available as needed.

## 2020-04-16 ENCOUNTER — Inpatient Hospital Stay (HOSPITAL_COMMUNITY): Payer: Medicare HMO

## 2020-04-16 DIAGNOSIS — R4182 Altered mental status, unspecified: Secondary | ICD-10-CM

## 2020-04-16 LAB — GLUCOSE, CAPILLARY
Glucose-Capillary: 136 mg/dL — ABNORMAL HIGH (ref 70–99)
Glucose-Capillary: 138 mg/dL — ABNORMAL HIGH (ref 70–99)
Glucose-Capillary: 87 mg/dL (ref 70–99)
Glucose-Capillary: 84 mg/dL (ref 70–99)
Glucose-Capillary: 101 mg/dL — ABNORMAL HIGH (ref 70–99)
Glucose-Capillary: 121 mg/dL — ABNORMAL HIGH (ref 70–99)

## 2020-04-16 IMAGING — DX DG ABD PORTABLE 1V
1 series · 1 of 1 positions shown · non-contrast
Comparison: the previous day's study

CLINICAL DATA: Distension

EXAM:
PORTABLE ABDOMEN - 1 VIEW

[abdomen kub]
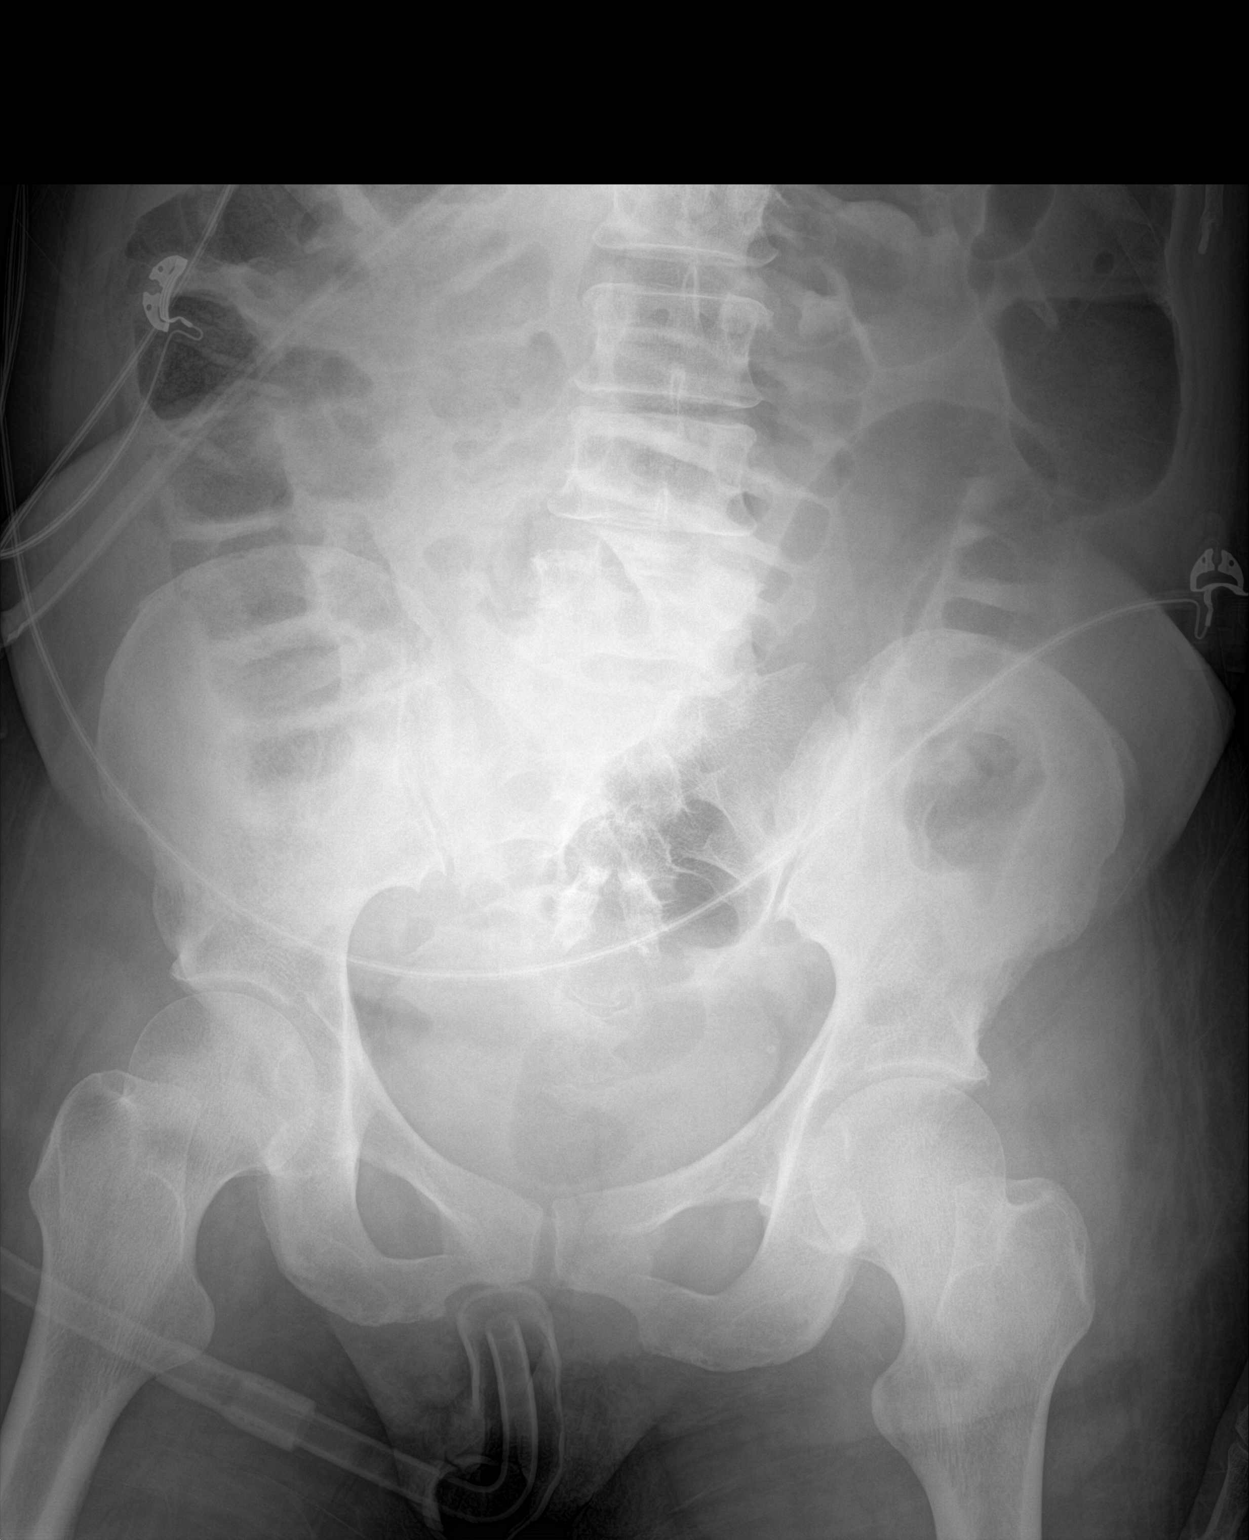

[1 of 1 positions shown; findings below may reference images not displayed]

FINDINGS: Gastrostomy tube in place. The small bowel is nondilated. There is
mild gaseous distention of the colon as before, without dilatation.
Stable left pelvic phlebolith. Regional bones unremarkable.
IMPRESSION: Little change in mild gaseous distention of the colon.

## 2020-04-16 MED ORDER — COLLAGENASE 250 UNIT/GM EX OINT
TOPICAL_OINTMENT | Freq: Every day | CUTANEOUS | Status: DC
  Filled 2020-04-16 (×2): qty 30

## 2020-04-16 NOTE — Progress Notes (Signed)
   Vital Signs @MEWSNOTE @  Patients HR has increased and decreased in the last 12 hours. Patient VSS, Hr in the 90s, Temp 99.34F ax, BP stable as well. Will reassess VS and continue to monitor      04/16/2020,1:48 AM

## 2020-04-16 NOTE — Progress Notes (Signed)
Occupational Therapy Treatment Patient Details Name: Vickie Alvarado MRN: [id number] DOB: 1966/07/07 Today's Date: 04/16/2020    History of present illness 54 y/o F with a history of HTN, HLD, COVID-19 infection (03/01/20), dental infections, and sinus infections presented to [facility] after being found altered with R sided weakness, dysarthria, and R facial droop. Patient had decreased  P.O. intake, decreased appetite, and projectile vomiting in addition to her other symptoms. 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 Neurosurgery performed a L frontal stereotactic biopsy/aspiration of the abscess. 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 ETT 8/28, trach 9/6.   OT comments  Pt with increased response as compared to previous OT session. With increased time, pt appears to close eyes on command. Pt spontaneously moving head, visually scanning and moving RUE spontaneously. Gospel music played during session and pt appeared to have increased response with spontaneous movement noted (R > L). With weight bearing - pt appears to push through arm at times. VSS during session.Pt left in modified chair position. Encourage blinds open during the day with pt placed in chair position daily. Will continue to follow acutely.   Follow Up Recommendations  LTACH;SNF;Supervision/Assistance - 24 hour    Equipment Recommendations  Wheelchair (measurements OT);Hospital bed;Wheelchair cushion (measurements OT);Other (comment) Michiel Sites)    Recommendations for Other Services      Precautions / Restrictions Precautions Precautions: Fall Precaution Comments: trach; peg Required Braces or Orthoses: Other Brace (B Prafo)       Mobility Bed Mobility               General bed mobility comments: total A +2  Transfers                      Balance                                            ADL either performed or assessed with clinical judgement   ADL                                         General ADL Comments: total A     Vision   Vision Assessment?: Vision impaired- to be further tested in functional context Additional Comments: conjugate gaze; able to sustain visual attention for short periods of time @ 10 seconds   Perception     Praxis      Cognition Arousal/Alertness: Awake/alert Behavior During Therapy: Flat affect Overall Cognitive Status: Impaired/Different from baseline Area of Impairment: Attention;Following commands;Awareness;Problem solving                   Current Attention Level: Focused   Following Commands: Follows one step commands inconsistently;Follows one step commands with increased time     Problem Solving: Slow processing;Decreased initiation          Exercises Exercises: Other exercises Other Exercises Other Exercises: BUE PROM WFL; tone flutuates; appear to be moving spontaneously, R>L. At times appears to be moving R UE on command with delay Other Exercises: Increased alertness with music   Shoulder Instructions  General Comments      Pertinent Vitals/ Pain       Pain Assessment: Faces Faces Pain Scale: Hurts even more Pain Location: grimaces with painful stimuli. L> R Pain Descriptors / Indicators:  (withdrawal)  Home Living                                          Prior Functioning/Environment              Frequency  Min 2X/week        Progress Toward Goals  OT Goals(current goals can now be found in the care plan section)  Progress towards OT goals: Progressing toward goals (goals remain appropriate)  Acute Rehab OT Goals Patient Stated Goal: unable OT Goal Formulation: Patient unable to participate in goal setting Time For Goal Achievement: 05/01/20 Potential to Achieve Goals: Fair ADL  Goals Pt Will Perform Grooming: with mod assist;bed level Additional ADL Goal #1: Pt to demonstrate ability to follow one step commands 40% of the time to increase ADL participation Additional ADL Goal #2: Pt/caregiver to demonstrate appropriate positioning to prevent contracture formation and maintain skin integrity.  Plan Discharge plan remains appropriate    Co-evaluation                 AM-PAC OT "6 Clicks" Daily Activity     Outcome Measure   Help from another person eating meals?: Total Help from another person taking care of personal grooming?: Total Help from another person toileting, which includes using toliet, bedpan, or urinal?: Total Help from another person bathing (including washing, rinsing, drying)?: Total Help from another person to put on and taking off regular upper body clothing?: Total Help from another person to put on and taking off regular lower body clothing?: Total 6 Click Score: 6    End of Session Equipment Utilized During Treatment: Oxygen (TC)  OT Visit Diagnosis: Muscle weakness (generalized) (M62.81);Other abnormalities of gait and mobility (R26.89);Apraxia (R48.2);Ataxia, unspecified (R27.0);Feeding difficulties (R63.3);Other symptoms and signs involving cognitive function;Other symptoms and signs involving the nervous system (R29.898)   Activity Tolerance Patient tolerated treatment well   Patient Left in bed;with call bell/phone within reach (modified chair position)   Nurse Communication Mobility status        Time: 2440-1027 OT Time Calculation (min): 27 min  Charges: OT General Charges $OT Visit: 1 Visit OT Treatments $Therapeutic Activity: 8-22 mins $Neuromuscular Re-education: 8-22 mins  Luisa Dago, OT/L   Acute OT Clinical Specialist Acute Rehabilitation Services Pager [phone] Office [phone]    [facility] 04/16/2020, 2:31 PM

## 2020-04-16 NOTE — Plan of Care (Signed)
  Problem: Nutrition: Goal: Adequate nutrition will be maintained Outcome: Progressing   Problem: Education: Goal: Knowledge of General Education information will improve Description: Including pain rating scale, medication(s)/side effects and non-pharmacologic comfort measures Outcome: Not Progressing   

## 2020-04-16 NOTE — Progress Notes (Signed)
Patient ID: Vickie Alvarado, female   DOB: 1966-01-13, 54 y.o.   MRN: [id number]  This NP visited patient at the bedside as a follow up for palliative medicine needs and emotional support  Discussed case with treatment team, reviewed medical records and daily labs.  Patient is minimally responsive, however there is evidence of movement in her right leg.   Created space and opportunity for family to explore their thoughts and feelings regarding current medical situation.  Family is very optimistic and are seeing "better improvement" and other movements as well.    They are open to all offered and available medical interventions to prolong life.  " we are a strong family"  Both express appreciation for the care patient is receiving here.   Discussed with family  the importance of continued conversation with each other  and the  medical providers regarding overall plan of care and treatment options,  ensuring decisions are within the context of the patients values and GOCs.  Questions and concerns addressed   Discussed with Dr Caleb Popp  Total time spent on the unit was 15 minutes  Greater than 50% of the time was spent in counseling and coordination of care  Lorinda Creed NP  Palliative Medicine Team Team Phone # [phone] Pager [phone]

## 2020-04-16 NOTE — Plan of Care (Signed)
  Problem: Education: Goal: Knowledge of General Education information will improve Description: Including pain rating scale, medication(s)/side effects and non-pharmacologic comfort measures Outcome: Not Progressing   

## 2020-04-16 NOTE — Consult Note (Addendum)
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 The wound has significantly declined to an unstageable. Inner wound is 100% eschar with red moist wound edges.  Moderate amount of tan and pink drainage with no odor.  Measures 7 cm x 5 cm x 0.1 cm Dressing procedure/placement/frequency: PT to begin Hydrotherapy on 04/16/20. Apply Santyl to sacrum in a nickel thick layer. Cover with a saline moistened gauze, then dry gauze or ABD pad.  Change daily. PT to change dressing every M-Sa after hydrotherapy.   BEDSIDE NURSE TO CHANGE DRESSING EVERY Sunday.  WOC nurses will continue to assess wound every week.   [name] [name], MSN, RN, CMSRN, Angus Seller, [facility] Wound Treatment Associate Office [phone] Pager [phone]

## 2020-04-16 NOTE — Progress Notes (Addendum)
[facility]  PROGRESS NOTE  Vickie Alvarado NLG:[id number] DOB: 09-23-1965 DOA: 03/20/2020 PCP: [facility] Admit date - 03/20/2020   Admitting Physician Jacky Kindle, MD  Outpatient Primary MD for the patient is [place], [facility]  LOS - 27 Brief Narrative   Vickie Alvarado is a 54 y.o. 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  She underwent stereotactic biopsy and aspiration of the abscess by neurosurgery on 03/22/2020 and was transferred to ICU.    Hospital course complicated by worsening mental status requiring MRI which showed new large left PCA infarct along with scattered infarcts in the right basal ganglia and right thalamus concerning for either CVA related to septic emboli versus herniation syndrome.  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 Given her large hemisphere brain abscess with surrounding edema/cerebritis neurosurgery did not recommend decompressive surger  Underwent  PEG placement on 9/9 with tube feeds starting at that time.  CT head shows improvement in fluid collections shows persistent left parietal/occipital acute/subacute infarcts with improvement in midline shift.  She has completed Decadron taper    Subjective   Underwent MRI brain yesterday morning which shows decrease in size of left basal ganglia abscess, but does show new enhancement along the left frontal aspiration tract.  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  Right maxillary sinus inflammation, mild sinus disease-noted on MRI brain 9/20 Status post stereotactic biopsy/aspiration by neurosurgery on 03/22/2020.  MRI brain on 9/20 shows decrease in size of left basal ganglia abscess with no enhancement on the left frontal aspiration tract.  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  Neurologic exam is stable (opens eyes to voice only).  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  No new infarcts on MRI brain -Continue scheduling of offloading of pressure  Abdominal distention, stable.  Had BM yesterday, last x-ray showed no signs of obstruction but still distended consistent with colonic inertia.ileus. Given the location will hold off on further enemas and focus on promotility agents.  -Add sorbitol again -, continue scheduled MiraLAX, senna docusate, monitor output -Monitor Serial abdominal xr  Acute hypoxic respiratory failure, status post trach 04/01/2020, stable 4-5 L of O2 on trach collar. Trach #8 and cuffed - will need to be cuffless prior to discharge,discussed with PCCM who will follow tomorrow for trach support  HTN, stable -Continue Amlodipine 5 mg  Nutrition -Continue Prosource via feeding tube  GERD, stable -continue PPI per tube  Type 2 diabetes, poorly controlled A1c 11.5.  CBG at goal currently -Lantus 20 units daily, NovoLog 3 units every 4 hours while on tube feeds, CBG every 4 h  -Continue IV Rocephin, end date 05/17/2020 for a total of 8 weeks as recommended by ID  Anemia of chronic disease.  Folate levels wnl adequate iron/ferritin -Continue folate supplementation, monitor hemoglobin  History of bipolar disorder, stable -Not on home Seroquel here  Pressure ulcer in gluteal cleft/sacral area,  consistent with stage 2 on my exam (no full loss of dermis, no fat exposure) difficult to promote healing given patient is incontinent of stool -Wound consulted, follow recommendations for cleaning, and foam dressing to sacrum every day    Family Communication  : updated daughter Laurence Spates on phone on 9/20, will update today as well  Code Status : Full  Disposition Plan  :  Patient is from home. Anticipated d/c date: >3 days. Barriers to d/c or necessity for inpatient status:  Currently optimizing bowel regimen as patient has significant stool burden need to avoid obstruction in the colonic  region, will need trach downsized and cuffless before discharge, will discuss with PCCM Consults  : Neurosurgery, ID, PCCM, IR  Procedures  :    DVT Prophylaxis  : Heparin  Lab Results  Component Value Date   PLT 192 04/13/2020    Diet :  Diet Order            Diet NPO time specified  Diet effective midnight                  Inpatient Medications Scheduled Meds: . amLODipine  5 mg Per Tube Daily  . chlorhexidine gluconate (MEDLINE KIT)  15 mL Mouth Rinse BID  . Chlorhexidine Gluconate Cloth  6 each Topical Daily  . collagenase   Topical Daily  . docusate  100 mg Per Tube BID  . feeding supplement (PROSource TF)  45 mL Per Tube TID  . folic acid  1 mg Per Tube Daily  . heparin injection (subcutaneous)  5,000 Units Subcutaneous Q8H  . insulin aspart  0-20 Units Subcutaneous Q4H  . insulin aspart  3 Units Subcutaneous Q4H  . insulin glargine  20 Units Subcutaneous Daily  . levETIRAcetam  500 mg Per Tube BID  . mouth rinse  15 mL Mouth Rinse 10 times per day  . pantoprazole sodium  40 mg Per Tube QHS  . polyethylene glycol  17 g Per Tube BID  . sodium chloride flush  10-40 mL Intracatheter Q12H  . sorbitol, milk of mag, mineral oil, glycerin (SMOG) enema  960 mL Rectal Once   Continuous Infusions: . cefTRIAXone (ROCEPHIN)  IV 2 g (04/16/20 0852)  . dextrose 5% lactated ringers 30 mL/hr at 04/15/20 1935  .  feeding supplement (JEVITY 1.5 CAL/FIBER) 1,000 mL (04/16/20 0859)   PRN Meds:.acetaminophen (TYLENOL) oral liquid 160 mg/5 mL, acetaminophen **OR** acetaminophen, fentaNYL (SUBLIMAZE) injection, hydrALAZINE, HYDROcodone-acetaminophen, labetalol, ondansetron (ZOFRAN) IV, polyethylene glycol, promethazine, sodium chloride flush  Antibiotics  :   Anti-infectives (From admission, onward)   Start     Dose/Rate Route Frequency Ordered Stop   04/14/20 1715  fluconazole (DIFLUCAN) tablet 150 mg        150 mg Per Tube  Once 04/14/20 1614 04/14/20 1813   04/04/20 1000   ceFAZolin (ANCEF) IVPB 2g/100 mL premix        2 g 200 mL/hr over 30 Minutes Intravenous To Radiology 04/04/20 0935 04/04/20 1053   04/03/20 0600  ceFAZolin (ANCEF) IVPB 1 g/50 mL premix        1 g 100 mL/hr over 30 Minutes Intravenous To Radiology 04/01/20 1239 04/04/20 0600   03/24/20 1030  vancomycin (VANCOREADY) IVPB 1500 mg/300 mL  Status:  Discontinued        1,500 mg 150 mL/hr over 120 Minutes Intravenous Every 8 hours 03/24/20 0952 03/24/20 1336   03/23/20 0130  vancomycin (VANCOCIN) IVPB 1000 mg/200 mL premix  Status:  Discontinued        1,000 mg 200 mL/hr over 60 Minutes Intravenous Every 8 hours 03/22/20 1647 03/24/20 0952   03/22/20 2200  cefTRIAXone (ROCEPHIN) 2 g in sodium chloride 0.9 % 100 mL IVPB        2 g 200 mL/hr over 30 Minutes Intravenous Every 12 hours 03/22/20 1617     03/22/20 1730  vancomycin (VANCOREADY) IVPB 1750 mg/350 mL        1,750 mg 175 mL/hr over 120 Minutes Intravenous  Once 03/22/20 1647 03/22/20 2030   03/22/20 1630  metroNIDAZOLE (FLAGYL) IVPB 500 mg  Status:  Discontinued        500 mg 100 mL/hr over 60 Minutes Intravenous Every 8 hours 03/22/20 1618 03/24/20 1350   03/22/20 1336  bacitracin 50,000 Units in sodium chloride 0.9 % 500 mL irrigation  Status:  Discontinued          As needed 03/22/20 1336 03/22/20 1341   03/22/20 1315  vancomycin (VANCOCIN) IVPB 1000 mg/200 mL premix  Status:  Discontinued        1,000 mg 200 mL/hr over 60 Minutes Intravenous To Surgery 03/22/20 1313 03/22/20 1545   03/22/20 1315  ceFEPIme (MAXIPIME) 2 g in sodium chloride 0.9 % 100 mL IVPB        2 g 200 mL/hr over 30 Minutes Intravenous To Surgery 03/22/20 1313 03/22/20 1354   03/22/20 1145  ceFAZolin (ANCEF) IVPB 2g/100 mL premix  Status:  Discontinued        2 g 200 mL/hr over 30 Minutes Intravenous  Once 03/22/20 1131 03/22/20 1545   03/22/20 1138  ceFAZolin (ANCEF) 2-4 GM/100ML-% IVPB       Note to Pharmacy: Gleason, Ginger   : cabinet override       03/22/20 1138 03/22/20 2344       Objective   Vitals:   04/16/20 0739 04/16/20 0743 04/16/20 1115 04/16/20 1141  BP: 121/70 128/75 119/68 129/73  Pulse: 84 84 93 92  Resp: 14 17 18  (!) 25  Temp:  98.3 F (36.8 C)  97.9 F (36.6 C)  TempSrc:  Axillary  Axillary  SpO2: 100% 98% 100% 94%  Weight:      Height:        SpO2: 94 % O2 Flow Rate (  L/min): 5 L/min FiO2 (%): 28 %  Wt Readings from Last 3 Encounters:  04/15/20 72.2 kg  03/01/20 88 kg     Intake/Output Summary (Last 24 hours) at 04/16/2020 1418 Last data filed at 04/16/2020 0600 Gross per 24 hour  Intake 4030 ml  Output 300 ml  Net 3730 ml    Physical Exam:  Opens eyes , does not track, not following commands, stable neurologic exam Lying comfortably, in no distress Normal respiratory effort, normal breath sounds, tracheostomy in place on 4 L  Abdomen soft but distended, normal bowel sounds. Feeding tube in place      I have personally reviewed the following:   Data Reviewed:  CBC Recent Labs  Lab 04/13/20 0500  WBC 5.9  HGB 10.6*  HCT 33.2*  PLT 192  MCV 92.5  MCH 29.5  MCHC 31.9  RDW 17.2*    Chemistries  Recent Labs  Lab 04/13/20 0500  NA 137  K 3.5  CL 102  CO2 29  GLUCOSE 118*  BUN 8  CREATININE 0.39*  CALCIUM 8.5*  AST 23  ALT 83*  ALKPHOS 93  BILITOT 0.1*   ------------------------------------------------------------------------------------------------------------------ No results for input(s): CHOL, HDL, LDLCALC, TRIG, CHOLHDL, LDLDIRECT in the last 72 hours.  Lab Results  Component Value Date   HGBA1C 11.5 (H) 03/22/2020   ------------------------------------------------------------------------------------------------------------------ No results for input(s): TSH, T4TOTAL, T3FREE, THYROIDAB in the last 72 hours.  Invalid input(s): FREET3 ------------------------------------------------------------------------------------------------------------------ No  results for input(s): VITAMINB12, FOLATE, FERRITIN, TIBC, IRON, RETICCTPCT in the last 72 hours.  Coagulation profile No results for input(s): INR, PROTIME in the last 168 hours.  No results for input(s): DDIMER in the last 72 hours.  Cardiac Enzymes No results for input(s): CKMB, TROPONINI, MYOGLOBIN in the last 168 hours.  Invalid input(s): CK ------------------------------------------------------------------------------------------------------------------ No results found for: BNP  Micro Results No results found for this or any previous visit (from the past 240 hour(s)).  Radiology Reports CT Angio Head W/Cm &/Or Wo Cm  Result Date: 03/20/2020 CLINICAL DATA:  Acute neuro deficit with right-sided weakness. EXAM: CT ANGIOGRAPHY HEAD AND NECK TECHNIQUE: Multidetector CT imaging of the head and neck was performed using the standard protocol during bolus administration of intravenous contrast. Multiplanar CT image reconstructions and MIPs were obtained to evaluate the vascular anatomy. Carotid stenosis measurements (when applicable) are obtained utilizing NASCET criteria, using the distal internal carotid diameter as the denominator. CONTRAST:  56m OMNIPAQUE IOHEXOL 350 MG/ML SOLN COMPARISON:  None. FINDINGS: CT HEAD FINDINGS Brain: Large mass lesion in the left frontal lobe. Delayed imaging reveals irregular enhancement of the periphery with central necrosis. The enhancing component of the mass measures approximately 53 x 28 mm. Surrounding low-density edema/tumor seen around the enhancement. Low-density extends into the left basal ganglia. There is compression of the left lateral ventricle and 7 mm midline shift to the right. No acute hemorrhage. Negative for hydrocephalus Vascular: Negative for hyperdense vessel Skull: Negative Sinuses: Complete opacification right maxillary sinus with bony thickening. Mucosal edema extends into the right frontal and right ethmoid sinus. Remaining sinuses  clear. Mastoid clear bilaterally. Orbits: Negative Review of the MIP images confirms the above findings CTA NECK FINDINGS Aortic arch: Normal aortic arch. Azygos branching pattern. Proximal great vessels widely patent. Right carotid system: Right carotid widely patent. Mild noncalcified plaque in the right carotid bulb. Left carotid system: Left carotid widely patent. No significant stenosis or atherosclerotic disease. Vertebral arteries: Both vertebral arteries are widely patent to the basilar without stenosis. Skeleton:  Disc degeneration and spurring C5-6 and C6-7. No acute skeletal abnormality. Poor dentition. Other neck: Negative for mass or adenopathy. Upper chest: Lung apices clear bilaterally Review of the MIP images confirms the above findings CTA HEAD FINDINGS Anterior circulation: Mild atherosclerotic calcification in the cavernous carotid without stenosis. Right middle cerebral artery widely patent. Hypoplastic right A1 segment. Azygos anterior cerebral artery arising from the left without significant stenosis. There is displacement and stretching of the left MCA branches due to the large left frontal mass. Posterior circulation: Both vertebral arteries patent to the basilar. PICA patent bilaterally. Basilar widely patent. AICA, superior cerebellar, and posterior cerebral arteries patent bilaterally without stenosis. Venous sinuses: Normal venous enhancement Anatomic variants: None Review of the MIP images confirms the above findings IMPRESSION: 1. 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 2. Negative for acute infarct or hemorrhage 3. No significant carotid or vertebral artery stenosis in the neck. 4. Negative for intracranial large vessel occlusion. Electronically Signed   By: Franchot Gallo M.D.   On: 03/20/2020 13:29   DG Abd 1 View  Result Date: 04/11/2020 CLINICAL DATA:  Abdominal distension. EXAM: ABDOMEN - 1 VIEW COMPARISON:  04/10/2020 FINDINGS:  Gastrostomy projects over the left upper abdomen. Nonobstructive bowel gas pattern. Moderate stool in the right colon. Gas within the sigmoid colon with decreased distention since prior study. No organomegaly or free air. IMPRESSION: Continued large stool burden in the right colon. No evidence of bowel obstruction. Electronically Signed   By: Rolm Baptise M.D.   On: 04/11/2020 09:26   DG Abd 1 View  Result Date: 04/10/2020 CLINICAL DATA:  Abdominal distension EXAM: ABDOMEN - 1 VIEW COMPARISON:  CT 12/19/2019 FINDINGS: Large proximal colonic stool burden with air distention of the distal colonic segments and some mild thickening of the haustra distally. An air distended loop seen in the left lower quadrant is present with lack of air and stool over the rectal vault. Percutaneous feeding tube projects over left upper abdomen. Atelectatic changes in the lung bases. No suspicious calcifications. Mild degenerative changes in the spine and pelvis. IMPRESSION: 1. Large proximal colonic stool burden with air distention of the distal colonic segments and some suspect thickening of the haustra distally. Air distended loop seen in the left lower quadrant may reflect a distended sigmoid with lack of air and stool over the rectal vault. Features could reflect constipation, though early distal colonic obstruction/sigmoid volvulus could present with a similar appearance. 2. Percutaneous gastrostomy in the left upper quadrant. Electronically Signed   By: Lovena Le M.D.   On: 04/10/2020 15:57   CT HEAD WO CONTRAST  Result Date: 04/08/2020 CLINICAL DATA:  Stroke. Cerebritis. Cerebral abscess. Left-sided ptosis. EXAM: CT HEAD WITHOUT CONTRAST TECHNIQUE: Contiguous axial images were obtained from the base of the skull through the vertex without intravenous contrast. COMPARISON:  MR head without and with contrast 03/24/2020. CT head without contrast 03/26/2020. FINDINGS: Brain: Previously noted fluid collections in the left  operculum have significantly improved. No fluid levels or gas collections are present. Attenuation of the left lentiform nucleus is restored. Persistent hypoattenuation is present throughout the left parietal and occipital white matter. Left occipital cortical infarcts are noted. Midline shift of 2-3 mm is improved. Vascular: Atherosclerotic calcifications are present within the cavernous internal carotid arteries bilaterally. No hyperdense vessel is present. Skull: Left frontal burr hole is noted. Calvarium is otherwise intact. No significant extracranial soft tissue lesion is present. Sinuses/Orbits: Chronic right maxillary sinus specification is  present. Bilateral mastoid effusions are present, left greater than right. Middle ear cavity is clear. No obstruction is present. Sinuses are otherwise clear. The globes and orbits are within normal limits. IMPRESSION: 1. Significantly improved fluid collections in the left operculum. No fluid levels or gas collections are present. 2. Persistent hypoattenuation throughout the left parietal and occipital white matter compatible with acute/subacute infarcts. 3. Left occipital cortical infarcts. 4. Improved midline shift of 2-3 mm. 5. Bilateral mastoid effusions, left greater than right. No obstruction is present. 6. Chronic right maxillary sinus disease. Electronically Signed   By: San Morelle M.D.   On: 04/08/2020 14:48   CT HEAD WO CONTRAST  Result Date: 03/26/2020 CLINICAL DATA:  Cerebral edema.  Status post biopsy. EXAM: CT HEAD WITHOUT CONTRAST TECHNIQUE: Contiguous axial images were obtained from the base of the skull through the vertex without intravenous contrast. COMPARISON:  None. FINDINGS: Brain: Decreased pneumocephalus at the left frontal biopsy site. The degree of edema throughout the left hemisphere has improved slightly. But, there is a new left posterior cerebral artery territory infarct. Rightward midline shift measures 6 mm at the level of  the foramina of [place]. There is improved patency of the basal cisterns. No hydrocephalus. Vascular: No hyperdense vessel or unexpected calcification. Skull: Normal. Negative for fracture or focal lesion. Sinuses/Orbits: Complete opacification of the right frontal and maxillary sinuses. Normal orbits. Other: None IMPRESSION: 1. Slightly improved edema throughout the left hemisphere with 6 mm rightward midline shift. Improved patency of the basal cisterns. 2. Left posterior cerebral artery territory infarct. Electronically Signed   By: Ulyses Jarred M.D.   On: 03/26/2020 01:51   CT HEAD WO CONTRAST  Result Date: 03/22/2020 CLINICAL DATA:  Mental status change. Recent diagnosis of brain lesion post biopsy. EXAM: CT HEAD WITHOUT CONTRAST TECHNIQUE: Contiguous axial images were obtained from the base of the skull through the vertex without intravenous contrast. COMPARISON:  CT 8 hours ago.  Brain MRI earlier today. FINDINGS: Brain: Stable size and appearance of the left brain lesion centered in the basal ganglia. Stable internal air or Gel-Foam. Stable regional mass effect. Left-to-right midline shift of 9 mm, previously 11 mm. No evidence of lesional or acute hemorrhage. Stable sulcal effacement. Lacunar infarct in the right thalamus better appreciated on prior MRI. Stable ventricular size. Basilar cisterns are patent. No developing subdural collection. Vascular: No hyperdense vessel. Skull: Left frontal burr hole.  No fracture or focal lesion. Sinuses/Orbits: Unchanged appearance of right maxillary and frontal sinusitis with opacification of anterior ethmoid air cells. Other: Post biopsy changes in the left frontal scalp. IMPRESSION: 1. Stable size and appearance of the left brain lesion centered in the basal ganglia with internal air or Gel-Foam. Stable regional mass effect. Slightly diminished left-to-right midline shift of 9 mm, previously 11 mm. 2. No evidence of hemorrhage or new abnormality. Electronically  Signed   By: Keith Rake M.D.   On: 03/22/2020 23:33   CT HEAD WO CONTRAST  Result Date: 03/22/2020 CLINICAL DATA:  Follow-up brain biopsy. EXAM: CT HEAD WITHOUT CONTRAST TECHNIQUE: Contiguous axial images were obtained from the base of the skull through the vertex without intravenous contrast. COMPARISON:  MRI earlier same day.  CT yesterday. FINDINGS: Brain: Left frontal burr hole. Biopsy site with air or Gel-Foam in the region of the previous tumor with the epicenter in the left basal ganglia. No evidence of postsurgical hemorrhage. No change in regional mass effect with left-to-right shift 11 mm. No extra-axial collection. Vascular: No abnormal  vascular finding. Skull: Otherwise negative Sinuses/Orbits: Ostiomeatal unit pattern of the paranasal sinuses on the right. Other: None IMPRESSION: Biopsy site with air or Gel-Foam in the region of the previous tumor with the epicenter in the left basal ganglia. No evidence of postsurgical hemorrhage. No change in regional mass effect with left-to-right shift 11 mm. Electronically Signed   By: Nelson Chimes M.D.   On: 03/22/2020 15:58   CT HEAD WO CONTRAST  Result Date: 03/21/2020 CLINICAL DATA:  Brain mass. EXAM: CT HEAD WITHOUT CONTRAST TECHNIQUE: Contiguous axial images were obtained from the base of the skull through the vertex without intravenous contrast. COMPARISON:  Brain MRI from yesterday FINDINGS: Brain: Known mass with central low-density and extensive hemispheric low-density and swelling centered in the deep left cerebrum. Dimensions were better assessed on postcontrast imaging from yesterday. No interval hemorrhage or herniation. Midline shift measures 1 cm without entrapment. Vascular: Negative Skull: Negative Sinuses/Orbits: Chronic right-sided sinusitis with obstruction at the level of the middle meatus. Other: Motion artifact IMPRESSION: 1. BrainLAB protocol but motion artifact at the vertex and skull base due to confusion. If not adequate  for intraoperative localization a sedated scan may be required. 2. No new findings when compared to preceding brain MRI. Electronically Signed   By: Monte Fantasia M.D.   On: 03/21/2020 11:32   CT Angio Neck W and/or Wo Contrast  Result Date: 03/20/2020 CLINICAL DATA:  Acute neuro deficit with right-sided weakness. EXAM: CT ANGIOGRAPHY HEAD AND NECK TECHNIQUE: Multidetector CT imaging of the head and neck was performed using the standard protocol during bolus administration of intravenous contrast. Multiplanar CT image reconstructions and MIPs were obtained to evaluate the vascular anatomy. Carotid stenosis measurements (when applicable) are obtained utilizing NASCET criteria, using the distal internal carotid diameter as the denominator. CONTRAST:  64m OMNIPAQUE IOHEXOL 350 MG/ML SOLN COMPARISON:  None. FINDINGS: CT HEAD FINDINGS Brain: Large mass lesion in the left frontal lobe. Delayed imaging reveals irregular enhancement of the periphery with central necrosis. The enhancing component of the mass measures approximately 53 x 28 mm. Surrounding low-density edema/tumor seen around the enhancement. Low-density extends into the left basal ganglia. There is compression of the left lateral ventricle and 7 mm midline shift to the right. No acute hemorrhage. Negative for hydrocephalus Vascular: Negative for hyperdense vessel Skull: Negative Sinuses: Complete opacification right maxillary sinus with bony thickening. Mucosal edema extends into the right frontal and right ethmoid sinus. Remaining sinuses clear. Mastoid clear bilaterally. Orbits: Negative Review of the MIP images confirms the above findings CTA NECK FINDINGS Aortic arch: Normal aortic arch. Azygos branching pattern. Proximal great vessels widely patent. Right carotid system: Right carotid widely patent. Mild noncalcified plaque in the right carotid bulb. Left carotid system: Left carotid widely patent. No significant stenosis or atherosclerotic  disease. Vertebral arteries: Both vertebral arteries are widely patent to the basilar without stenosis. Skeleton: Disc degeneration and spurring C5-6 and C6-7. No acute skeletal abnormality. Poor dentition. Other neck: Negative for mass or adenopathy. Upper chest: Lung apices clear bilaterally Review of the MIP images confirms the above findings CTA HEAD FINDINGS Anterior circulation: Mild atherosclerotic calcification in the cavernous carotid without stenosis. Right middle cerebral artery widely patent. Hypoplastic right A1 segment. Azygos anterior cerebral artery arising from the left without significant stenosis. There is displacement and stretching of the left MCA branches due to the large left frontal mass. Posterior circulation: Both vertebral arteries patent to the basilar. PICA patent bilaterally. Basilar widely patent. AICA, superior cerebellar, and  posterior cerebral arteries patent bilaterally without stenosis. Venous sinuses: Normal venous enhancement Anatomic variants: None Review of the MIP images confirms the above findings IMPRESSION: 1. 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 2. Negative for acute infarct or hemorrhage 3. No significant carotid or vertebral artery stenosis in the neck. 4. Negative for intracranial large vessel occlusion. Electronically Signed   By: Franchot Gallo M.D.   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 EXAM: MRI HEAD WITHOUT AND WITH CONTRAST MRA HEAD WITHOUT CONTRAST TECHNIQUE: Multiplanar, multiecho pulse sequences of the brain and surrounding structures were obtained without and with intravenous contrast. Angiographic images of the head were obtained using MRA technique without contrast. CONTRAST:  79m GADAVIST GADOBUTROL 1 MMOL/ML IV SOLN COMPARISON:  Prior MRI from 03/22/2020 and  03/20/2020. FINDINGS: MRI HEAD FINDINGS Brain: Postoperative changes from interval stereotactic aspiration of previously identified lesion centered at the left basal ganglia is seen. Aspiration tract with a small amount of susceptibility artifacts seen extending via a left frontal approach. The lesion at the left basal ganglia is slightly decreased in size now measuring 5.1 x 2.4 x 2.7 cm, previously 6.0 x 2.4 x 3.5 cm. Associated internal fluid-fluid level with restricted diffusion. Small focus of pneumocephalus at the nondependent portion of this lesion. Per history, this lesion was found to be most consistent with an intracerebral infection/abscess. Persistent surrounding vasogenic edema throughout the adjacent left cerebral hemisphere with regional mass effect and effacement of the left lateral ventricle. Edema extends into the left cerebral peduncle and left midbrain, similar to previous. Associated left-to-right shift has worsened now measuring up to 10 mm, previously 6 mm. No overt hydrocephalus or ventricular trapping at this time. Crowding of the basilar cisterns with mass effect on the adjacent midbrain, similar to slightly worsened. No transtentorial herniation. 1 cm right thalamic infarct is similar to previous. No associated hemorrhage. Interval development of confluent restricted diffusion throughout the left temporal occipital region, left PCA distribution, consistent with acute ischemia. No associated hemorrhage. Multiple additional new patchy ischemic infarcts involving the bilateral thalami, splenium, parasagittal right temporal occipital region, and left dorsal pons. Right thalamic ischemic changes extend into the right midbrain. These infarcts involve the posterior circulation. An additional curvilinear infarct extending from the right lentiform nucleus towards the right caudate noted as well, favored to be anterior distribution (series 6, image 76). Patchy restricted diffusion also seen in the  region of the left hypothalamus (series 6, image 69). Additional small cortical infarct noted at the posterior right frontotemporal region (series 6, image 80). Associated mild petechial hemorrhage at the left dorsal pons without hemorrhagic transformation. Findings favored to be embolic in nature. Mild left a meningeal enhancement noted involving the parasagittal left occipital region (series 29, image 7). While this finding could be reactive in nature related to adjacent left PCA territory infarct, a degree of underlying left a meningeal meningitis related to infection could also be considered. Pituitary stalk is thickened and enhancing as well. Vascular: Major intracranial vascular flow voids are maintained. Normal opacification seen throughout the major dural sinuses following contrast administration. Skull and upper cervical spine: Craniocervical junction within normal limits. No transtentorial herniation. Bone marrow signal intensity normal. No scalp soft tissue abnormality. Sinuses/Orbits: Globes and orbital soft tissues within normal limits. Extensive paranasal sinus disease involving the right frontoethmoidal and right maxillary sinuses noted. Fluid seen  within the nasopharynx. Small bilateral mastoid effusions. Patient is intubated. Other: None. MRA HEAD FINDINGS ANTERIOR CIRCULATION: Examination degraded by motion artifact. Visualized distal cervical segments of the internal carotid arteries are patent with symmetric antegrade flow. Petrous, cavernous, and supraclinoid ICAs patent without stenosis or other abnormality. Left A1 patent. Right A1 hypoplastic and/or absent, accounting for the diminutive right ICA is compared to the left. Widely patent azygos ACA. No M1 stenosis or occlusion. Normal MCA bifurcations. Distal MCA branches well perfused and symmetric. POSTERIOR CIRCULATION: Vertebral arteries patent to the vertebrobasilar junction without stenosis. Right PICA patent. Left PICA not seen. Basilar  mildly irregular but patent to its distal aspect without high-grade stenosis. Superior cerebral arteries patent bilaterally. Both PCAs primarily supplied via the basilar. Irregularity about the PCAs bilaterally with associated mild to moderate stenoses, greater on the left. Specific note made of a moderate left P2 stenosis (series 28, image 14) PCAs remain well perfused to their distal aspects. No aneurysm. IMPRESSION: MRI HEAD IMPRESSION: 1. Postoperative changes from stereotactic aspiration/drainage of left basal ganglia lesion without complication. Lesion is decreased in size now measuring 5.1 x 2.4 x 2.7 cm. Per history, this is consistent with and intracerebral abscess. 2. Persistent associated vasogenic edema throughout the left cerebral hemisphere with mildly worsened 10 mm left-to-right shift. 3. Interval development of multifocal predominantly posterior circulation infarcts, including a large left PCA territory infarct. Minimal petechial hemorrhage at the left pons without hemorrhagic transformation. A central thromboembolic source is suspected. 4. Patchy leptomeningeal enhancement within the posterior left occipital region. While this may be reactive to the adjacent left PCA territory infarct, a degree of leptomeningeal meningitis related to intracerebral infection could also be considered. 5. Extensive right-sided paranasal sinus disease. MRA HEAD IMPRESSION: 1. Negative intracranial MRA for large vessel occlusion. 2. Scattered vascular irregularity involving the basilar and bilateral PCAs without occlusion. No proximal high-grade or correctable stenosis. No aneurysm. Findings discussed by called by telephone on 03/24/2020 at approximately 5:30 am with provider Dr. Rory Percy. Electronically Signed   By: Jeannine Boga M.D.   On: 03/24/2020 06:44   MR BRAIN W WO CONTRAST  Result Date: 04/15/2020 CLINICAL DATA:  Brain abscess EXAM: MRI HEAD WITHOUT AND WITH CONTRAST TECHNIQUE: Multiplanar, multiecho  pulse sequences of the brain and surrounding structures were obtained without and with intravenous contrast. CONTRAST:  67m GADAVIST GADOBUTROL 1 MMOL/ML IV SOLN COMPARISON:  03/24/2020 MRI/MRA head and prior.  04/08/2020 head CT. FINDINGS: Brain: Decreased size of thick-walled peripherally enhancing left basal ganglia fluid collection with marked internal restricted diffusion measuring 2.8 x 1.5 cm (10: 26). Confluent white matter edema involving the left cerebrum is decreased from prior exam. Partial effacement of the left lateral ventricle without midline shift. No ventriculomegaly or extra-axial fluid collection. New enhancement is seen along the left frontal aspiration track. Subacute infarcts involving the peripheral right frontal lobe, bilateral thalami, dorsal corpus callosum and left midbrain/pons. Some of these insults are better demonstrated on prior exam. Serpiginous cortically based enhancement involving the left parietal, occipital and temporal lobes likely reflects cortical laminar necrosis, consistent with subacute insult. No new infarct. Vascular: Major intracranial flow voids are grossly preserved. Skull and upper cervical spine: Normal marrow signal. Sinuses/Orbits: Normal orbits. Mild frontal, ethmoid and right maxillary sinus mucosal thickening. Layering right maxillary secretions reflects active inflammation. Bilateral mastoid and petrous apex effusions. Other: None. 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 New enhancement is seen along the left frontal aspiration tract. Sequela of subacute infarcts  involving the right frontal lobe, bilateral thalami, corpus callosum and left midbrain/pons. Left parietooccipital and temporal cortical laminar necrosis, sequela of prior infarct. Bilateral mastoid and petrous apex effusions. Mild sinus disease with active right maxillary sinus inflammation. Electronically Signed   By: Primitivo Gauze M.D.   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 EXAM: MRI HEAD WITHOUT AND WITH CONTRAST MRA HEAD WITHOUT CONTRAST TECHNIQUE: Multiplanar, multiecho pulse sequences of the brain and surrounding structures were obtained without and with intravenous contrast. Angiographic images of the head were obtained using MRA technique without contrast. CONTRAST:  23m GADAVIST GADOBUTROL 1 MMOL/ML IV SOLN COMPARISON:  Prior MRI from 03/22/2020 and 03/20/2020. FINDINGS: MRI HEAD FINDINGS Brain: Postoperative changes from interval stereotactic aspiration of previously identified lesion centered at the left basal ganglia is seen. Aspiration tract with a small amount of susceptibility artifacts seen extending via a left frontal approach. The lesion at the left basal ganglia is slightly decreased in size now measuring 5.1 x 2.4 x 2.7 cm, previously 6.0 x 2.4 x 3.5 cm. Associated internal fluid-fluid level with restricted diffusion. Small focus of pneumocephalus at the nondependent portion of this lesion. Per history, this lesion was found to be most consistent with an intracerebral infection/abscess. Persistent surrounding vasogenic edema throughout the adjacent left cerebral hemisphere with regional mass effect and effacement of the left lateral ventricle. Edema extends into the left cerebral peduncle and left midbrain, similar to previous. Associated left-to-right shift has worsened now measuring up to 10 mm, previously 6 mm. No overt hydrocephalus or ventricular trapping at this time. Crowding of the basilar cisterns with mass effect on the adjacent midbrain, similar to slightly worsened. No transtentorial herniation. 1 cm right thalamic infarct is similar to previous. No associated hemorrhage. Interval development of confluent restricted diffusion throughout the left temporal occipital region, left PCA distribution,  consistent with acute ischemia. No associated hemorrhage. Multiple additional new patchy ischemic infarcts involving the bilateral thalami, splenium, parasagittal right temporal occipital region, and left dorsal pons. Right thalamic ischemic changes extend into the right midbrain. These infarcts involve the posterior circulation. An additional curvilinear infarct extending from the right lentiform nucleus towards the right caudate noted as well, favored to be anterior distribution (series 6, image 76). Patchy restricted diffusion also seen in the region of the left hypothalamus (series 6, image 69). Additional small cortical infarct noted at the posterior right frontotemporal region (series 6, image 80). Associated mild petechial hemorrhage at the left dorsal pons without hemorrhagic transformation. Findings favored to be embolic in nature. Mild left a meningeal enhancement noted involving the parasagittal left occipital region (series 29, image 7). While this finding could be reactive in nature related to adjacent left PCA territory infarct, a degree of underlying left a meningeal meningitis related to infection could also be considered. Pituitary stalk is thickened and enhancing as well. Vascular: Major intracranial vascular flow voids are maintained. Normal opacification seen throughout the major dural sinuses following contrast administration. Skull and upper cervical spine: Craniocervical junction within normal limits. No transtentorial herniation. Bone marrow signal intensity normal. No scalp soft tissue abnormality. Sinuses/Orbits: Globes and orbital soft tissues within normal limits. Extensive paranasal sinus disease involving the right frontoethmoidal and right maxillary sinuses noted. Fluid seen within the nasopharynx. Small bilateral mastoid effusions. Patient is intubated. Other: None. MRA HEAD FINDINGS ANTERIOR CIRCULATION: Examination degraded by motion artifact. Visualized distal  cervical segments of  the internal carotid arteries are patent with symmetric antegrade flow. Petrous, cavernous, and supraclinoid ICAs patent without stenosis or other abnormality. Left A1 patent. Right A1 hypoplastic and/or absent, accounting for the diminutive right ICA is compared to the left. Widely patent azygos ACA. No M1 stenosis or occlusion. Normal MCA bifurcations. Distal MCA branches well perfused and symmetric. POSTERIOR CIRCULATION: Vertebral arteries patent to the vertebrobasilar junction without stenosis. Right PICA patent. Left PICA not seen. Basilar mildly irregular but patent to its distal aspect without high-grade stenosis. Superior cerebral arteries patent bilaterally. Both PCAs primarily supplied via the basilar. Irregularity about the PCAs bilaterally with associated mild to moderate stenoses, greater on the left. Specific note made of a moderate left P2 stenosis (series 28, image 14) PCAs remain well perfused to their distal aspects. No aneurysm. IMPRESSION: MRI HEAD IMPRESSION: 1. Postoperative changes from stereotactic aspiration/drainage of left basal ganglia lesion without complication. Lesion is decreased in size now measuring 5.1 x 2.4 x 2.7 cm. Per history, this is consistent with and intracerebral abscess. 2. Persistent associated vasogenic edema throughout the left cerebral hemisphere with mildly worsened 10 mm left-to-right shift. 3. Interval development of multifocal predominantly posterior circulation infarcts, including a large left PCA territory infarct. Minimal petechial hemorrhage at the left pons without hemorrhagic transformation. A central thromboembolic source is suspected. 4. Patchy leptomeningeal enhancement within the posterior left occipital region. While this may be reactive to the adjacent left PCA territory infarct, a degree of leptomeningeal meningitis related to intracerebral infection could also be considered. 5. Extensive right-sided paranasal sinus disease. MRA HEAD IMPRESSION: 1.  Negative intracranial MRA for large vessel occlusion. 2. Scattered vascular irregularity involving the basilar and bilateral PCAs without occlusion. No proximal high-grade or correctable stenosis. No aneurysm. Findings discussed by called by telephone on 03/24/2020 at approximately 5:30 am with provider Dr. Rory Percy. Electronically Signed   By: Jeannine Boga M.D.   On: 03/24/2020 06:44   MR BRAIN W WO CONTRAST  Result Date: 03/22/2020 CLINICAL DATA:  Brain mass EXAM: MRI HEAD WITHOUT AND WITH CONTRAST TECHNIQUE: Multiplanar, multiecho pulse sequences of the brain and surrounding structures were obtained without and with intravenous contrast. CONTRAST:  58m GADAVIST GADOBUTROL 1 MMOL/ML IV SOLN COMPARISON:  03/20/2020 FINDINGS: Brain: When accounting for differences in technique (axial scan angle is slightly different), similar size of the mass centered within the left basal ganglia, with the enhancing portion measuring up to approximately 6.4 x 2.8 by 3.6 cm. Redemonstrated nodular peripheral enhancement with central necrosis. Similar exuberant surrounding T2/stir hyperintensity with marked effacement left lateral ventricle and approximately 9 mm of rightward midline shift at the foramina [place] No progressive ventriculomegaly. The mass restricts diffusion peripherally without central restricted diffusion. Additionally, there is a new area of restricted diffusion within the right thalamus, compatible with acute infarct (series 550, image 28). Mild associated T2/STIR hyperintensity. Vascular: Limited evaluation; however, major vessels at the base of the brain demonstrate normal flow voids. Skull and upper cervical spine: Negative Sinuses/Orbits: There is complete opacification of the right frontal, anterior ethmoid air cells, and right maxillary sinus, compatible with ostiomeatal unit pattern sinus disease. Other: Small left mastoid effusion. IMPRESSION: 1. New/acute infarct within the right thalamus. 2. When  accounting for differences in technique/scan angle, similar size/appearance of a large centrally necrotic mass centered in the left basal ganglia with extensive surrounding T2/STIR signal abnormality, primarily concerning for a high grade glioma. Resulting effacement of the left lateral ventricle and approximately 9 mm of  right midline shift, not substantially changed. No progressive ventriculomegaly. 3. Similar right-sided ostiomeatal unit pattern paranasal sinusitis. Critical Value/emergent results were called by telephone at the time of interpretation on 03/22/2020 at 10:35 am to provider Dr. Reatha Armour, who verbally acknowledged these results. Electronically Signed   By: Margaretha Sheffield MD   On: 03/22/2020 10:39   MR Brain W and Wo Contrast  Result Date: 03/20/2020 CLINICAL DATA:  Acute right-sided weakness. Brain tumor shown by CT angiography. EXAM: MRI HEAD WITHOUT AND WITH CONTRAST TECHNIQUE: Multiplanar, multiecho pulse sequences of the brain and surrounding structures were obtained without and with intravenous contrast. CONTRAST:  8.67m GADAVIST GADOBUTROL 1 MMOL/ML IV SOLN COMPARISON:  CT studies earlier same day FINDINGS: Brain: Approximately 6.5 X 4.5 x 4 cm tumor with the epicenter in the left basal ganglia and radiating white matter tracts region. Central necrosis. Contrast enhancement along the margins of the necrosis. The enhancing region measures 6 x 2.4 x 3.5 cm. There is mass effect with flattening of the left lateral ventricle. Left-to-right midline shift of 6 mm. No ventricular trapping. No satellite lesion identified. No second brain mass. No evidence of ischemic stroke. Vascular: Major vessels at the base of the brain show flow. Skull and upper cervical spine: Negative Sinuses/Orbits: Ostiomeatal unit pattern on the right with opacification of the right maxillary, ethmoid and frontal sinuses. Orbits negative. Other: None IMPRESSION: 6.5 x 4.5 x 4 cm tumor with the epicenter in the left basal  ganglia and radiating white matter tracts. Central necrosis. Contrast enhancement along the margins of the necrosis. The enhancing region measures 6 x 2.4 x 3.5 cm. There is mass effect with flattening of the left lateral ventricle. Left-to-right midline shift of 6 mm. No ventricular trapping. No satellite lesion identified. 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 Right-sided paranasal sinusitis with ostiomeatal unit pattern. Electronically Signed   By: MNelson ChimesM.D.   On: 03/20/2020 15:44   IR GASTROSTOMY TUBE MOD SED  Result Date: 04/04/2020 INDICATION: Dysphagia. Please perform percutaneous gastrostomy tube for enteric nutrition supplementation purposes. EXAM: PULL TROUGH GASTROSTOMY TUBE PLACEMENT COMPARISON:  CT abdomen and pelvis-03/20/2020 MEDICATIONS: Ancef 2 gm IV; Antibiotics were administered within 1 hour of the procedure. Glucagon 1 mg IV CONTRAST:  20 mL of Omnipaque 300 administered into the gastric lumen. ANESTHESIA/SEDATION: Moderate (conscious) sedation was employed during this procedure. A total of Versed 1 mg and Fentanyl 50 mcg was administered intravenously. Moderate Sedation Time: 20 minutes. The patient's level of consciousness and vital signs were monitored continuously by radiology nursing throughout the procedure under my direct supervision. FLUOROSCOPY TIME:  1 minutes, 12 seconds (5 mGy) COMPLICATIONS: None immediate. PROCEDURE: Informed written consent was obtained from the patient's family following explanation of the procedure, risks, benefits and alternatives. A time out was performed prior to the initiation of the procedure. Ultrasound scanning was performed to demarcate the edge of the left lobe of the liver. Maximal barrier sterile technique utilized including caps, mask, sterile gowns, sterile gloves, large sterile drape, hand hygiene and Betadine prep. The left upper quadrant was sterilely prepped and draped. An oral gastric catheter  was inserted into the stomach under fluoroscopy. The existing nasogastric feeding tube was removed. The left costal margin and air opacified transverse colon were identified and avoided. Air was injected into the stomach for insufflation and visualization under fluoroscopy. Under sterile conditions a 17 gauge trocar needle was utilized to access the stomach percutaneously beneath the left subcostal margin after the overlying  soft tissues were anesthetized with 1% Lidocaine with epinephrine. Needle position was confirmed within the stomach with aspiration of air and injection of small amount of contrast. A single T tack was deployed for gastropexy. Over an Amplatz guide wire, a 9-French sheath was inserted into the stomach. A snare device was utilized to capture the oral gastric catheter. The snare device was pulled retrograde from the stomach up the esophagus and out the oropharynx. The 20-French pull-through gastrostomy was connected to the snare device and pulled antegrade through the oropharynx down the esophagus into the stomach and then through the percutaneous tract external to the patient. The gastrostomy was assembled externally. Contrast injection confirms position in the stomach. Several spot radiographic images were obtained in various obliquities for documentation. The patient tolerated procedure well without immediate post procedural complication. FINDINGS: After successful fluoroscopic guided placement, the gastrostomy tube is appropriately positioned with internal disc against the ventral aspect of the gastric lumen. IMPRESSION: Successful fluoroscopic insertion of a 20-French pull-through gastrostomy tube. The gastrostomy may be used immediately for medication administration and in 24 hrs for the initiation of feeds. Electronically Signed   By: Sandi Mariscal M.D.   On: 04/04/2020 14:48   CT CHEST ABDOMEN PELVIS W CONTRAST  Result Date: 03/20/2020 CLINICAL DATA:  54 year old female with brain mass.  Evaluate for metastatic disease. EXAM: CT CHEST, ABDOMEN, AND PELVIS WITH CONTRAST TECHNIQUE: Multidetector CT imaging of the chest, abdomen and pelvis was performed following the standard protocol during bolus administration of intravenous contrast. CONTRAST:  148m OMNIPAQUE IOHEXOL 350 MG/ML SOLN COMPARISON:  None. FINDINGS: CT CHEST FINDINGS Cardiovascular: There is no cardiomegaly or pericardial effusion. There is noncalcified plaque along the thoracic aorta and aortic arch. No aneurysmal dilatation or dissection. The origins of the great vessels of the aortic arch appear patent. The central pulmonary arteries are unremarkable for the degree of opacification. Mediastinum/Nodes: Top-normal right hilar lymph nodes. No adenopathy. The esophagus and the thyroid gland are grossly unremarkable. No mediastinal fluid collection. Lungs/Pleura: There is a patchy area of consolidation with air bronchograms involving the right lower lobe which may represent atelectasis but concerning for pneumonia. Aspiration is not excluded. Clinical correlation and follow-up to resolution recommended to exclude an underlying mass. Faint left lung base linear and platelike atelectasis noted. There is no pleural effusion or pneumothorax. The central airways are patent. Musculoskeletal: No acute osseous pathology. CT ABDOMEN PELVIS FINDINGS No intra-abdominal free air. There is a small free fluid in the pelvis. Hepatobiliary: The liver is unremarkable. No intrahepatic biliary ductal dilatation. The gallbladder is unremarkable. Pancreas: Unremarkable. No pancreatic ductal dilatation or surrounding inflammatory changes. Spleen: Normal in size without focal abnormality. Adrenals/Urinary Tract: The adrenal glands are unremarkable. There is no hydronephrosis on either side. There is symmetric enhancement and excretion of contrast by both kidneys. The visualized ureters and urinary bladder appear unremarkable. Stomach/Bowel: There is moderate  stool throughout the colon. There is no bowel obstruction or active inflammation. The appendix is normal. Vascular/Lymphatic: The abdominal aorta and IVC are unremarkable. No portal venous gas. There is no adenopathy. Reproductive: Hysterectomy. There is a 9 mm calcific focus in the right posterior pelvis which is not evaluated but may be associated with the ovary. Pelvic ultrasound may provide better evaluation. Other: Anterior pelvic wall surgical scar.  No fluid collection. Musculoskeletal: Degenerative changes of the spine. No acute osseous pathology. IMPRESSION: 1. Right lower lobe consolidation concerning for pneumonia. Aspiration is not excluded. Clinical correlation and follow-up to resolution recommended to exclude  an underlying mass. 2. No evidence of metastatic disease in the chest, abdomen or pelvis. 3. An indeterminate 9 mm calcific focus in the right posterior pelvis may be associated with the ovary. Pelvic ultrasound may provide better evaluation. 4. Aortic Atherosclerosis (ICD10-I70.0). Electronically Signed   By: Anner Crete M.D.   On: 03/20/2020 19:52   DG Chest Port 1 View  Result Date: 03/28/2020 CLINICAL DATA:  Respiratory failure.  Hypoxia.  COVID positive. EXAM: PORTABLE CHEST 1 VIEW COMPARISON:  03/27/2020. FINDINGS: Endotracheal tube and NG tube in stable position. Right PICC line stable position. Heart size stable. Low lung volumes with persistent bibasilar atelectasis/infiltrates. Interim improvement in aeration from prior exam. No prominent pleural effusion. No pneumothorax. IMPRESSION: 1.  Lines and tubes stable position. 2. Low lung volumes with persistent bibasilar atelectasis/infiltrates. Interim improvement in aeration from prior exam. Electronically Signed   By: Marcello Moores  Register   On: 03/28/2020 06:14   DG CHEST PORT 1 VIEW  Result Date: 03/27/2020 CLINICAL DATA:  Respiratory failure. EXAM: PORTABLE CHEST 1 VIEW COMPARISON:  03/23/2020 FINDINGS: 0523 hours. Endotracheal  tube tip has pulled back in the interval and is now approximately 6.3 cm above the base of the carina. The NG tube passes into the stomach although the distal tip position is not included on the film. Interval improvement in basilar aeration with some persistent/residual streaky opacity in the bases, likely atelectasis. No pulmonary edema or pleural effusion. The visualized bony structures of the thorax show no acute abnormality. Telemetry leads overlie the chest. IMPRESSION: 1. Interval repositioning of the endotracheal tube. 2. Interval improvement in basilar aeration with some residual probable atelectasis at the lung bases. Electronically Signed   By: Misty Stanley M.D.   On: 03/27/2020 07:25   Portable Chest x-ray  Result Date: 03/23/2020 CLINICAL DATA:  54 year old female status post intubation. EXAM: PORTABLE CHEST 1 VIEW COMPARISON:  Chest CT dated 03/20/2020. FINDINGS: Endotracheal tube with tip at the level of the carina tilting towards the right mainstem bronchus. Recommend retraction by approximately 4 cm for optimal positioning. Enteric tube extends below the diaphragm with tip beyond the inferior margin of the image. Bilateral streaky and hazy airspace densities may represent atelectasis or pneumonia. No large pleural effusion. No pneumothorax. The cardiac silhouette is within limits. No acute osseous pathology. IMPRESSION: 1. Endotracheal tube with tip at the level of the carina tilting towards the right mainstem bronchus. Recommend retraction by 4 cm for optimal positioning. 2. Bilateral streaky and hazy airspace densities may represent atelectasis or pneumonia. These results were called by telephone at the time of interpretation on 03/23/2020 at 12:57 am to nurse Purcell Nails, who verbally acknowledged these results. Electronically Signed   By: Anner Crete M.D.   On: 03/23/2020 01:03   DG Abd Portable 1V  Result Date: 04/16/2020 CLINICAL DATA:  Distension EXAM: PORTABLE ABDOMEN - 1 VIEW  COMPARISON:  the previous day's study FINDINGS: Gastrostomy tube in place. The small bowel is nondilated. There is mild gaseous distention of the colon as before, without dilatation. Stable left pelvic phlebolith. Regional bones unremarkable. IMPRESSION: Little change in mild gaseous distention of the colon. Electronically Signed   By: Lucrezia Europe M.D.   On: 04/16/2020 08:31   DG Abd Portable 1V  Result Date: 04/15/2020 CLINICAL DATA:  Abdominal distension. EXAM: PORTABLE ABDOMEN - 1 VIEW COMPARISON:  04/14/2020 and FINDINGS: Persistent gaseous distention of the colon likely colonic ileus/inertia. Feeding gastrostomy tube is noted in unchanged position. IMPRESSION: Persistent gaseous distention  of the colon likely colonic ileus/inertia. Electronically Signed   By: Marijo Sanes M.D.   On: 04/15/2020 14:16   DG Abd Portable 1V  Result Date: 04/14/2020 CLINICAL DATA:  Evaluate for stool within the right colon. EXAM: PORTABLE ABDOMEN - 1 VIEW COMPARISON:  04/13/2020 FINDINGS: Gastrostomy tube is again noted projecting over the left upper abdomen. Similar volume of stool noted within the right colon. Gaseous distension of the transverse colon and splenic flexure is again identified with intraluminal gas up to the level of the rectum. IMPRESSION: 1. Similar volume of stool within the right colon. 2. Persistent gaseous distension of the transverse colon and splenic flexure. Electronically Signed   By: Kerby Moors M.D.   On: 04/14/2020 08:44   DG Abd Portable 1V  Result Date: 04/13/2020 CLINICAL DATA:  54 year old female with a history of abdominal distension EXAM: PORTABLE ABDOMEN - 1 VIEW COMPARISON:  04/12/2020, 04/11/2020 FINDINGS: Formed stool within the right colon. Gas-filled transverse colon and sigmoid colon extending to the rectum. No transition point. No abnormally distended small bowel. Gastric tube projects over the upper abdomen. Unremarkable skeletal structures IMPRESSION: Similar appearance of  the x-ray with right-sided stool burden and gas filled colon. No evidence of obstruction. Electronically Signed   By: Corrie Mckusick D.O.   On: 04/13/2020 11:18   DG Abd Portable 1V  Result Date: 04/12/2020 CLINICAL DATA:  Abdominal distention EXAM: PORTABLE ABDOMEN - 1 VIEW COMPARISON:  04/11/2020 FINDINGS: Gaseous distention of the colon noted. Moderate stool burden particularly in the right colon. Findings are similar to prior study. No organomegaly or free air. IMPRESSION: Moderate stool burden in the right colon with mild gaseous distention of the colon. No change. Electronically Signed   By: Rolm Baptise M.D.   On: 04/12/2020 08:40   [place] EKG SITE RITE  Result Date: 03/23/2020 If Site Rite image not attached, placement could not be confirmed due to current cardiac rhythm.    Time Spent in minutes  >30     Desiree Hane M.D on 04/16/2020 at 2:18 PM  To page go to [email or web address] - password [facility]

## 2020-04-17 DIAGNOSIS — J9601 Acute respiratory failure with hypoxia: Secondary | ICD-10-CM

## 2020-04-17 DIAGNOSIS — R4182 Altered mental status, unspecified: Secondary | ICD-10-CM

## 2020-04-17 LAB — GLUCOSE, CAPILLARY
Glucose-Capillary: 76 mg/dL (ref 70–99)
Glucose-Capillary: 79 mg/dL (ref 70–99)
Glucose-Capillary: 81 mg/dL (ref 70–99)
Glucose-Capillary: 87 mg/dL (ref 70–99)
Glucose-Capillary: 91 mg/dL (ref 70–99)
Glucose-Capillary: 93 mg/dL (ref 70–99)

## 2020-04-17 MED ORDER — JUVEN PO PACK
1.0000 | PACK | Freq: Two times a day (BID) | ORAL | Status: DC
  Administered 2020-04-17 – 2020-05-03 (×32): 1
  Filled 2020-04-17 (×32): qty 1

## 2020-04-17 NOTE — Progress Notes (Signed)
Nutrition Follow-up  DOCUMENTATION CODES:   Not applicable  INTERVENTION:  Tube feeding: -Jevity 1.5 @ 55 ml/hr (1320 ml) via PEG -ProSource TF 45 ml TID -Add Juven BID  Tube feeding regimen provides 2100 kcal, 117 grams of protein, and 1003 ml of H2O.   NUTRITION DIAGNOSIS:   Increased nutrient needs related to post-op healing as evidenced by estimated needs.  Ongoing  GOAL:   Patient will meet greater than or equal to 90% of their needs  Addressed via TF  MONITOR:   TF tolerance, I & O's  REASON FOR ASSESSMENT:   Ventilator, Consult Enteral/tube feeding initiation and management  ASSESSMENT:   Patient with PMH significant for HTN, HLD, COVID 19 infection (03/01/20), and dental infections. Presents this admission with R sided hemiplegia and R sided facial droop. 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 Opening eyes and moving RUE spontaneously. Abd xray shows stool burden. Had large watery BM yesterday per RN. Nothing today. Tube feeding running at goal rate. Follow for further BM and follow abd xray.   Admission weight: 69.2 kg  Current weight: 75.8 kg   Drips: D5 in LR @ 30 ml/hr  Medications: colace, folic acid, SS novolog, lantus, miralax Labs: CBG 79-138   Diet Order:   Diet Order            Diet NPO time specified  Diet effective midnight                 EDUCATION NEEDS:   Not appropriate for education at this time  Skin:  Skin Assessment: Skin Integrity Issues: Incisions: L head Unstageable: coccyx  Last BM:  9/21  Height:   Ht Readings from Last 1 Encounters:  04/06/20 5\' 7"  (1.702 m)    Weight:   Wt Readings from Last 1 Encounters:  04/17/20 75.8 kg    BMI:  Body mass index is 26.17 kg/m.  Estimated Nutritional Needs:   Kcal:   2000-2200 kcal  Protein:  95-120 grams  Fluid:  >/= 2 L/day   04/19/20 RD, LDN Clinical Nutrition Pager listed in AMION

## 2020-04-17 NOTE — Progress Notes (Signed)
NAME:  Vickie Alvarado, MRN:  [id number], DOB:  [birth year], LOS: 28 ADMISSION DATE:  03/20/2020, CONSULTATION DATE:  03/22/20 REFERRING MD:  Dr. 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 S/p L frontal stereotactic biopsy/aspiration of the abscess by neurosurgery and PCCM consulted to admit.  Past Medical History  Anxiety  Bipolar Disorder Dental infection HTN HLD  Significant Hospital Events   8/25> Admitted 8/27> Aspiration of brain abscess. Transferred to ICU   Consults:  PCCM  Neurosurgery   Procedures:  8/27> L frontal stereotactic biopsy/aspiration of L basal ganglia mass 04/02/2020 tracheostomy PEG tube placement  Significant Diagnostic Tests:  8/25 MR Brain> 6.5x4.5x4 cm mass in the epicenter of the L basal ganglia and radiating white matter tracts 8/29 MR Brain, angio head> post op changes from drainage of L basal ganglia without complication. Lesion now 5.1x2.4x2.7cm. Persistent vasogenic edema throughout L cerebral hemisphere with mildly worse L to R shift, now 61mm. Interval multifocal posterior circulation infarcts. MRA without large vessel occlusion. Scattered vascular irregularity involving basilar and bilateral PCAs without occlusion.  CT head 8/31 >> slightly improved edema of L hemisphere, 14mm R shift, improved patency basal cisterns.   Micro Data:  8/26 BC x2> NGTD 8/27 Gram Stain> sent 8/27 Aerobic/Anaerobe> strep intermedius, pan sensitive  8/27 Fungus Culture with stain> Sent    Antimicrobials:  8/27 Cefepime>> 8/27 8/27 Vanc>> 8/29 8/27 Metro>> 8/29 8/27 Ceftriaxone>>  Interim history/subjective:  Tracheostomy placed on 04/02/2020.  No acute distress neurological status is  poor  Objective   Blood pressure 113/66, pulse 84, temperature 98.4 F (36.9 C), temperature source Axillary, resp. rate 18, height 5\' 7"  (1.702 m), weight 75.8 kg, SpO2 99 %.    FiO2 (%):  [28 %] 28 %   Intake/Output Summary (Last 24 hours) at 04/17/2020 0915 Last data filed at 04/17/2020 0507 Gross per 24 hour  Intake 1780.62 ml  Output 500 ml  Net 1280.62 ml   Filed Weights   04/15/20 0156 04/17/20 0034 04/17/20 0208  Weight: 72.2 kg 75.8 kg 75.8 kg   Physical Exam General: Poorly responsive female HEENT: #6 cuffless trach is unremarkable Neuro: Random eye movements rigid posturing does not follow commands CV: Heart sounds are regular PULM: Diminished throughout GI: soft, bsx4 active, PEG in place  Extremities: warm/dry,  edema, right PICC Skin: no rashes or lesions   Resolved Hospital Problem list   Hypokalemia  Assessment & Plan:  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  Acute toxic/metabolic encephalopathy Bipolar Disorder Acute hypoxic respiratory failure requiring mechanical ventilation Hypertension Dyslipidemia Poorly controlled diabetes with hyperglycemia Tracheostomy care per PCCM all others per primary  Plan: Currently has #6 tracheostomy in place that is unremarkable. Pulmonary critical care will evaluate on a weekly basis or as needed Trach sutures have been removed Tracheostomy was performed on 04/02/2020  Best practice:  Diet: tube feeds per peg Pain/Anxiety/Delirium protocol (if indicated): N/A DVT prophylaxis: Subq Heparin Glucose control: SSI   Mobility: Bed rest  Code Status: FULL  Family Communication: Per primary Disposition: Transfer to PCU  Labs   CBC: Recent Labs  Lab 04/13/20  0500  WBC 5.9  HGB 10.6*  HCT 33.2*  MCV 92.5  PLT 192    Basic Metabolic Panel: Recent Labs  Lab 04/13/20 0500  NA 137  K 3.5   CL 102  CO2 29  GLUCOSE 118*  BUN 8  CREATININE 0.39*  CALCIUM 8.5*   GFR: Estimated Creatinine Clearance: 84.4 mL/min (A) (by C-G formula based on SCr of 0.39 mg/dL (L)). Recent Labs  Lab 04/13/20 0500  WBC 5.9    Liver Function Tests: Recent Labs  Lab 04/13/20 0500  AST 23  ALT 83*  ALKPHOS 93  BILITOT 0.1*  PROT 5.2*  ALBUMIN 2.4*   No results for input(s): LIPASE, AMYLASE in the last 168 hours. No results for input(s): AMMONIA in the last 168 hours.  ABG    Component Value Date/Time   PHART 7.458 (H) 03/23/2020 0525   PCO2ART 29.1 (L) 03/23/2020 0525   PO2ART 106 03/23/2020 0525   HCO3 20.6 03/23/2020 0525   TCO2 21 (L) 03/23/2020 0525   ACIDBASEDEF 2.0 03/23/2020 0525   O2SAT 98.0 03/23/2020 0525     Coagulation Profile: No results for input(s): INR, PROTIME in the last 168 hours.  Cardiac Enzymes: No results for input(s): CKTOTAL, CKMB, CKMBINDEX, TROPONINI in the last 168 hours.  HbA1C: Hgb A1c MFr Bld  Date/Time Value Ref Range Status  03/22/2020 04:55 PM 11.5 (H) 4.8 - 5.6 % Final    Comment:    (NOTE) Pre diabetes:          5.7%-6.4%  Diabetes:              >6.4%  Glycemic control for   <7.0% adults with diabetes     CBG: Recent Labs  Lab 04/16/20 1535 04/16/20 1942 04/16/20 2315 04/17/20 0332 04/17/20 0715  GLUCAP 84 101* 121* 93 81   Steve Vicke Plotner ACNP Acute Care Nurse Practitioner [facility] Pulmonary/Critical Care Please consult Amion 04/17/2020, 9:15 AM

## 2020-04-17 NOTE — Progress Notes (Signed)
Physical Therapy Wound Treatment Patient Details  Name: Vickie Alvarado MRN: [id number] Date of Birth: 1966/05/29  Today's Date: 04/17/2020 Time: 9937-1696 Time Calculation (min): 44 min  Subjective  Subjective: Pt on vent with eyes intermittently open and not responding to therapist. Patient and Family Stated Goals: None stated Date of Onset:  (Unknown)  Pain Score:  Pt did not appear to be in any pain throughout session.   Wound Assessment  Wound / Incision (Open or Dehisced) 04/09/20 Other (Comment) Coccyx Unstageable when assessed on 04/16/20 (Active)  Wound Image    04/17/20 1100  Dressing Type ABD;Barrier Film (skin prep);Gauze (Comment);Moist to dry 04/17/20 1100  Dressing Changed Changed 04/17/20 1100  Dressing Status Clean;Dry;Intact 04/17/20 1100  Dressing Change Frequency Daily 04/17/20 1100  Site / Wound Assessment Pink;Black 04/17/20 1100  % Wound base Red or Granulating 10% 04/17/20 1100  % Wound base Yellow/Fibrinous Exudate 0% 04/17/20 1100  % Wound base Black/Eschar 90% 04/17/20 1100  % Wound base Other/Granulation Tissue (Comment) 0% 04/17/20 1100  Peri-wound Assessment Intact 04/17/20 1100  Wound Length (cm) 7.5 cm 04/17/20 0900  Wound Width (cm) 9 cm 04/17/20 0900  Wound Depth (cm) 0.2 cm 04/17/20 0900  Wound Volume (cm^3) 13.5 cm^3 04/17/20 0900  Wound Surface Area (cm^2) 67.5 cm^2 04/17/20 0900  Margins Unattached edges (unapproximated) 04/17/20 1100  Closure None 04/17/20 1100  Drainage Amount Minimal 04/17/20 1100  Drainage Description Serosanguineous 04/17/20 1100  Treatment Debridement (Selective);Hydrotherapy (Pulse lavage);Packing (Saline gauze) 04/17/20 1100  Santyl applied to wound bed prior to applying dressing.      Hydrotherapy Pulsed lavage therapy - wound location: Sacrum Pulsed Lavage with Suction (psi): 12 psi Pulsed Lavage with Suction - Normal Saline Used: 1000 mL Pulsed Lavage Tip: Tip with splash shield Selective  Debridement Selective Debridement - Location: Sacrum Selective Debridement - Tools Used: Forceps;Scalpel Selective Debridement - Tissue Removed: Black necrotic tissue and eschar   Wound Assessment and Plan  Wound Therapy - Assess/Plan/Recommendations Wound Therapy - Clinical Statement: Pt presents to hydrotherapy with an unstagable pressure injury. Much of the thick outer layer of eschar was able to be removed. Note measurements and percentages are reflective of wound prior to debridement. Pt will benefit from continued hydrotherapy for sele ctive removal of unviable tissue, to decrease bioburden and promote wound bed healing.  Wound Therapy - Functional Problem List: Decreased overall mobility - unresponsive on vent.  Factors Delaying/Impairing Wound Healing: Immobility;Multiple medical problems Hydrotherapy Plan: Debridement;Dressing change;Patient/family education;Pulsatile lavage with suction Wound Therapy - Frequency: 6X / week Wound Therapy - Follow Up Recommendations: Skilled nursing facility Wound Plan: See above  Wound Therapy Goals- Improve the function of patient's integumentary system by progressing the wound(s) through the phases of wound healing (inflammation - proliferation - remodeling) by: Decrease Necrotic Tissue to: 20% Decrease Necrotic Tissue - Progress: Goal set today Increase Granulation Tissue to: 80% Increase Granulation Tissue - Progress: Goal set today Goals/treatment plan/discharge plan were made with and agreed upon by patient/family: No, Patient unable to participate in goals/treatment/discharge plan and family unavailable Time For Goal Achievement: 7 days Wound Therapy - Potential for Goals: Fair  Goals will be updated until maximal potential achieved or discharge criteria met.  Discharge criteria: when goals achieved, discharge from hospital, MD decision/surgical intervention, no progress towards goals, refusal/missing three consecutive treatments without  notification or medical reason.  GP     Thelma Comp 04/17/2020, 1:55 PM  Rolinda Roan, PT, DPT Acute Rehabilitation Services Pager: [phone] Office: [phone]

## 2020-04-17 NOTE — Progress Notes (Signed)
PROGRESS NOTE  Vickie Alvarado  DOB: 07/18/1966  PCP: [facility] OBS:[id number]  DOA: 03/20/2020  LOS: 28 days   Chief complaint: Right-sided weakness, dysarthria, right facial droop  Brief narrative: Vickie Alvarado is a 54 y.o. year old female with medical history significant for HTN, HLD, COVID-19 infection (diagnosed 03/01/2020), dental infection and sinus infections. Patient presented on 03/20/2020 with right-sided weakness, dysarthria and right facial droop also to have abscess in the left basal ganglia.   8/27, she underwent stereotactic biopsy and aspiration of the abscess by neurosurgery and was transferred to ICU.   8/27, MRI brain was obtained for worsening mental status.  8 showed new large left PCA infarct along with scattered infarcts in the right basal ganglia and right thalamus concerning for either CVA related to septic emboli versus herniation syndrome.  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   Patient was also noted to have Streptococcus intermedius bacteremia for which she was started on IV Rocephin on 8/27 as recommended by ID and need to continue for a total course of 8 weeks,  acute hypoxic respiratory failure require mechanical ventilation with failure to extubate requiring percutaneous tracheostomy now on trach collar tolerating 5 L O2,   large left PCA territory infarct with associated vasogenic edema and central thromboembolic source suspected secondary to sequela from herniation syndrome per neurosurgery evaluation. Given her large hemisphere brain abscess with surrounding edema/cerebritis neurosurgery did not recommend decompressive surgery. Underwent PEG placement on 9/9 with tube feeds starting  at that time.  9/13, repeat CT head shows improvement in fluid collections shows persistent left parietal/occipital acute/subacute infarcts with improvement in midline shift.  She has completed Decadron taper  Subjective: Patient was seen and examined this morning. Middle-aged African-American female.  Eyes open.  Unable to follow any commands.  Unable to have any comprehension.  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  Right maxillary sinus inflammation, mild sinus disease-noted on MRI brain 9/20 -Status post stereotactic biopsy/aspiration by neurosurgery on 03/22/2020. MRI brain on 9/20 showed decrease in size of left basal ganglia abscess with no enhancement on the left frontal aspiration tract.   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  Neurologic exam is stable (opens eyes to voice only).  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  No new infarcts on MRI brain -Continue scheduling of offloading of pressure  Abdominal distention, stable.   9/22, repeat abdominal x-ray showed persistent gaseous distention of the colon.  Currently on motility agents.  Continue MiraLAX, Senokot.  Acute hypoxic respiratory failure, status post trach 04/01/2020, stable 4-5 L of O2 on trach collar. Trach #8 and cuffed - will need to be cuffless prior to discharge,discussed with PCCM who will follow tomorrow for trach support  HTN, stable -Continue Amlodipine 5 mg  Nutrition -Continue Prosource via feeding  tube  GERD, stable -continue PPI per tube  Type 2 diabetes, poorly controlled  A1c 11.5.  CBG at goal currently -Lantus 20 units daily, NovoLog 3 units every 4 hours while on tube feeds, CBG every 4 h  Lab Results  Component Value Date   HGBA1C 11.5 (H) 03/22/2020   Recent Labs  Lab 04/16/20 1942 04/16/20 2315 04/17/20 0332 04/17/20 0715 04/17/20 1154  GLUCAP 101* 121* 93 81 79   Anemia of chronic disease.  Folate levels wnl adequate iron/ferritin -Continue folate supplementation, monitor hemoglobin Recent Labs    03/31/20 1016 03/31/20 1016 04/01/20 0620 04/01/20 0620 04/04/20 1446 04/04/20 1446 04/07/20 0435 04/08/20 1014 04/08/20 1235 04/13/20 0500  HGB 11.6*  --  11.2*  --  11.6*  --  10.0*  --   --  10.6*  MCV 91.8   < > 92.1   < > 91.3   < > 92.1  --   --  92.5  VITAMINB12  --   --   --   --   --   --   --   --  1,168*  --   FOLATE  --   --   --   --   --   --   --   --  5.9*  --   FERRITIN  --   --   --   --   --   --   --   --  760*  --   TIBC  --   --   --   --   --   --   --   --  225*  --   IRON  --   --   --   --   --   --   --   --  36  --   RETICCTPCT  --   --   --   --   --   --   --  3.0  --   --    < > = values in this interval not displayed.    History of bipolar disorder, stable -Not on home Seroquel here  Pressure ulcer in gluteal cleft/sacral area,  consistent with stage 2 on my exam (no full loss of dermis, no fat exposure) difficult to promote healing given patient is incontinent of stool -Wound consulted, follow recommendations for cleaning, and foam dressing to sacrum every day  Mobility: PT eval when able to follow command Code Status:   Code Status: Full Code  Nutritional status: Body mass index is 26.17 kg/m. Nutrition Problem: Increased nutrient needs Etiology: post-op healing Signs/Symptoms: estimated needs Diet Order            Diet NPO time specified  Diet effective midnight                 DVT prophylaxis: heparin  injection 5,000 Units Start: 04/04/20 2200 SCDs Start: 03/20/20 1640   Antimicrobials:  IV Rocephin Fluid: D5 LR at 30 mill per hour Consultants: ID, neurosurgery, neurology Family Communication:  None at bedside  Status is: Inpatient  Remains inpatient appropriate because:Hemodynamically unstable and Ongoing diagnostic testing needed not appropriate for outpatient work up   Dispo: The patient is from: Home              Anticipated d/c is to: SNF              Anticipated d/c date is: > 3 days              Patient currently is not medically stable to d/c.  Infusions:  . cefTRIAXone (ROCEPHIN)  IV 2 g (04/17/20 0837)  . dextrose 5% lactated ringers 30 mL/hr at 04/17/20 0322  . feeding supplement (JEVITY 1.5 CAL/FIBER) 1,000 mL (04/17/20 0706)    Scheduled Meds: . amLODipine  5 mg Per Tube Daily  . chlorhexidine gluconate (MEDLINE KIT)  15 mL Mouth Rinse BID  . Chlorhexidine Gluconate Cloth  6 each Topical Daily  . collagenase   Topical Daily  . docusate  100 mg Per Tube BID  . feeding supplement (PROSource TF)  45 mL Per Tube TID  . folic acid  1 mg Per Tube Daily  . heparin injection (subcutaneous)  5,000 Units Subcutaneous Q8H  . insulin aspart  0-20 Units Subcutaneous Q4H  . insulin aspart  3 Units Subcutaneous Q4H  . insulin glargine  20 Units Subcutaneous Daily  . levETIRAcetam  500 mg Per Tube BID  . mouth rinse  15 mL Mouth Rinse 10 times per day  . nutrition supplement (JUVEN)  1 packet Per Tube BID BM  . pantoprazole sodium  40 mg Per Tube QHS  . polyethylene glycol  17 g Per Tube BID  . sodium chloride flush  10-40 mL Intracatheter Q12H  . sorbitol, milk of mag, mineral oil, glycerin (SMOG) enema  960 mL Rectal Once    Antimicrobials: Anti-infectives (From admission, onward)   Start     Dose/Rate Route Frequency Ordered Stop   04/14/20 1715  fluconazole (DIFLUCAN) tablet 150 mg        150 mg Per Tube  Once 04/14/20 1614 04/14/20 1813   04/04/20 1000   ceFAZolin (ANCEF) IVPB 2g/100 mL premix        2 g 200 mL/hr over 30 Minutes Intravenous To Radiology 04/04/20 0935 04/04/20 1053   04/03/20 0600  ceFAZolin (ANCEF) IVPB 1 g/50 mL premix        1 g 100 mL/hr over 30 Minutes Intravenous To Radiology 04/01/20 1239 04/04/20 0600   03/24/20 1030  vancomycin (VANCOREADY) IVPB 1500 mg/300 mL  Status:  Discontinued        1,500 mg 150 mL/hr over 120 Minutes Intravenous Every 8 hours 03/24/20 0952 03/24/20 1336   03/23/20 0130  vancomycin (VANCOCIN) IVPB 1000 mg/200 mL premix  Status:  Discontinued        1,000 mg 200 mL/hr over 60 Minutes Intravenous Every 8 hours 03/22/20 1647 03/24/20 0952   03/22/20 2200  cefTRIAXone (ROCEPHIN) 2 g in sodium chloride 0.9 % 100 mL IVPB        2 g 200 mL/hr over 30 Minutes Intravenous Every 12 hours 03/22/20 1617     03/22/20 1730  vancomycin (VANCOREADY) IVPB 1750 mg/350 mL        1,750 mg 175 mL/hr over 120 Minutes Intravenous  Once 03/22/20 1647 03/22/20 2030   03/22/20 1630  metroNIDAZOLE (FLAGYL) IVPB 500 mg  Status:  Discontinued        500 mg 100 mL/hr over 60 Minutes Intravenous Every 8 hours 03/22/20 1618 03/24/20 1350   03/22/20 1336  bacitracin 50,000 Units in sodium chloride 0.9 % 500 mL irrigation  Status:  Discontinued          As needed 03/22/20 1336 03/22/20 1341   03/22/20 1315  vancomycin (VANCOCIN) IVPB 1000 mg/200 mL premix  Status:  Discontinued        1,000 mg 200 mL/hr over 60 Minutes Intravenous To Surgery 03/22/20 1313 03/22/20 1545   03/22/20 1315  ceFEPIme (MAXIPIME) 2 g in  sodium chloride 0.9 % 100 mL IVPB        2 g 200 mL/hr over 30 Minutes Intravenous To Surgery 03/22/20 1313 03/22/20 1354   03/22/20 1145  ceFAZolin (ANCEF) IVPB 2g/100 mL premix  Status:  Discontinued        2 g 200 mL/hr over 30 Minutes Intravenous  Once 03/22/20 1131 03/22/20 1545   03/22/20 1138  ceFAZolin (ANCEF) 2-4 GM/100ML-% IVPB       Note to Pharmacy: Gleason, Ginger   : cabinet override       03/22/20 1138 03/22/20 2344      PRN meds: acetaminophen (TYLENOL) oral liquid 160 mg/5 mL, acetaminophen **OR** acetaminophen, fentaNYL (SUBLIMAZE) injection, hydrALAZINE, HYDROcodone-acetaminophen, labetalol, ondansetron (ZOFRAN) IV, polyethylene glycol, promethazine, sodium chloride flush   Objective: Vitals:   04/17/20 1347 04/17/20 1501  BP:  109/65  Pulse:  79  Resp: 16 16  Temp:    SpO2:  97%    Intake/Output Summary (Last 24 hours) at 04/17/2020 1608 Last data filed at 04/17/2020 1506 Gross per 24 hour  Intake 1780.62 ml  Output 900 ml  Net 880.62 ml   Filed Weights   04/15/20 0156 04/17/20 0034 04/17/20 0208  Weight: 72.2 kg 75.8 kg 75.8 kg   Weight change:  Body mass index is 26.17 kg/m.   Physical Exam: General exam: Appears calm and comfortable.  Not in distress Skin: No rashes, lesions or ulcers. HEENT: Atraumatic, normocephalic, supple neck, no obvious bleeding Lungs: Clear to auscultation bilaterally CVS: Regular rate and rhythm, no murmur GI/Abd soft, nontender, nondistended, bowel sound present CNS: Only able to keep eyes open.  Unable to follow any commands or have a conversation. Psychiatry: Unable to examine Extremities: No pedal edema, no calf tenderness  Data Review: I have personally reviewed the laboratory data and studies available.  Recent Labs  Lab 04/13/20 0500  WBC 5.9  HGB 10.6*  HCT 33.2*  MCV 92.5  PLT 192   Recent Labs  Lab 04/13/20 0500  NA 137  K 3.5  CL 102  CO2 29  GLUCOSE 118*  BUN 8  CREATININE 0.39*  CALCIUM 8.5*    F/u labs ordered  Signed, Terrilee Croak, MD [facility] 04/17/2020

## 2020-04-18 LAB — GLUCOSE, CAPILLARY
Glucose-Capillary: 84 mg/dL (ref 70–99)
Glucose-Capillary: 112 mg/dL — ABNORMAL HIGH (ref 70–99)
Glucose-Capillary: 102 mg/dL — ABNORMAL HIGH (ref 70–99)
Glucose-Capillary: 92 mg/dL (ref 70–99)
Glucose-Capillary: 89 mg/dL (ref 70–99)
Glucose-Capillary: 110 mg/dL — ABNORMAL HIGH (ref 70–99)
Glucose-Capillary: 102 mg/dL — ABNORMAL HIGH (ref 70–99)

## 2020-04-18 NOTE — Progress Notes (Signed)
Physical Therapy Wound Treatment Patient Details  Name: Vickie Alvarado MRN: [id number] Date of Birth: February 17, 1966  Today's Date: 04/18/2020 Time: 0926-1000 Time Calculation (min): 34 min  Subjective  Subjective: Pt on vent with eyes open and not responding to therapist. Patient and Family Stated Goals: None stated Date of Onset:  (Unknown)  Pain Score:  No indication of pain throughout session.   Wound Assessment  Wound / Incision (Open or Dehisced) 04/09/20 Other (Comment) Coccyx Unstageable when assessed on 04/16/20 (Active)  Dressing Type ABD;Barrier Film (skin prep);Gauze (Comment);Moist to dry 04/18/20 1013  Dressing Changed Changed 04/18/20 1013  Dressing Status Clean;Dry;Intact 04/18/20 1013  Dressing Change Frequency Daily 04/18/20 1013  Site / Wound Assessment Yellow;Black;Pink 04/18/20 1013  % Wound base Red or Granulating 50% 04/18/20 1013  % Wound base Yellow/Fibrinous Exudate 30% 04/18/20 1013  % Wound base Black/Eschar 20% 04/18/20 1013  % Wound base Other/Granulation Tissue (Comment) 0% 04/18/20 1013  Peri-wound Assessment Intact 04/18/20 1013  Wound Length (cm) 7.5 cm 04/17/20 0900  Wound Width (cm) 9 cm 04/17/20 0900  Wound Depth (cm) 0.2 cm 04/17/20 0900  Wound Volume (cm^3) 13.5 cm^3 04/17/20 0900  Wound Surface Area (cm^2) 67.5 cm^2 04/17/20 0900  Margins Unattached edges (unapproximated) 04/18/20 1013  Closure None 04/18/20 1013  Drainage Amount Moderate 04/18/20 1013  Drainage Description Serosanguineous 04/18/20 1013  Treatment Debridement (Selective);Hydrotherapy (Pulse lavage);Packing (Saline gauze) 04/18/20 1013   Santyl applied to wound bed prior to applying dressing.     Hydrotherapy Pulsed lavage therapy - wound location: Sacrum Pulsed Lavage with Suction (psi): 12 psi Pulsed Lavage with Suction - Normal Saline Used: 1000 mL Pulsed Lavage Tip: Tip with splash shield Selective Debridement Selective Debridement - Location: Sacrum Selective  Debridement - Tools Used: Forceps;Scalpel Selective Debridement - Tissue Removed: Black necrotic tissue and eschar   Wound Assessment and Plan  Wound Therapy - Assess/Plan/Recommendations Wound Therapy - Clinical Statement: Wound bed improved in appearance with increased pink tissue showing through slough. Some eschar still present around the edges but softening. This patient will benefit from continued hydrotherapy fort continued removal of unviable tissue, to decrease bioburden and promote wound bed healing.  Wound Therapy - Functional Problem List: Decreased overall mobility - unresponsive on vent.  Factors Delaying/Impairing Wound Healing: Immobility;Multiple medical problems Hydrotherapy Plan: Debridement;Dressing change;Patient/family education;Pulsatile lavage with suction Wound Therapy - Frequency: 6X / week Wound Therapy - Follow Up Recommendations: Skilled nursing facility Wound Plan: See above  Wound Therapy Goals- Improve the function of patient's integumentary system by progressing the wound(s) through the phases of wound healing (inflammation - proliferation - remodeling) by: Decrease Necrotic Tissue to: 20% Decrease Necrotic Tissue - Progress: Progressing toward goal Increase Granulation Tissue to: 80% Increase Granulation Tissue - Progress: Progressing toward goal Goals/treatment plan/discharge plan were made with and agreed upon by patient/family: No, Patient unable to participate in goals/treatment/discharge plan and family unavailable Time For Goal Achievement: 7 days Wound Therapy - Potential for Goals: Fair  Goals will be updated until maximal potential achieved or discharge criteria met.  Discharge criteria: when goals achieved, discharge from hospital, MD decision/surgical intervention, no progress towards goals, refusal/missing three consecutive treatments without notification or medical reason.  GP     Thelma Comp 04/18/2020, 12:48 PM   Rolinda Roan, PT,  DPT Acute Rehabilitation Services Pager: [phone] Office: [phone]

## 2020-04-18 NOTE — Procedures (Signed)
Tracheostomy Change Note  Patient Details:   Name: Vickie Alvarado DOB: 08/06/1965 MRN: [id number]    Airway Documentation:     Evaluation  O2 sats: stable throughout Complications: No apparent complications Patient did tolerate procedure well. Bilateral Breath Sounds: Diminished, Expiratory wheezes   Changed pt's trach from #8 shiley to #6 trach cuffless without complication.     Merlene Laughter 04/18/2020, 3:04 PM

## 2020-04-18 NOTE — Progress Notes (Signed)
PROGRESS NOTE  Vickie Alvarado  DOB: 20-Aug-1965  PCP: [facility] NLZ:[id number]  DOA: 03/20/2020  LOS: 29 days   Chief complaint: Right-sided weakness, dysarthria, right facial droop  Brief narrative: Vickie Alvarado is a 54 y.o. year old female with medical history significant for HTN, HLD, COVID-19 infection (diagnosed 03/01/2020), dental infection and sinus infections.  Timeline of events: 8/25, patient presented with right-sided weakness, dysarthria and right facial droop also to have abscess in the left basal ganglia.  Patient was admitted to the hospital. 8/27, underwent stereotactic biopsy and aspiration of the abscess by neurosurgery and was transferred to ICU.  Wound culture resulted Streptococcus intermedius 8/27, MRI brain was obtained for worsening mental status.  It showed new large left PCA infarct along with scattered infarcts in the right basal ganglia and right thalamus concerning for either CVA related to septic emboli versus herniation syndrome. Neurosurgery believed patient developed the new development of the posterior cerebral artery distribution infarct due to to sequelae from herniation. Given her large hemisphere brain abscess with surrounding edema/cerebritis neurosurgery did not recommend decompressive surgery.  Patient was placed on a tapering course of Decadron which she completed by now. 8/28, PICC line placed 9/6, tracheostomy 9/9, PEG placement and tube feeding started 9/13, repeat CT head showed improvement in fluid collections blood showed persistent left parietal/occipital acute/subacute infarcts with improvement in midline shift.  Subjective: Patient was seen and examined this morning. Eyes open spontaneously, not on command.  Unable to follow any commands.  One of the daughters was at bedside.  And the daughter on the phone.   Family remains hopeful of her recovery.    Assessment/Plan: Acute toxic metabolic encephalopathy -Patient  has poor neurological status.  Eyes open but unable to follow any commands. -Multifactorial: Brain abscess, brain tissue herniation, vasogenic edema, CVA -Neurological status is poor.  Unlikely to have meaningful recovery.  However patient's family remains hopeful.  PT/OT/palliative care on board. 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 -On IV Rocephin for 8 weeks course as directed by ID to complete on 05/17/2020. -Completed a course of Decadron. -Continue Keppra 500 mg twice daily for seizure prophylaxis.  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  Acute hypoxic respiratory failure -status post trach 04/01/2020,  -stable 4-5 L of O2 on trach collar.  -PCCM following.  Trach tube downsized to 6 -0 cuffless.  Continue 4-5 L oxygen by trach collar.  Abdominal distention, stable.   9/22, repeat abdominal x-ray showed persistent gaseous distention of the colon.  Currently on motility agents. Continue MiraLAX, Senokot.  Nutrition -Continue Prosource via feeding tube  GERD, stable -continue PPI per tube  Type 2 diabetes, poorly controlled A1c 11.5.  CBG at goal currently -Continue Lantus 20 units daily, NovoLog 3 units every 4 hours while on tube feeds, CBG every 4 h  Lab Results  Component Value Date   HGBA1C 11.5 (H) 03/22/2020   Recent Labs  Lab 04/17/20 2020 04/17/20 2339 04/18/20 0301 04/18/20 0809 04/18/20 1132  GLUCAP 87 76 112* 102* 92   Anemia of chronic disease.  Folate levels wnl adequate iron/ferritin -Continue folate supplementation, monitor hemoglobin Recent Labs    03/31/20 1016 03/31/20 1016 04/01/20 0620 04/01/20 0620 04/04/20 1446 04/04/20 1446 04/07/20 0435 04/08/20 1014  04/08/20 1235 04/13/20 0500  HGB 11.6*  --  11.2*  --  11.6*  --  10.0*  --   --  10.6*  MCV 91.8   < > 92.1   < > 91.3   < > 92.1  --   --  92.5  VITAMINB12  --   --   --   --   --   --   --   --  1,168*  --   FOLATE  --   --   --   --   --   --   --   --  5.9*  --   FERRITIN  --   --   --   --   --   --   --   --  760*  --   TIBC  --   --   --   --   --   --   --   --  225*  --   IRON  --   --   --   --   --   --   --   --  36  --   RETICCTPCT  --   --   --   --   --   --   --  3.0  --   --    < > = values in this interval not displayed.   History of bipolar disorder, stable -Seroquel on hold.  Currently avoiding any sedative medication.  Pressure ulcer in gluteal cleft/sacral area,  consistent with stage 2 on my exam (no full loss of dermis, no fat exposure) difficult to promote healing given patient is incontinent of stool -Wound consulted, follow recommendations for cleaning, and foam dressing to sacrum every day  Mobility: PT eval when able to follow command Code Status:   Code Status: Full Code  Nutritional status: Body mass index is 26.17 kg/m. Nutrition Problem: Increased nutrient needs Etiology: post-op healing Signs/Symptoms: estimated needs Diet Order            Diet NPO time specified  Diet effective midnight                 DVT prophylaxis: heparin injection 5,000 Units Start: 04/04/20 2200 SCDs Start: 03/20/20 1640   Antimicrobials:  IV Rocephin Fluid: D5 LR at 30 mill per hour along with tube feeding at 65 ml/h. Consultants: ID, neurosurgery, neurology Family Communication:  Daughter at bedside  Status is: Inpatient  Remains inpatient appropriate because:Hemodynamically unstable and Ongoing diagnostic testing needed not appropriate for outpatient work up   Dispo: The patient is from: Home              Anticipated d/c is to: SNF              Anticipated d/c date is: > 3 days              Patient currently is not medically stable to  d/c.   Infusions:  . cefTRIAXone (ROCEPHIN)  IV 2 g (04/18/20 0855)  . dextrose 5% lactated ringers 30 mL/hr at 04/18/20 1117  . feeding supplement (JEVITY 1.5 CAL/FIBER) 1,000 mL (04/18/20 0211)    Scheduled Meds: . chlorhexidine gluconate (MEDLINE KIT)  15 mL Mouth Rinse BID  . Chlorhexidine Gluconate Cloth  6 each Topical Daily  . collagenase   Topical Daily  . docusate  100 mg Per Tube BID  . feeding supplement (PROSource TF)  45 mL Per Tube TID  .  folic acid  1 mg Per Tube Daily  . heparin injection (subcutaneous)  5,000 Units Subcutaneous Q8H  . insulin aspart  0-20 Units Subcutaneous Q4H  . insulin aspart  3 Units Subcutaneous Q4H  . insulin glargine  20 Units Subcutaneous Daily  . levETIRAcetam  500 mg Per Tube BID  . mouth rinse  15 mL Mouth Rinse 10 times per day  . nutrition supplement (JUVEN)  1 packet Per Tube BID BM  . pantoprazole sodium  40 mg Per Tube QHS  . polyethylene glycol  17 g Per Tube BID  . sodium chloride flush  10-40 mL Intracatheter Q12H  . sorbitol, milk of mag, mineral oil, glycerin (SMOG) enema  960 mL Rectal Once    Antimicrobials: Anti-infectives (From admission, onward)   Start     Dose/Rate Route Frequency Ordered Stop   04/14/20 1715  fluconazole (DIFLUCAN) tablet 150 mg        150 mg Per Tube  Once 04/14/20 1614 04/14/20 1813   04/04/20 1000  ceFAZolin (ANCEF) IVPB 2g/100 mL premix        2 g 200 mL/hr over 30 Minutes Intravenous To Radiology 04/04/20 0935 04/04/20 1053   04/03/20 0600  ceFAZolin (ANCEF) IVPB 1 g/50 mL premix        1 g 100 mL/hr over 30 Minutes Intravenous To Radiology 04/01/20 1239 04/04/20 0600   03/24/20 1030  vancomycin (VANCOREADY) IVPB 1500 mg/300 mL  Status:  Discontinued        1,500 mg 150 mL/hr over 120 Minutes Intravenous Every 8 hours 03/24/20 0952 03/24/20 1336   03/23/20 0130  vancomycin (VANCOCIN) IVPB 1000 mg/200 mL premix  Status:  Discontinued        1,000 mg 200 mL/hr over 60 Minutes Intravenous  Every 8 hours 03/22/20 1647 03/24/20 0952   03/22/20 2200  cefTRIAXone (ROCEPHIN) 2 g in sodium chloride 0.9 % 100 mL IVPB        2 g 200 mL/hr over 30 Minutes Intravenous Every 12 hours 03/22/20 1617     03/22/20 1730  vancomycin (VANCOREADY) IVPB 1750 mg/350 mL        1,750 mg 175 mL/hr over 120 Minutes Intravenous  Once 03/22/20 1647 03/22/20 2030   03/22/20 1630  metroNIDAZOLE (FLAGYL) IVPB 500 mg  Status:  Discontinued        500 mg 100 mL/hr over 60 Minutes Intravenous Every 8 hours 03/22/20 1618 03/24/20 1350   03/22/20 1336  bacitracin 50,000 Units in sodium chloride 0.9 % 500 mL irrigation  Status:  Discontinued          As needed 03/22/20 1336 03/22/20 1341   03/22/20 1315  vancomycin (VANCOCIN) IVPB 1000 mg/200 mL premix  Status:  Discontinued        1,000 mg 200 mL/hr over 60 Minutes Intravenous To Surgery 03/22/20 1313 03/22/20 1545   03/22/20 1315  ceFEPIme (MAXIPIME) 2 g in sodium chloride 0.9 % 100 mL IVPB        2 g 200 mL/hr over 30 Minutes Intravenous To Surgery 03/22/20 1313 03/22/20 1354   03/22/20 1145  ceFAZolin (ANCEF) IVPB 2g/100 mL premix  Status:  Discontinued        2 g 200 mL/hr over 30 Minutes Intravenous  Once 03/22/20 1131 03/22/20 1545   03/22/20 1138  ceFAZolin (ANCEF) 2-4 GM/100ML-% IVPB       Note to Pharmacy: Gleason, Ginger   : cabinet override      03/22/20 1138 03/22/20  2344      PRN meds: acetaminophen (TYLENOL) oral liquid 160 mg/5 mL, acetaminophen **OR** acetaminophen, hydrALAZINE, labetalol, ondansetron (ZOFRAN) IV, polyethylene glycol, promethazine, sodium chloride flush   Objective: Vitals:   04/18/20 0819 04/18/20 1253  BP:  135/76  Pulse: 85 85  Resp: 19 20  Temp:  99.3 F (37.4 C)  SpO2: 100% 99%    Intake/Output Summary (Last 24 hours) at 04/18/2020 1342 Last data filed at 04/18/2020 0448 Gross per 24 hour  Intake 1354 ml  Output 700 ml  Net 654 ml   Filed Weights   04/15/20 0156 04/17/20 0034 04/17/20 0208  Weight:  72.2 kg 75.8 kg 75.8 kg   Weight change:  Body mass index is 26.17 kg/m.   Physical Exam: General exam: Poor neurological status.  Not in physical distress Skin: No rashes, lesions or ulcers. HEENT: Atraumatic, normocephalic,no obvious bleeding.  Trach collar anteriorly Lungs: Clear to auscultation bilaterally CVS: Regular rate and rhythm, no murmur GI/Abd soft, nontender, nondistended, bowel sound present CNS: Poor neurological status. Only able to keep eyes open.  Unable to follow any commands or have a conversation. Psychiatry: Unable to examine Extremities: No pedal edema, no calf tenderness  Data Review: I have personally reviewed the laboratory data and studies available.  Recent Labs  Lab 04/13/20 0500  WBC 5.9  HGB 10.6*  HCT 33.2*  MCV 92.5  PLT 192   Recent Labs  Lab 04/13/20 0500  NA 137  K 3.5  CL 102  CO2 29  GLUCOSE 118*  BUN 8  CREATININE 0.39*  CALCIUM 8.5*    F/u labs ordered  Signed, Terrilee Croak, MD [facility] 04/18/2020

## 2020-04-19 DIAGNOSIS — G060 Intracranial abscess and granuloma: Secondary | ICD-10-CM | POA: Diagnosis not present

## 2020-04-19 LAB — COMPREHENSIVE METABOLIC PANEL
ALT: 39 U/L (ref 0–44)
AST: 17 U/L (ref 15–41)
Albumin: 2.2 g/dL — ABNORMAL LOW (ref 3.5–5.0)
Alkaline Phosphatase: 89 U/L (ref 38–126)
Anion gap: 9 (ref 5–15)
BUN: 11 mg/dL (ref 6–20)
CO2: 26 mmol/L (ref 22–32)
Calcium: 8 mg/dL — ABNORMAL LOW (ref 8.9–10.3)
Chloride: 104 mmol/L (ref 98–111)
Creatinine, Ser: 0.33 mg/dL — ABNORMAL LOW (ref 0.44–1.00)
GFR calc Af Amer: >60 mL/min (ref >60)
GFR calc non Af Amer: >60 mL/min (ref >60)
Glucose, Bld: 100 mg/dL — ABNORMAL HIGH (ref 70–99)
Potassium: 3.6 mmol/L (ref 3.5–5.1)
Sodium: 139 mmol/L (ref 135–145)
Total Bilirubin: 0.3 mg/dL (ref 0.3–1.2)
Total Protein: 4.8 g/dL — ABNORMAL LOW (ref 6.5–8.1)

## 2020-04-19 LAB — CBC WITH DIFFERENTIAL/PLATELET
Abs Immature Granulocytes: 0.01 10*3/uL (ref 0.00–0.07)
Basophils Absolute: 0 10*3/uL (ref 0.0–0.1)
Basophils Relative: 1 %
Eosinophils Absolute: 0.1 10*3/uL (ref 0.0–0.5)
Eosinophils Relative: 2 %
HCT: 32.8 % — ABNORMAL LOW (ref 36.0–46.0)
Hemoglobin: 10.6 g/dL — ABNORMAL LOW (ref 12.0–15.0)
Immature Granulocytes: 0 %
Lymphocytes Relative: 46 %
Lymphs Abs: 1.3 10*3/uL (ref 0.7–4.0)
MCH: 30 pg (ref 26.0–34.0)
MCHC: 32.3 g/dL (ref 30.0–36.0)
MCV: 92.9 fL (ref 80.0–100.0)
Monocytes Absolute: 0.5 10*3/uL (ref 0.1–1.0)
Monocytes Relative: 17 %
Neutro Abs: 1 10*3/uL — ABNORMAL LOW (ref 1.7–7.7)
Neutrophils Relative %: 34 %
Platelets: 303 10*3/uL (ref 150–400)
RBC: 3.53 MIL/uL — ABNORMAL LOW (ref 3.87–5.11)
RDW: 16.3 % — ABNORMAL HIGH (ref 11.5–15.5)
WBC: 2.9 10*3/uL — ABNORMAL LOW (ref 4.0–10.5)
nRBC: 0 % (ref 0.0–0.2)

## 2020-04-19 LAB — FUNGUS CULTURE WITH STAIN

## 2020-04-19 LAB — FUNGAL ORGANISM REFLEX

## 2020-04-19 LAB — GLUCOSE, CAPILLARY
Glucose-Capillary: 100 mg/dL — ABNORMAL HIGH (ref 70–99)
Glucose-Capillary: 123 mg/dL — ABNORMAL HIGH (ref 70–99)
Glucose-Capillary: 125 mg/dL — ABNORMAL HIGH (ref 70–99)
Glucose-Capillary: 128 mg/dL — ABNORMAL HIGH (ref 70–99)
Glucose-Capillary: 83 mg/dL (ref 70–99)
Glucose-Capillary: 91 mg/dL (ref 70–99)

## 2020-04-19 LAB — PHOSPHORUS: Phosphorus: 3.5 mg/dL (ref 2.5–4.6)

## 2020-04-19 LAB — FUNGUS CULTURE RESULT

## 2020-04-19 LAB — MAGNESIUM: Magnesium: 1.9 mg/dL (ref 1.7–2.4)

## 2020-04-19 MED ORDER — INFLUENZA VAC SPLIT QUAD 0.5 ML IM SUSY
0.5000 mL | PREFILLED_SYRINGE | INTRAMUSCULAR | Status: AC
  Administered 2020-04-21: 0.5 mL via INTRAMUSCULAR
  Filled 2020-04-19: qty 0.5

## 2020-04-19 NOTE — Care Management Important Message (Signed)
Important Message  Patient Details  Name: Vickie Alvarado MRN: [id number] Date of Birth: [birth year]   Medicare Important Message Given:  Yes     Dorena Bodo 04/19/2020, 2:37 PM

## 2020-04-19 NOTE — Progress Notes (Signed)
PROGRESS NOTE  Vickie Alvarado  DOB: Aug 02, 1965  PCP: [facility] MGQ:[id number]  DOA: 03/20/2020  LOS: 30 days   Chief complaint: Right-sided weakness, dysarthria, right facial droop  Brief narrative: Vickie Alvarado is a 54 y.o. year old female with medical history significant for HTN, HLD, COVID-19 infection (diagnosed 03/01/2020), dental infection and sinus infections.  Timeline of events: 8/25, patient presented with right-sided weakness, dysarthria and right facial droop also to have abscess in the left basal ganglia.  Patient was admitted to the hospital. 8/27, underwent stereotactic biopsy and aspiration of the abscess by neurosurgery and was transferred to ICU.  Wound culture resulted Streptococcus intermedius 8/27, MRI brain was obtained for worsening mental status.  It showed new large left PCA infarct along with scattered infarcts in the right basal ganglia and right thalamus concerning for either CVA related to septic emboli versus herniation syndrome. Neurosurgery believed patient developed the new development of the posterior cerebral artery distribution infarct due to to sequelae from herniation. Given her large hemisphere brain abscess with surrounding edema/cerebritis neurosurgery did not recommend decompressive surgery.  Patient was placed on a tapering course of Decadron which she completed by now. 8/28, PICC line placed 9/6, tracheostomy 9/9, PEG placement and tube feeding started 9/13, repeat CT head showed improvement in fluid collections blood showed persistent left parietal/occipital acute/subacute infarcts with improvement in midline shift.  Subjective: Patient was seen and examined this morning. Tries to open eyes on sternal rub.  No other response.  No family member at bedside. Vital signs stable.  On 5 L oxygen by trach collar. Labs this morning unremarkable.  Assessment/Plan: Acute toxic metabolic encephalopathy -Patient has poor  neurological status.  At the best, she can open eyes but that is inconsistent as well.  Unable to follow any other commands.  -Multifactorial: Brain abscess, brain tissue herniation, vasogenic edema, CVA -Neurological status is poor. Unlikely to have meaningful recovery.  However patient's family remains hopeful.  PT/OT/palliative care on board. 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 -On IV Rocephin for 8 weeks course as directed by ID to complete on 05/17/2020. -Completed a course of Decadron. -Continue Keppra 500 mg twice daily for seizure prophylaxis.  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  Acute hypoxic respiratory failure -status post trach 04/01/2020 -stable 4-5 L of O2 on trach collar.  -PCCM following.  Trach tube downsized to 6 -0 cuffless.  Continue 4-5 L oxygen by trach collar.  Abdominal distention, stable.   9/22, repeat abdominal x-ray showed persistent gaseous distention of the colon.  Currently on motility agents. Continue MiraLAX, Senokot.  Nutrition -Continue Prosource via feeding tube  GERD, stable -continue PPI per tube  Poorly controlled diabetes mellitus  -A1c 11.5  -Currently blood sugar is controlled on Lantus 20 units daily and NovoLog 3 units every 4 hours while on tube feeds. -Continue Accu-Cheks. Lab Results  Component Value Date   HGBA1C 11.5 (H) 03/22/2020   Recent Labs  Lab 04/18/20 1654 04/18/20 2038 04/18/20 2316 04/19/20 0409 04/19/20 0732  GLUCAP 89 110* 102* 125* 91   Anemia of chronic disease -Hemoglobin stable. Recent Labs    04/01/20 0620 04/01/20 0620 04/04/20 1446 04/04/20 1446 04/07/20 0435 04/07/20 0435 04/08/20 1014 04/08/20 1235  04/13/20 0500 04/19/20  0200  HGB 11.2*  --  11.6*  --  10.0*  --   --   --  10.6* 10.6*  MCV 92.1   < > 91.3   < > 92.1   < >  --   --  92.5 92.9  VITAMINB12  --   --   --   --   --   --   --  1,168*  --   --   FOLATE  --   --   --   --   --   --   --  5.9*  --   --   FERRITIN  --   --   --   --   --   --   --  760*  --   --   TIBC  --   --   --   --   --   --   --  225*  --   --   IRON  --   --   --   --   --   --   --  36  --   --   RETICCTPCT  --   --   --   --   --   --  3.0  --   --   --    < > = values in this interval not displayed.   History of bipolar disorder, stable -Seroquel on hold.  Currently avoiding any sedative medication.  Pressure ulcer in gluteal cleft/sacral area,   -consistent with stage 2 on my exam (no full loss of dermis, no fat exposure) difficult to promote healing given patient is incontinent of stool -Wound consulted, follow recommendations for cleaning, and foam dressing to sacrum every day  Goals of care -Currently remains full code.  Family remains hopeful despite low likelihood of meaningful recovery.  Palliative care consulted.  Mobility: PT eval when able to follow command Code Status:   Code Status: Full Code  Nutritional status: Body mass index is 26.17 kg/m. Nutrition Problem: Increased nutrient needs Etiology: post-op healing Signs/Symptoms: estimated needs Diet Order            Diet NPO time specified  Diet effective midnight                 DVT prophylaxis: heparin injection 5,000 Units Start: 04/04/20 2200 SCDs Start: 03/20/20 1640   Antimicrobials:  IV Rocephin Fluid: D5 LR at 30 mill per hour along with tube feeding at 65 ml/h. Consultants: ID, neurosurgery, neurology Family Communication:  Daughter at bedside  Status is: Inpatient  Remains inpatient appropriate because:Hemodynamically unstable and Ongoing diagnostic testing needed not appropriate for outpatient work up   Dispo: The patient is from: Home               Anticipated d/c is to: SNF              Anticipated d/c date is: > 3 days              Patient currently is not medically stable to d/c.   Infusions:  . cefTRIAXone (ROCEPHIN)  IV 2 g (04/18/20 2232)  . dextrose 5% lactated ringers 30 mL/hr at 04/18/20 1117  . feeding supplement (JEVITY 1.5 CAL/FIBER) 1,000 mL (04/18/20 2252)    Scheduled Meds: . chlorhexidine gluconate (MEDLINE KIT)  15 mL Mouth Rinse BID  . Chlorhexidine Gluconate Cloth  6 each Topical Daily  . collagenase   Topical Daily  .  docusate  100 mg Per Tube BID  . feeding supplement (PROSource TF)  45 mL Per Tube TID  . folic acid  1 mg Per Tube Daily  . heparin injection (subcutaneous)  5,000 Units Subcutaneous Q8H  . insulin aspart  0-20 Units Subcutaneous Q4H  . insulin aspart  3 Units Subcutaneous Q4H  . insulin glargine  20 Units Subcutaneous Daily  . levETIRAcetam  500 mg Per Tube BID  . mouth rinse  15 mL Mouth Rinse 10 times per day  . nutrition supplement (JUVEN)  1 packet Per Tube BID BM  . pantoprazole sodium  40 mg Per Tube QHS  . polyethylene glycol  17 g Per Tube BID  . sodium chloride flush  10-40 mL Intracatheter Q12H  . sorbitol, milk of mag, mineral oil, glycerin (SMOG) enema  960 mL Rectal Once    Antimicrobials: Anti-infectives (From admission, onward)   Start     Dose/Rate Route Frequency Ordered Stop   04/14/20 1715  fluconazole (DIFLUCAN) tablet 150 mg        150 mg Per Tube  Once 04/14/20 1614 04/14/20 1813   04/04/20 1000  ceFAZolin (ANCEF) IVPB 2g/100 mL premix        2 g 200 mL/hr over 30 Minutes Intravenous To Radiology 04/04/20 0935 04/04/20 1053   04/03/20 0600  ceFAZolin (ANCEF) IVPB 1 g/50 mL premix        1 g 100 mL/hr over 30 Minutes Intravenous To Radiology 04/01/20 1239 04/04/20 0600   03/24/20 1030  vancomycin (VANCOREADY) IVPB 1500 mg/300 mL  Status:  Discontinued        1,500 mg 150 mL/hr over 120 Minutes Intravenous Every 8 hours 03/24/20 0952 03/24/20 1336   03/23/20  0130  vancomycin (VANCOCIN) IVPB 1000 mg/200 mL premix  Status:  Discontinued        1,000 mg 200 mL/hr over 60 Minutes Intravenous Every 8 hours 03/22/20 1647 03/24/20 0952   03/22/20 2200  cefTRIAXone (ROCEPHIN) 2 g in sodium chloride 0.9 % 100 mL IVPB        2 g 200 mL/hr over 30 Minutes Intravenous Every 12 hours 03/22/20 1617     03/22/20 1730  vancomycin (VANCOREADY) IVPB 1750 mg/350 mL        1,750 mg 175 mL/hr over 120 Minutes Intravenous  Once 03/22/20 1647 03/22/20 2030   03/22/20 1630  metroNIDAZOLE (FLAGYL) IVPB 500 mg  Status:  Discontinued        500 mg 100 mL/hr over 60 Minutes Intravenous Every 8 hours 03/22/20 1618 03/24/20 1350   03/22/20 1336  bacitracin 50,000 Units in sodium chloride 0.9 % 500 mL irrigation  Status:  Discontinued          As needed 03/22/20 1336 03/22/20 1341   03/22/20 1315  vancomycin (VANCOCIN) IVPB 1000 mg/200 mL premix  Status:  Discontinued        1,000 mg 200 mL/hr over 60 Minutes Intravenous To Surgery 03/22/20 1313 03/22/20 1545   03/22/20 1315  ceFEPIme (MAXIPIME) 2 g in sodium chloride 0.9 % 100 mL IVPB        2 g 200 mL/hr over 30 Minutes Intravenous To Surgery 03/22/20 1313 03/22/20 1354   03/22/20 1145  ceFAZolin (ANCEF) IVPB 2g/100 mL premix  Status:  Discontinued        2 g 200 mL/hr over 30 Minutes Intravenous  Once 03/22/20 1131 03/22/20 1545   03/22/20 1138  ceFAZolin (ANCEF) 2-4 GM/100ML-% IVPB  Note to Pharmacy: Gleason, Ginger   : cabinet override      03/22/20 1138 03/22/20 2344      PRN meds: acetaminophen (TYLENOL) oral liquid 160 mg/5 mL, acetaminophen **OR** acetaminophen, hydrALAZINE, labetalol, ondansetron (ZOFRAN) IV, polyethylene glycol, promethazine, sodium chloride flush   Objective: Vitals:   04/19/20 0812 04/19/20 0827  BP:    Pulse:  82  Resp:  (!) 21  Temp: 98.5 F (36.9 C)   SpO2:  98%    Intake/Output Summary (Last 24 hours) at 04/19/2020 0944 Last data filed at 04/18/2020 2025 Gross per 24  hour  Intake 950 ml  Output --  Net 950 ml   Filed Weights   04/15/20 0156 04/17/20 0034 04/17/20 0208  Weight: 72.2 kg 75.8 kg 75.8 kg   Weight change:  Body mass index is 26.17 kg/m.   Physical Exam: General exam: Poor neurological status. Not in physical distress Skin: No rashes, lesions or ulcers. HEENT: Atraumatic, normocephalic,no obvious bleeding.  Trach collar anteriorly Lungs: Clear to auscultation bilaterally CVS: Regular rate and rhythm, no murmur GI/Abd soft, nontender, nondistended, bowel sound present CNS: Poor neurological status.  Barely tries to open eyes on sternal rub. Unable to follow any commands or have a conversation. Psychiatry: Unable to examine Extremities: No pedal edema, no calf tenderness  Data Review: I have personally reviewed the laboratory data and studies available.  Recent Labs  Lab 04/13/20 0500 04/19/20 0200  WBC 5.9 2.9*  NEUTROABS  --  1.0*  HGB 10.6* 10.6*  HCT 33.2* 32.8*  MCV 92.5 92.9  PLT 192 303   Recent Labs  Lab 04/13/20 0500 04/19/20 0200  NA 137 139  K 3.5 3.6  CL 102 104  CO2 29 26  GLUCOSE 118* 100*  BUN 8 11  CREATININE 0.39* 0.33*  CALCIUM 8.5* 8.0*  MG  --  1.9  PHOS  --  3.5   Signed, Terrilee Croak, MD [facility] 04/19/2020

## 2020-04-19 NOTE — Progress Notes (Signed)
Physical Therapy Wound Treatment Patient Details  Name: Vickie Alvarado MRN: [id number] Date of Birth: [birth year]  Today's Date: 04/19/2020 Time: 1130-1200 Time Calculation (min): 30 min  Subjective  Subjective: Pt on vent with eyes open and not responding to therapist. Not following commands.  Patient and Family Stated Goals: None stated Date of Onset:  (Unknown)  Pain Score:    Wound Assessment  Wound / Incision (Open or Dehisced) 04/09/20 Other (Comment) Coccyx Unstageable when assessed on 04/16/20 (Active)  Dressing Type ABD;Barrier Film (skin prep);Gauze (Comment);Moist to dry 04/19/20 1443  Dressing Changed Changed 04/19/20 1443  Dressing Status Clean;Dry;Intact 04/19/20 1443  Dressing Change Frequency Daily 04/19/20 1443  Site / Wound Assessment Pink;Yellow;Black 04/19/20 1443  % Wound base Red or Granulating 30% 04/19/20 1443  % Wound base Yellow/Fibrinous Exudate 50% 04/19/20 1443  % Wound base Black/Eschar 20% 04/19/20 1443  % Wound base Other/Granulation Tissue (Comment) 0% 04/19/20 1443  Peri-wound Assessment Intact 04/19/20 1443  Wound Length (cm) 7.5 cm 04/17/20 0900  Wound Width (cm) 9 cm 04/17/20 0900  Wound Depth (cm) 0.2 cm 04/17/20 0900  Wound Volume (cm^3) 13.5 cm^3 04/17/20 0900  Wound Surface Area (cm^2) 67.5 cm^2 04/17/20 0900  Margins Unattached edges (unapproximated) 04/19/20 1443  Closure None 04/19/20 1443  Drainage Amount Moderate 04/19/20 1443  Drainage Description Serosanguineous 04/19/20 1443  Treatment Debridement (Selective);Hydrotherapy (Pulse lavage);Packing (Saline gauze) 04/19/20 1443   Santyl applied to wound bed prior to applying dressing.     Hydrotherapy Pulsed lavage therapy - wound location: Sacrum Pulsed Lavage with Suction (psi): 12 psi Pulsed Lavage with Suction - Normal Saline Used: 1000 mL Pulsed Lavage Tip: Tip with splash shield Selective Debridement Selective Debridement - Location: Sacrum Selective Debridement - Tools  Used: Forceps;Scalpel Selective Debridement - Tissue Removed: Black necrotic tissue and eschar   Wound Assessment and Plan  Wound Therapy - Assess/Plan/Recommendations Wound Therapy - Clinical Statement: Wound appearance mostly yellow and black this session. Progressing in overall debridement with eschar around edges continuing to soften. Will follow for selective removal of unviable tissue, to decrease bioburden, and promote wound bed healing.  Wound Therapy - Functional Problem List: Decreased overall mobility - unresponsive on vent.  Factors Delaying/Impairing Wound Healing: Immobility;Multiple medical problems Hydrotherapy Plan: Debridement;Dressing change;Patient/family education;Pulsatile lavage with suction Wound Therapy - Frequency: 6X / week Wound Therapy - Follow Up Recommendations: Skilled nursing facility Wound Plan: See above  Wound Therapy Goals- Improve the function of patient's integumentary system by progressing the wound(s) through the phases of wound healing (inflammation - proliferation - remodeling) by: Decrease Necrotic Tissue to: 20% Decrease Necrotic Tissue - Progress: Progressing toward goal Increase Granulation Tissue to: 80% Increase Granulation Tissue - Progress: Progressing toward goal Goals/treatment plan/discharge plan were made with and agreed upon by patient/family: No, Patient unable to participate in goals/treatment/discharge plan and family unavailable Time For Goal Achievement: 7 days Wound Therapy - Potential for Goals: Fair  Goals will be updated until maximal potential achieved or discharge criteria met.  Discharge criteria: when goals achieved, discharge from hospital, MD decision/surgical intervention, no progress towards goals, refusal/missing three consecutive treatments without notification or medical reason.  GP     Thelma Comp 04/19/2020, 2:49 PM   Rolinda Roan, PT, DPT Acute Rehabilitation Services Pager: [phone] Office:  [phone]

## 2020-04-19 NOTE — TOC Progression Note (Addendum)
Transition of Care [facility]) - Progression Note    Patient Details  Name: Nolia Tschantz MRN: [id number] Date of Birth: October 19, 1965  [facility] CM/SW Contact  Beckie Busing, RN Phone Number: [phone]  04/19/2020, 2:36 PM  Clinical Narrative:    CM called [facility] to determine if facility is able to accept trach patient due to bed offer noted in the HUB. Spoke with Inetta Fermo in admissions who states that this facility does not accept trach patients.   1450 Per notes trach has been downsized to #6 cuffless. Internal note has been added and patient info has been resubmitted via the hub. Will await bed offers.     Expected Discharge Plan: Skilled Nursing Facility Barriers to Discharge: Continued Medical Work up  Expected Discharge Plan and Services Expected Discharge Plan: Skilled Nursing Facility In-house Referral: NA Discharge Planning Services: CM Consult   Living arrangements for the past 2 months: Single Family Home                 DME Arranged: N/A DME Agency: NA       HH Arranged: NA HH Agency: NA         Social Determinants of Health (SDOH) Interventions    Readmission Risk Interventions No flowsheet data found.

## 2020-04-20 DIAGNOSIS — G060 Intracranial abscess and granuloma: Secondary | ICD-10-CM | POA: Diagnosis not present

## 2020-04-20 LAB — GLUCOSE, CAPILLARY
Glucose-Capillary: 101 mg/dL — ABNORMAL HIGH (ref 70–99)
Glucose-Capillary: 103 mg/dL — ABNORMAL HIGH (ref 70–99)
Glucose-Capillary: 111 mg/dL — ABNORMAL HIGH (ref 70–99)
Glucose-Capillary: 114 mg/dL — ABNORMAL HIGH (ref 70–99)
Glucose-Capillary: 127 mg/dL — ABNORMAL HIGH (ref 70–99)
Glucose-Capillary: 68 mg/dL — ABNORMAL LOW (ref 70–99)
Glucose-Capillary: 86 mg/dL (ref 70–99)

## 2020-04-20 MED ORDER — DEXTROSE 50 % IV SOLN
INTRAVENOUS | Status: AC
  Administered 2020-04-20: 50 mL
  Filled 2020-04-20: qty 50

## 2020-04-20 NOTE — Progress Notes (Signed)
PROGRESS NOTE  Vickie Alvarado  DOB: [birth year]  PCP: [facility] PJK:[id number]  DOA: 03/20/2020  LOS: 31 days   Chief complaint: Right-sided weakness, dysarthria, right facial droop  Brief narrative: Vickie Alvarado is a 54 y.o. year old female with medical history significant for HTN, HLD, COVID-19 infection (diagnosed 03/01/2020), dental infection and sinus infections.  Timeline of events:  8/25, patient presented with right-sided weakness, dysarthria and right facial droop also to have abscess in the left basal ganglia.  Patient was admitted to the hospital.  8/27, underwent stereotactic biopsy and aspiration of the abscess by neurosurgery and was transferred to ICU.  Wound culture resulted Streptococcus intermedius  8/27, MRI brain was obtained for worsening mental status.  It showed new large left PCA infarct along with scattered infarcts in the right basal ganglia and right thalamus concerning for either CVA related to septic emboli versus herniation syndrome. Neurosurgery believed patient developed the new development of the posterior cerebral artery distribution infarct due to to sequelae from herniation. Given her large hemisphere brain abscess with surrounding edema/cerebritis neurosurgery did not recommend decompressive surgery.  Patient was placed on a tapering course of Decadron which she completed by now.  8/28, PICC line placed  9/6, tracheostomy  9/9, PEG placement and tube feeding started  9/13, repeat CT head showed improvement in fluid collections blood showed persistent left parietal/occipital acute/subacute infarcts with improvement in midline shift.  Subjective: Patient was seen and examined this morning. Eyes open.  Unable to follow any commands.  No family member at bedside Vital signs stable.  On 5 L oxygen by trach collar.  Assessment/Plan: Acute toxic metabolic encephalopathy -Patient has poor neurological status.  At the best, she  can open eyes but that is inconsistent as well.  Unable to follow any other commands.  -Multifactorial: Brain abscess, brain tissue herniation, vasogenic edema, CVA -Neurological status is poor. Unlikely to have meaningful recovery.  However patient's family remains hopeful.  PT/OT/palliative care on board. 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 -On IV Rocephin for 8 weeks course as directed by ID to complete on 05/17/2020. -Completed a course of Decadron. -Continue Keppra 500 mg twice daily for seizure prophylaxis.  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  Acute hypoxic respiratory failure -status post trach 04/01/2020 -stable 4-5 L of O2 on trach collar.  -PCCM following.  Trach tube downsized to 6 -0 cuffless.  Continue 4-5 L oxygen by trach collar.  Abdominal distention, stable.   9/22, repeat abdominal x-ray showed persistent gaseous distention of the colon.  Currently on motility agents. Continue MiraLAX, Senokot.  Nutrition -Continue Prosource via feeding tube  GERD, stable -continue PPI per tube  Poorly controlled diabetes mellitus  -A1c 11.5  -Blood sugar currently controlled on  Lantus 20 units daily and NovoLog 3 units every 4 hours while on tube feeds. -Continue Accu-Cheks. Lab Results  Component Value Date   HGBA1C 11.5 (H) 03/22/2020   Recent Labs  Lab 04/19/20 1954 04/19/20 2330 04/20/20 0335 04/20/20 0721 04/20/20 1217  GLUCAP 123* 83 86 114* 101*   Anemia of chronic disease -Hemoglobin stable.  Vitamin B12, folate and iron level adequate. Recent Labs    03/26/20 0531 03/27/20 0443 03/28/20 0409 03/29/20 0515 03/31/20 1016 04/01/20 8099  04/04/20 1446 04/07/20 0435  04/13/20 0500 04/19/20 0200  HGB 9.5* 9.8* 10.6* 11.2* 11.6* 11.2* 11.6* 10.0* 10.6* 10.6*   History of bipolar disorder, stable -Seroquel on hold.  Currently avoiding any sedative medication.  Pressure ulcer in gluteal cleft/sacral area,   -consistent with stage 2 on my exam (no full loss of dermis, no fat exposure) difficult to promote healing given patient is incontinent of stool -Wound consulted, follow recommendations for cleaning, and foam dressing to sacrum every day  Goals of care -Currently remains full code.  Family remains hopeful despite low likelihood of meaningful recovery.  Palliative care consult appreciated patient remains full code.  Mobility: PT eval when able to follow command Code Status:   Code Status: Full Code  Nutritional status: Body mass index is 26.17 kg/m. Nutrition Problem: Increased nutrient needs Etiology: post-op healing Signs/Symptoms: estimated needs Diet Order            Diet NPO time specified  Diet effective midnight                 DVT prophylaxis: heparin injection 5,000 Units Start: 04/04/20 2200 SCDs Start: 03/20/20 1640   Antimicrobials:  IV Rocephin Fluid: D5 LR at 30 ml/hour along with tube feeding at 65 ml/h. Consultants: ID, neurosurgery, neurology Family Communication:  Daughter at bedside  Status is: Inpatient  Remains inpatient appropriate because:Hemodynamically unstable and Ongoing diagnostic testing needed not appropriate for outpatient work up   Dispo: The patient is from: Home              Anticipated d/c is to: SNF              Anticipated d/c date is: > 3 days              Patient currently is not medically stable to d/c.   Infusions:  . cefTRIAXone (ROCEPHIN)  IV 2 g (04/20/20 0953)  . dextrose 5% lactated ringers 30 mL/hr at 04/18/20 1117  . feeding supplement (JEVITY 1.5 CAL/FIBER) 1,000 mL (04/18/20 2252)    Scheduled Meds: . chlorhexidine gluconate (MEDLINE KIT)  15 mL Mouth Rinse BID  .  Chlorhexidine Gluconate Cloth  6 each Topical Daily  . collagenase   Topical Daily  . docusate  100 mg Per Tube BID  . feeding supplement (PROSource TF)  45 mL Per Tube TID  . folic acid  1 mg Per Tube Daily  . heparin injection (subcutaneous)  5,000 Units Subcutaneous Q8H  . influenza vac split quadrivalent PF  0.5 mL Intramuscular Tomorrow-1000  . insulin aspart  0-20 Units Subcutaneous Q4H  . insulin aspart  3 Units Subcutaneous Q4H  . insulin glargine  20 Units Subcutaneous Daily  . levETIRAcetam  500 mg Per Tube BID  . mouth rinse  15 mL Mouth Rinse 10 times per day  . nutrition supplement (JUVEN)  1 packet Per Tube BID BM  . pantoprazole sodium  40 mg Per Tube QHS  . polyethylene glycol  17 g Per Tube BID  . sodium chloride flush  10-40 mL Intracatheter Q12H  . sorbitol, milk of mag, mineral oil, glycerin (SMOG) enema  960 mL Rectal Once    Antimicrobials: Anti-infectives (From admission, onward)   Start     Dose/Rate Route Frequency Ordered Stop   04/14/20 1715  fluconazole (DIFLUCAN) tablet 150 mg        150 mg Per Tube  Once 04/14/20 1614 04/14/20 1813   04/04/20 1000  ceFAZolin (ANCEF) IVPB 2g/100  mL premix        2 g 200 mL/hr over 30 Minutes Intravenous To Radiology 04/04/20 0935 04/04/20 1053   04/03/20 0600  ceFAZolin (ANCEF) IVPB 1 g/50 mL premix        1 g 100 mL/hr over 30 Minutes Intravenous To Radiology 04/01/20 1239 04/04/20 0600   03/24/20 1030  vancomycin (VANCOREADY) IVPB 1500 mg/300 mL  Status:  Discontinued        1,500 mg 150 mL/hr over 120 Minutes Intravenous Every 8 hours 03/24/20 0952 03/24/20 1336   03/23/20 0130  vancomycin (VANCOCIN) IVPB 1000 mg/200 mL premix  Status:  Discontinued        1,000 mg 200 mL/hr over 60 Minutes Intravenous Every 8 hours 03/22/20 1647 03/24/20 0952   03/22/20 2200  cefTRIAXone (ROCEPHIN) 2 g in sodium chloride 0.9 % 100 mL IVPB        2 g 200 mL/hr over 30 Minutes Intravenous Every 12 hours 03/22/20 1617     03/22/20  1730  vancomycin (VANCOREADY) IVPB 1750 mg/350 mL        1,750 mg 175 mL/hr over 120 Minutes Intravenous  Once 03/22/20 1647 03/22/20 2030   03/22/20 1630  metroNIDAZOLE (FLAGYL) IVPB 500 mg  Status:  Discontinued        500 mg 100 mL/hr over 60 Minutes Intravenous Every 8 hours 03/22/20 1618 03/24/20 1350   03/22/20 1336  bacitracin 50,000 Units in sodium chloride 0.9 % 500 mL irrigation  Status:  Discontinued          As needed 03/22/20 1336 03/22/20 1341   03/22/20 1315  vancomycin (VANCOCIN) IVPB 1000 mg/200 mL premix  Status:  Discontinued        1,000 mg 200 mL/hr over 60 Minutes Intravenous To Surgery 03/22/20 1313 03/22/20 1545   03/22/20 1315  ceFEPIme (MAXIPIME) 2 g in sodium chloride 0.9 % 100 mL IVPB        2 g 200 mL/hr over 30 Minutes Intravenous To Surgery 03/22/20 1313 03/22/20 1354   03/22/20 1145  ceFAZolin (ANCEF) IVPB 2g/100 mL premix  Status:  Discontinued        2 g 200 mL/hr over 30 Minutes Intravenous  Once 03/22/20 1131 03/22/20 1545   03/22/20 1138  ceFAZolin (ANCEF) 2-4 GM/100ML-% IVPB       Note to Pharmacy: Gleason, Ginger   : cabinet override      03/22/20 1138 03/22/20 2344      PRN meds: acetaminophen (TYLENOL) oral liquid 160 mg/5 mL, acetaminophen **OR** acetaminophen, hydrALAZINE, labetalol, ondansetron (ZOFRAN) IV, polyethylene glycol, promethazine, sodium chloride flush   Objective: Vitals:   04/20/20 0719 04/20/20 1225  BP: 125/76   Pulse: 86   Resp: (!) 24   Temp:  98.8 F (37.1 C)  SpO2: 100%     Intake/Output Summary (Last 24 hours) at 04/20/2020 1425 Last data filed at 04/20/2020 1406 Gross per 24 hour  Intake 10 ml  Output 1250 ml  Net -1240 ml   Filed Weights   04/15/20 0156 04/17/20 0034 04/17/20 0208  Weight: 72.2 kg 75.8 kg 75.8 kg   Weight change:  Body mass index is 26.17 kg/m.   Physical Exam: General exam: Poor neurological status. Not in physical distress Skin: No rashes, lesions or ulcers. HEENT: Atraumatic,  normocephalic,no obvious bleeding.  Trach collar anteriorly Lungs: Clear to auscultation bilaterally CVS: Regular rate and rhythm, no murmur GI/Abd soft, nontender, nondistended, bowel sound present CNS: Poor neurological status.  Barely  tries to open eyes on sternal rub. Unable to follow any commands or have a conversation. Psychiatry: Unable to examine Extremities: No pedal edema, no calf tenderness  Data Review: I have personally reviewed the laboratory data and studies available.  Recent Labs  Lab 04/19/20 0200  WBC 2.9*  NEUTROABS 1.0*  HGB 10.6*  HCT 32.8*  MCV 92.9  PLT 303   Recent Labs  Lab 04/19/20 0200  NA 139  K 3.6  CL 104  CO2 26  GLUCOSE 100*  BUN 11  CREATININE 0.33*  CALCIUM 8.0*  MG 1.9  PHOS 3.5   Signed, Terrilee Croak, MD [facility] 04/20/2020

## 2020-04-20 NOTE — Progress Notes (Signed)
Occupational Therapy Treatment Patient Details Name: Vickie Alvarado MRN: [id number] DOB: 30-Apr-1966 Today's Date: 04/20/2020    History of present illness 54 y/o F with a history of HTN, HLD, COVID-19 infection (03/01/20), dental infections, and sinus infections presented to [facility] after being found altered with R sided weakness, dysarthria, and R facial droop. Patient had decreased  P.O. intake, decreased appetite, and projectile vomiting in addition to her other symptoms. 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 Neurosurgery performed a L frontal stereotactic biopsy/aspiration of the abscess. 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 ETT 8/28, trach 9/6.   OT comments  Pt. Seen for skilled OT treatment session. 2nd oldest dtr. Present and active in session.  Education provided along with demo on ROM tech. For B UES along with positioning for contracture/skin care management and prevention.    Follow Up Recommendations  LTACH;SNF;Supervision/Assistance - 24 hour    Equipment Recommendations  Wheelchair (measurements OT);Hospital bed;Wheelchair cushion (measurements OT);Other (comment)    Recommendations for Other Services      Precautions / Restrictions Precautions Precautions: Fall Precaution Comments: trach; peg Required Braces or Orthoses: Other Brace       Mobility Bed Mobility                  Transfers                      Balance                                           ADL either performed or assessed with clinical judgement   ADL                                               Vision       Perception     Praxis      Cognition Arousal/Alertness: Awake/alert Behavior During Therapy: Flat affect Overall Cognitive Status: Impaired/Different from  baseline                                          Exercises General Exercises - Upper Extremity Shoulder Flexion: PROM;Both;5 reps;Supine Shoulder Extension: PROM;Both;5 reps;Supine Elbow Flexion: PROM;Both;5 reps Elbow Extension: PROM;Both;5 reps Wrist Flexion: PROM;5 reps;Both;Supine Wrist Extension: PROM;5 reps;Both;Supine Digit Composite Flexion: PROM;Both;5 reps;Supine Composite Extension: PROM;Both;15 reps;Supine Other Exercises Other Exercises: BUE PROM WFL; tone flutuates; appear to be moving spontaneously, R>L. At times appears to be moving R UE on command with delay Other Exercises: pts. 2nd oldest dtr. present for session. active in care.  educated on rom tech. for digits/wrist/elbow/shoulder for contracture prevention along with skin care management/integrity through proper cleaning of hands/palms.  educated to watch for lenght of nails esp. R hand with notable flexed grasp tendancy.  also reviewed pillow placement of bues.  pt. with head neck to L.  education to position in neutral position and also rotate to R.   Shoulder Instructions       General Comments  dtr. Reviewed family tree with me with use of the family collage in room. Large close family lots of love and support. They have also experienced loss of two family members recently.  (one, pts. Son and the other pts. G.son the 2nd oldest dtrs. Son.)  Lots of g.children and a some great grands too! Pt. And family love music. The son that passed away was a Information systems manager.      Pertinent Vitals/ Pain       Pain Assessment: Faces Pain Score: 0-No pain  Home Living                                          Prior Functioning/Environment              Frequency  Min 2X/week        Progress Toward Goals  OT Goals(current goals can now be found in the care plan section)  Progress towards OT goals: Progressing toward goals     Plan Discharge plan remains appropriate     Co-evaluation                 AM-PAC OT "6 Clicks" Daily Activity     Outcome Measure   Help from another person eating meals?: Total Help from another person taking care of personal grooming?: Total Help from another person toileting, which includes using toliet, bedpan, or urinal?: Total Help from another person bathing (including washing, rinsing, drying)?: Total Help from another person to put on and taking off regular upper body clothing?: Total Help from another person to put on and taking off regular lower body clothing?: Total 6 Click Score: 6    End of Session    OT Visit Diagnosis: Muscle weakness (generalized) (M62.81);Other abnormalities of gait and mobility (R26.89);Apraxia (R48.2);Ataxia, unspecified (R27.0);Feeding difficulties (R63.3);Other symptoms and signs involving cognitive function;Other symptoms and signs involving the nervous system (R29.898)   Activity Tolerance Patient tolerated treatment well   Patient Left in bed;with call bell/phone within reach;with family/visitor present   Nurse Communication          Time: 4332-9518 OT Time Calculation (min): 21 min  Charges: OT General Charges $OT Visit: 1 Visit OT Treatments $Therapeutic Exercise: 8-22 mins  Boneta Lucks, COTA/L Acute Rehabilitation [phone]   Robet Leu 04/20/2020, 2:55 PM

## 2020-04-20 NOTE — Progress Notes (Signed)
Physical Therapy Wound Treatment Patient Details  Name: Vickie Alvarado MRN: [id number] Date of Birth: [birth year]  Today's Date: 04/20/2020 Time: 9024-0973 Time Calculation (min): 37 min  Subjective  Subjective: Pt with bowel incontinence upon entry; provided peri care  Patient and Family Stated Goals: None stated Date of Onset:  (Unknown)  Pain Score:  0/10  Wound Assessment  Wound / Incision (Open or Dehisced) 04/09/20 Other (Comment) Coccyx Unstageable when assessed on 04/16/20 (Active)  Dressing Type ABD;Barrier Film (skin prep);Gauze (Comment);Moist to moist 04/20/20 1632  Dressing Changed Changed 04/20/20 1632  Dressing Status Clean;Dry;Intact 04/20/20 1632  Dressing Change Frequency Daily 04/20/20 1632  Site / Wound Assessment Pink;Yellow;Black 04/20/20 1632  % Wound base Red or Granulating 30% 04/19/20 1443  % Wound base Yellow/Fibrinous Exudate 50% 04/19/20 1443  % Wound base Black/Eschar 20% 04/19/20 1443  % Wound base Other/Granulation Tissue (Comment) 0% 04/19/20 1443  Peri-wound Assessment Intact 04/20/20 1632  Wound Length (cm) 7.5 cm 04/17/20 0900  Wound Width (cm) 9 cm 04/17/20 0900  Wound Depth (cm) 0.2 cm 04/17/20 0900  Wound Volume (cm^3) 13.5 cm^3 04/17/20 0900  Wound Surface Area (cm^2) 67.5 cm^2 04/17/20 0900  Margins Unattached edges (unapproximated) 04/20/20 1632  Closure None 04/19/20 1443  Drainage Amount Moderate 04/20/20 1632  Drainage Description Serosanguineous 04/20/20 1632  Treatment Debridement (Selective);Hydrotherapy (Pulse lavage);Packing (Saline gauze) 04/20/20 1632     Hydrotherapy Pulsed lavage therapy - wound location: Sacrum Pulsed Lavage with Suction (psi): 12 psi Pulsed Lavage with Suction - Normal Saline Used: 1000 mL Pulsed Lavage Tip: Tip with splash shield Selective Debridement Selective Debridement - Location: Sacrum Selective Debridement - Tools Used: Forceps;Scalpel Selective Debridement - Tissue Removed: Black necrotic  tissue and eschar   Wound Assessment and Plan  Wound Therapy - Assess/Plan/Recommendations Wound Therapy - Clinical Statement: Wound progressing with selective debridement. Will follow for selective removal of unviable tissue, to decrease bioburden, and promote wound bed healing.  Wound Therapy - Functional Problem List: Decreased overall mobility - unresponsive on vent.  Factors Delaying/Impairing Wound Healing: Immobility;Multiple medical problems Hydrotherapy Plan: Debridement;Dressing change;Patient/family education;Pulsatile lavage with suction Wound Therapy - Frequency: 6X / week Wound Therapy - Follow Up Recommendations: Skilled nursing facility Wound Plan: See above  Wound Therapy Goals- Improve the function of patient's integumentary system by progressing the wound(s) through the phases of wound healing (inflammation - proliferation - remodeling) by: Decrease Necrotic Tissue to: 20% Decrease Necrotic Tissue - Progress: Progressing toward goal Increase Granulation Tissue to: 80% Increase Granulation Tissue - Progress: Progressing toward goal Goals/treatment plan/discharge plan were made with and agreed upon by patient/family: No, Patient unable to participate in goals/treatment/discharge plan and family unavailable Time For Goal Achievement: 7 days Wound Therapy - Potential for Goals: Fair  Goals will be updated until maximal potential achieved or discharge criteria met.  Discharge criteria: when goals achieved, discharge from hospital, MD decision/surgical intervention, no progress towards goals, refusal/missing three consecutive treatments without notification or medical reason.  GP    Wyona Almas, PT, DPT Acute Rehabilitation Services Pager [phone] Office [phone]      Deno Etienne 04/20/2020, 4:40 PM

## 2020-04-21 DIAGNOSIS — G060 Intracranial abscess and granuloma: Secondary | ICD-10-CM | POA: Diagnosis not present

## 2020-04-21 LAB — GLUCOSE, CAPILLARY
Glucose-Capillary: 110 mg/dL — ABNORMAL HIGH (ref 70–99)
Glucose-Capillary: 121 mg/dL — ABNORMAL HIGH (ref 70–99)
Glucose-Capillary: 121 mg/dL — ABNORMAL HIGH (ref 70–99)
Glucose-Capillary: 69 mg/dL — ABNORMAL LOW (ref 70–99)
Glucose-Capillary: 173 mg/dL — ABNORMAL HIGH (ref 70–99)
Glucose-Capillary: 122 mg/dL — ABNORMAL HIGH (ref 70–99)

## 2020-04-21 MED ORDER — DEXTROSE 50 % IV SOLN
INTRAVENOUS | Status: AC
  Filled 2020-04-21: qty 50

## 2020-04-21 NOTE — Progress Notes (Signed)
Inrfluenza vaccine give in left deltoid at 1345. Lot #; [id number] and expire 01/23/2021. [facility] RN.

## 2020-04-21 NOTE — Progress Notes (Signed)
PROGRESS NOTE  Vickie Alvarado  DOB: [birth year]  PCP: [facility] MVH:[id number]  DOA: 03/20/2020  LOS: 32 days   Chief complaint: Right-sided weakness, dysarthria, right facial droop  Brief narrative: Vickie Alvarado is a 54 y.o. year old female with medical history significant for HTN, HLD, COVID-19 infection (diagnosed 03/01/2020), dental infection and sinus infections.  Timeline of events:  8/25, patient presented with right-sided weakness, dysarthria and right facial droop also to have abscess in the left basal ganglia.  Patient was admitted to the hospital.  8/27, underwent stereotactic biopsy and aspiration of the abscess by neurosurgery and was transferred to ICU.  Wound culture resulted Streptococcus intermedius  8/27, MRI brain was obtained for worsening mental status.  It showed new large left PCA infarct along with scattered infarcts in the right basal ganglia and right thalamus concerning for either CVA related to septic emboli versus herniation syndrome. Neurosurgery believed patient developed the new development of the posterior cerebral artery distribution infarct due to to sequelae from herniation. Given her large hemisphere brain abscess with surrounding edema/cerebritis neurosurgery did not recommend decompressive surgery.  Patient was placed on a tapering course of Decadron which she completed by now.  8/28, PICC line placed  9/6, tracheostomy  9/9, PEG placement and tube feeding started  9/13, repeat CT head showed improvement in fluid collections blood showed persistent left parietal/occipital acute/subacute infarcts with improvement in midline shift.  Subjective: Patient was seen and examined this morning. Lying down in bed.  Eyes closed.  Review was on sternal rub.  No other response.  No family at bedside Vital signs stable.  On 5 L oxygen by trach collar.  Assessment/Plan: Acute toxic metabolic encephalopathy -Patient has poor  neurological status.  At the best, she can open eyes but that is inconsistent as well.  Unable to follow any other commands.  -Multifactorial: Brain abscess, brain tissue herniation, vasogenic edema, CVA -Neurological status is poor. Unlikely to have meaningful recovery.  However patient's family remains hopeful.  PT/OT/palliative care on board. 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 -On IV Rocephin for 8 weeks course as directed by ID to complete on 05/17/2020. -Completed a course of Decadron. -Continue Keppra 500 mg twice daily for seizure prophylaxis.  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  Acute hypoxic respiratory failure -status post trach 04/01/2020 -stable 4-5 L of O2 on trach collar.  -PCCM following.  Trach tube downsized to 6 -0 cuffless.  Continue 4-5 L oxygen by trach collar.  Abdominal distention, stable.   9/22, repeat abdominal x-ray showed persistent gaseous distention of the colon.  Currently on motility agents. Continue MiraLAX, Senokot.  Nutrition -Continue Prosource via feeding tube  GERD, stable -continue PPI per tube  Poorly controlled diabetes mellitus  -A1c 11.5  -Blood sugar currently controlled on  Lantus 20 units daily and NovoLog 3 units every 4 hours while on tube feeds. -Continue Accu-Cheks. Lab Results  Component Value Date   HGBA1C 11.5 (H) 03/22/2020   Recent Labs  Lab 04/20/20 1954 04/20/20 2340 04/21/20 0331 04/21/20 0750 04/21/20 1205  GLUCAP 103* 127* 110* 121* 121*   Anemia of chronic disease -Hemoglobin stable.  Vitamin B12, folate and iron level adequate. Recent Labs    03/26/20 0531 03/27/20 0443  03/28/20 0409 03/29/20 0515 03/31/20 1016  04/01/20 0620 04/04/20 1446 04/07/20 0435 04/13/20 0500 04/19/20 0200  HGB 9.5* 9.8* 10.6* 11.2* 11.6* 11.2* 11.6* 10.0* 10.6* 10.6*   History of bipolar disorder, stable -Seroquel on hold.  Currently avoiding any sedative medication.  Pressure ulcer in gluteal cleft/sacral area,   -consistent with stage 2 on my exam (no full loss of dermis, no fat exposure) difficult to promote healing given patient is incontinent of stool -Wound consulted, follow recommendations for cleaning, and foam dressing to sacrum every day  Goals of care -Currently remains full code.  Family remains hopeful despite low likelihood of meaningful recovery.  Palliative care consult appreciated patient remains full code.  Mobility: PT eval when able to follow command Code Status:   Code Status: Full Code  Nutritional status: Body mass index is 26.17 kg/m. Nutrition Problem: Increased nutrient needs Etiology: post-op healing Signs/Symptoms: estimated needs Diet Order            Diet NPO time specified  Diet effective midnight                 DVT prophylaxis: heparin injection 5,000 Units Start: 04/04/20 2200 SCDs Start: 03/20/20 1640   Antimicrobials:  IV Rocephin Fluid: D5 LR at 30 ml/hour along with tube feeding at 65 ml/h. Consultants: ID, neurosurgery, neurology Family Communication:  Daughter at bedside  Status is: Inpatient  Remains inpatient appropriate because: SNF not available yet  Dispo: The patient is from: Home              Anticipated d/c is to: SNF              Anticipated d/c date is: Whenever bed is available              Patient currently is medically stable to d/c.   Infusions:  . cefTRIAXone (ROCEPHIN)  IV 2 g (04/21/20 0828)  . dextrose 5% lactated ringers 30 mL/hr at 04/18/20 1117  . feeding supplement (JEVITY 1.5 CAL/FIBER) 1,000 mL (04/20/20 1706)    Scheduled Meds: . chlorhexidine gluconate (MEDLINE KIT)  15 mL Mouth Rinse BID  . Chlorhexidine Gluconate Cloth   6 each Topical Daily  . collagenase   Topical Daily  . docusate  100 mg Per Tube BID  . feeding supplement (PROSource TF)  45 mL Per Tube TID  . folic acid  1 mg Per Tube Daily  . heparin injection (subcutaneous)  5,000 Units Subcutaneous Q8H  . influenza vac split quadrivalent PF  0.5 mL Intramuscular Tomorrow-1000  . insulin aspart  0-20 Units Subcutaneous Q4H  . insulin aspart  3 Units Subcutaneous Q4H  . insulin glargine  20 Units Subcutaneous Daily  . levETIRAcetam  500 mg Per Tube BID  . mouth rinse  15 mL Mouth Rinse 10 times per day  . nutrition supplement (JUVEN)  1 packet Per Tube BID BM  . pantoprazole sodium  40 mg Per Tube QHS  . polyethylene glycol  17 g Per Tube BID  . sodium chloride flush  10-40 mL Intracatheter Q12H  . sorbitol, milk of mag, mineral oil, glycerin (SMOG) enema  960 mL Rectal Once    Antimicrobials: Anti-infectives (From admission, onward)   Start     Dose/Rate Route Frequency Ordered Stop   04/14/20 1715  fluconazole (DIFLUCAN) tablet 150 mg        150 mg Per Tube  Once 04/14/20 1614 04/14/20 1813   04/04/20 1000  ceFAZolin (ANCEF) IVPB 2g/100 mL  premix        2 g 200 mL/hr over 30 Minutes Intravenous To Radiology 04/04/20 0935 04/04/20 1053   04/03/20 0600  ceFAZolin (ANCEF) IVPB 1 g/50 mL premix        1 g 100 mL/hr over 30 Minutes Intravenous To Radiology 04/01/20 1239 04/04/20 0600   03/24/20 1030  vancomycin (VANCOREADY) IVPB 1500 mg/300 mL  Status:  Discontinued        1,500 mg 150 mL/hr over 120 Minutes Intravenous Every 8 hours 03/24/20 0952 03/24/20 1336   03/23/20 0130  vancomycin (VANCOCIN) IVPB 1000 mg/200 mL premix  Status:  Discontinued        1,000 mg 200 mL/hr over 60 Minutes Intravenous Every 8 hours 03/22/20 1647 03/24/20 0952   03/22/20 2200  cefTRIAXone (ROCEPHIN) 2 g in sodium chloride 0.9 % 100 mL IVPB        2 g 200 mL/hr over 30 Minutes Intravenous Every 12 hours 03/22/20 1617     03/22/20 1730  vancomycin (VANCOREADY)  IVPB 1750 mg/350 mL        1,750 mg 175 mL/hr over 120 Minutes Intravenous  Once 03/22/20 1647 03/22/20 2030   03/22/20 1630  metroNIDAZOLE (FLAGYL) IVPB 500 mg  Status:  Discontinued        500 mg 100 mL/hr over 60 Minutes Intravenous Every 8 hours 03/22/20 1618 03/24/20 1350   03/22/20 1336  bacitracin 50,000 Units in sodium chloride 0.9 % 500 mL irrigation  Status:  Discontinued          As needed 03/22/20 1336 03/22/20 1341   03/22/20 1315  vancomycin (VANCOCIN) IVPB 1000 mg/200 mL premix  Status:  Discontinued        1,000 mg 200 mL/hr over 60 Minutes Intravenous To Surgery 03/22/20 1313 03/22/20 1545   03/22/20 1315  ceFEPIme (MAXIPIME) 2 g in sodium chloride 0.9 % 100 mL IVPB        2 g 200 mL/hr over 30 Minutes Intravenous To Surgery 03/22/20 1313 03/22/20 1354   03/22/20 1145  ceFAZolin (ANCEF) IVPB 2g/100 mL premix  Status:  Discontinued        2 g 200 mL/hr over 30 Minutes Intravenous  Once 03/22/20 1131 03/22/20 1545   03/22/20 1138  ceFAZolin (ANCEF) 2-4 GM/100ML-% IVPB       Note to Pharmacy: Gleason, Ginger   : cabinet override      03/22/20 1138 03/22/20 2344      PRN meds: acetaminophen (TYLENOL) oral liquid 160 mg/5 mL, acetaminophen **OR** acetaminophen, hydrALAZINE, labetalol, ondansetron (ZOFRAN) IV, polyethylene glycol, promethazine, sodium chloride flush   Objective: Vitals:   04/21/20 0756 04/21/20 1205  BP:    Pulse: 83   Resp:    Temp:  98.9 F (37.2 C)  SpO2: 100% 100%    Intake/Output Summary (Last 24 hours) at 04/21/2020 1254 Last data filed at 04/21/2020 0610 Gross per 24 hour  Intake --  Output 1450 ml  Net -1450 ml   Filed Weights   04/15/20 0156 04/17/20 0034 04/17/20 0208  Weight: 72.2 kg 75.8 kg 75.8 kg   Weight change:  Body mass index is 26.17 kg/m.   Physical Exam: General exam: Poor neurological status. Not in physical distress.  Skin: No rashes, lesions or ulcers. HEENT: Atraumatic, normocephalic,no obvious bleeding.  Trach  collar anteriorly Lungs: Clear to auscultation bilaterally CVS: Regular rate and rhythm, no murmur GI/Abd soft, nontender, nondistended, bowel sound present CNS: Poor neurological status.  Opens eyes on  sternal rub.  Unable to follow any commands or have a conversation. Psychiatry: Unable to examine Extremities: No pedal edema, no calf tenderness  Data Review: I have personally reviewed the laboratory data and studies available.  Recent Labs  Lab 04/19/20 0200  WBC 2.9*  NEUTROABS 1.0*  HGB 10.6*  HCT 32.8*  MCV 92.9  PLT 303   Recent Labs  Lab 04/19/20 0200  NA 139  K 3.6  CL 104  CO2 26  GLUCOSE 100*  BUN 11  CREATININE 0.33*  CALCIUM 8.0*  MG 1.9  PHOS 3.5   Signed, Terrilee Croak, MD [facility] 04/21/2020

## 2020-04-22 DIAGNOSIS — G060 Intracranial abscess and granuloma: Secondary | ICD-10-CM | POA: Diagnosis not present

## 2020-04-22 LAB — GLUCOSE, CAPILLARY
Glucose-Capillary: 108 mg/dL — ABNORMAL HIGH (ref 70–99)
Glucose-Capillary: 113 mg/dL — ABNORMAL HIGH (ref 70–99)
Glucose-Capillary: 118 mg/dL — ABNORMAL HIGH (ref 70–99)
Glucose-Capillary: 118 mg/dL — ABNORMAL HIGH (ref 70–99)
Glucose-Capillary: 133 mg/dL — ABNORMAL HIGH (ref 70–99)
Glucose-Capillary: 99 mg/dL (ref 70–99)

## 2020-04-22 NOTE — Consult Note (Addendum)
WOC Nurse wound follow up Patient receiving care in room [facility] 2W23 Sacral wound evaluated in room. PT preparing to perform hydrotherapy. Purwick in place but leaking. Patient found to be saturated in urine. Patient cleaned and chucks under patient replaced. Foley catheter recommended for wound healing. Nurse will page MD for potential order.  See PT note from today for wound care, photos, measurements and dressing change.  Continue enzymatic debridement to area as ordered. WOC nurses will continue to follow weekly.  Please re-consult the WOC team if needed.  [name] [name], MSN, RN, CMSRN, Angus Seller, [facility] Wound Treatment Associate Pager [phone]

## 2020-04-22 NOTE — Progress Notes (Signed)
PROGRESS NOTE  Vickie Alvarado  DOB: Jan 30, 1966  PCP: [facility] ERX:[id number]  DOA: 03/20/2020  LOS: 33 days   Chief complaint: Right-sided weakness, dysarthria, right facial droop  Brief narrative: Vickie Alvarado is a 54 y.o. year old female with medical history significant for HTN, HLD, COVID-19 infection (diagnosed 03/01/2020), dental infection and sinus infections.  Timeline of events:  8/25, patient presented with right-sided weakness, dysarthria and right facial droop also to have abscess in the left basal ganglia.  Patient was admitted to the hospital.  8/27, underwent stereotactic biopsy and aspiration of the abscess by neurosurgery and was transferred to ICU.  Wound culture resulted Streptococcus intermedius  8/27, MRI brain was obtained for worsening mental status.  It showed new large left PCA infarct along with scattered infarcts in the right basal ganglia and right thalamus concerning for either CVA related to septic emboli versus herniation syndrome. Neurosurgery believed patient developed the new development of the posterior cerebral artery distribution infarct due to to sequelae from herniation. Given her large hemisphere brain abscess with surrounding edema/cerebritis neurosurgery did not recommend decompressive surgery.  Patient was placed on a tapering course of Decadron which she completed by now.  8/28, PICC line placed  9/6, tracheostomy  9/9, PEG placement and tube feeding started  9/13, repeat CT head showed improvement in fluid collections blood showed persistent left parietal/occipital acute/subacute infarcts with improvement in midline shift.  Subjective: Patient was seen and examined this morning. Lying in bed.  Eyes open.  Unable to follow commands.  Not in distress. No family at bedside Vital signs stable.  On 5 L oxygen by trach collar. Per RN, patient is having diarrhea.  Assessment/Plan: Acute toxic metabolic  encephalopathy -Patient has poor neurological status.  At the best, she can open eyes but that is inconsistent as well.  Unable to follow any other commands.  -Multifactorial: Brain abscess, brain tissue herniation, vasogenic edema, CVA -Neurological status is poor. Unlikely to have meaningful recovery.  However patient's family remains hopeful.  PT/OT/palliative care on board. 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 -On IV Rocephin for 8 weeks course as directed by ID to complete on 05/17/2020. -Completed a course of Decadron. -Continue Keppra 500 mg twice daily for seizure prophylaxis.  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  Acute hypoxic respiratory failure -status post trach 04/01/2020 -stable 4-5 L of O2 on trach collar.  -PCCM following.  Trach tube downsized to 6 -0 cuffless.  Continue 4-5 L oxygen by trach collar.  Diarrhea -Secondary to schedule laxatives.  Change to as needed.  Nutrition -Continue Prosource via feeding tube  GERD, stable -continue PPI per tube  Poorly controlled diabetes mellitus  -A1c 11.5  -Blood sugar currently controlled on  Lantus 20 units daily and NovoLog 3 units every 4 hours while on tube feeds. -Continue Accu-Cheks. Lab Results  Component Value Date   HGBA1C 11.5 (H) 03/22/2020   Recent Labs  Lab 04/21/20 1932 04/22/20 0010 04/22/20 0359 04/22/20 0720 04/22/20 1201  GLUCAP 122* 113* 118* 133* 108*   Anemia of chronic disease -Hemoglobin stable.  Vitamin B12, folate and iron level adequate. Recent Labs    03/26/20 0531 03/27/20 0443 03/28/20 0409 03/29/20 0515 03/31/20 1016 04/01/20 0620 04/04/20 1446 04/07/20  0435 04/13/20 0500 04/19/20 0200   HGB 9.5* 9.8* 10.6* 11.2* 11.6* 11.2* 11.6* 10.0* 10.6* 10.6*   History of bipolar disorder, stable -Seroquel on hold.  Currently avoiding any sedative medication.  Pressure ulcer in gluteal cleft/sacral area,   -consistent with stage 2 on my exam (no full loss of dermis, no fat exposure) difficult to promote healing given patient is incontinent of stool -Wound consulted, follow recommendations for cleaning, and foam dressing to sacrum every day  Goals of care -Currently remains full code.  Family remains hopeful despite low likelihood of meaningful recovery.  Palliative care consult appreciated patient remains full code.  Mobility: PT eval when able to follow command Code Status:   Code Status: Full Code  Nutritional status: Body mass index is 26.17 kg/m. Nutrition Problem: Increased nutrient needs Etiology: post-op healing Signs/Symptoms: estimated needs Diet Order            Diet NPO time specified  Diet effective midnight                 DVT prophylaxis: heparin injection 5,000 Units Start: 04/04/20 2200 SCDs Start: 03/20/20 1640   Antimicrobials:  IV Rocephin Fluid: tube feeding at 65 ml/h. Consultants: ID, neurosurgery, neurology Family Communication:  Daughter at bedside  Status is: Inpatient  Remains inpatient appropriate because: SNF not available yet  Dispo: The patient is from: Home              Anticipated d/c is to: SNF              Anticipated d/c date is: Whenever bed is available              Patient currently is medically stable to d/c.   Infusions:  . cefTRIAXone (ROCEPHIN)  IV 2 g (04/22/20 1008)  . dextrose 5% lactated ringers 30 mL/hr at 04/22/20 0443  . feeding supplement (JEVITY 1.5 CAL/FIBER) 1,000 mL (04/21/20 1745)    Scheduled Meds: . chlorhexidine gluconate (MEDLINE KIT)  15 mL Mouth Rinse BID  . Chlorhexidine Gluconate Cloth  6 each Topical Daily  . collagenase   Topical Daily  . docusate  100 mg Per Tube BID  . feeding  supplement (PROSource TF)  45 mL Per Tube TID  . folic acid  1 mg Per Tube Daily  . heparin injection (subcutaneous)  5,000 Units Subcutaneous Q8H  . insulin aspart  0-20 Units Subcutaneous Q4H  . insulin aspart  3 Units Subcutaneous Q4H  . insulin glargine  20 Units Subcutaneous Daily  . levETIRAcetam  500 mg Per Tube BID  . mouth rinse  15 mL Mouth Rinse 10 times per day  . nutrition supplement (JUVEN)  1 packet Per Tube BID BM  . pantoprazole sodium  40 mg Per Tube QHS  . sodium chloride flush  10-40 mL Intracatheter Q12H  . sorbitol, milk of mag, mineral oil, glycerin (SMOG) enema  960 mL Rectal Once    Antimicrobials: Anti-infectives (From admission, onward)   Start     Dose/Rate Route Frequency Ordered Stop   04/14/20 1715  fluconazole (DIFLUCAN) tablet 150 mg        150 mg Per Tube  Once 04/14/20 1614 04/14/20 1813   04/04/20 1000  ceFAZolin (ANCEF) IVPB 2g/100 mL premix        2 g 200 mL/hr over 30 Minutes Intravenous To Radiology 04/04/20 0935 04/04/20 1053   04/03/20 0600  ceFAZolin (ANCEF) IVPB 1 g/50 mL premix  1 g 100 mL/hr over 30 Minutes Intravenous To Radiology 04/01/20 1239 04/04/20 0600   03/24/20 1030  vancomycin (VANCOREADY) IVPB 1500 mg/300 mL  Status:  Discontinued        1,500 mg 150 mL/hr over 120 Minutes Intravenous Every 8 hours 03/24/20 0952 03/24/20 1336   03/23/20 0130  vancomycin (VANCOCIN) IVPB 1000 mg/200 mL premix  Status:  Discontinued        1,000 mg 200 mL/hr over 60 Minutes Intravenous Every 8 hours 03/22/20 1647 03/24/20 0952   03/22/20 2200  cefTRIAXone (ROCEPHIN) 2 g in sodium chloride 0.9 % 100 mL IVPB        2 g 200 mL/hr over 30 Minutes Intravenous Every 12 hours 03/22/20 1617     03/22/20 1730  vancomycin (VANCOREADY) IVPB 1750 mg/350 mL        1,750 mg 175 mL/hr over 120 Minutes Intravenous  Once 03/22/20 1647 03/22/20 2030   03/22/20 1630  metroNIDAZOLE (FLAGYL) IVPB 500 mg  Status:  Discontinued        500 mg 100 mL/hr over  60 Minutes Intravenous Every 8 hours 03/22/20 1618 03/24/20 1350   03/22/20 1336  bacitracin 50,000 Units in sodium chloride 0.9 % 500 mL irrigation  Status:  Discontinued          As needed 03/22/20 1336 03/22/20 1341   03/22/20 1315  vancomycin (VANCOCIN) IVPB 1000 mg/200 mL premix  Status:  Discontinued        1,000 mg 200 mL/hr over 60 Minutes Intravenous To Surgery 03/22/20 1313 03/22/20 1545   03/22/20 1315  ceFEPIme (MAXIPIME) 2 g in sodium chloride 0.9 % 100 mL IVPB        2 g 200 mL/hr over 30 Minutes Intravenous To Surgery 03/22/20 1313 03/22/20 1354   03/22/20 1145  ceFAZolin (ANCEF) IVPB 2g/100 mL premix  Status:  Discontinued        2 g 200 mL/hr over 30 Minutes Intravenous  Once 03/22/20 1131 03/22/20 1545   03/22/20 1138  ceFAZolin (ANCEF) 2-4 GM/100ML-% IVPB       Note to Pharmacy: Gleason, Ginger   : cabinet override      03/22/20 1138 03/22/20 2344      PRN meds: acetaminophen (TYLENOL) oral liquid 160 mg/5 mL, acetaminophen **OR** acetaminophen, hydrALAZINE, labetalol, ondansetron (ZOFRAN) IV, polyethylene glycol, promethazine, sodium chloride flush   Objective: Vitals:   04/22/20 0800 04/22/20 1201  BP:  139/81  Pulse: 89 99  Resp: (!) 21 20  Temp:  98.8 F (37.1 C)  SpO2: 98% 98%    Intake/Output Summary (Last 24 hours) at 04/22/2020 1421 Last data filed at 04/22/2020 0425 Gross per 24 hour  Intake 10 ml  Output 750 ml  Net -740 ml   Filed Weights   04/15/20 0156 04/17/20 0034 04/17/20 0208  Weight: 72.2 kg 75.8 kg 75.8 kg   Weight change:  Body mass index is 26.17 kg/m.   Physical Exam: General exam: Poor neurological status. Not in physical distress.  Skin: No rashes, lesions or ulcers. HEENT: Atraumatic, normocephalic,no obvious bleeding.  Trach collar anteriorly Lungs: Clear to auscultation bilaterally CVS: Regular rate and rhythm, no murmur GI/Abd soft, nontender, nondistended, bowel sound present CNS: Poor neurological status.  Opens eyes  on sternal rub.  Unable to follow any commands or have a conversation. Psychiatry: Unable to examine Extremities: No pedal edema, no calf tenderness  Data Review: I have personally reviewed the laboratory data and studies available.  Recent Labs  Lab 04/19/20 0200  WBC 2.9*  NEUTROABS 1.0*  HGB 10.6*  HCT 32.8*  MCV 92.9  PLT 303   Recent Labs  Lab 04/19/20 0200  NA 139  K 3.6  CL 104  CO2 26  GLUCOSE 100*  BUN 11  CREATININE 0.33*  CALCIUM 8.0*  MG 1.9  PHOS 3.5   Signed, Terrilee Croak, MD [facility] 04/22/2020

## 2020-04-23 DIAGNOSIS — R404 Transient alteration of awareness: Secondary | ICD-10-CM

## 2020-04-23 DIAGNOSIS — R627 Adult failure to thrive: Secondary | ICD-10-CM | POA: Diagnosis not present

## 2020-04-23 DIAGNOSIS — Z515 Encounter for palliative care: Secondary | ICD-10-CM | POA: Diagnosis not present

## 2020-04-23 DIAGNOSIS — G060 Intracranial abscess and granuloma: Secondary | ICD-10-CM | POA: Diagnosis not present

## 2020-04-23 LAB — GLUCOSE, CAPILLARY
Glucose-Capillary: 103 mg/dL — ABNORMAL HIGH (ref 70–99)
Glucose-Capillary: 116 mg/dL — ABNORMAL HIGH (ref 70–99)
Glucose-Capillary: 115 mg/dL — ABNORMAL HIGH (ref 70–99)
Glucose-Capillary: 116 mg/dL — ABNORMAL HIGH (ref 70–99)
Glucose-Capillary: 113 mg/dL — ABNORMAL HIGH (ref 70–99)
Glucose-Capillary: 107 mg/dL — ABNORMAL HIGH (ref 70–99)

## 2020-04-23 MED ORDER — ACETAMINOPHEN 650 MG RE SUPP: 650.0000 mg | RECTAL | Status: DC | PRN

## 2020-04-23 MED ORDER — ACETAMINOPHEN 160 MG/5ML PO SOLN
650.0000 mg | ORAL | Status: DC | PRN
  Administered 2020-04-26 – 2020-05-03 (×3): 650 mg
  Filled 2020-04-23 (×2): qty 20.3

## 2020-04-23 NOTE — Progress Notes (Signed)
PROGRESS NOTE  Vickie Alvarado  DOB: [birth year]  PCP: [facility] DTO:[id number]  DOA: 03/20/2020  LOS: 34 days   Chief complaint: Right-sided weakness, dysarthria, right facial droop  Brief narrative: Vickie Alvarado is a 54 y.o. year old female with medical history significant for HTN, HLD, COVID-19 infection (diagnosed 03/01/2020), dental infection and sinus infections.  Timeline of events:  8/25, patient presented with right-sided weakness, dysarthria and right facial droop also to have abscess in the left basal ganglia.  Patient was admitted to the hospital.  8/27, underwent stereotactic biopsy and aspiration of the abscess by neurosurgery and was transferred to ICU.  Wound culture resulted Streptococcus intermedius  8/27, MRI brain was obtained for worsening mental status.  It showed new large left PCA infarct along with scattered infarcts in the right basal ganglia and right thalamus concerning for either CVA related to septic emboli versus herniation syndrome. Neurosurgery believed patient developed the new development of the posterior cerebral artery distribution infarct due to to sequelae from herniation. Given her large hemisphere brain abscess with surrounding edema/cerebritis neurosurgery did not recommend decompressive surgery.  Patient was placed on a tapering course of Decadron which she completed by now.  8/28, PICC line placed  9/6, tracheostomy  9/9, PEG placement and tube feeding started  9/13, repeat CT head showed improvement in fluid collections blood showed persistent left parietal/occipital acute/subacute infarcts with improvement in midline shift.  Subjective: Patient was seen and examined this morning. Daughter at bedside.  Another daughter on the phone. Patient was able to gently open her eyes on command.  Eye/spontaneous movement of her left arm but not on command. Remains on 5 L oxygen by nasal cannula. Noted one episode of  temperature one 0.4 yesterday afternoon.  Assessment/Plan: Acute toxic metabolic encephalopathy -Patient has poor neurological status.  At the best, she can open eyes but that is inconsistent as well.  Unable to follow any other commands.  -Multifactorial: Brain abscess, brain tissue herniation, vasogenic edema, CVA -Neurological status is poor with with intermittent spontaneous movements eye-opening but highly unlikely to have meaningful recovery.  However patient's family remains hopeful.  PT/OT/palliative care on board. 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 -On IV Rocephin for 8 weeks course as directed by ID to complete on 05/17/2020. -Completed a course of Decadron. -Continue Keppra 500 mg twice daily for seizure prophylaxis. -One episode temperature 100.4 yesterday.  Continue to monitor.  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  Acute hypoxic respiratory failure -status post trach 04/01/2020 -stable 4-5 L of O2 on trach collar.  -PCCM following.  Trach tube downsized to 6 -0 cuffless.  Continue 4-5 L oxygen by trach collar.  Diarrhea -Secondary to schedule laxatives.  Change to as needed.  Nutrition -Continue Prosource via feeding tube  GERD, stable -continue PPI per tube  Poorly controlled diabetes mellitus  -A1c 11.5  -Blood sugar currently controlled on  Lantus 20 units daily and NovoLog 3 units every 4 hours while on tube feeds. -Continue Accu-Cheks. Lab Results  Component Value Date   HGBA1C 11.5 (H) 03/22/2020   Recent Labs  Lab 04/22/20 1201 04/22/20 1615 04/22/20 2036 04/23/20 0528 04/23/20 0853  GLUCAP 108* 118* 99 103* 116*   Anemia of chronic disease -Hemoglobin  stable.  Vitamin B12, folate and iron level adequate. Recent Labs    03/26/20 0531 03/27/20 0443 03/28/20 0409 03/29/20 0515 03/31/20 1016 04/01/20 0620 04/04/20 1446 04/07/20 0435 04/13/20 0500 04/19/20 0200  HGB 9.5* 9.8* 10.6* 11.2* 11.6* 11.2* 11.6* 10.0* 10.6* 10.6*   History of bipolar disorder, stable -Seroquel on hold.  Currently avoiding any sedative medication.  Pressure ulcer in gluteal cleft/sacral area,   -consistent with stage 2 on my exam (no full loss of dermis, no fat exposure) difficult to promote healing given patient is incontinent of stool -Wound consulted, follow recommendations for cleaning, and foam dressing to sacrum every day  Goals of care -Currently remains full code.  Family remains hopeful despite low likelihood of meaningful recovery.  Palliative care consult appreciated patient remains full code.  Mobility: PT eval when able to follow command Code Status:   Code Status: Full Code  Nutritional status: Body mass index is 26.17 kg/m. Nutrition Problem: Increased nutrient needs Etiology: post-op healing Signs/Symptoms: estimated needs Diet Order            Diet NPO time specified  Diet effective midnight                 DVT prophylaxis: heparin injection 5,000 Units Start: 04/04/20 2200 SCDs Start: 03/20/20 1640   Antimicrobials:  IV Rocephin Fluid: tube feeding at 65 ml/h. Consultants: ID, neurosurgery, neurology Family Communication:  Daughter at bedside  Status is: Inpatient  Remains inpatient appropriate because: SNF not available yet  Dispo: The patient is from: Home              Anticipated d/c is to: SNF              Anticipated d/c date is: Whenever bed is available              Patient currently is medically stable to d/c.   Infusions:  . cefTRIAXone (ROCEPHIN)  IV 2 g (04/23/20 1013)  . dextrose 5% lactated ringers 30 mL/hr at 04/22/20 1530  . feeding supplement (JEVITY 1.5 CAL/FIBER) 1,000 mL (04/22/20 1530)     Scheduled Meds: . chlorhexidine gluconate (MEDLINE KIT)  15 mL Mouth Rinse BID  . Chlorhexidine Gluconate Cloth  6 each Topical Daily  . collagenase   Topical Daily  . docusate  100 mg Per Tube BID  . feeding supplement (PROSource TF)  45 mL Per Tube TID  . folic acid  1 mg Per Tube Daily  . heparin injection (subcutaneous)  5,000 Units Subcutaneous Q8H  . insulin aspart  0-20 Units Subcutaneous Q4H  . insulin aspart  3 Units Subcutaneous Q4H  . insulin glargine  20 Units Subcutaneous Daily  . levETIRAcetam  500 mg Per Tube BID  . mouth rinse  15 mL Mouth Rinse 10 times per day  . nutrition supplement (JUVEN)  1 packet Per Tube BID BM  . pantoprazole sodium  40 mg Per Tube QHS  . sodium chloride flush  10-40 mL Intracatheter Q12H  . sorbitol, milk of mag, mineral oil, glycerin (SMOG) enema  960 mL Rectal Once    Antimicrobials: Anti-infectives (From admission, onward)   Start     Dose/Rate Route Frequency Ordered Stop   04/14/20 1715  fluconazole (DIFLUCAN) tablet 150 mg        150 mg Per Tube  Once 04/14/20 1614 04/14/20 1813   04/04/20 1000  ceFAZolin (ANCEF) IVPB 2g/100 mL premix        2 g 200 mL/hr  over 30 Minutes Intravenous To Radiology 04/04/20 0935 04/04/20 1053   04/03/20 0600  ceFAZolin (ANCEF) IVPB 1 g/50 mL premix        1 g 100 mL/hr over 30 Minutes Intravenous To Radiology 04/01/20 1239 04/04/20 0600   03/24/20 1030  vancomycin (VANCOREADY) IVPB 1500 mg/300 mL  Status:  Discontinued        1,500 mg 150 mL/hr over 120 Minutes Intravenous Every 8 hours 03/24/20 0952 03/24/20 1336   03/23/20 0130  vancomycin (VANCOCIN) IVPB 1000 mg/200 mL premix  Status:  Discontinued        1,000 mg 200 mL/hr over 60 Minutes Intravenous Every 8 hours 03/22/20 1647 03/24/20 0952   03/22/20 2200  cefTRIAXone (ROCEPHIN) 2 g in sodium chloride 0.9 % 100 mL IVPB        2 g 200 mL/hr over 30 Minutes Intravenous Every 12 hours 03/22/20 1617     03/22/20 1730  vancomycin (VANCOREADY)  IVPB 1750 mg/350 mL        1,750 mg 175 mL/hr over 120 Minutes Intravenous  Once 03/22/20 1647 03/22/20 2030   03/22/20 1630  metroNIDAZOLE (FLAGYL) IVPB 500 mg  Status:  Discontinued        500 mg 100 mL/hr over 60 Minutes Intravenous Every 8 hours 03/22/20 1618 03/24/20 1350   03/22/20 1336  bacitracin 50,000 Units in sodium chloride 0.9 % 500 mL irrigation  Status:  Discontinued          As needed 03/22/20 1336 03/22/20 1341   03/22/20 1315  vancomycin (VANCOCIN) IVPB 1000 mg/200 mL premix  Status:  Discontinued        1,000 mg 200 mL/hr over 60 Minutes Intravenous To Surgery 03/22/20 1313 03/22/20 1545   03/22/20 1315  ceFEPIme (MAXIPIME) 2 g in sodium chloride 0.9 % 100 mL IVPB        2 g 200 mL/hr over 30 Minutes Intravenous To Surgery 03/22/20 1313 03/22/20 1354   03/22/20 1145  ceFAZolin (ANCEF) IVPB 2g/100 mL premix  Status:  Discontinued        2 g 200 mL/hr over 30 Minutes Intravenous  Once 03/22/20 1131 03/22/20 1545   03/22/20 1138  ceFAZolin (ANCEF) 2-4 GM/100ML-% IVPB       Note to Pharmacy: Gleason, Ginger   : cabinet override      03/22/20 1138 03/22/20 2344      PRN meds: acetaminophen (TYLENOL) oral liquid 160 mg/5 mL, acetaminophen (TYLENOL) oral liquid 160 mg/5 mL **OR** acetaminophen, hydrALAZINE, labetalol, ondansetron (ZOFRAN) IV, polyethylene glycol, promethazine, sodium chloride flush   Objective: Vitals:   04/23/20 0853 04/23/20 0913  BP: 127/80   Pulse: 82 86  Resp: (!) 25 (!) 21  Temp: 98.5 F (36.9 C)   SpO2: 96% 97%    Intake/Output Summary (Last 24 hours) at 04/23/2020 1132 Last data filed at 04/23/2020 0856 Gross per 24 hour  Intake 260 ml  Output 2150 ml  Net -1890 ml   Filed Weights   04/15/20 0156 04/17/20 0034 04/17/20 0208  Weight: 72.2 kg 75.8 kg 75.8 kg   Weight change:  Body mass index is 26.17 kg/m.   Physical Exam: General exam: Poor neurological status. Not in physical distress.  Skin: No rashes, lesions or  ulcers. HEENT: Atraumatic, normocephalic,no obvious bleeding.  Trach collar anteriorly Lungs: Clear to auscultation bilaterally CVS: Regular rate and rhythm, no murmur GI/Abd soft, nontender, nondistended, bowel sound present CNS: Poor neurological status.  Spontaneous movement of left upper extremity  seen.  Able to open eyes on touch.  Unable to follow any other commands or have a conversation. Psychiatry: Unable to examine Extremities: No pedal edema, no calf tenderness  Data Review: I have personally reviewed the laboratory data and studies available.  Recent Labs  Lab 04/19/20 0200  WBC 2.9*  NEUTROABS 1.0*  HGB 10.6*  HCT 32.8*  MCV 92.9  PLT 303   Recent Labs  Lab 04/19/20 0200  NA 139  K 3.6  CL 104  CO2 26  GLUCOSE 100*  BUN 11  CREATININE 0.33*  CALCIUM 8.0*  MG 1.9  PHOS 3.5   Signed, Terrilee Croak, MD [facility] 04/23/2020

## 2020-04-23 NOTE — Progress Notes (Signed)
Hydrotherapy Treatment Note    04/22/20 1100  Subjective Assessment  Subjective Pt Purewick not working properly and entire bed saturated in urine  Patient and Family Stated Goals None stated  Date of Onset  (Unknown)  Evaluation and Treatment  Evaluation and Treatment Procedures Explained to Patient/Family Yes  Evaluation and Treatment Procedures Patient unable to consent due to mental status  Wound / Incision (Open or Dehisced) 04/09/20 Other (Comment) Coccyx Unstageable when assessed on 04/16/20  Date First Assessed/Time First Assessed: 04/09/20 2000   Wound Type: (c) Other (Comment)  Location: Coccyx  Wound Description (Comments): Unstageable when assessed on 04/16/20  Present on Admission: No  Dressing Type ABD;Barrier Film (skin prep);Gauze (Comment);Normal saline moist dressing;Tape dressing (Santyl to necrotic tissue)  Dressing Changed Changed  Dressing Status Clean;Dry;Intact  Dressing Change Frequency Daily  Site / Wound Assessment Pink;Yellow;Black  % Wound base Red or Granulating 30%  % Wound base Yellow/Fibrinous Exudate 60%  % Wound base Black/Eschar 10%  Peri-wound Assessment Maceration;Pink;Denuded;Bleeding  Margins Unattached edges (unapproximated)  Closure None  Drainage Amount Moderate  Drainage Description Serosanguineous  Treatment Debridement (Selective);Hydrotherapy (Pulse lavage);Packing (Saline gauze) (Santyl to necrotic tissue)  Hydrotherapy  Pulsed lavage therapy - wound location Sacrum  Pulsed Lavage with Suction (psi) 12 psi  Pulsed Lavage with Suction - Normal Saline Used 1000 mL  Pulsed Lavage Tip Tip with splash shield  Selective Debridement  Selective Debridement - Location Sacrum  Selective Debridement - Tools Used Forceps;Scalpel  Selective Debridement - Tissue Removed Black necrotic tissue and eschar  Wound Therapy - Assess/Plan/Recommendations  Wound Therapy - Clinical Statement Wound progressing with selective debridement. Will follow for  selective removal of unviable tissue, to decrease bioburden, and promote wound bed healing.   Wound Therapy - Functional Problem List Decreased overall mobility - unresponsive on vent.   Factors Delaying/Impairing Wound Healing Immobility;Multiple medical problems  Hydrotherapy Plan Debridement;Dressing change;Patient/family education;Pulsatile lavage with suction  Wound Therapy - Frequency 6X / week  Wound Therapy - Follow Up Recommendations Skilled nursing facility  Wound Plan See above  Wound Therapy Goals - Improve the function of patient's integumentary system by progressing the wound(s) through the phases of wound healing by:  Decrease Necrotic Tissue to 20%  Decrease Necrotic Tissue - Progress Progressing toward goal  Increase Granulation Tissue to 80%  Increase Granulation Tissue - Progress Progressing toward goal  Goals/treatment plan/discharge plan were made with and agreed upon by patient/family No, Patient unable to participate in goals/treatment/discharge plan and family unavailable  Time For Goal Achievement 7 days  Wound Therapy - Potential for Goals Fair   Vickie Alvarado B. Vickie Alvarado PT, DPT Acute Rehabilitation Services Pager [phone] Office [phone]

## 2020-04-23 NOTE — Progress Notes (Addendum)
Nutrition Follow-up  DOCUMENTATION CODES:   Not applicable  INTERVENTION:  Continue Tube feeding: -Jevity 1.5 @ 55 ml/hr (1320 ml) via PEG -ProSource TF 45 ml TID -Juven BID  Tube feeding regimen provides 2100 kcal, 117 grams of protein, and 1003 ml of H2O.    NUTRITION DIAGNOSIS:   Increased nutrient needs related to post-op healing as evidenced by estimated needs.  Ongoing  GOAL:   Patient will meet greater than or equal to 90% of their needs  Met with TF  MONITOR:   TF tolerance, I & O's  REASON FOR ASSESSMENT:   Ventilator, Consult Enteral/tube feeding initiation and management  ASSESSMENT:   Patient with PMH significant for HTN, HLD, COVID 19 infection (03/01/20), and dental infections. Presents this admission with R sided hemiplegia and R sided facial droop. 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  8/27 - pt with abscess in L basal ganglia s/p L frontal biopsy/aspiration 9/06 - trach placed 9/09 - s/p PEG  Pt is unable to follow commands.   Pt is medically stable to d/c per MD; pending d/c to SNF once bed is available.   No updated wt since last RD assessment.   Current TF via PEG: -Jevity 1.5 @ 55 ml/hr (1320 ml) -ProSource TF 45 ml TID -Juven BID  Provides 2100 kcal, 117 grams of protein, and 1003 ml of H2O.   Labs: CBGs 99-118 Medications: colace, folvite, novolog, lantus, protonix IVF: D5 in LR @ 54m/hr  Diet Order:   Diet Order            Diet NPO time specified  Diet effective midnight                 EDUCATION NEEDS:   Not appropriate for education at this time  Skin:  Skin Assessment: Skin Integrity Issues: Skin Integrity Issues:: Unstageable, Incisions Unstageable: coccyx Incisions: L head  Last BM:  9/27 type 7  Height:   Ht Readings from Last 1 Encounters:  04/06/20 5' 7"  (1.702 m)    Weight:   Wt Readings from Last 1 Encounters:  04/17/20 75.8 kg   BMI:  Body mass index is  26.17 kg/m.  Estimated Nutritional Needs:   Kcal:  2000-2200 kcal  Protein:  95-120 grams  Fluid:  >/= 2 L/day    ALarkin Ina MS, RD, LDN RD pager number and weekend/on-call pager number located in [facility]

## 2020-04-23 NOTE — TOC Progression Note (Signed)
Transition of Care [facility]) - Progression Note    Patient Details  Name: Vickie Alvarado MRN: [id number] Date of Birth: [birth year]  Transition of Care [facility]) CM/SW Contact  Beckie Busing, RN Phone Number: [phone]  04/23/2020, 11:01 AM  Clinical Narrative:   CM called daughter Lanier Ensign to follow up on bed offers. Patient has received bed offer from [facility] in [place] , [facility] and [place]. Daughter is open to placement in [facility]. CM has reached out to [facility] the admissions coordinator at [facility] in [place] to determine if this facility can take a trach patient. Message has been left and CM will await return call.    Expected Discharge Plan: Skilled Nursing Facility Barriers to Discharge: Continued Medical Work up  Expected Discharge Plan and Services Expected Discharge Plan: Skilled Nursing Facility In-house Referral: NA Discharge Planning Services: CM Consult   Living arrangements for the past 2 months: Single Family Home                 DME Arranged: N/A DME Agency: NA       HH Arranged: NA HH Agency: NA         Social Determinants of Health (SDOH) Interventions    Readmission Risk Interventions No flowsheet data found.

## 2020-04-23 NOTE — Progress Notes (Signed)
Patient ID: Vickie Alvarado, female   DOB: Aug 10, 1965, 54 y.o.   MRN: [id number]  This NP visited patient at the bedside as a follow up for palliative medicine needs and emotional support.    Discussed case with treatment team, reviewed medical records and daily labs.  Patient is somewhat more responsive today, unfortunately she has poor neurologic recovery.  She is high risk for decompensation.  Today is day 34 of this hospitalization.  Spoke to daughter Vickie Alvarado by telephone.  Education offered regarding the importance of regularly  re-visting GOCs depending on the patient's condition.  Created space and opportunity for daughter to explore their thoughts and feelings regarding current medical situation.  Family is very optimistic for improvement and recovery.  She speaks to her  frustration with limited transiton of care options, "I just want her to get good care" , regarding bed offers at SNFs.  Patient is stable for discharge, once bed is available.    They are open to all offered and available medical interventions to prolong life.  " we are a strong family"  She  express appreciation for the hospital care and PMT support  Raised awareness to and education offered regarding  the importance of continued conversation with each other  and the  medical providers regarding overall plan of care and treatment options,  ensuring decisions are within the context of the patients values and GOCs.  Questions and concerns addressed   Discussed with Dr Pola Corn  Total time spent on the unit was 25 minutes  Greater than 50% of the time was spent in counseling and coordination of care  Lorinda Creed NP  Palliative Medicine Team Team Phone # [phone] Pager [phone]

## 2020-04-23 NOTE — Progress Notes (Signed)
Physical Therapy Wound Treatment Patient Details  Name: Vickie Alvarado MRN: [id number] Date of Birth: 10-06-1965  Today's Date: 04/23/2020 Time: 8416-6063 Time Calculation (min): 35 min  Subjective  Subjective: Pt more responsive today able to squeeze therapist hand on command. Recommended to RN PT therapy Eval  Patient and Family Stated Goals: None stated Date of Onset:  (Unknown)  Pain Score:  Pt more awake with 6/10 facial pain score during session. Premedicated with tylenol  Wound Assessment  Wound / Incision (Open or Dehisced) 04/09/20 Other (Comment) Coccyx Unstageable when assessed on 04/16/20 (Active)  Wound Image    04/17/20 1100  Dressing Type ABD;Barrier Film (skin prep);Normal saline moist dressing;Tape dressing 04/23/20 1100  Dressing Changed Changed 04/23/20 1100  Dressing Status Clean;Dry;Intact 04/23/20 1100  Dressing Change Frequency Daily 04/23/20 1100  Site / Wound Assessment Red;Pink;Yellow;Brown 04/23/20 1100  % Wound base Red or Granulating 30% 04/23/20 1100  % Wound base Yellow/Fibrinous Exudate 60% 04/23/20 1100  % Wound base Black/Eschar 10% 04/23/20 1100  % Wound base Other/Granulation Tissue (Comment) 0% 04/23/20 1100  Peri-wound Assessment Maceration;Pink;Denuded;Erythema (blanchable);Induration 04/23/20 1100  Wound Length (cm) 7.5 cm 04/17/20 0900  Wound Width (cm) 9 cm 04/17/20 0900  Wound Depth (cm) 0.2 cm 04/17/20 0900  Wound Volume (cm^3) 13.5 cm^3 04/17/20 0900  Wound Surface Area (cm^2) 67.5 cm^2 04/17/20 0900  Margins Unattached edges (unapproximated) 04/23/20 1100  Closure None 04/23/20 1100  Drainage Amount Moderate 04/23/20 1100  Drainage Description Serosanguineous 04/23/20 1100  Treatment Debridement (Selective);Hydrotherapy (Pulse lavage);Packing (Saline gauze) 04/23/20 1100   Santyl applied to necrotic tissue   Hydrotherapy Pulsed lavage therapy - wound location: Sacrum Pulsed Lavage with Suction (psi): 12 psi Pulsed Lavage with  Suction - Normal Saline Used: 1000 mL Pulsed Lavage Tip: Tip with splash shield Selective Debridement Selective Debridement - Location: Sacrum Selective Debridement - Tools Used: Forceps;Scalpel Selective Debridement - Tissue Removed: yellow and brown necrotic tissue   Wound Assessment and Plan  Wound Therapy - Assess/Plan/Recommendations Wound Therapy - Clinical Statement: Pt with increased awareness today, Requested RN premedicate. Pt with wincing and attempt to move away with pulse lavage. May require stronger pain medication in future. Wound continues to improve as eschar and slough removed, however likely will extend all the way to sacral bone. Pt will continue to benefit from pulse lavage and selective debridement to decrease bioburden and improve wound healing.  Wound Therapy - Functional Problem List: Decreased overall mobility  Factors Delaying/Impairing Wound Healing: Immobility;Multiple medical problems Hydrotherapy Plan: Debridement;Dressing change;Patient/family education;Pulsatile lavage with suction Wound Therapy - Frequency: 6X / week Wound Therapy - Follow Up Recommendations: Skilled nursing facility Wound Plan: See above  Wound Therapy Goals- Improve the function of patient's integumentary system by progressing the wound(s) through the phases of wound healing (inflammation - proliferation - remodeling) by: Decrease Necrotic Tissue to: 20% Decrease Necrotic Tissue - Progress: Progressing toward goal Increase Granulation Tissue to: 80% Increase Granulation Tissue - Progress: Progressing toward goal Goals/treatment plan/discharge plan were made with and agreed upon by patient/family: No, Patient unable to participate in goals/treatment/discharge plan and family unavailable Time For Goal Achievement: 7 days Wound Therapy - Potential for Goals: Fair  Goals will be updated until maximal potential achieved or discharge criteria met.  Discharge criteria: when goals achieved,  discharge from hospital, MD decision/surgical intervention, no progress towards goals, refusal/missing three consecutive treatments without notification or medical reason.  GP    Dani Gobble. Migdalia Dk PT, DPT Acute Rehabilitation Services Pager [phone]  Office [phone]  [place] 04/23/2020, 11:32 AM

## 2020-04-24 DIAGNOSIS — G060 Intracranial abscess and granuloma: Secondary | ICD-10-CM | POA: Diagnosis not present

## 2020-04-24 LAB — CBC WITH DIFFERENTIAL/PLATELET
Abs Immature Granulocytes: 0.1 10*3/uL — ABNORMAL HIGH (ref 0.00–0.07)
Basophils Absolute: 0 10*3/uL (ref 0.0–0.1)
Basophils Relative: 0 %
Eosinophils Absolute: 0 10*3/uL (ref 0.0–0.5)
Eosinophils Relative: 1 %
HCT: 34.4 % — ABNORMAL LOW (ref 36.0–46.0)
Hemoglobin: 10.9 g/dL — ABNORMAL LOW (ref 12.0–15.0)
Immature Granulocytes: 1 %
Lymphocytes Relative: 25 %
Lymphs Abs: 2.1 10*3/uL (ref 0.7–4.0)
MCH: 29.1 pg (ref 26.0–34.0)
MCHC: 31.7 g/dL (ref 30.0–36.0)
MCV: 91.7 fL (ref 80.0–100.0)
Monocytes Absolute: 1 10*3/uL (ref 0.1–1.0)
Monocytes Relative: 12 %
Neutro Abs: 5.1 10*3/uL (ref 1.7–7.7)
Neutrophils Relative %: 61 %
Platelets: 394 10*3/uL (ref 150–400)
RBC: 3.75 MIL/uL — ABNORMAL LOW (ref 3.87–5.11)
RDW: 15.9 % — ABNORMAL HIGH (ref 11.5–15.5)
WBC: 8.3 10*3/uL (ref 4.0–10.5)
nRBC: 0 % (ref 0.0–0.2)

## 2020-04-24 LAB — BASIC METABOLIC PANEL
Anion gap: 9 (ref 5–15)
BUN: 11 mg/dL (ref 6–20)
CO2: 26 mmol/L (ref 22–32)
Calcium: 8.5 mg/dL — ABNORMAL LOW (ref 8.9–10.3)
Chloride: 104 mmol/L (ref 98–111)
Creatinine, Ser: 0.39 mg/dL — ABNORMAL LOW (ref 0.44–1.00)
GFR calc Af Amer: >60 mL/min (ref >60)
GFR calc non Af Amer: >60 mL/min (ref >60)
Glucose, Bld: 108 mg/dL — ABNORMAL HIGH (ref 70–99)
Potassium: 3.5 mmol/L (ref 3.5–5.1)
Sodium: 139 mmol/L (ref 135–145)

## 2020-04-24 LAB — GLUCOSE, CAPILLARY
Glucose-Capillary: 102 mg/dL — ABNORMAL HIGH (ref 70–99)
Glucose-Capillary: 100 mg/dL — ABNORMAL HIGH (ref 70–99)
Glucose-Capillary: 135 mg/dL — ABNORMAL HIGH (ref 70–99)
Glucose-Capillary: 96 mg/dL (ref 70–99)
Glucose-Capillary: 116 mg/dL — ABNORMAL HIGH (ref 70–99)
Glucose-Capillary: 112 mg/dL — ABNORMAL HIGH (ref 70–99)

## 2020-04-24 MED ORDER — OXYCODONE-ACETAMINOPHEN 5-325 MG PO TABS
1.0000 | ORAL_TABLET | Freq: Four times a day (QID) | ORAL | Status: DC | PRN
  Administered 2020-04-25 – 2020-05-01 (×8): 1
  Filled 2020-04-24 (×8): qty 1

## 2020-04-24 NOTE — TOC Progression Note (Signed)
Transition of Care [facility]) - Progression Note    Patient Details  Name: Matilde Pottenger MRN: [id number] Date of Birth: 12/07/1965  Transition of Care [facility]) CM/SW Contact  Beckie Busing, RN Phone Number: [phone]  04/24/2020, 9:39 AM  Clinical Narrative:    CM spoke with Lorrie at [facility] in [facility]. Lorrie confirmed that the facility does accept trach patients. Facility can offer patient a bed. CM called daughter Lanier Ensign) to confirm acceptance of bed offer. Lanier Ensign states that she has read the reviews about the facility and would like to tour facility or have her sister to tour facility. Daughter is upset and crying stating that she "doesn't want the hospital to just push her mother out just anywhere." Daughter is not agreeable to accepting bed at [facility] until family is able to tour or get more information about the facility. Lorrie from [facility] has been updated and the MD has been updated. CM will continue to follow for placement.    Expected Discharge Plan: Skilled Nursing Facility Barriers to Discharge: Continued Medical Work up  Expected Discharge Plan and Services Expected Discharge Plan: Skilled Nursing Facility In-house Referral: NA Discharge Planning Services: CM Consult   Living arrangements for the past 2 months: Single Family Home                 DME Arranged: N/A DME Agency: NA       HH Arranged: NA HH Agency: NA         Social Determinants of Health (SDOH) Interventions    Readmission Risk Interventions No flowsheet data found.

## 2020-04-24 NOTE — Progress Notes (Signed)
Physical Therapy Wound Treatment Patient Details  Name: Vickie Alvarado MRN: [id number] Date of Birth: 1966-06-27  Today's Date: 04/24/2020 Time: 0920-0957 Time Calculation (min): 37 min  Subjective  Subjective: Continues to be more responsive, asked stronger pain medication be ordered Patient and Family Stated Goals: None stated Date of Onset:  (unknown)  Pain Score:  Pt is much more alert today and exhibits 8/10 facial pain with treatment, with increase in HR and RR. MD notified and stronger pain medication will be available for treatment tomorrow.   Wound Assessment  Wound / Incision (Open or Dehisced) 04/09/20 Other (Comment) Coccyx Unstageable when assessed on 04/16/20 (Active)  Wound Image   04/24/20 1100  Dressing Type ABD;Barrier Film (skin prep);Normal saline moist dressing;Tape dressing 04/24/20 1100  Dressing Changed Changed 04/24/20 1100  Dressing Status Clean;Dry;Intact 04/24/20 1100  Dressing Change Frequency Twice a day 04/24/20 1100  Site / Wound Assessment Bleeding;Granulation tissue;Yellow;Painful;Pale;Brown;Red;Pink 04/24/20 1100  % Wound base Red or Granulating 45% 04/24/20 1100  % Wound base Yellow/Fibrinous Exudate 50% 04/24/20 1100  % Wound base Black/Eschar 5% 04/24/20 1100  % Wound base Other/Granulation Tissue (Comment) 0% 04/24/20 1100  Peri-wound Assessment Maceration;Pink;Denuded;Erythema (blanchable);Induration 04/24/20 1100  Wound Length (cm) 8.4 cm 04/24/20 1100  Wound Width (cm) 9.1 cm 04/24/20 1100  Wound Depth (cm) 2.6 cm 04/24/20 1100  Wound Volume (cm^3) 198.74 cm^3 04/24/20 1100  Wound Surface Area (cm^2) 76.44 cm^2 04/24/20 1100  Margins Unattached edges (unapproximated) 04/24/20 1100  Closure Adhesive strips 04/23/20 2000  Drainage Amount Moderate 04/24/20 1100  Drainage Description Serosanguineous 04/24/20 1100  Treatment Debridement (Selective);Hydrotherapy (Pulse lavage);Packing (Saline gauze) 04/24/20 1100       Wound Assessment and  Plan     Wound Therapy Goals- Improve the function of patient's integumentary system by progressing the wound(s) through the phases of wound healing (inflammation - proliferation - remodeling) by:    Goals will be updated until maximal potential achieved or discharge criteria met.  Discharge criteria: when goals achieved, discharge from hospital, MD decision/surgical intervention, no progress towards goals, refusal/missing three consecutive treatments without notification or medical reason.  GP    Dani Gobble. Migdalia Dk PT, DPT Acute Rehabilitation Services Pager [phone] Office [phone]  [place] 04/24/2020, 11:14 AM

## 2020-04-24 NOTE — Progress Notes (Addendum)
PROGRESS NOTE  Vickie Alvarado  DOB: 28-Dec-1965  PCP: [facility] ZOX:[id number]  DOA: 03/20/2020  LOS: 35 days   Chief complaint: Right-sided weakness, dysarthria, right facial droop  Brief narrative: Vickie Alvarado is a 54 y.o. year old female with medical history significant for HTN, HLD, COVID-19 infection (diagnosed 03/01/2020), dental infection and sinus infections.  Timeline of events:  8/25, patient presented with right-sided weakness, dysarthria and right facial droop also to have abscess in the left basal ganglia.  Patient was admitted to the hospital.  8/27, underwent stereotactic biopsy and aspiration of the abscess by neurosurgery and was transferred to ICU.  Wound culture resulted Streptococcus intermedius  8/27, MRI brain was obtained for worsening mental status.  It showed new large left PCA infarct along with scattered infarcts in the right basal ganglia and right thalamus concerning for either CVA related to septic emboli versus herniation syndrome. Neurosurgery believed patient developed the new development of the posterior cerebral artery distribution infarct due to to sequelae from herniation. Given her large hemisphere brain abscess with surrounding edema/cerebritis neurosurgery did not recommend decompressive surgery.  Patient was placed on a tapering course of Decadron which she completed by now.  8/28, PICC line placed  9/6, tracheostomy  9/9, PEG placement and tube feeding started  9/13, repeat CT head showed improvement in fluid collections blood showed persistent left parietal/occipital acute/subacute infarcts with improvement in midline shift.  Subjective: Patient was seen and examined this morning. Lying down in bed.  Eyes open.  Spontaneous movement of left upper extremity seen. She is having low-grade temperature up to 100.6 today. Labs unremarkable.  Assessment/Plan: Acute toxic metabolic encephalopathy -Patient has poor  neurological status.  She has moments of eye tracking and spontaneous movement of left extremities.  However, she has a very poor overall neurological recovery. -Multifactorial: Brain abscess, brain tissue herniation, vasogenic edema, CVA -Unlikely to have meaningful recovery.  However patient's family remains hopeful.  PT/OT/palliative care on board. 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 -On IV Rocephin for 8 weeks course as directed by ID to complete on 05/17/2020. -Completed a course of Decadron. -Continue Keppra 500 mg twice daily for seizure prophylaxis. - patient is having persistent low-grade temperature today.  Probably caused by aggressive wound care.  -Continue to monitor on IV Rocephin.  Repeat blood work Architectural technologist.  If fever persists, may need further investigation.  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  Acute hypoxic respiratory failure -status post trach 04/01/2020 -stable 4-5 L of O2 on trach collar.  -PCCM following.  Trach tube downsized to 6 -0 cuffless.  Continue 4-5 L oxygen by trach collar.  Diarrhea -Secondary to schedule laxatives.  Change to as needed.  Nutrition -Continue Prosource via feeding tube  GERD, stable -continue PPI per tube  Poorly controlled diabetes mellitus  -A1c 11.5  -Blood sugar currently controlled on  Lantus 20 units daily and NovoLog 3 units every 4 hours while on tube feeds. -Continue Accu-Cheks. Lab Results  Component Value Date   HGBA1C 11.5 (H) 03/22/2020   Recent Labs  Lab 04/23/20 1949 04/23/20 2319 04/24/20 0342 04/24/20 0712 04/24/20 1138  GLUCAP 113* 107* 102* 100* 135*   Anemia of chronic disease -Hemoglobin stable.  Vitamin B12,  folate and  iron level adequate. Recent Labs    03/27/20 0443 03/28/20 0409 03/29/20 0515 03/31/20 1016 04/01/20 0620 04/04/20 1446 04/07/20 0435 04/13/20 0500 04/19/20 0200 04/24/20 0432  HGB 9.8* 10.6* 11.2* 11.6* 11.2* 11.6* 10.0* 10.6* 10.6* 10.9*   History of bipolar disorder, stable -Seroquel on hold.  Currently avoiding any sedative medication.  Pressure ulcer in gluteal cleft/sacral area,   -consistent with stage 2 pressure ulcer.  Wound care following.  Patient is getting hydrotherapy, debridement packing.  Goals of care -Currently remains full code.  Family remains hopeful despite low likelihood of meaningful recovery.  Palliative care consult appreciated patient remains full code.  Mobility: PT eval when able to follow command Code Status:   Code Status: Full Code  Nutritional status: Body mass index is 0.01 kg/m. Nutrition Problem: Increased nutrient needs Etiology: post-op healing Signs/Symptoms: estimated needs Diet Order            Diet NPO time specified  Diet effective midnight                 DVT prophylaxis: heparin injection 5,000 Units Start: 04/04/20 2200 SCDs Start: 03/20/20 1640   Antimicrobials:  IV Rocephin Fluid: tube feeding at 65 ml/h. Consultants: ID, neurosurgery, neurology Family Communication:  Daughter at bedside  Status is: Inpatient  Remains inpatient appropriate because: SNF not available yet  Dispo: The patient is from: Home              Anticipated d/c is to: SNF              Anticipated d/c date is: 1 to 2 days              Patient currently is not medically stable to d/c.   Infusions:  . cefTRIAXone (ROCEPHIN)  IV 2 g (04/24/20 1000)  . dextrose 5% lactated ringers 30 mL/hr at 04/22/20 1530  . feeding supplement (JEVITY 1.5 CAL/FIBER) 1,000 mL (04/24/20 0551)    Scheduled Meds: . chlorhexidine gluconate (MEDLINE KIT)  15 mL Mouth Rinse BID  . Chlorhexidine Gluconate Cloth  6 each Topical Daily  . collagenase    Topical Daily  . docusate  100 mg Per Tube BID  . feeding supplement (PROSource TF)  45 mL Per Tube TID  . folic acid  1 mg Per Tube Daily  . heparin injection (subcutaneous)  5,000 Units Subcutaneous Q8H  . insulin aspart  0-20 Units Subcutaneous Q4H  . insulin aspart  3 Units Subcutaneous Q4H  . insulin glargine  20 Units Subcutaneous Daily  . levETIRAcetam  500 mg Per Tube BID  . mouth rinse  15 mL Mouth Rinse 10 times per day  . nutrition supplement (JUVEN)  1 packet Per Tube BID BM  . pantoprazole sodium  40 mg Per Tube QHS  . sodium chloride flush  10-40 mL Intracatheter Q12H  . sorbitol, milk of mag, mineral oil, glycerin (SMOG) enema  960 mL Rectal Once    Antimicrobials: Anti-infectives (From admission, onward)   Start     Dose/Rate Route Frequency Ordered Stop   04/14/20 1715  fluconazole (DIFLUCAN) tablet 150 mg        150 mg Per Tube  Once 04/14/20 1614 04/14/20 1813   04/04/20 1000  ceFAZolin (ANCEF) IVPB 2g/100 mL premix        2 g 200 mL/hr over 30 Minutes Intravenous To Radiology 04/04/20 0935 04/04/20 1053   04/03/20 0600  ceFAZolin (ANCEF) IVPB 1 g/50 mL premix  1 g 100 mL/hr over 30 Minutes Intravenous To Radiology 04/01/20 1239 04/04/20 0600   03/24/20 1030  vancomycin (VANCOREADY) IVPB 1500 mg/300 mL  Status:  Discontinued        1,500 mg 150 mL/hr over 120 Minutes Intravenous Every 8 hours 03/24/20 0952 03/24/20 1336   03/23/20 0130  vancomycin (VANCOCIN) IVPB 1000 mg/200 mL premix  Status:  Discontinued        1,000 mg 200 mL/hr over 60 Minutes Intravenous Every 8 hours 03/22/20 1647 03/24/20 0952   03/22/20 2200  cefTRIAXone (ROCEPHIN) 2 g in sodium chloride 0.9 % 100 mL IVPB        2 g 200 mL/hr over 30 Minutes Intravenous Every 12 hours 03/22/20 1617     03/22/20 1730  vancomycin (VANCOREADY) IVPB 1750 mg/350 mL        1,750 mg 175 mL/hr over 120 Minutes Intravenous  Once 03/22/20 1647 03/22/20 2030   03/22/20 1630  metroNIDAZOLE (FLAGYL) IVPB  500 mg  Status:  Discontinued        500 mg 100 mL/hr over 60 Minutes Intravenous Every 8 hours 03/22/20 1618 03/24/20 1350   03/22/20 1336  bacitracin 50,000 Units in sodium chloride 0.9 % 500 mL irrigation  Status:  Discontinued          As needed 03/22/20 1336 03/22/20 1341   03/22/20 1315  vancomycin (VANCOCIN) IVPB 1000 mg/200 mL premix  Status:  Discontinued        1,000 mg 200 mL/hr over 60 Minutes Intravenous To Surgery 03/22/20 1313 03/22/20 1545   03/22/20 1315  ceFEPIme (MAXIPIME) 2 g in sodium chloride 0.9 % 100 mL IVPB        2 g 200 mL/hr over 30 Minutes Intravenous To Surgery 03/22/20 1313 03/22/20 1354   03/22/20 1145  ceFAZolin (ANCEF) IVPB 2g/100 mL premix  Status:  Discontinued        2 g 200 mL/hr over 30 Minutes Intravenous  Once 03/22/20 1131 03/22/20 1545   03/22/20 1138  ceFAZolin (ANCEF) 2-4 GM/100ML-% IVPB       Note to Pharmacy: Gleason, Ginger   : cabinet override      03/22/20 1138 03/22/20 2344      PRN meds: acetaminophen (TYLENOL) oral liquid 160 mg/5 mL, acetaminophen (TYLENOL) oral liquid 160 mg/5 mL **OR** acetaminophen, hydrALAZINE, labetalol, ondansetron (ZOFRAN) IV, oxyCODONE-acetaminophen, polyethylene glycol, promethazine, sodium chloride flush   Objective: Vitals:   04/24/20 1128 04/24/20 1139  BP: (!) 149/80 127/70  Pulse: 96 91  Resp: (!) 22 (!) 21  Temp:  100.2 F (37.9 C)  SpO2: 99% 94%    Intake/Output Summary (Last 24 hours) at 04/24/2020 1301 Last data filed at 04/24/2020 0339 Gross per 24 hour  Intake 220 ml  Output 1820 ml  Net -1600 ml   Filed Weights   04/17/20 0208 04/23/20 2348 04/24/20 0130  Weight: 75.8 kg (!) 0.03 kg (!) 0.03 kg   Weight change:  Body mass index is 0.01 kg/m.   Physical Exam: General exam: Poor neurological status.  Eyes remain open.  Unable to follow any other command.   Skin: No rashes, lesions or ulcers. HEENT: Atraumatic, normocephalic,no obvious bleeding.  Trach collar anteriorly Lungs:  Clear to auscultation bilaterally CVS: Regular rate and rhythm, no murmur GI/Abd soft, nontender, nondistended, bowel sound present CNS: Poor neurological status.  Spontaneous movement of left upper extremity seen.  Able to open eyes on touch.  Unable to follow any other commands or have  a conversation. Psychiatry: Unable to examine Extremities: No pedal edema, no calf tenderness  Data Review: I have personally reviewed the laboratory data and studies available.  Recent Labs  Lab 04/19/20 0200 04/24/20 0432  WBC 2.9* 8.3  NEUTROABS 1.0* 5.1  HGB 10.6* 10.9*  HCT 32.8* 34.4*  MCV 92.9 91.7  PLT 303 394   Recent Labs  Lab 04/19/20 0200 04/24/20 0432  NA 139 139  K 3.6 3.5  CL 104 104  CO2 26 26  GLUCOSE 100* 108*  BUN 11 11  CREATININE 0.33* 0.39*  CALCIUM 8.0* 8.5*  MG 1.9  --   PHOS 3.5  --    Signed, Terrilee Croak, MD [facility] 04/24/2020

## 2020-04-24 NOTE — Progress Notes (Signed)
Occupational Therapy Treatment Patient Details Name: Vickie Alvarado MRN: [id number] DOB: [birth year] Today's Date: 04/24/2020    History of present illness 54 y/o F with a history of HTN, HLD, COVID-19 infection (03/01/20), dental infections, and sinus infections presented to [facility] after being found altered with R sided weakness, dysarthria, and R facial droop. Patient had decreased  P.O. intake, decreased appetite, and projectile vomiting in addition to her other symptoms. 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 Neurosurgery performed a L frontal stereotactic biopsy/aspiration of the abscess. 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 ETT 8/28, trach 9/6.   OT comments  Patient supine in bed and seen in conjunction with PT to optimize participation today.  Patient requires total assist for bed mobility for hygiene and linen change due to incontinence of bowel; total assist to wash face with [facility] assist and total assist +2 to change gown.  Patient responding well to listening to [place] Dura, following more simple commands and scanning towards R side following therapist to midline, shaking head no when asked if in pain.  Patient provided with PROM to UEs, repositioned in bed.  Will follow acutely.       Follow Up Recommendations  LTACH;SNF;Supervision/Assistance - 24 hour    Equipment Recommendations  Wheelchair (measurements OT);Hospital bed;Wheelchair cushion (measurements OT);Other (comment)    Recommendations for Other Services      Precautions / Restrictions Precautions Precautions: Fall Precaution Comments: trach; peg Required Braces or Orthoses: Other Brace (B prafo) Restrictions Weight Bearing Restrictions: No       Mobility Bed Mobility Overal bed mobility: Needs Assistance Bed Mobility: Rolling Rolling: Total assist;+2  for physical assistance         General bed mobility comments: total assist +2 to roll in bed for toileting hygiene and linen change   Transfers                 General transfer comment: deferred    Balance                                           ADL either performed or assessed with clinical judgement   ADL Overall ADL's : Needs assistance/impaired Eating/Feeding: NPO   Grooming: Wash/dry face;Bed level;Total assistance Grooming Details (indicate cue type and reason): total assist with hand over hand to wash face with R UE          Upper Body Dressing : Total assistance;Bed level Upper Body Dressing Details (indicate cue type and reason): to change gown         Toileting- Clothing Manipulation and Hygiene: Total assistance;Bed level;+2 for physical assistance;+2 for safety/equipment Toileting - Clothing Manipulation Details (indicate cue type and reason): total assist incontinent of BM, for hygiene and linen change      Functional mobility during ADLs: Total assistance;+2 for physical assistance;+2 for safety/equipment General ADL Comments: total assist for all ADLs      Vision   Additional Comments: conjugate gaze, L gaze preference; scans to midline and sustaining for approx 15 seconds to follow therapist towards music    Perception     Praxis      Cognition Arousal/Alertness: Awake/alert Behavior During Therapy: Flat affect Overall Cognitive Status: Difficult  to assess Area of Impairment: Following commands;Attention;Awareness                   Current Attention Level: Focused   Following Commands: Follows one step commands inconsistently;Follows one step commands with increased time   Awareness: Intellectual   General Comments: pt with eyes open during session; follows simple commands with increased time, able to squeeze hand with R UE, give thumbs up with L UE        Exercises Exercises: Other exercises Other  Exercises Other Exercises: Provided PROM to BUEs with fluctating tone noted.   Shoulder Instructions       General Comments pt with increased secretions today, total assist for mgmt using suction; utilized [employer] music with pt moving shoulders and UEs minimal as if "dancing"?;      Pertinent Vitals/ Pain       Pain Assessment: Faces Faces Pain Scale: No hurt Pain Intervention(s): Monitored during session  Home Living                                          Prior Functioning/Environment              Frequency  Min 2X/week        Progress Toward Goals  OT Goals(current goals can now be found in the care plan section)  Progress towards OT goals: Progressing toward goals  Acute Rehab OT Goals Patient Stated Goal: unable  Plan Discharge plan remains appropriate;Frequency remains appropriate    Co-evaluation    PT/OT/SLP Co-Evaluation/Treatment: Yes Reason for Co-Treatment: For patient/therapist safety;To address functional/ADL transfers;Necessary to address cognition/behavior during functional activity   OT goals addressed during session: ADL's and self-care      AM-PAC OT "6 Clicks" Daily Activity     Outcome Measure   Help from another person eating meals?: Total Help from another person taking care of personal grooming?: Total Help from another person toileting, which includes using toliet, bedpan, or urinal?: Total Help from another person bathing (including washing, rinsing, drying)?: Total Help from another person to put on and taking off regular upper body clothing?: Total Help from another person to put on and taking off regular lower body clothing?: Total 6 Click Score: 6    End of Session    OT Visit Diagnosis: Muscle weakness (generalized) (M62.81);Other abnormalities of gait and mobility (R26.89);Apraxia (R48.2);Ataxia, unspecified (R27.0);Feeding difficulties (R63.3);Other symptoms and signs involving cognitive  function;Other symptoms and signs involving the nervous system (R29.898)   Activity Tolerance Patient tolerated treatment well   Patient Left in bed;with call bell/phone within reach   Nurse Communication Mobility status        Time: 0109-3235 OT Time Calculation (min): 40 min  Charges: OT General Charges $OT Visit: 1 Visit OT Treatments $Self Care/Home Management : 8-22 mins $Therapeutic Activity: 8-22 mins  Barry Brunner, OT Acute Rehabilitation Services Pager [phone] Office [phone]    Chancy Milroy 04/24/2020, 3:17 PM

## 2020-04-24 NOTE — Evaluation (Signed)
Physical Therapy Re-Evaluation Patient Details Name: Vickie Alvarado MRN: [id number] DOB: 1965/10/18 Today's Date: 04/24/2020   History of Present Illness  54 y/o F with a history of HTN, HLD, COVID-19 infection (03/01/20), dental infections, and sinus infections presented to [facility] after being found altered with R sided weakness, dysarthria, and R facial droop. Patient had decreased  P.O. intake, decreased appetite, and projectile vomiting in addition to her other symptoms. 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 Neurosurgery performed a L frontal stereotactic biopsy/aspiration of the abscess. 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 ETT 8/28, trach 9/6.  Clinical Impression   PT reconsulted for pt re-evaluation. Pt continues to present with profound LE weakness, fluctuating LE tone tending towards hypertonicity during hip/knee flexion and DF, impaired cognitive status although pt responding yes/no and following simple commands now, and decreased activity tolerance vs baseline. Pt required total +2 assist for rolling and boost up in bed this day, pt soiled in stool requiring total assist for pericare. Pt with multiple strong, secretion-full coughs this day, requiring assist for superficial suction at trach site. Pt has made improvement since PT sign off 2 weeks ago, will pick up on caseload to progress mobility as tolerated.       Follow Up Recommendations SNF;LTACH    Equipment Recommendations  None recommended by PT    Recommendations for Other Services       Precautions / Restrictions Precautions Precautions: Fall Precaution Comments: trach; peg Required Braces or Orthoses: Other Brace (B prafo) Restrictions Weight Bearing Restrictions: No      Mobility  Bed Mobility Overal bed mobility: Needs Assistance Bed Mobility:  Rolling Rolling: Total assist;+2 for physical assistance         General bed mobility comments: total assist +2 to roll in bed for toileting hygiene and linen change   Transfers                 General transfer comment: deferred  Ambulation/Gait                Stairs            Wheelchair Mobility    Modified Rankin (Stroke Patients Only) Modified Rankin (Stroke Patients Only) Pre-Morbid Rankin Score: No symptoms Modified Rankin: Severe disability     Balance Overall balance assessment: Needs assistance   Sitting balance-Leahy Scale: Zero Sitting balance - Comments: Zero balance long sitting                                      Pertinent Vitals/Pain Pain Assessment: Faces Faces Pain Scale: Hurts a little bit Pain Location: generalized Pain Descriptors / Indicators: Guarding Pain Intervention(s): Limited activity within patient's tolerance;Monitored during session;Repositioned    Home Living Family/patient expects to be discharged to:: Private residence Living Arrangements: Children               Additional Comments: Unsure of home setup as pt unable to respond to questions    Prior Function           Comments: Unsure of PLOF as pt unable to respond to questions     Hand Dominance        Extremity/Trunk Assessment   Upper Extremity Assessment Upper Extremity  Assessment: Defer to OT evaluation    Lower Extremity Assessment Lower Extremity Assessment: RLE deficits/detail;LLE deficits/detail RLE Deficits / Details: PROM hip, knee WFL, Ankle DF reaches 0*. Hypotonicity noted, but resistance in hip and knee flexion during heel slides LLE Deficits / Details: PROM hip, knee WFL, Ankle DF reaches 0*. Hypotonicity noted, but resistance in hip and knee flexion during heel slides LLE Sensation: decreased light touch LLE Coordination: decreased gross motor;decreased fine motor    Cervical / Trunk Assessment Cervical  / Trunk Assessment: Other exceptions Cervical / Trunk Exceptions: Severe weakness, unable to hold head up for long sitting  Communication   Communication: Tracheostomy  Cognition Arousal/Alertness: Awake/alert Behavior During Therapy: Flat affect Overall Cognitive Status: Difficult to assess Area of Impairment: Following commands;Attention;Awareness                   Current Attention Level: Focused   Following Commands: Follows one step commands inconsistently;Follows one step commands with increased time   Awareness: Intellectual Problem Solving: Slow processing General Comments: pt with eyes open during session; follows simple commands with increased time, able to squeeze hand with R UE, give thumbs up with L UE, nods yes/no. Pt making different expressions at PT throughout; eyebrow raising, smile, eye roll      General Comments General comments (skin integrity, edema, etc.): multiple productive, strong coughs during session    Exercises General Exercises - Lower Extremity Ankle Circles/Pumps: PROM;Both;Supine;10 reps (with sustained hold until 0* DF reached) Short Arc Quad: PROM;Both;10 reps;Supine (to maintain full ROM knee extension) Heel Slides: PROM;Both;Supine;10 reps Hip ABduction/ADduction: PROM;Both;5 reps;Supine Other Exercises Other Exercises: Provided PROM to BUEs with fluctating tone noted.   Assessment/Plan    PT Assessment Patient needs continued PT services  PT Problem List Decreased strength;Decreased mobility;Impaired tone;Decreased cognition;Decreased balance;Decreased activity tolerance;Impaired sensation;Cardiopulmonary status limiting activity;Decreased coordination       PT Treatment Interventions DME instruction;Therapeutic activities;Therapeutic exercise;Patient/family education;Balance training;Functional mobility training;Neuromuscular re-education    PT Goals (Current goals can be found in the Care Plan section)  Acute Rehab PT  Goals Patient Stated Goal: unable PT Goal Formulation: Patient unable to participate in goal setting Time For Goal Achievement: 05/08/20 Potential to Achieve Goals: Fair    Frequency Min 2X/week   Barriers to discharge        Co-evaluation PT/OT/SLP Co-Evaluation/Treatment: Yes Reason for Co-Treatment: For patient/therapist safety;To address functional/ADL transfers;Necessary to address cognition/behavior during functional activity PT goals addressed during session: Mobility/safety with mobility;Strengthening/ROM OT goals addressed during session: ADL's and self-care       AM-PAC PT "6 Clicks" Mobility  Outcome Measure Help needed turning from your back to your side while in a flat bed without using bedrails?: Total Help needed moving from lying on your back to sitting on the side of a flat bed without using bedrails?: Total Help needed moving to and from a bed to a chair (including a wheelchair)?: Total Help needed standing up from a chair using your arms (e.g., wheelchair or bedside chair)?: Total Help needed to walk in hospital room?: Total Help needed climbing 3-5 steps with a railing? : Total 6 Click Score: 6    End of Session Equipment Utilized During Treatment: Oxygen Activity Tolerance: Patient limited by fatigue Patient left: in bed;with bed alarm set;with call bell/phone within reach;with SCD's reapplied;Other (comment) (with PRAFOs donned) Nurse Communication: Mobility status PT Visit Diagnosis: Muscle weakness (generalized) (M62.81);Hemiplegia and hemiparesis Hemiplegia - Right/Left: Right Hemiplegia - dominant/non-dominant: Dominant Hemiplegia - caused by: Other cerebrovascular  disease    Time: 5909-3112 PT Time Calculation (min) (ACUTE ONLY): 41 min   Charges:     PT Treatments $Therapeutic Activity: 8-22 mins       Laruth Hanger E, PT Acute Rehabilitation Services Pager [phone]  Office [phone]   Shayna Eblen D Despina Hidden 04/24/2020, 5:16 PM

## 2020-04-25 DIAGNOSIS — G060 Intracranial abscess and granuloma: Secondary | ICD-10-CM | POA: Diagnosis not present

## 2020-04-25 LAB — GLUCOSE, CAPILLARY
Glucose-Capillary: 128 mg/dL — ABNORMAL HIGH (ref 70–99)
Glucose-Capillary: 142 mg/dL — ABNORMAL HIGH (ref 70–99)
Glucose-Capillary: 147 mg/dL — ABNORMAL HIGH (ref 70–99)
Glucose-Capillary: 78 mg/dL (ref 70–99)
Glucose-Capillary: 95 mg/dL (ref 70–99)
Glucose-Capillary: 98 mg/dL (ref 70–99)

## 2020-04-25 NOTE — Progress Notes (Signed)
PROGRESS NOTE  Vickie Alvarado  DOB: 1966/01/08  PCP: [facility] BWG:[id number]  DOA: 03/20/2020  LOS: 36 days   Chief complaint: Right-sided weakness, dysarthria, right facial droop  Brief narrative: Vickie Alvarado is a 54 y.o. year old female with medical history significant for HTN, HLD, COVID-19 infection (diagnosed 03/01/2020), dental infection and sinus infections.  Timeline of events:  8/25, patient presented with right-sided weakness, dysarthria and right facial droop also to have abscess in the left basal ganglia.  Patient was admitted to the hospital.  8/27, underwent stereotactic biopsy and aspiration of the abscess by neurosurgery and was transferred to ICU.  Wound culture resulted Streptococcus intermedius  8/27, MRI brain was obtained for worsening mental status.  It showed new large left PCA infarct along with scattered infarcts in the right basal ganglia and right thalamus concerning for either CVA related to septic emboli versus herniation syndrome. Neurosurgery believed patient developed the new development of the posterior cerebral artery distribution infarct due to to sequelae from herniation. Given her large hemisphere brain abscess with surrounding edema/cerebritis neurosurgery did not recommend decompressive surgery.  Patient was placed on a tapering course of Decadron which she completed by now.  8/28, PICC line placed  9/6, tracheostomy  9/9, PEG placement and tube feeding started  9/13, repeat CT head showed improvement in fluid collections blood showed persistent left parietal/occipital acute/subacute infarcts with improvement in midline shift.  Subjective: Patient was seen and examined this morning. Lying down in bed.  Tries to open eyes on sternal rub.  No other response. No recurrence of fever last 24 hours. Labs unremarkable.  Assessment/Plan: Acute toxic metabolic encephalopathy -Patient has poor neurological status.  She has  moments of eye tracking and spontaneous movement of left extremities.  However, she has a very poor overall neurological recovery. -Multifactorial: Brain abscess, brain tissue herniation, vasogenic edema, CVA -Unlikely to have meaningful recovery.  However patient's family remains hopeful.  PT/OT/palliative care on board. 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 -On IV Rocephin for 8 weeks course as directed by ID to complete on 05/17/2020. -Completed a course of Decadron. -Continue Keppra 500 mg twice daily for seizure prophylaxis. -Continue to monitor on IV Rocephin.  Repeat blood work Architectural technologist.  If fever persists, may need further investigation.  ##Fever -Yesterday, patient had episodes of fever probably because of pain during wound care.  No fever last 24 hours. 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  Acute hypoxic respiratory failure -status post trach 04/01/2020 -stable 4-5 L of O2 on trach collar.  -PCCM following.  Trach tube downsized to 6 -0 cuffless.  Continue 4-5 L oxygen by trach collar.  Diarrhea -Secondary to schedule laxatives.  Change to as needed.  Nutrition -Continue Prosource via feeding tube  GERD, stable -continue PPI per tube  Poorly controlled diabetes mellitus  -A1c 11.5  -Blood sugar currently controlled on  Lantus 20 units daily and NovoLog 3 units every 4 hours while on tube feeds. -Continue Accu-Cheks. Lab Results  Component Value Date   HGBA1C 11.5 (H) 03/22/2020   Recent Labs  Lab 04/24/20 1918 04/24/20 2306 04/25/20 0306 04/25/20 0716 04/25/20 1218  GLUCAP 116* 112*  95 142* 78   Anemia of  chronic disease -Hemoglobin stable.  Vitamin B12, folate and iron level adequate. Recent Labs    03/27/20 0443 03/28/20 0409 03/29/20 0515 03/31/20 1016 04/01/20 0620 04/04/20 1446 04/07/20 0435 04/13/20 0500 04/19/20 0200 04/24/20 0432  HGB 9.8* 10.6* 11.2* 11.6* 11.2* 11.6* 10.0* 10.6* 10.6* 10.9*   History of bipolar disorder, stable -Seroquel on hold.  Currently avoiding any sedative medication.  Pressure ulcer in gluteal cleft/sacral area,   -consistent with stage 2 pressure ulcer.  Wound care following.  Patient is getting hydrotherapy, debridement packing.  Goals of care -Currently remains full code.  Family remains hopeful despite low likelihood of meaningful recovery.  Palliative care consult appreciated patient remains full code.  Mobility: PT eval when able to follow command Code Status:   Code Status: Full Code  Nutritional status: There is no height or weight on file to calculate BMI. Nutrition Problem: Increased nutrient needs Etiology: post-op healing Signs/Symptoms: estimated needs Diet Order            Diet NPO time specified  Diet effective midnight                 DVT prophylaxis: heparin injection 5,000 Units Start: 04/04/20 2200 SCDs Start: 03/20/20 1640   Antimicrobials:  IV Rocephin Fluid: tube feeding at 65 ml/h. Consultants: ID, neurosurgery, neurology Family Communication:  Daughter at bedside  Status is: Inpatient  Remains inpatient appropriate because: SNF not available yet  Dispo: The patient is from: Home              Anticipated d/c is to: SNF              Anticipated d/c date is: 1 to 2 days              Patient currently is not medically stable to d/c.   Infusions:  . cefTRIAXone (ROCEPHIN)  IV 2 g (04/25/20 0907)  . dextrose 5% lactated ringers 30 mL/hr at 04/22/20 1530  . feeding supplement (JEVITY 1.5 CAL/FIBER) 1,000 mL (04/24/20 0551)    Scheduled Meds: . chlorhexidine gluconate (MEDLINE KIT)  15 mL Mouth Rinse  BID  . Chlorhexidine Gluconate Cloth  6 each Topical Daily  . collagenase   Topical Daily  . docusate  100 mg Per Tube BID  . feeding supplement (PROSource TF)  45 mL Per Tube TID  . folic acid  1 mg Per Tube Daily  . heparin injection (subcutaneous)  5,000 Units Subcutaneous Q8H  . insulin aspart  0-20 Units Subcutaneous Q4H  . insulin aspart  3 Units Subcutaneous Q4H  . insulin glargine  20 Units Subcutaneous Daily  . levETIRAcetam  500 mg Per Tube BID  . mouth rinse  15 mL Mouth Rinse 10 times per day  . nutrition supplement (JUVEN)  1 packet Per Tube BID BM  . pantoprazole sodium  40 mg Per Tube QHS  . sodium chloride flush  10-40 mL Intracatheter Q12H  . sorbitol, milk of mag, mineral oil, glycerin (SMOG) enema  960 mL Rectal Once    Antimicrobials: Anti-infectives (From admission, onward)   Start     Dose/Rate Route Frequency Ordered Stop   04/14/20 1715  fluconazole (DIFLUCAN) tablet 150 mg        150 mg Per Tube  Once 04/14/20 1614 04/14/20 1813   04/04/20 1000  ceFAZolin (ANCEF) IVPB 2g/100 mL premix        2 g 200 mL/hr over 30 Minutes Intravenous  To Radiology 04/04/20 0935 04/04/20 1053   04/03/20 0600  ceFAZolin (ANCEF) IVPB 1 g/50 mL premix        1 g 100 mL/hr over 30 Minutes Intravenous To Radiology 04/01/20 1239 04/04/20 0600   03/24/20 1030  vancomycin (VANCOREADY) IVPB 1500 mg/300 mL  Status:  Discontinued        1,500 mg 150 mL/hr over 120 Minutes Intravenous Every 8 hours 03/24/20 0952 03/24/20 1336   03/23/20 0130  vancomycin (VANCOCIN) IVPB 1000 mg/200 mL premix  Status:  Discontinued        1,000 mg 200 mL/hr over 60 Minutes Intravenous Every 8 hours 03/22/20 1647 03/24/20 0952   03/22/20 2200  cefTRIAXone (ROCEPHIN) 2 g in sodium chloride 0.9 % 100 mL IVPB        2 g 200 mL/hr over 30 Minutes Intravenous Every 12 hours 03/22/20 1617     03/22/20 1730  vancomycin (VANCOREADY) IVPB 1750 mg/350 mL        1,750 mg 175 mL/hr over 120 Minutes Intravenous   Once 03/22/20 1647 03/22/20 2030   03/22/20 1630  metroNIDAZOLE (FLAGYL) IVPB 500 mg  Status:  Discontinued        500 mg 100 mL/hr over 60 Minutes Intravenous Every 8 hours 03/22/20 1618 03/24/20 1350   03/22/20 1336  bacitracin 50,000 Units in sodium chloride 0.9 % 500 mL irrigation  Status:  Discontinued          As needed 03/22/20 1336 03/22/20 1341   03/22/20 1315  vancomycin (VANCOCIN) IVPB 1000 mg/200 mL premix  Status:  Discontinued        1,000 mg 200 mL/hr over 60 Minutes Intravenous To Surgery 03/22/20 1313 03/22/20 1545   03/22/20 1315  ceFEPIme (MAXIPIME) 2 g in sodium chloride 0.9 % 100 mL IVPB        2 g 200 mL/hr over 30 Minutes Intravenous To Surgery 03/22/20 1313 03/22/20 1354   03/22/20 1145  ceFAZolin (ANCEF) IVPB 2g/100 mL premix  Status:  Discontinued        2 g 200 mL/hr over 30 Minutes Intravenous  Once 03/22/20 1131 03/22/20 1545   03/22/20 1138  ceFAZolin (ANCEF) 2-4 GM/100ML-% IVPB       Note to Pharmacy: Gleason, Ginger   : cabinet override      03/22/20 1138 03/22/20 2344      PRN meds: acetaminophen (TYLENOL) oral liquid 160 mg/5 mL, acetaminophen (TYLENOL) oral liquid 160 mg/5 mL **OR** acetaminophen, hydrALAZINE, labetalol, ondansetron (ZOFRAN) IV, oxyCODONE-acetaminophen, polyethylene glycol, promethazine, sodium chloride flush   Objective: Vitals:   04/25/20 1144 04/25/20 1219  BP:  136/81  Pulse:  88  Resp: 16 (!) 21  Temp:  99.1 F (37.3 C)  SpO2:  91%    Intake/Output Summary (Last 24 hours) at 04/25/2020 1402 Last data filed at 04/25/2020 0308 Gross per 24 hour  Intake 210 ml  Output 650 ml  Net -440 ml   Filed Weights   04/24/20 0130 04/24/20 2210 04/25/20 0107  Weight: (!) 0.03 kg (!) 0.03 kg (!) 0.003 kg   Weight change: 0 kg There is no height or weight on file to calculate BMI.   Physical Exam: General exam: Poor neurological status.  Eyes closed.  Opens eyes intermittently.  Unable to follow any other command.   Skin: No  rashes, lesions or ulcers. HEENT: Atraumatic, normocephalic,no obvious bleeding.  Trach collar anteriorly Lungs: Clear to auscultation bilaterally CVS: Regular rate and rhythm, no murmur GI/Abd soft,  nontender, nondistended, bowel sound present CNS: Poor neurological status.  Spontaneous movement of left upper extremity seen.  Able to open eyes on touch.  Unable to follow any other commands or have a conversation. Psychiatry: Unable to examine Extremities: No pedal edema, no calf tenderness  Data Review: I have personally reviewed the laboratory data and studies available.  Recent Labs  Lab 04/19/20 0200 04/24/20 0432  WBC 2.9* 8.3  NEUTROABS 1.0* 5.1  HGB 10.6* 10.9*  HCT 32.8* 34.4*  MCV 92.9 91.7  PLT 303 394   Recent Labs  Lab 04/19/20 0200 04/24/20 0432  NA 139 139  K 3.6 3.5  CL 104 104  CO2 26 26  GLUCOSE 100* 108*  BUN 11 11  CREATININE 0.33* 0.39*  CALCIUM 8.0* 8.5*  MG 1.9  --   PHOS 3.5  --    Signed, Terrilee Croak, MD [facility] 04/25/2020

## 2020-04-25 NOTE — TOC Progression Note (Addendum)
Transition of Care [facility]) - Progression Note    Patient Details  Name: Litzy Dicker MRN: [id number] Date of Birth: Mar 30, 1966  Transition of Care [facility]) CM/SW Contact  Beckie Busing, RN Phone Number: [phone]  04/25/2020, 11:51 AM  Clinical Narrative:    Patient has bed offer from [facility], still awaiting daughters decision to accept bed at this facility. Dr. Pola Corn has asked CM to determine in the facility will be able to provide Hydrotherapy for wound care. CM called Lorri  who is the admission liaison. Lorrie confirms that the facility is not able to provide hydrotheray therefore unable to make a bed offer.  MD made aware. Daughter Lanier Ensign has also been updated.    Expected Discharge Plan: Skilled Nursing Facility Barriers to Discharge: Continued Medical Work up  Expected Discharge Plan and Services Expected Discharge Plan: Skilled Nursing Facility In-house Referral: NA Discharge Planning Services: CM Consult   Living arrangements for the past 2 months: Single Family Home                 DME Arranged: N/A DME Agency: NA       HH Arranged: NA HH Agency: NA         Social Determinants of Health (SDOH) Interventions    Readmission Risk Interventions No flowsheet data found.

## 2020-04-25 NOTE — Progress Notes (Signed)
Physical Therapy Wound Treatment Patient Details  Name: Vickie Alvarado MRN: [id number] Date of Birth: 06-01-1966  Today's Date: 04/25/2020 Time:  -     Subjective  Subjective: Pt not able to communicate verbally, but slight smile prior to hydrotherapy and definite glare after hydrotherapy, tried to educate pt on need for wound care Patient and Family Stated Goals: None stated Date of Onset:  (unknown)  Pain Score: Pt with decreased pain during hydrotherapy, with stronger pain medication onboard. Facial grimace 2/10 with treatment.    Wound Assessment  Wound / Incision (Open or Dehisced) 04/09/20 Other (Comment) Coccyx Unstageable when assessed on 04/16/20 (Active)  Wound Image   04/24/20 1100  Dressing Type ABD;Barrier Film (skin prep);Normal saline moist dressing;Tape dressing 04/25/20 1200  Dressing Changed Changed 04/25/20 1200  Dressing Status Clean;Dry;Intact 04/25/20 1200  Dressing Change Frequency Daily 04/25/20 1200  Site / Wound Assessment Bleeding;Granulation tissue;Pink;Red;Yellow 04/25/20 1200  % Wound base Red or Granulating 50% 04/25/20 1200  % Wound base Yellow/Fibrinous Exudate 50% 04/25/20 1200  % Wound base Black/Eschar 0% 04/25/20 1200  % Wound base Other/Granulation Tissue (Comment) 0% 04/25/20 1200  Peri-wound Assessment Maceration;Pink;Bleeding;Erythema (blanchable) 04/25/20 1200  Wound Length (cm) 8.4 cm 04/24/20 1100  Wound Width (cm) 9.1 cm 04/24/20 1100  Wound Depth (cm) 2.6 cm 04/24/20 1100  Wound Volume (cm^3) 198.74 cm^3 04/24/20 1100  Wound Surface Area (cm^2) 76.44 cm^2 04/24/20 1100  Undermining (cm) 0.5 -2.5 cm from 9 o'clock to 3 o' clock  04/25/20 1200  Margins Unattached edges (unapproximated) 04/25/20 1200  Closure Adhesive strips 04/23/20 2000  Drainage Amount Moderate 04/25/20 1200  Drainage Description Serosanguineous 04/25/20 1200  Treatment Debridement (Selective);Hydrotherapy (Pulse lavage);Packing (Saline gauze);Other (Comment) 04/25/20  1200   Santyl applied to necrotic tissue   Hydrotherapy Pulsed lavage therapy - wound location: Sacrum Pulsed Lavage with Suction (psi): 12 psi Pulsed Lavage with Suction - Normal Saline Used: 1000 mL Pulsed Lavage Tip: Tip with splash shield Selective Debridement Selective Debridement - Location: Sacrum Selective Debridement - Tools Used: Forceps;Scalpel Selective Debridement - Tissue Removed: yellow and brown necrotic tissue   Wound Assessment and Plan  Wound Therapy - Assess/Plan/Recommendations Wound Therapy - Clinical Statement: Pt with more awareness, improved pain response to hydrotherapy with stronger pain medication. Wound is progressing and will likely only require dressing changes in the near future, until that time pt will continue to benefit from pulse lavage and selective debridement to decrease bioburden and promote further healing.  Wound Therapy - Functional Problem List: Decreased overall mobility  Factors Delaying/Impairing Wound Healing: Immobility;Multiple medical problems Hydrotherapy Plan: Debridement;Dressing change;Patient/family education;Pulsatile lavage with suction Wound Therapy - Frequency: 6X / week Wound Therapy - Follow Up Recommendations: Skilled nursing facility Wound Plan: See above  Wound Therapy Goals- Improve the function of patient's integumentary system by progressing the wound(s) through the phases of wound healing (inflammation - proliferation - remodeling) by: Decrease Necrotic Tissue to: 20% Decrease Necrotic Tissue - Progress: Progressing toward goal Increase Granulation Tissue to: 80% Increase Granulation Tissue - Progress: Progressing toward goal Goals/treatment plan/discharge plan were made with and agreed upon by patient/family: No, Patient unable to participate in goals/treatment/discharge plan and family unavailable Time For Goal Achievement: 7 days Wound Therapy - Potential for Goals: Fair  Goals will be updated until maximal  potential achieved or discharge criteria met.  Discharge criteria: when goals achieved, discharge from hospital, MD decision/surgical intervention, no progress towards goals, refusal/missing three consecutive treatments without notification or medical reason.  GP  Xoey Warmoth B. Migdalia Dk PT, DPT Acute Rehabilitation Services Pager [phone] Office [phone]  [place] 04/25/2020, 12:48 PM

## 2020-04-26 DIAGNOSIS — G060 Intracranial abscess and granuloma: Secondary | ICD-10-CM | POA: Diagnosis not present

## 2020-04-26 DIAGNOSIS — R627 Adult failure to thrive: Secondary | ICD-10-CM

## 2020-04-26 LAB — GLUCOSE, CAPILLARY
Glucose-Capillary: 129 mg/dL — ABNORMAL HIGH (ref 70–99)
Glucose-Capillary: 112 mg/dL — ABNORMAL HIGH (ref 70–99)
Glucose-Capillary: 104 mg/dL — ABNORMAL HIGH (ref 70–99)
Glucose-Capillary: 90 mg/dL (ref 70–99)
Glucose-Capillary: 86 mg/dL (ref 70–99)

## 2020-04-26 NOTE — Progress Notes (Signed)
Physical Therapy Wound Treatment Patient Details  Name: Vickie Alvarado MRN: [id number] Date of Birth: 10/18/1965  Today's Date: 04/26/2020 Time: 8177-1165 Time Calculation (min): 33 min  Subjective  Subjective: Pt not able to communicate verbally, but slight smile prior to hydrotherapy and definite glare after hydrotherapy, tried to educate pt on need for wound care Patient and Family Stated Goals: None stated Date of Onset:  (unknown)  Pain Score:    Wound Assessment  Wound / Incision (Open or Dehisced) 04/09/20 Other (Comment) Coccyx Unstageable when assessed on 04/16/20 (Active)  Dressing Type ABD;Barrier Film (skin prep);Gauze (Comment);Moist to dry 04/26/20 1451  Dressing Changed Changed 04/26/20 1451  Dressing Status Clean;Intact 04/26/20 1451  Dressing Change Frequency Daily 04/26/20 1451  Site / Wound Assessment Bleeding;Pale;Pink;Yellow 04/26/20 1451  % Wound base Red or Granulating 55% 04/26/20 1451  % Wound base Yellow/Fibrinous Exudate 45% 04/26/20 1451  % Wound base Black/Eschar 0% 04/26/20 1451  % Wound base Other/Granulation Tissue (Comment) 0% 04/26/20 1451  Peri-wound Assessment Erythema (blanchable);Intact 04/26/20 1451  Wound Length (cm) 8.4 cm 04/24/20 1100  Wound Width (cm) 9.1 cm 04/24/20 1100  Wound Depth (cm) 2.6 cm 04/24/20 1100  Wound Volume (cm^3) 198.74 cm^3 04/24/20 1100  Wound Surface Area (cm^2) 76.44 cm^2 04/24/20 1100  Undermining (cm) 0.5 -2.5 cm from 9 o'clock to 3 o' clock  04/25/20 1200  Margins Unattached edges (unapproximated) 04/26/20 1451  Closure Adhesive strips 04/23/20 2000  Drainage Amount Moderate 04/25/20 1200  Drainage Description Serosanguineous 04/25/20 1200  Treatment Debridement (Selective);Hydrotherapy (Pulse lavage);Packing (Saline gauze);Other (Comment) 04/25/20 1200  Santyl applied to wound bed prior to applying dressing.    Hydrotherapy Pulsed lavage therapy - wound location: Sacrum Pulsed Lavage with Suction (psi): 12  psi Pulsed Lavage with Suction - Normal Saline Used: 1000 mL Pulsed Lavage Tip: Tip with splash shield Selective Debridement Selective Debridement - Location: Sacrum Selective Debridement - Tools Used: Forceps;Scalpel Selective Debridement - Tissue Removed: yellow and brown necrotic tissue   Wound Assessment and Plan  Wound Therapy - Assess/Plan/Recommendations Wound Therapy - Clinical Statement: Pt with minimal awareness, but with good pain response to hydrotherapy with stronger pain medication. Wound is progressing and will likely only require dressing changes in the near future, until that time pt will continue to benefit from pulse lavage and selective debridement to decrease bioburden and promote further healing.  Wound Therapy - Functional Problem List: Decreased overall mobility  Factors Delaying/Impairing Wound Healing: Immobility;Multiple medical problems Hydrotherapy Plan: Debridement;Dressing change;Patient/family education;Pulsatile lavage with suction Wound Therapy - Frequency: 6X / week Wound Therapy - Follow Up Recommendations: Skilled nursing facility Wound Plan: See above  Wound Therapy Goals- Improve the function of patient's integumentary system by progressing the wound(s) through the phases of wound healing (inflammation - proliferation - remodeling) by: Decrease Necrotic Tissue to: 20% Decrease Necrotic Tissue - Progress: Progressing toward goal Increase Granulation Tissue to: 80% Increase Granulation Tissue - Progress: Progressing toward goal Goals/treatment plan/discharge plan were made with and agreed upon by patient/family: No, Patient unable to participate in goals/treatment/discharge plan and family unavailable Time For Goal Achievement: 7 days Wound Therapy - Potential for Goals: Fair  Goals will be updated until maximal potential achieved or discharge criteria met.  Discharge criteria: when goals achieved, discharge from hospital, MD decision/surgical  intervention, no progress towards goals, refusal/missing three consecutive treatments without notification or medical reason.  GP     Tessie Fass Carra Brindley 04/26/2020, 2:57 PM 04/26/2020  Ginger Carne., PT [facility] [phone]  [phone]  pager) [phone]  (office)

## 2020-04-26 NOTE — Progress Notes (Signed)
Pt was alert and seem to be trying to respond with gestures. CGB were stayed in the low 100's with no acute concerns will continue to monitor.

## 2020-04-26 NOTE — Progress Notes (Addendum)
PROGRESS NOTE  Vickie Alvarado  DOB: 11-Aug-1965  PCP: [facility] WNI:[id number]  DOA: 03/20/2020  LOS: 37 days   Chief complaint: Right-sided weakness, dysarthria, right facial droop  Brief narrative: Vickie Alvarado is a 54 y.o. year old female with medical history significant for HTN, HLD, COVID-19 infection (diagnosed 03/01/2020), dental infection and sinus infections.  Timeline of events:  8/25, patient presented with right-sided weakness, dysarthria and right facial droop also to have abscess in the left basal ganglia.  Patient was admitted to the hospital.  8/27, underwent stereotactic biopsy and aspiration of the abscess by neurosurgery and was transferred to ICU.  Wound culture resulted Streptococcus intermedius  8/27, MRI brain was obtained for worsening mental status.  It showed new large left PCA infarct along with scattered infarcts in the right basal ganglia and right thalamus concerning for either CVA related to septic emboli versus herniation syndrome. Neurosurgery believed patient developed the new development of the posterior cerebral artery distribution infarct due to to sequelae from herniation. Given her large hemisphere brain abscess with surrounding edema/cerebritis neurosurgery did not recommend decompressive surgery.  Patient was placed on a tapering course of Decadron which she completed by now.  8/28, PICC line placed  9/6, tracheostomy  9/9, PEG placement and tube feeding started  9/13, repeat CT head showed improvement in fluid collections blood showed persistent left parietal/occipital acute/subacute infarcts with improvement in midline shift.  Patient's hospital stay is prolonged because of inability to find an appropriate SNF for her.  She has a tracheostomy tube and PEG tube requirement.  Social worker was able to find a place in [place] which family refused to accept because 'it has been reviews.'  Subjective: Patient was seen  and examined this morning. Lying down in bed.  Eyes closed.  Tries to open on sternal rub. No recurrence of fever.  Has 2 episodes of loose bowel movement in last 24 hours.  Assessment/Plan: ##Fever/diarrhea -Patient had episodes of low-grade fever yesterday after pressure ulcer wound manipulation.  Fever subsided on its own. - patient had 2 episodes of diarrhea last 24 hours.  Continue to monitor.  If diarrhea continues and fever recurs, may need to stool studies. -Laxatives have been stopped.  Acute toxic metabolic encephalopathy -Patient has poor but waxing and waning neurological status.  She has moments of eye tracking and spontaneous movement of left extremities.  However, she has a very poor overall neurological recovery. -Multifactorial: Brain abscess, brain tissue herniation, vasogenic edema, CVA -Unlikely to have meaningful recovery.  However patient's family remains hopeful.  PT/OT/palliative care on board. 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 -On IV Rocephin for 8 weeks course as directed by ID to complete on 05/17/2020. -Completed a course of Decadron. -Continue Keppra 500 mg twice daily for seizure prophylaxis. -Continue to monitor on IV Rocephin.    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  Acute hypoxic respiratory failure -status post trach 04/01/2020 -stable 4-5 L of O2 on trach collar.  -PCCM following.  Trach tube downsized to 6 -0 cuffless. Continue 4-5 L oxygen by trach collar.  Nutrition -Continue Prosource via feeding tube  GERD, stable -continue PPI per tube  Poorly controlled diabetes mellitus  -A1c 11.5 on 8/27 -Blood sugar currently controlled on  Lantus 20 units daily and  NovoLog 3 units every 4 hours while on tube feeds. -Continue Accu-Cheks. Recent Labs  Lab 04/25/20 1930 04/25/20 2336 04/26/20 0345 04/26/20 0802 04/26/20 1208  GLUCAP 147* 98 129* 112* 104*   Anemia of chronic disease -Hemoglobin stable.  Vitamin B12, folate and iron level adequate. Recent Labs    03/27/20 0443 03/28/20 0409 03/29/20 0515 03/31/20 1016 04/01/20 0620 04/04/20 1446 04/07/20 0435 04/13/20 0500 04/19/20 0200 04/24/20 0432  HGB 9.8* 10.6* 11.2* 11.6* 11.2* 11.6* 10.0* 10.6* 10.6* 10.9*   History of bipolar disorder, stable -Seroquel on hold.  Currently avoiding any sedative medication.  Pressure ulcer in gluteal cleft/sacral area,   -consistent with stage 2 pressure ulcer.  Wound care following.  Patient is getting hydrotherapy, debridement packing.  Goals of care -Currently remains full code.  Family remains hopeful despite low likelihood of meaningful recovery.  Palliative care consult appreciated. Patient remains full code.  Mobility: PT eval when able to follow command Code Status:   Code Status: Full Code  Nutritional status: Body mass index is 50.76 kg/m. Nutrition Problem: Increased nutrient needs Etiology: post-op healing Signs/Symptoms: estimated needs Diet Order            Diet NPO time specified  Diet effective midnight                 DVT prophylaxis: heparin injection 5,000 Units Start: 04/04/20 2200 SCDs Start: 03/20/20 1640   Antimicrobials:  IV Rocephin Fluid: tube feeding at 65 ml/h. Consultants: ID, neurosurgery, neurology, palliative care Family Communication:  None at bedside  Status is: Inpatient  Remains inpatient appropriate because: SNF not available yet  Dispo: The patient is from: Home              Anticipated d/c is to: SNF              Anticipated d/c date is: Pending SNF availability              Patient currently is not medically stable to d/c.   Infusions:  . cefTRIAXone (ROCEPHIN)  IV 2 g (04/26/20  1227)  . dextrose 5% lactated ringers 30 mL/hr at 04/26/20 0450  . feeding supplement (JEVITY 1.5 CAL/FIBER) 1,000 mL (04/24/20 0551)    Scheduled Meds: . chlorhexidine gluconate (MEDLINE KIT)  15 mL Mouth Rinse BID  . Chlorhexidine Gluconate Cloth  6 each Topical Daily  . collagenase   Topical Daily  . docusate  100 mg Per Tube BID  . feeding supplement (PROSource TF)  45 mL Per Tube TID  . folic acid  1 mg Per Tube Daily  . heparin injection (subcutaneous)  5,000 Units Subcutaneous Q8H  . insulin aspart  0-20 Units Subcutaneous Q4H  . insulin aspart  3 Units Subcutaneous Q4H  . insulin glargine  20 Units Subcutaneous Daily  . levETIRAcetam  500 mg Per Tube BID  . mouth rinse  15 mL Mouth Rinse 10 times per day  . nutrition supplement (JUVEN)  1 packet Per Tube BID BM  . pantoprazole sodium  40 mg Per Tube QHS  . sodium chloride flush  10-40 mL Intracatheter Q12H  . sorbitol, milk of mag, mineral oil, glycerin (SMOG) enema  960 mL Rectal Once    Antimicrobials: Anti-infectives (From admission, onward)   Start     Dose/Rate Route Frequency Ordered Stop   04/14/20 1715  fluconazole (DIFLUCAN) tablet 150 mg        150 mg Per Tube  Once 04/14/20 1614 04/14/20 1813   04/04/20 1000  ceFAZolin (ANCEF) IVPB 2g/100 mL premix        2 g 200 mL/hr over 30 Minutes Intravenous To Radiology 04/04/20 0935 04/04/20 1053   04/03/20 0600  ceFAZolin (ANCEF) IVPB 1 g/50 mL premix        1 g 100 mL/hr over 30 Minutes Intravenous To Radiology 04/01/20 1239 04/04/20 0600   03/24/20 1030  vancomycin (VANCOREADY) IVPB 1500 mg/300 mL  Status:  Discontinued        1,500 mg 150 mL/hr over 120 Minutes Intravenous Every 8 hours 03/24/20 0952 03/24/20 1336   03/23/20 0130  vancomycin (VANCOCIN) IVPB 1000 mg/200 mL premix  Status:  Discontinued        1,000 mg 200 mL/hr over 60 Minutes Intravenous Every 8 hours 03/22/20 1647 03/24/20 0952   03/22/20 2200  cefTRIAXone (ROCEPHIN) 2 g in sodium chloride 0.9 %  100 mL IVPB        2 g 200 mL/hr over 30 Minutes Intravenous Every 12 hours 03/22/20 1617     03/22/20 1730  vancomycin (VANCOREADY) IVPB 1750 mg/350 mL        1,750 mg 175 mL/hr over 120 Minutes Intravenous  Once 03/22/20 1647 03/22/20 2030   03/22/20 1630  metroNIDAZOLE (FLAGYL) IVPB 500 mg  Status:  Discontinued        500 mg 100 mL/hr over 60 Minutes Intravenous Every 8 hours 03/22/20 1618 03/24/20 1350   03/22/20 1336  bacitracin 50,000 Units in sodium chloride 0.9 % 500 mL irrigation  Status:  Discontinued          As needed 03/22/20 1336 03/22/20 1341   03/22/20 1315  vancomycin (VANCOCIN) IVPB 1000 mg/200 mL premix  Status:  Discontinued        1,000 mg 200 mL/hr over 60 Minutes Intravenous To Surgery 03/22/20 1313 03/22/20 1545   03/22/20 1315  ceFEPIme (MAXIPIME) 2 g in sodium chloride 0.9 % 100 mL IVPB        2 g 200 mL/hr over 30 Minutes Intravenous To Surgery 03/22/20 1313 03/22/20 1354   03/22/20 1145  ceFAZolin (ANCEF) IVPB 2g/100 mL premix  Status:  Discontinued        2 g 200 mL/hr over 30 Minutes Intravenous  Once 03/22/20 1131 03/22/20 1545   03/22/20 1138  ceFAZolin (ANCEF) 2-4 GM/100ML-% IVPB       Note to Pharmacy: Gleason, Ginger   : cabinet override      03/22/20 1138 03/22/20 2344      PRN meds: acetaminophen (TYLENOL) oral liquid 160 mg/5 mL, acetaminophen (TYLENOL) oral liquid 160 mg/5 mL **OR** acetaminophen, hydrALAZINE, labetalol, ondansetron (ZOFRAN) IV, oxyCODONE-acetaminophen, polyethylene glycol, promethazine, sodium chloride flush   Objective: Vitals:   04/26/20 1230 04/26/20 1300  BP:  (!) 143/79  Pulse: 75   Resp: 20   Temp:    SpO2: 97%     Intake/Output Summary (Last 24 hours) at 04/26/2020 1339 Last data filed at 04/26/2020 0502 Gross per 24 hour  Intake 560 ml  Output 2825 ml  Net -2265 ml   Filed Weights   04/24/20 2210 04/25/20 0107 04/26/20 0631  Weight: (!) 0.03 kg (!) 0.003 kg (!) 147 kg   Weight change: 147 kg Body mass  index is 50.76 kg/m.   Physical Exam: General exam: Poor neurological status.  Eyes closed.  Tries to open eyes on sternal rub.  Unable to follow any other command.   Skin: No  rashes, lesions or ulcers. HEENT: Atraumatic, normocephalic,no obvious bleeding.  Trach collar anteriorly Lungs: Clear to auscultation bilaterally CVS: Regular rate and rhythm, no murmur GI/Abd soft, nontender, nondistended, bowel sound present.  PEG tube in place. CNS: Poor neurological status.  Tries to open eyes on sternal rub  Pychiatry: Unable to examine Extremities: No pedal edema, no calf tenderness  Data Review: I have personally reviewed the laboratory data and studies available.  Recent Labs  Lab 04/24/20 0432  WBC 8.3  NEUTROABS 5.1  HGB 10.9*  HCT 34.4*  MCV 91.7  PLT 394   Recent Labs  Lab 04/24/20 0432  NA 139  K 3.5  CL 104  CO2 26  GLUCOSE 108*  BUN 11  CREATININE 0.39*  CALCIUM 8.5*   Signed, Terrilee Croak, MD [facility] 04/26/2020

## 2020-04-26 NOTE — Progress Notes (Signed)
Patient is noted with frequent loose stools. Reached out to MD on call, no new orders noted.Marland KitchenMarland Kitchen

## 2020-04-26 NOTE — Plan of Care (Signed)
  Problem: Education: Goal: Knowledge of General Education information will improve Description: Including pain rating scale, medication(s)/side effects and non-pharmacologic comfort measures Outcome: Progressing   Problem: Health Behavior/Discharge Planning: Goal: Ability to manage health-related needs will improve Outcome: Progressing   Problem: Clinical Measurements: Goal: Ability to maintain clinical measurements within normal limits will improve Outcome: Progressing Goal: Will remain free from infection Outcome: Progressing Goal: Diagnostic test results will improve Outcome: Progressing Goal: Respiratory complications will improve Outcome: Progressing Goal: Cardiovascular complication will be avoided Outcome: Progressing   Problem: Activity: Goal: Risk for activity intolerance will decrease Outcome: Progressing   Problem: Nutrition: Goal: Adequate nutrition will be maintained Outcome: Progressing   Problem: Coping: Goal: Level of anxiety will decrease Outcome: Progressing   Problem: Elimination: Goal: Will not experience complications related to bowel motility Outcome: Progressing Goal: Will not experience complications related to urinary retention Outcome: Progressing   Problem: Pain Managment: Goal: General experience of comfort will improve Outcome: Progressing   Problem: Safety: Goal: Ability to remain free from injury will improve Outcome: Progressing   Problem: Skin Integrity: Goal: Risk for impaired skin integrity will decrease Outcome: Progressing   Problem: Activity: Goal: Ability to tolerate increased activity will improve Outcome: Progressing   Problem: Respiratory: Goal: Ability to maintain a clear airway and adequate ventilation will improve Outcome: Progressing   

## 2020-04-27 DIAGNOSIS — G060 Intracranial abscess and granuloma: Secondary | ICD-10-CM | POA: Diagnosis not present

## 2020-04-27 LAB — GLUCOSE, CAPILLARY
Glucose-Capillary: 105 mg/dL — ABNORMAL HIGH (ref 70–99)
Glucose-Capillary: 114 mg/dL — ABNORMAL HIGH (ref 70–99)
Glucose-Capillary: 115 mg/dL — ABNORMAL HIGH (ref 70–99)
Glucose-Capillary: 119 mg/dL — ABNORMAL HIGH (ref 70–99)
Glucose-Capillary: 123 mg/dL — ABNORMAL HIGH (ref 70–99)
Glucose-Capillary: 128 mg/dL — ABNORMAL HIGH (ref 70–99)
Glucose-Capillary: 132 mg/dL — ABNORMAL HIGH (ref 70–99)

## 2020-04-27 NOTE — Plan of Care (Signed)
  Problem: Clinical Measurements: Goal: Ability to maintain clinical measurements within normal limits will improve Outcome: Progressing Goal: Will remain free from infection Outcome: Progressing Goal: Diagnostic test results will improve Outcome: Progressing Goal: Respiratory complications will improve Outcome: Progressing Goal: Cardiovascular complication will be avoided Outcome: Progressing   Problem: Activity: Goal: Risk for activity intolerance will decrease Outcome: Progressing   Problem: Coping: Goal: Level of anxiety will decrease Outcome: Progressing   Problem: Elimination: Goal: Will not experience complications related to bowel motility Outcome: Progressing Goal: Will not experience complications related to urinary retention Outcome: Progressing   Problem: Pain Managment: Goal: General experience of comfort will improve Outcome: Progressing   Problem: Safety: Goal: Ability to remain free from injury will improve Outcome: Progressing   Problem: Skin Integrity: Goal: Risk for impaired skin integrity will decrease Outcome: Progressing   Problem: Activity: Goal: Ability to tolerate increased activity will improve Outcome: Progressing   Problem: Respiratory: Goal: Ability to maintain a clear airway and adequate ventilation will improve Outcome: Progressing

## 2020-04-27 NOTE — Progress Notes (Addendum)
PROGRESS NOTE  Vickie Alvarado  DOB: 06-09-1966  PCP: [facility] BBC:[id number]  DOA: 03/20/2020  LOS: 38 days   Chief complaint: Right-sided weakness, dysarthria, right facial droop  Brief narrative: Vickie Alvarado is a 54 y.o. year old female with medical history significant for HTN, HLD, COVID-19 infection (diagnosed 03/01/2020), dental infection and sinus infections.  Timeline of events:  8/25, patient presented with right-sided weakness, dysarthria and right facial droop also to have abscess in the left basal ganglia.  Patient was admitted to the hospital.  8/27, underwent stereotactic biopsy and aspiration of the abscess by neurosurgery and was transferred to ICU.  Wound culture resulted Streptococcus intermedius  8/27, MRI brain was obtained for worsening mental status.  It showed new large left PCA infarct along with scattered infarcts in the right basal ganglia and right thalamus concerning for either CVA related to septic emboli versus herniation syndrome. Neurosurgery believed patient developed the new development of the posterior cerebral artery distribution infarct due to to sequelae from herniation. Given her large hemisphere brain abscess with surrounding edema/cerebritis neurosurgery did not recommend decompressive surgery.  Patient was placed on a tapering course of Decadron which she completed by now.  8/28, PICC line placed  9/6, tracheostomy  9/9, PEG placement and tube feeding started  9/13, repeat CT head showed improvement in fluid collections blood showed persistent left parietal/occipital acute/subacute infarcts with improvement in midline shift  9/27-28 low grade (100.4) fever x2 - resolved - no acute indication for change in management - loose stool noted - laxatives stopped with resolution  Patient's hospital stay is prolonged because of inability to find an appropriate SNF for her.  She has a tracheostomy, and PEG tube care  requirements.  Social worker was able to find a place in [place] which family refused to accept because 'it has bad reviews.'  Subjective: No acute issues or events overnight, patient remains stable, poorly interactive on exam reflexively squeezes hand but does not follow any specific commands, will localize to follow motion with eyes but does not voluntarily open her eyelids.  Assessment/Plan:  Acute toxic metabolic encephalopathy, POA, stable without resolution -Patient has poor but waxing and waning neurological status.  She has moments of eye tracking and spontaneous movement of left extremities.  However, she has a very poor overall neurological recovery. -Multifactorial: Brain abscess, brain tissue herniation, vasogenic edema, CVA -Unlikely to have meaningful recovery. However patient's family remains hopeful.  PT/OT/palliative care on board. 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 - Patient had two episodes of low-grade fever 9/27-28th  after pressure ulcer wound manipulation which has now resolved -On IV Rocephin for 8 weeks course as directed by ID to complete on 05/17/2020. -Completed a course of Decadron. -Continue Keppra 500 mg twice daily for seizure prophylaxis. -Continue to monitor on IV Rocephin.    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  Acute hypoxic respiratory failure -status post trach 04/01/2020 -stable 4-5 L of O2 on trach collar.  -PCCM following.  Trach tube downsized to 6 -0 cuffless. Continue 4-5 L oxygen by trach collar.  Nutrition -Continue Prosource via feeding tube  GERD, stable - continue PPI per tube  Poorly controlled diabetes mellitus  -A1c 11.5 on 8/27 -Blood sugar  currently controlled  on  Lantus 20 units daily and NovoLog 3 units every 4 hours while on tube feeds. -Continue Accu-Cheks. Recent Labs  Lab 04/26/20 1208 04/26/20 1902 04/26/20 2002 04/26/20 2359 04/27/20 0419  GLUCAP 104* 90 86 132* 115*   Anemia of chronic disease -Hemoglobin stable.  Vitamin B12, folate and iron level adequate. Recent Labs    03/27/20 0443 03/28/20 0409 03/29/20 0515 03/31/20 1016 04/01/20 0620 04/04/20 1446 04/07/20 0435 04/13/20 0500 04/19/20 0200 04/24/20 0432  HGB 9.8* 10.6* 11.2* 11.6* 11.2* 11.6* 10.0* 10.6* 10.6* 10.9*   History of bipolar disorder, stable -Seroquel on hold.  Currently avoiding any sedative medication.  Pressure ulcer in gluteal cleft/sacral area,   -consistent with stage 4 pressure ulcer.  Wound care following.  Patient is getting hydrotherapy, debridement packing.  Goals of care -Currently remains full code.  Family remains hopeful despite low likelihood of meaningful recovery.  Palliative care consult appreciated. Patient remains full code.  Code Status:   Code Status: Full Code  Nutritional status: Body mass index is 50.76 kg/m. Nutrition Problem: Increased nutrient needs Etiology: post-op healing Signs/Symptoms: estimated needs Diet Order            Diet NPO time specified  Diet effective midnight                 DVT prophylaxis: heparin injection 5,000 Units Start: 04/04/20 2200 SCDs Start: 03/20/20 1640   Antimicrobials:  IV Rocephin Fluid: tube feeding at 65 ml/h. Consultants: ID, neurosurgery, neurology, palliative care Family Communication:  None at bedside  Status is: Inpatient  Remains inpatient appropriate because: SNF not available yet  Dispo: The patient is from: Home              Anticipated d/c is to: SNF              Anticipated d/c date is: Pending SNF availability              Patient currently is not medically stable to d/c.  Infusions:  . cefTRIAXone (ROCEPHIN)  IV 2 g (04/26/20  2221)  . dextrose 5% lactated ringers 30 mL/hr at 04/26/20 0450  . feeding supplement (JEVITY 1.5 CAL/FIBER) 1,000 mL (04/26/20 1630)    Scheduled Meds: . chlorhexidine gluconate (MEDLINE KIT)  15 mL Mouth Rinse BID  . Chlorhexidine Gluconate Cloth  6 each Topical Daily  . collagenase   Topical Daily  . docusate  100 mg Per Tube BID  . feeding supplement (PROSource TF)  45 mL Per Tube TID  . folic acid  1 mg Per Tube Daily  . heparin injection (subcutaneous)  5,000 Units Subcutaneous Q8H  . insulin aspart  0-20 Units Subcutaneous Q4H  . insulin aspart  3 Units Subcutaneous Q4H  . insulin glargine  20 Units Subcutaneous Daily  . levETIRAcetam  500 mg Per Tube BID  . mouth rinse  15 mL Mouth Rinse 10 times per day  . nutrition supplement (JUVEN)  1 packet Per Tube BID BM  . pantoprazole sodium  40 mg Per Tube QHS  . sodium chloride flush  10-40 mL Intracatheter Q12H  . sorbitol, milk of mag, mineral oil, glycerin (SMOG) enema  960 mL Rectal Once    Antimicrobials: Anti-infectives (From admission, onward)   Start     Dose/Rate Route Frequency Ordered Stop   04/14/20 1715  fluconazole (DIFLUCAN) tablet 150 mg        150 mg Per Tube  Once 04/14/20 1614 04/14/20 1813   04/04/20  1000  ceFAZolin (ANCEF) IVPB 2g/100 mL premix        2 g 200 mL/hr over 30 Minutes Intravenous To Radiology 04/04/20 0935 04/04/20 1053   04/03/20 0600  ceFAZolin (ANCEF) IVPB 1 g/50 mL premix        1 g 100 mL/hr over 30 Minutes Intravenous To Radiology 04/01/20 1239 04/04/20 0600   03/24/20 1030  vancomycin (VANCOREADY) IVPB 1500 mg/300 mL  Status:  Discontinued        1,500 mg 150 mL/hr over 120 Minutes Intravenous Every 8 hours 03/24/20 0952 03/24/20 1336   03/23/20 0130  vancomycin (VANCOCIN) IVPB 1000 mg/200 mL premix  Status:  Discontinued        1,000 mg 200 mL/hr over 60 Minutes Intravenous Every 8 hours 03/22/20 1647 03/24/20 0952   03/22/20 2200  cefTRIAXone (ROCEPHIN) 2 g in sodium chloride 0.9 %  100 mL IVPB        2 g 200 mL/hr over 30 Minutes Intravenous Every 12 hours 03/22/20 1617     03/22/20 1730  vancomycin (VANCOREADY) IVPB 1750 mg/350 mL        1,750 mg 175 mL/hr over 120 Minutes Intravenous  Once 03/22/20 1647 03/22/20 2030   03/22/20 1630  metroNIDAZOLE (FLAGYL) IVPB 500 mg  Status:  Discontinued        500 mg 100 mL/hr over 60 Minutes Intravenous Every 8 hours 03/22/20 1618 03/24/20 1350   03/22/20 1336  bacitracin 50,000 Units in sodium chloride 0.9 % 500 mL irrigation  Status:  Discontinued          As needed 03/22/20 1336 03/22/20 1341   03/22/20 1315  vancomycin (VANCOCIN) IVPB 1000 mg/200 mL premix  Status:  Discontinued        1,000 mg 200 mL/hr over 60 Minutes Intravenous To Surgery 03/22/20 1313 03/22/20 1545   03/22/20 1315  ceFEPIme (MAXIPIME) 2 g in sodium chloride 0.9 % 100 mL IVPB        2 g 200 mL/hr over 30 Minutes Intravenous To Surgery 03/22/20 1313 03/22/20 1354   03/22/20 1145  ceFAZolin (ANCEF) IVPB 2g/100 mL premix  Status:  Discontinued        2 g 200 mL/hr over 30 Minutes Intravenous  Once 03/22/20 1131 03/22/20 1545   03/22/20 1138  ceFAZolin (ANCEF) 2-4 GM/100ML-% IVPB       Note to Pharmacy: Gleason, Ginger   : cabinet override      03/22/20 1138 03/22/20 2344      PRN meds: acetaminophen (TYLENOL) oral liquid 160 mg/5 mL, acetaminophen (TYLENOL) oral liquid 160 mg/5 mL **OR** acetaminophen, hydrALAZINE, labetalol, ondansetron (ZOFRAN) IV, oxyCODONE-acetaminophen, polyethylene glycol, promethazine, sodium chloride flush   Objective: Vitals:   04/27/20 0351 04/27/20 0421  BP:  132/81  Pulse: 95 84  Resp: (!) 25 19  Temp:    SpO2: 99% 98%    Intake/Output Summary (Last 24 hours) at 04/27/2020 0820 Last data filed at 04/26/2020 2219 Gross per 24 hour  Intake 10 ml  Output 650 ml  Net -640 ml   Filed Weights   04/24/20 2210 04/25/20 0107 04/26/20 0631  Weight: (!) 0.03 kg (!) 0.003 kg (!) 147 kg   Weight change:  Body mass  index is 50.76 kg/m.   Physical Exam: General: Resting comfortably in bed, minimally interactive, does not follow commands, minimal reaction to sternal rub or nailbed pressure. HEENT: Tracks movements if eyelids propped open but will not open eyelids to stimuli.   Neck:  Without mass or deformity. Tracheostomy clean dry intact, minimal sputum Lungs:  Clear to auscultate bilaterally without rhonchi, wheeze, or rales. Heart:  Regular rate and rhythm.  Without murmurs, rubs, or gallops. Abdomen: PEG tube bandage clean dry intact soft, nontender, nondistended.  Without guarding or rebound.  Foley catheter in place. Extremities: Without cyanosis, clubbing, edema, or obvious deformity. Vascular:  Dorsalis pedis and posterior tibial pulses palpable bilaterally.  Right proximal arm double-lumen PICC bandage clean dry intact Skin:  Warm and dry, no erythema, stage 4 coccygeal ulcer (defer to wound care notes for further description)   Data Review: I have personally reviewed the laboratory data and studies available.  Recent Labs  Lab 04/24/20 0432  WBC 8.3  NEUTROABS 5.1  HGB 10.9*  HCT 34.4*  MCV 91.7  PLT 394   Recent Labs  Lab 04/24/20 0432  NA 139  K 3.5  CL 104  CO2 26  GLUCOSE 108*  BUN 11  CREATININE 0.39*  CALCIUM 8.5*   Signed, Little Ishikawa, DO [facility] 04/27/2020

## 2020-04-27 NOTE — Progress Notes (Signed)
Physical Therapy Wound Treatment Patient Details  Name: Vickie Alvarado MRN: [id number] Date of Birth: 13-Jan-1966  Today's Date: 04/27/2020 Time: 2706-2376 Time Calculation (min): 47 min  Subjective  Subjective: Pt not communicating verbally. Raised her pointer finger up once and was attempting to point behind her, however nothing further.  Patient and Family Stated Goals: None stated Date of Onset:  (unknown)  Pain Score:  Pt tolerated well without indication of pain. Pt premedicated.   Wound Assessment  Wound / Incision (Open or Dehisced) 04/09/20 Other (Comment) Coccyx Unstageable when assessed on 04/16/20 (Active)  Dressing Type ABD;Barrier Film (skin prep);Gauze (Comment);Moist to dry 04/27/20 1408  Dressing Changed Changed 04/27/20 1408  Dressing Status Clean;Dry;Intact 04/27/20 1408  Dressing Change Frequency Daily 04/27/20 1408  Site / Wound Assessment Bleeding;Pale;Pink;Yellow;Other (Comment) 04/27/20 1408  % Wound base Red or Granulating 55% 04/27/20 1408  % Wound base Yellow/Fibrinous Exudate 25% 04/27/20 1408  % Wound base Black/Eschar 0% 04/26/20 1451  % Wound base Other/Granulation Tissue (Comment) 20% 04/27/20 1408  Peri-wound Assessment Intact;Erythema (blanchable) 04/27/20 1408  Wound Length (cm) 8.4 cm 04/24/20 1100  Wound Width (cm) 9.1 cm 04/24/20 1100  Wound Depth (cm) 2.6 cm 04/24/20 1100  Wound Volume (cm^3) 198.74 cm^3 04/24/20 1100  Wound Surface Area (cm^2) 76.44 cm^2 04/24/20 1100  Undermining (cm) 0.5 -2.5 cm from 9 o'clock to 3 o' clock  04/25/20 1200  Margins Unattached edges (unapproximated) 04/27/20 1408  Closure None 04/27/20 1408  Drainage Amount None 04/27/20 1408  Drainage Description Serosanguineous;Odor 04/27/20 1408  Treatment Debridement (Selective);Hydrotherapy (Pulse lavage);Packing (Saline gauze) 04/27/20 1408   Santyl applied to wound bed prior to applying dressing.     Hydrotherapy Pulsed lavage therapy - wound location:  Sacrum Pulsed Lavage with Suction (psi): 12 psi Pulsed Lavage with Suction - Normal Saline Used: 1000 mL Pulsed Lavage Tip: Tip with splash shield Selective Debridement Selective Debridement - Location: Sacrum Selective Debridement - Tools Used: Forceps;Scalpel Selective Debridement - Tissue Removed: yellow necrotic tissue   Wound Assessment and Plan  Wound Therapy - Assess/Plan/Recommendations Wound Therapy - Clinical Statement: Progressing with debridement and wound bed healing. Pt had a foam dressing on upon arrival- recommend ABD as the foam was holding a lot of moisture against the wound bed and periwound area from drainage. This patient will benefit from continued hydrotherapy for selective removal of unviable tissue, to decrease bioburden and promote wound bed healing.  Wound Therapy - Functional Problem List: Decreased overall mobility  Factors Delaying/Impairing Wound Healing: Immobility;Multiple medical problems Hydrotherapy Plan: Debridement;Dressing change;Patient/family education;Pulsatile lavage with suction Wound Therapy - Frequency: 6X / week Wound Therapy - Follow Up Recommendations: Skilled nursing facility Wound Plan: See above  Wound Therapy Goals- Improve the function of patient's integumentary system by progressing the wound(s) through the phases of wound healing (inflammation - proliferation - remodeling) by: Decrease Necrotic Tissue to: 20% Decrease Necrotic Tissue - Progress: Progressing toward goal Increase Granulation Tissue to: 80% Increase Granulation Tissue - Progress: Progressing toward goal Goals/treatment plan/discharge plan were made with and agreed upon by patient/family: No, Patient unable to participate in goals/treatment/discharge plan and family unavailable Time For Goal Achievement: 7 days Wound Therapy - Potential for Goals: Fair  Goals will be updated until maximal potential achieved or discharge criteria met.  Discharge criteria: when goals  achieved, discharge from hospital, MD decision/surgical intervention, no progress towards goals, refusal/missing three consecutive treatments without notification or medical reason.  GP     Thelma Comp 04/27/2020, 2:23 PM  Rolinda Roan, PT, DPT Acute Rehabilitation Services Pager: [phone] Office: [phone]

## 2020-04-28 DIAGNOSIS — G060 Intracranial abscess and granuloma: Secondary | ICD-10-CM | POA: Diagnosis not present

## 2020-04-28 LAB — GLUCOSE, CAPILLARY
Glucose-Capillary: 98 mg/dL (ref 70–99)
Glucose-Capillary: 116 mg/dL — ABNORMAL HIGH (ref 70–99)
Glucose-Capillary: 131 mg/dL — ABNORMAL HIGH (ref 70–99)
Glucose-Capillary: 105 mg/dL — ABNORMAL HIGH (ref 70–99)
Glucose-Capillary: 98 mg/dL (ref 70–99)
Glucose-Capillary: 107 mg/dL — ABNORMAL HIGH (ref 70–99)

## 2020-04-28 NOTE — Progress Notes (Addendum)
PROGRESS NOTE  Vickie Alvarado  DOB: Nov 26, 1965  PCP: [facility] TAV:[id number]  DOA: 03/20/2020  LOS: 39 days   Chief complaint: Right-sided weakness, dysarthria, right facial droop  Brief narrative: Vickie Alvarado is a 54 y.o. year old female with medical history significant for HTN, HLD, COVID-19 infection (diagnosed 03/01/2020), dental infection and sinus infections.  Timeline of events:  8/25, patient presented with right-sided weakness, dysarthria and right facial droop also to have abscess in the left basal ganglia.  Patient was admitted to the hospital.  8/27, underwent stereotactic biopsy and aspiration of the abscess by neurosurgery and was transferred to ICU.  Wound culture resulted Streptococcus intermedius  8/27, MRI brain was obtained for worsening mental status.  It showed new large left PCA infarct along with scattered infarcts in the right basal ganglia and right thalamus concerning for either CVA related to septic emboli versus herniation syndrome. Neurosurgery believed patient developed the new development of the posterior cerebral artery distribution infarct due to to sequelae from herniation. Given her large hemisphere brain abscess with surrounding edema/cerebritis neurosurgery did not recommend decompressive surgery.  Patient was placed on a tapering course of Decadron which she completed by now.  8/28, PICC line placed  9/6, tracheostomy  9/9, PEG placement and tube feeding started  9/13, repeat CT head showed improvement in fluid collections blood showed persistent left parietal/occipital acute/subacute infarcts with improvement in midline shift  9/27-28 low grade (100.4) fever x2 - resolved - no acute indication for change in management - loose stool noted - laxatives stopped with resolution of soft BM  Patient's hospital stay is prolonged because of inability to find an appropriate SNF for her.  She has a tracheostomy, and PEG tube care  requirements.  Social worker was able to find a place in [place] which family refused to accept because 'it has bad reviews'  Subjective: No acute issues or events overnight, patient remains stable, poorly interactive on exam reflexively squeezes hand but does not follow any specific commands, will localize to follow motion with eyes but does not voluntarily open her eyelids.  Assessment/Plan: Principal Problem:   Brain abscess Active Problems:   Weakness of extremity   Essential hypertension   Hyperlipidemia   Anxiety   Bipolar disorder (HCC)   Cerebral embolism with cerebral infarction   Cerebral abscess   Cerebritis   Abdominal distension   Palliative care by specialist   Goals of care, counseling/discussion   Altered mental status   Adult failure to thrive   Acute traumatic/metabolic encephalopathy, POA, stable without resolution -Patient has poor but waxing and waning neurological status. She has moments of eye tracking and spontaneous movement of left extremities. However, she has a very poor overall neurological recovery. -Multifactorial: Brain abscess, brain tissue herniation, vasogenic edema, CVA -Unlikely to have meaningful recovery. However patient's family remains hopeful.  PT/OT/palliative care on board. 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 - Patient had two episodes of low-grade fever 9/27-28th  after pressure ulcer wound manipulation which has now resolved -On IV Rocephin for 8 weeks course as directed by ID to complete on 05/17/2020. -Completed a course of Decadron. -Continue Keppra 500 mg twice daily for seizure prophylaxis. -Continue to monitor on IV Rocephin.    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  Acute hypoxic respiratory failure -status post trach 04/01/2020 -stable 4-5 L of O2 on trach collar.  -PCCM following.  Trach tube downsized to 6 -0 cuffless. Continue 4-5 L oxygen by trach collar.  Nutrition - Continue Prosource via feeding tube  GERD, stable - Continue PPI per tube  Poorly controlled diabetes mellitus, POA, improving - A1c 11.5 on 8/27 - Blood sugar currently controlled on  Lantus 20 units daily and NovoLog 3 units every 4 hours while on tube feeds. - Continue Accu-Cheks. Recent Labs  Lab 04/27/20 1214 04/27/20 1711 04/27/20 1951 04/27/20 2327 04/28/20 0411  GLUCAP 105* 119* 123* 114* 98   Anemia of chronic disease - Hemoglobin stable.  Vitamin B12, folate and iron level adequate. Recent Labs    03/27/20 0443 03/28/20 0409 03/29/20 0515 03/31/20 1016 04/01/20 0620 04/04/20 1446 04/07/20 0435 04/13/20 0500 04/19/20 0200 04/24/20 0432  HGB 9.8* 10.6* 11.2* 11.6* 11.2* 11.6* 10.0* 10.6* 10.6* 10.9*   History of bipolar disorder, stable - Seroquel on hold.  Currently avoiding any sedative medication.  Pressure ulcer in gluteal cleft/sacral area,   - Consistent with stage 4 pressure ulcer.  Wound care following.  Patient is getting hydrotherapy, debridement packing.  Goals of care - Currently remains full code. Family remains hopeful despite low likelihood of meaningful recovery. Palliative care consult appreciated. Patient remains full code.  Code Status:   Code Status: Full Code  Nutritional status: Body mass index is 50.76 kg/m. Nutrition Problem: Increased nutrient needs Etiology: post-op healing Signs/Symptoms: estimated needs Diet Order            Diet NPO time specified  Diet effective midnight                 DVT prophylaxis: heparin injection 5,000 Units Start: 04/04/20 2200 SCDs Start: 03/20/20 1640   Antimicrobials:  IV Rocephin through 05/17/20 Fluid: tube feeding at 65 ml/h. Consultants: ID,  neurosurgery, neurology, palliative care Family Communication:  Discussed at length with daughter, discussed potential disposition, patient's ongoing status and likely prolonged course for improvement if at all possible, we discussed that patient may be at her new baseline but only time will tell.  Status is: Inpatient  Remains inpatient appropriate because: SNF not available yet  Dispo: The patient is from: Home              Anticipated d/c is to: SNF              Anticipated d/c date is: Pending SNF availability              Patient currently IS medically stable to d/c once safe disposition is secured.  Infusions:  . cefTRIAXone (ROCEPHIN)  IV 2 g (04/27/20 2134)  . dextrose 5% lactated ringers 30 mL/hr at 04/27/20 1438  . feeding supplement (JEVITY 1.5 CAL/FIBER) 1,000 mL (04/27/20 1620)    Scheduled Meds: . chlorhexidine gluconate (MEDLINE KIT)  15 mL Mouth Rinse BID  . Chlorhexidine Gluconate Cloth  6 each Topical Daily  . collagenase   Topical Daily  . docusate  100 mg Per Tube BID  . feeding supplement (PROSource TF)  45 mL Per Tube TID  . folic acid  1 mg Per Tube Daily  . heparin injection (subcutaneous)  5,000 Units Subcutaneous Q8H  . insulin aspart  0-20 Units Subcutaneous Q4H  . insulin aspart  3 Units Subcutaneous Q4H  . insulin glargine  20 Units Subcutaneous Daily  . levETIRAcetam  500 mg Per Tube BID  . mouth rinse  15 mL Mouth Rinse 10 times per day  . nutrition supplement (JUVEN)  1 packet Per Tube BID BM  . pantoprazole sodium  40 mg Per Tube QHS  . sodium chloride flush  10-40 mL Intracatheter Q12H  . sorbitol, milk of mag, mineral oil, glycerin (SMOG) enema  960 mL Rectal Once    Antimicrobials: Anti-infectives (From admission, onward)   Start     Dose/Rate Route Frequency Ordered Stop   04/14/20 1715  fluconazole (DIFLUCAN) tablet 150 mg        150 mg Per Tube  Once 04/14/20 1614 04/14/20 1813   04/04/20 1000  ceFAZolin (ANCEF) IVPB 2g/100 mL premix         2 g 200 mL/hr over 30 Minutes Intravenous To Radiology 04/04/20 0935 04/04/20 1053   04/03/20 0600  ceFAZolin (ANCEF) IVPB 1 g/50 mL premix        1 g 100 mL/hr over 30 Minutes Intravenous To Radiology 04/01/20 1239 04/04/20 0600   03/24/20 1030  vancomycin (VANCOREADY) IVPB 1500 mg/300 mL  Status:  Discontinued        1,500 mg 150 mL/hr over 120 Minutes Intravenous Every 8 hours 03/24/20 0952 03/24/20 1336   03/23/20 0130  vancomycin (VANCOCIN) IVPB 1000 mg/200 mL premix  Status:  Discontinued        1,000 mg 200 mL/hr over 60 Minutes Intravenous Every 8 hours 03/22/20 1647 03/24/20 0952   03/22/20 2200  cefTRIAXone (ROCEPHIN) 2 g in sodium chloride 0.9 % 100 mL IVPB        2 g 200 mL/hr over 30 Minutes Intravenous Every 12 hours 03/22/20 1617     03/22/20 1730  vancomycin (VANCOREADY) IVPB 1750 mg/350 mL        1,750 mg 175 mL/hr over 120 Minutes Intravenous  Once 03/22/20 1647 03/22/20 2030   03/22/20 1630  metroNIDAZOLE (FLAGYL) IVPB 500 mg  Status:  Discontinued        500 mg 100 mL/hr over 60 Minutes Intravenous Every 8 hours 03/22/20 1618 03/24/20 1350   03/22/20 1336  bacitracin 50,000 Units in sodium chloride 0.9 % 500 mL irrigation  Status:  Discontinued          As needed 03/22/20 1336 03/22/20 1341   03/22/20 1315  vancomycin (VANCOCIN) IVPB 1000 mg/200 mL premix  Status:  Discontinued        1,000 mg 200 mL/hr over 60 Minutes Intravenous To Surgery 03/22/20 1313 03/22/20 1545   03/22/20 1315  ceFEPIme (MAXIPIME) 2 g in sodium chloride 0.9 % 100 mL IVPB        2 g 200 mL/hr over 30 Minutes Intravenous To Surgery 03/22/20 1313 03/22/20 1354   03/22/20 1145  ceFAZolin (ANCEF) IVPB 2g/100 mL premix  Status:  Discontinued        2 g 200 mL/hr over 30 Minutes Intravenous  Once 03/22/20 1131 03/22/20 1545   03/22/20 1138  ceFAZolin (ANCEF) 2-4 GM/100ML-% IVPB       Note to Pharmacy: Gleason, Ginger   : cabinet override      03/22/20 1138 03/22/20 2344      PRN  meds: acetaminophen (TYLENOL) oral liquid 160 mg/5 mL, acetaminophen (TYLENOL) oral liquid 160 mg/5 mL **OR** acetaminophen, hydrALAZINE, labetalol, ondansetron (ZOFRAN) IV, oxyCODONE-acetaminophen, polyethylene glycol, promethazine, sodium chloride flush   Objective: Vitals:   04/27/20 2314 04/28/20 0122  BP: (!) 144/86 (!) 144/86  Pulse: 88 74  Resp: (!)  21 (!) 21  Temp: 99.4 F (37.4 C)   SpO2: 96% 96%    Intake/Output Summary (Last 24 hours) at 04/28/2020 0737 Last data filed at 04/28/2020 0630 Gross per 24 hour  Intake --  Output 2850 ml  Net -2850 ml   Filed Weights   04/24/20 2210 04/25/20 0107 04/26/20 0631  Weight: (!) 0.03 kg (!) 0.003 kg (!) 147 kg   Weight change:  Body mass index is 50.76 kg/m.   Physical Exam: General: Resting comfortably in bed, minimally interactive, does not follow commands, minimal reaction to sternal rub or nailbed pressure. HEENT: Tracks movements if eyelids propped open but will not open eyelids to stimuli.   Neck:  Without mass or deformity. Tracheostomy clean dry intact, minimal sputum Lungs:  Clear to auscultate bilaterally without rhonchi, wheeze, or rales. Heart:  Regular rate and rhythm.  Without murmurs, rubs, or gallops. Abdomen: PEG tube bandage clean dry intact soft, nontender, nondistended.  Without guarding or rebound.  Foley catheter in place. Extremities: Without cyanosis, clubbing, edema, or obvious deformity. Vascular:  Dorsalis pedis and posterior tibial pulses palpable bilaterally.  Right proximal arm double-lumen PICC bandage clean dry intact Skin:  Warm and dry, no erythema, stage 4 coccygeal ulcer (defer to wound care notes for further description)   Data Review: I have personally reviewed the laboratory data and studies available.  Recent Labs  Lab 04/24/20 0432  WBC 8.3  NEUTROABS 5.1  HGB 10.9*  HCT 34.4*  MCV 91.7  PLT 394   Recent Labs  Lab 04/24/20 0432  NA 139  K 3.5  CL 104  CO2 26  GLUCOSE  108*  BUN 11  CREATININE 0.39*  CALCIUM 8.5*   Signed, Little Ishikawa, DO  [facility]   04/28/2020

## 2020-04-29 DIAGNOSIS — G060 Intracranial abscess and granuloma: Secondary | ICD-10-CM | POA: Diagnosis not present

## 2020-04-29 LAB — CBC
HCT: 33.3 % — ABNORMAL LOW (ref 36.0–46.0)
Hemoglobin: 10.7 g/dL — ABNORMAL LOW (ref 12.0–15.0)
MCH: 28.9 pg (ref 26.0–34.0)
MCHC: 32.1 g/dL (ref 30.0–36.0)
MCV: 90 fL (ref 80.0–100.0)
Platelets: 315 10*3/uL (ref 150–400)
RBC: 3.7 MIL/uL — ABNORMAL LOW (ref 3.87–5.11)
RDW: 15.6 % — ABNORMAL HIGH (ref 11.5–15.5)
WBC: 9.1 10*3/uL (ref 4.0–10.5)
nRBC: 0 % (ref 0.0–0.2)

## 2020-04-29 LAB — COMPREHENSIVE METABOLIC PANEL
ALT: 40 U/L (ref 0–44)
AST: 20 U/L (ref 15–41)
Albumin: 2.3 g/dL — ABNORMAL LOW (ref 3.5–5.0)
Alkaline Phosphatase: 114 U/L (ref 38–126)
Anion gap: 8 (ref 5–15)
BUN: 10 mg/dL (ref 6–20)
CO2: 29 mmol/L (ref 22–32)
Calcium: 8.4 mg/dL — ABNORMAL LOW (ref 8.9–10.3)
Chloride: 102 mmol/L (ref 98–111)
Creatinine, Ser: 0.38 mg/dL — ABNORMAL LOW (ref 0.44–1.00)
GFR calc Af Amer: >60 mL/min (ref >60)
GFR calc non Af Amer: >60 mL/min (ref >60)
Glucose, Bld: 130 mg/dL — ABNORMAL HIGH (ref 70–99)
Potassium: 3.2 mmol/L — ABNORMAL LOW (ref 3.5–5.1)
Sodium: 139 mmol/L (ref 135–145)
Total Bilirubin: 0.4 mg/dL (ref 0.3–1.2)
Total Protein: 5.1 g/dL — ABNORMAL LOW (ref 6.5–8.1)

## 2020-04-29 LAB — GLUCOSE, CAPILLARY
Glucose-Capillary: 76 mg/dL (ref 70–99)
Glucose-Capillary: 117 mg/dL — ABNORMAL HIGH (ref 70–99)
Glucose-Capillary: 99 mg/dL (ref 70–99)
Glucose-Capillary: 101 mg/dL — ABNORMAL HIGH (ref 70–99)
Glucose-Capillary: 100 mg/dL — ABNORMAL HIGH (ref 70–99)
Glucose-Capillary: 112 mg/dL — ABNORMAL HIGH (ref 70–99)

## 2020-04-29 NOTE — Progress Notes (Signed)
Physical Therapy Wound Treatment Patient Details  Name: Vickie Alvarado MRN: [id number] Date of Birth: 20-Mar-1966  Today's Date: 04/29/2020 Time: 0911-0950 Time Calculation (min): 39 min  Subjective  Subjective: Pt not communicating verbally. Smiled when I introduced myself Patient and Family Stated Goals: None stated Date of Onset:  (unknown)  Pain Score:    Wound Assessment  Wound / Incision (Open or Dehisced) 04/09/20 Other (Comment) Coccyx Unstageable when assessed on 04/16/20 (Active)  Wound Image  04/24/20 1100  Dressing Type ABD;Barrier Film (skin prep);Gauze (Comment);Normal saline moist dressing;Tape dressing;Other (Comment) 04/29/20 0955  Dressing Changed Changed 04/29/20 0955  Dressing Status Clean;Dry;Intact 04/29/20 0955  Dressing Change Frequency Daily 04/29/20 0955  Site / Wound Assessment Bleeding;Granulation tissue;Red;Yellow;Other (Comment) 04/29/20 0955  % Wound base Red or Granulating 60% 04/29/20 0955  % Wound base Yellow/Fibrinous Exudate 20% 04/29/20 0955  % Wound base Black/Eschar 0% 04/26/20 1451  % Wound base Other/Granulation Tissue (Comment) 20% 04/29/20 0955  Peri-wound Assessment Intact 04/29/20 0955  Wound Length (cm) 8.4 cm 04/24/20 1100  Wound Width (cm) 9.1 cm 04/24/20 1100  Wound Depth (cm) 2.6 cm 04/24/20 1100  Wound Volume (cm^3) 198.74 cm^3 04/24/20 1100  Wound Surface Area (cm^2) 76.44 cm^2 04/24/20 1100  Undermining (cm) 0.5 -2.5 cm from 9 o'clock to 3 o' clock  04/25/20 1200  Margins Unattached edges (unapproximated) 04/29/20 0955  Closure None 04/29/20 0955  Drainage Amount Minimal 04/29/20 0955  Drainage Description Serosanguineous 04/29/20 0955  Non-staged Wound Description Full thickness 04/29/20 0955  Treatment Cleansed;Debridement (Selective);Hydrotherapy (Pulse lavage);Packing (Saline gauze);Tape changed 04/29/20 0955      Hydrotherapy Pulsed lavage therapy - wound location: Sacrum Pulsed Lavage with Suction (psi): 4 psi (up to  8 psi; easily bleeds) Pulsed Lavage with Suction - Normal Saline Used: 1000 mL Pulsed Lavage Tip: Tip with splash shield Selective Debridement Selective Debridement - Location: Sacrum Selective Debridement - Tools Used: Forceps;Scalpel Selective Debridement - Tissue Removed: yellow necrotic tissue   Wound Assessment and Plan  Wound Therapy - Assess/Plan/Recommendations Wound Therapy - Clinical Statement: Wound continues to improve. Majority of wound bed is either red, granulating tissue or while connective tissue overlying sacrum/coccyx. The remaining yellow necrotic tissue intermixed with red healing tissue. Anticipate nursing should be able to soon take over dressing changes and d/c hydrotherapy. Wound Therapy - Functional Problem List: Decreased overall mobility  Factors Delaying/Impairing Wound Healing: Immobility;Multiple medical problems Hydrotherapy Plan: Debridement;Dressing change;Patient/family education;Pulsatile lavage with suction Wound Therapy - Frequency: 6X / week Wound Therapy - Follow Up Recommendations: Skilled nursing facility Wound Plan: See above  Wound Therapy Goals- Improve the function of patient's integumentary system by progressing the wound(s) through the phases of wound healing (inflammation - proliferation - remodeling) by: Decrease Necrotic Tissue to: 20% Decrease Necrotic Tissue - Progress: Met Increase Granulation Tissue to: 80% Increase Granulation Tissue - Progress: Progressing toward goal Goals/treatment plan/discharge plan were made with and agreed upon by patient/family: No, Patient unable to participate in goals/treatment/discharge plan and family unavailable Time For Goal Achievement: 7 days Wound Therapy - Potential for Goals: Fair  Goals will be updated until maximal potential achieved or discharge criteria met.  Discharge criteria: when goals achieved, discharge from hospital, MD decision/surgical intervention, no progress towards goals,  refusal/missing three consecutive treatments without notification or medical reason.  GP     Arby Barrette, PT Pager [phone]   Jeanie Cooks Tracey Stewart 04/29/2020, 11:58 AM

## 2020-04-29 NOTE — Progress Notes (Signed)
Physical Therapy Treatment Patient Details Name: Vickie Alvarado MRN: [id number] DOB: 01/07/1966 Today's Date: 04/29/2020    History of Present Illness 54 y/o F with a history of HTN, HLD, COVID-19 infection (03/01/20), dental infections, and sinus infections presented to [facility] after being found altered with R sided weakness, dysarthria, and R facial droop. Patient had decreased  P.O. intake, decreased appetite, and projectile vomiting in addition to her other symptoms. 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 Neurosurgery performed a L frontal stereotactic biopsy/aspiration of the abscess. 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 ETT 8/28, trach 9/6.    PT Comments    Pt more interactive with PT today, squeezing PT hands bilaterally, raising UEs, smiling and nodding yes/no all when cued by PT. Pt demonstrates partial ROM actively in bilateral UEs, LE movement remains contraction-only if any. Pt also attending to R when prompted and when given increased time, pt aided by having friend sit to her R during session. Pt demonstrating progression with PT, will continue to follow acutely.    Follow Up Recommendations  SNF;LTACH     Equipment Recommendations  None recommended by PT    Recommendations for Other Services       Precautions / Restrictions Precautions Precautions: Fall Precaution Comments: trach; peg Required Braces or Orthoses: Other Brace (B prafo) Restrictions Weight Bearing Restrictions: No    Mobility  Bed Mobility Overal bed mobility: Needs Assistance Bed Mobility: Rolling;Supine to Sit Rolling: Max assist;+2 for physical assistance   Supine to sit: Total assist;+2 for physical assistance     General bed mobility comments: max assist +2 for rolling bilaterally during pericare for trunk and LE management, pt  using UEs to initate roll. total +2 for boost up in bed with bed pads.  Transfers                 General transfer comment: deferred  Ambulation/Gait             General Gait Details: deferred   Stairs             Wheelchair Mobility    Modified Rankin (Stroke Patients Only) Modified Rankin (Stroke Patients Only) Pre-Morbid Rankin Score: No symptoms Modified Rankin: Severe disability     Balance Overall balance assessment: Needs assistance   Sitting balance-Leahy Scale: Zero Sitting balance - Comments: attempted to clear back off of bed with total assist, unable to perform +1                                    Cognition Arousal/Alertness: Awake/alert Behavior During Therapy: Flat affect Overall Cognitive Status: Difficult to assess Area of Impairment: Following commands;Attention;Awareness                   Current Attention Level: Focused   Following Commands: Follows one step commands with increased time;Follows one step commands consistently   Awareness: Intellectual Problem Solving: Slow processing;Requires verbal cues;Requires tactile cues;Decreased initiation;Difficulty sequencing General Comments: Pt interactive with eye contact and facial expressions, responds yes/no with head movement. 100% follows commands today, for example raise your arm, kick your leg, squeeze my hand. Pt smiles/laughs appropriately      Exercises General Exercises - Upper Extremity Shoulder Flexion: PROM;Both;10 reps;Seated (egress  position) Elbow Extension: PROM;Right;10 reps;Seated General Exercises - Lower Extremity Ankle Circles/Pumps: PROM;Both;Supine;5 reps (with sustained hold until 0* DF reached) Short Arc Quad: PROM;Both;10 reps;Supine (to maintain full ROM knee extension; quad tapping to facilitate quad activation) Heel Slides: PROM;Both;Supine;10 reps Hip ABduction/ADduction: PROM;Both;Supine;10 reps    General Comments         Pertinent Vitals/Pain Pain Assessment: Faces Faces Pain Scale: Hurts little more Pain Location: LEs with PROM in hip abd/add, DF Pain Descriptors / Indicators: Guarding;Grimacing Pain Intervention(s): Limited activity within patient's tolerance;Monitored during session;Repositioned    Home Living                      Prior Function            PT Goals (current goals can now be found in the care plan section) Acute Rehab PT Goals Patient Stated Goal: unable PT Goal Formulation: Patient unable to participate in goal setting Time For Goal Achievement: 05/08/20 Potential to Achieve Goals: Fair Progress towards PT goals: Progressing toward goals    Frequency    Min 2X/week      PT Plan Current plan remains appropriate    Co-evaluation              AM-PAC PT "6 Clicks" Mobility   Outcome Measure  Help needed turning from your back to your side while in a flat bed without using bedrails?: Total Help needed moving from lying on your back to sitting on the side of a flat bed without using bedrails?: Total Help needed moving to and from a bed to a chair (including a wheelchair)?: Total Help needed standing up from a chair using your arms (e.g., wheelchair or bedside chair)?: Total Help needed to walk in hospital room?: Total Help needed climbing 3-5 steps with a railing? : Total 6 Click Score: 6    End of Session Equipment Utilized During Treatment: Oxygen Activity Tolerance: Patient tolerated treatment well Patient left: in bed;with bed alarm set;with call bell/phone within reach;with SCD's reapplied;Other (comment) (with PRAFOs donned) Nurse Communication: Mobility status PT Visit Diagnosis: Muscle weakness (generalized) (M62.81);Hemiplegia and hemiparesis Hemiplegia - Right/Left: Right Hemiplegia - dominant/non-dominant: Dominant Hemiplegia - caused by: Other cerebrovascular disease     Time: 1216-1250 PT Time Calculation (min) (ACUTE ONLY): 34  min  Charges:  $Therapeutic Exercise: 8-22 mins $Therapeutic Activity: 8-22 mins                    Vickie Alvarado E, PT Acute Rehabilitation Services Pager [phone]  Office [phone]    Vickie Alvarado D Firas Guardado 04/29/2020, 1:31 PM

## 2020-04-29 NOTE — Progress Notes (Addendum)
PROGRESS NOTE  Vickie Alvarado  DOB: April 07, 1966  PCP: [facility] KNL:[id number]  DOA: 03/20/2020  LOS: 40 days   Chief complaint: Right-sided weakness, dysarthria, right facial droop  Brief narrative: Vickie Alvarado is a 54 y.o. year old female with medical history significant for HTN, HLD, COVID-19 infection (diagnosed 03/01/2020), dental infection and sinus infections.  Timeline of events:  8/25, patient presented with right-sided weakness, dysarthria and right facial droop also to have abscess in the left basal ganglia.  Patient was admitted to the hospital.  8/27, underwent stereotactic biopsy and aspiration of the abscess by neurosurgery and was transferred to ICU.  Wound culture resulted Streptococcus intermedius  8/27, MRI brain was obtained for worsening mental status.  It showed new large left PCA infarct along with scattered infarcts in the right basal ganglia and right thalamus concerning for either CVA related to septic emboli versus herniation syndrome. Neurosurgery believed patient developed the new development of the posterior cerebral artery distribution infarct due to to sequelae from herniation. Given her large hemisphere brain abscess with surrounding edema/cerebritis neurosurgery did not recommend decompressive surgery.  Patient was placed on a tapering course of Decadron which she completed by now.  8/28, PICC line placed  9/6, tracheostomy  9/9, PEG placement and tube feeding started  9/13, repeat CT head showed improvement in fluid collections blood showed persistent left parietal/occipital acute/subacute infarcts with improvement in midline shift  9/27-28 low grade (100.4) fever x2 - resolved - no acute indication for change in management - loose stool noted - laxatives stopped with resolution of soft BM  Patient's hospital stay is prolonged because of inability to find an appropriate SNF for her.  She has a tracheostomy, and PEG tube care  requirements.  Social worker was able to find a place in [place] which family refused to accept because 'it has bad reviews' - continuing to work on disposition.  Subjective: No acute issues or events overnight, patient remains stable, poorly interactive on exam reflexively squeezes hand but does not follow any specific commands, will localize to follow motion with eyes but does not voluntarily open her eyelids.  Assessment/Plan: Principal Problem:   Brain abscess Active Problems:   Weakness of extremity   Essential hypertension   Hyperlipidemia   Anxiety   Bipolar disorder (HCC)   Cerebral embolism with cerebral infarction   Cerebral abscess   Cerebritis   Abdominal distension   Palliative care by specialist   Goals of care, counseling/discussion   Altered mental status   Adult failure to thrive   Acute traumatic/metabolic encephalopathy, POA, stable without resolution - Patient has poor but waxing and waning neurological status. She has moments of eye tracking and spontaneous movement of left extremities. However, she has a very poor overall neurological recovery. - Multifactorial: Brain abscess, brain tissue herniation, vasogenic edema, CVA - Unlikely to have meaningful recovery. However patient's family remains hopeful.  PT/OT/palliative care on board. 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 -  Patient had two episodes of low-grade fever 9/27-28th  after pressure ulcer wound manipulation which has now resolved - On IV Rocephin for 8 weeks course as directed by ID to complete on 05/17/2020. - Completed a course of Decadron. - Continue Keppra 500 mg twice daily for seizure prophylaxis. - Continue to monitor on IV Rocephin.    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  Acute hypoxic respiratory failure - Status post trach 04/01/2020 - Stable 4-5 L of O2 on trach collar.  - PCCM following.  Trach tube downsized to 6 -0 cuffless. Continue 4-5 L oxygen by trach collar.  Nutrition - Continue Prosource via feeding tube  GERD, stable - Continue PPI per tube  Poorly controlled diabetes mellitus, POA, improving - A1c 11.5 on 8/27 - Blood sugar currently controlled on Lantus 20 units daily and NovoLog 3 units every 4 hours while on tube feeds - Continue Accu-Cheks Recent Labs  Lab 04/28/20 1156 04/28/20 1736 04/28/20 1919 04/28/20 2310 04/29/20 0303  GLUCAP 131* 105* 98 107* 76   Anemia of chronic disease - Hemoglobin stable. Vitamin B12, folate and iron level adequate. Recent Labs    03/28/20 0409 03/29/20 0515 03/31/20 1016 04/01/20 0620 04/04/20 1446 04/07/20 0435 04/13/20 0500 04/19/20 0200 04/24/20 0432 04/29/20 0552  HGB 10.6* 11.2* 11.6* 11.2* 11.6* 10.0* 10.6* 10.6* 10.9* 10.7*   History of bipolar disorder, stable - Seroquel on hold. Currently avoiding any sedative medication.  Pressure ulcer in gluteal cleft/sacral area,   - Consistent with stage 4 pressure ulcer. Wound care following. Patient is getting hydrotherapy, debridement packing.  Goals of care - Currently remains full code. Family remains hopeful despite low likelihood of meaningful recovery. Palliative care consult appreciated. Patient remains full code.  Code Status:   Code Status: Full Code Nutritional status: Body mass index is 0.01 kg/m. Nutrition Problem: Increased nutrient needs Etiology: post-op healing Signs/Symptoms: estimated needs Diet Order            Diet NPO time specified  Diet effective midnight                 DVT prophylaxis: heparin injection 5,000 Units Start: 04/04/20 2200 SCDs Start: 03/20/20 1640   Antimicrobials:  IV Rocephin through 05/17/20 Fluid:  Tube Feeding at 65 ml/h Consultants: ID, neurosurgery, neurology, palliative care Family Communication:  Discussed at length with daughter, discussed potential disposition, patient's ongoing status and likely prolonged course for improvement if at all possible, we discussed that patient may be at her new baseline but only time will tell.  Status is: Inpatient  Remains inpatient appropriate because: SNF not available yet  Dispo: The patient is from: Home              Anticipated d/c is to: SNF              Anticipated d/c date is: Pending SNF availability              Patient currently IS medically stable to d/c once safe disposition is secured.  Infusions:  . cefTRIAXone (ROCEPHIN)  IV Stopped (04/29/20 0727)  . dextrose 5% lactated ringers 30 mL/hr at 04/28/20 2100  . feeding supplement (JEVITY 1.5 CAL/FIBER) 1,000 mL (04/28/20 1325)    Scheduled Meds: . chlorhexidine gluconate (MEDLINE KIT)  15 mL Mouth Rinse BID  . Chlorhexidine Gluconate Cloth  6 each Topical Daily  . collagenase   Topical Daily  . docusate  100 mg Per Tube BID  . feeding supplement (PROSource TF)  45 mL Per Tube TID  . folic acid  1 mg Per Tube Daily  . heparin injection (subcutaneous)  5,000 Units Subcutaneous Q8H  . insulin aspart  0-20 Units Subcutaneous Q4H  . insulin aspart  3 Units Subcutaneous Q4H  .  insulin glargine  20 Units Subcutaneous Daily  . levETIRAcetam  500 mg Per Tube BID  . mouth rinse  15 mL Mouth Rinse 10 times per day  . nutrition supplement (JUVEN)  1 packet Per Tube BID BM  . pantoprazole sodium  40 mg Per Tube QHS  . sodium chloride flush  10-40 mL Intracatheter Q12H  . sorbitol, milk of mag, mineral oil, glycerin (SMOG) enema  960 mL Rectal Once    Antimicrobials: Anti-infectives (From admission, onward)   Start     Dose/Rate Route Frequency Ordered Stop   04/14/20 1715  fluconazole (DIFLUCAN) tablet 150 mg        150 mg Per Tube  Once 04/14/20 1614 04/14/20 1813   04/04/20  1000  ceFAZolin (ANCEF) IVPB 2g/100 mL premix        2 g 200 mL/hr over 30 Minutes Intravenous To Radiology 04/04/20 0935 04/04/20 1053   04/03/20 0600  ceFAZolin (ANCEF) IVPB 1 g/50 mL premix        1 g 100 mL/hr over 30 Minutes Intravenous To Radiology 04/01/20 1239 04/04/20 0600   03/24/20 1030  vancomycin (VANCOREADY) IVPB 1500 mg/300 mL  Status:  Discontinued        1,500 mg 150 mL/hr over 120 Minutes Intravenous Every 8 hours 03/24/20 0952 03/24/20 1336   03/23/20 0130  vancomycin (VANCOCIN) IVPB 1000 mg/200 mL premix  Status:  Discontinued        1,000 mg 200 mL/hr over 60 Minutes Intravenous Every 8 hours 03/22/20 1647 03/24/20 0952   03/22/20 2200  cefTRIAXone (ROCEPHIN) 2 g in sodium chloride 0.9 % 100 mL IVPB        2 g 200 mL/hr over 30 Minutes Intravenous Every 12 hours 03/22/20 1617     03/22/20 1730  vancomycin (VANCOREADY) IVPB 1750 mg/350 mL        1,750 mg 175 mL/hr over 120 Minutes Intravenous  Once 03/22/20 1647 03/22/20 2030   03/22/20 1630  metroNIDAZOLE (FLAGYL) IVPB 500 mg  Status:  Discontinued        500 mg 100 mL/hr over 60 Minutes Intravenous Every 8 hours 03/22/20 1618 03/24/20 1350   03/22/20 1336  bacitracin 50,000 Units in sodium chloride 0.9 % 500 mL irrigation  Status:  Discontinued          As needed 03/22/20 1336 03/22/20 1341   03/22/20 1315  vancomycin (VANCOCIN) IVPB 1000 mg/200 mL premix  Status:  Discontinued        1,000 mg 200 mL/hr over 60 Minutes Intravenous To Surgery 03/22/20 1313 03/22/20 1545   03/22/20 1315  ceFEPIme (MAXIPIME) 2 g in sodium chloride 0.9 % 100 mL IVPB        2 g 200 mL/hr over 30 Minutes Intravenous To Surgery 03/22/20 1313 03/22/20 1354   03/22/20 1145  ceFAZolin (ANCEF) IVPB 2g/100 mL premix  Status:  Discontinued        2 g 200 mL/hr over 30 Minutes Intravenous  Once 03/22/20 1131 03/22/20 1545   03/22/20 1138  ceFAZolin (ANCEF) 2-4 GM/100ML-% IVPB       Note to Pharmacy: Gleason, Ginger   : cabinet override       03/22/20 1138 03/22/20 2344      PRN meds: acetaminophen (TYLENOL) oral liquid 160 mg/5 mL, acetaminophen (TYLENOL) oral liquid 160 mg/5 mL **OR** acetaminophen, hydrALAZINE, labetalol, ondansetron (ZOFRAN) IV, oxyCODONE-acetaminophen, polyethylene glycol, promethazine, sodium chloride flush   Objective: Vitals:   04/29/20 0259 04/29/20 0426  BP: (!) 155/85   Pulse: 97 81  Resp: 20 17  Temp: 98.6 F (37 C)   SpO2: 100% 95%    Intake/Output Summary (Last 24 hours) at 04/29/2020 0746 Last data filed at 04/29/2020 0300 Gross per 24 hour  Intake --  Output 2050 ml  Net -2050 ml   Filed Weights   04/26/20 0631 04/29/20 0005 04/29/20 0231  Weight: (!) 147 kg (!) 0.03 kg (!) 0.03 kg   Weight change:  Body mass index is 0.01 kg/m.   Physical Exam: General: Resting comfortably in bed, minimally interactive, does not follow commands, minimal reaction to sternal rub or nailbed pressure. HEENT: Tracks movements if eyelids propped open but will not open eyelids to stimuli.   Neck:  Without mass or deformity. Tracheostomy clean dry intact, minimal sputum Lungs:  Clear to auscultate bilaterally without rhonchi, wheeze, or rales. Heart:  Regular rate and rhythm.  Without murmurs, rubs, or gallops. Abdomen: PEG tube bandage clean dry intact soft, nontender, nondistended.  Without guarding or rebound.  Foley catheter in place. Extremities: Without cyanosis, clubbing, edema, or obvious deformity. Vascular:  Dorsalis pedis and posterior tibial pulses palpable bilaterally.  Right proximal arm double-lumen PICC bandage clean dry intact Skin:  Warm and dry, no erythema, stage 4 coccygeal ulcer (defer to wound care notes for further description)   Data Review: I have personally reviewed the laboratory data and studies available.  Recent Labs  Lab 04/24/20 0432 04/29/20 0552  WBC 8.3 9.1  NEUTROABS 5.1  --   HGB 10.9* 10.7*  HCT 34.4* 33.3*  MCV 91.7 90.0  PLT 394 315   Recent Labs   Lab 04/24/20 0432 04/29/20 0552  NA 139 139  K 3.5 3.2*  CL 104 102  CO2 26 29  GLUCOSE 108* 130*  BUN 11 10  CREATININE 0.39* 0.38*  CALCIUM 8.5* 8.4*   Signed, Little Ishikawa, DO  [facility] 04/29/2020

## 2020-04-29 NOTE — TOC Progression Note (Signed)
Transition of Care [facility]) - Progression Note    Patient Details  Name: Analya Louissaint MRN: [id number] Date of Birth: 18-Aug-1965  Transition of Care [facility]) CM/SW Contact  Beckie Busing, RN Phone Number: [phone]  04/29/2020, 10:28 AM  Clinical Narrative:    Bed search expanded. Offers pending   Expected Discharge Plan: Skilled Nursing Facility Barriers to Discharge: Continued Medical Work up  Expected Discharge Plan and Services Expected Discharge Plan: Skilled Nursing Facility In-house Referral: NA Discharge Planning Services: CM Consult   Living arrangements for the past 2 months: Single Family Home                 DME Arranged: N/A DME Agency: NA       HH Arranged: NA HH Agency: NA         Social Determinants of Health (SDOH) Interventions    Readmission Risk Interventions No flowsheet data found.

## 2020-04-30 DIAGNOSIS — G060 Intracranial abscess and granuloma: Secondary | ICD-10-CM | POA: Diagnosis not present

## 2020-04-30 LAB — SARS CORONAVIRUS 2 BY RT PCR (HOSPITAL ORDER, PERFORMED IN ~~LOC~~ HOSPITAL LAB): SARS Coronavirus 2: NEGATIVE

## 2020-04-30 LAB — GLUCOSE, CAPILLARY
Glucose-Capillary: 107 mg/dL — ABNORMAL HIGH (ref 70–99)
Glucose-Capillary: 112 mg/dL — ABNORMAL HIGH (ref 70–99)
Glucose-Capillary: 104 mg/dL — ABNORMAL HIGH (ref 70–99)
Glucose-Capillary: 137 mg/dL — ABNORMAL HIGH (ref 70–99)
Glucose-Capillary: 122 mg/dL — ABNORMAL HIGH (ref 70–99)
Glucose-Capillary: 115 mg/dL — ABNORMAL HIGH (ref 70–99)

## 2020-04-30 NOTE — TOC Progression Note (Signed)
Transition of Care [facility]) - Progression Note    Patient Details  Name: Vickie Alvarado MRN: [id number] Date of Birth: 05-Mar-1966  Transition [facility]) CM/SW Contact  Beckie Busing, RN Phone Number: [phone]  04/30/2020, 3:12 PM  Clinical Narrative: CM notified to make aware that today 10/5 is the last day of hydrotherapy. Cm contacted [facility] to determine if facility is still willing to make a bed offer. [facility] to initiate insurance authorization and has requested that patient have covid test done. MD has been notified of request for covid test. CM called daughter Lanier Ensign to make her aware that hydrotherapy is complete and to make aware that bed search is ongoing. Daughter states that she is not opposed to [facility], she just does not like the online ratings and she wants the opportunity to tour the facility. Daughter states that she has been calling the facility about a tour but no one has returned her calls. CM updated daughter to inform her that the facility is not allowing in person tours but virtual tours instead. Daughter states that she will follow up. Cm will continue to follow.     Expected Discharge Plan: Skilled Nursing Facility Barriers to Discharge: Continued Medical Work up  Expected Discharge Plan and Services Expected Discharge Plan: Skilled Nursing Facility In-house Referral: NA Discharge Planning Services: CM Consult   Living arrangements for the past 2 months: Single Family Home                 DME Arranged: N/A DME Agency: NA       HH Arranged: NA HH Agency: NA         Social Determinants of Health (SDOH) Interventions    Readmission Risk Interventions No flowsheet data found.

## 2020-04-30 NOTE — Progress Notes (Addendum)
PROGRESS NOTE  Vickie Alvarado  DOB: Jul 24, 1966  PCP: [facility] BVA:[id number]  DOA: 03/20/2020  LOS: 41 days   Chief complaint: Right-sided weakness, dysarthria, right facial droop  Brief narrative: Vickie Alvarado is a 54 y.o. year old female with medical history significant for HTN, HLD, COVID-19 infection (diagnosed 03/01/2020), dental infection and sinus infections.  Timeline of events:  8/25, patient presented with right-sided weakness, dysarthria and right facial droop also to have abscess in the left basal ganglia.  Patient was admitted to the hospital.  8/27, underwent stereotactic biopsy and aspiration of the abscess by neurosurgery and was transferred to ICU.  Wound culture resulted Streptococcus intermedius  8/27, MRI brain was obtained for worsening mental status.  It showed new large left PCA infarct along with scattered infarcts in the right basal ganglia and right thalamus concerning for either CVA related to septic emboli versus herniation syndrome. Neurosurgery believed patient developed the new development of the posterior cerebral artery distribution infarct due to to sequelae from herniation. Given her large hemisphere brain abscess with surrounding edema/cerebritis neurosurgery did not recommend decompressive surgery.  Patient was placed on a tapering course of Decadron which she completed by now.  8/28, PICC line placed  9/6, tracheostomy  9/9, PEG placement and tube feeding started  9/13, repeat CT head showed improvement in fluid collections blood showed persistent left parietal/occipital acute/subacute infarcts with improvement in midline shift  9/27-28 low grade (100.4) fever x2 - resolved - no acute indication for change in management - loose stool noted - laxatives stopped with resolution of soft BM  Patient's hospital stay is prolonged because of inability to find an appropriate SNF for her, she was previously requiring hydrotherapy  wound care which has now been decreased which may allow for easier discharge.  She has a tracheostomy, and PEG tube care requirements.  Subjective: No acute issues or events overnight, patient remains stable, moderately more interactive on exam today able to follow simple commands like squeezing eyes shut, nodding head yes or no.  Assessment/Plan: Principal Problem:   Brain abscess Active Problems:   Weakness of extremity   Essential hypertension   Hyperlipidemia   Anxiety   Bipolar disorder (HCC)   Cerebral embolism with cerebral infarction   Cerebral abscess   Cerebritis   Abdominal distension   Palliative care by specialist   Goals of care, counseling/discussion   Altered mental status   Adult failure to thrive   Acute traumatic/metabolic encephalopathy, POA, minimal resolution - Patient has poor but waxing and waning neurological status.  Today patient was able to follow simple commands "nod head yes or no, squeeze eyes shut", communication still severely limited but this appears to be some mild improvement - Multifactorial: Brain abscess, brain tissue herniation, vasogenic edema, CVA - Unlikely to have meaningful recovery. However patient's family remains hopeful.  PT/OT/palliative care on board. 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 -  Patient had two episodes of low-grade fever 9/27-28th  after pressure ulcer wound manipulation which has now resolved - On IV Rocephin for 8 weeks course as directed by ID to complete on 05/17/2020. - Completed a course of Decadron. - Continue Keppra 500 mg twice daily for seizure prophylaxis. - Continue to monitor on IV Rocephin.    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  Acute hypoxic respiratory failure - Status post trach 04/01/2020 - Stable 4-5 L of O2 on trach collar.  - PCCM following.  Trach tube downsized to 6 -0 cuffless. Continue 4-5 L oxygen by trach collar.  Nutrition - Continue Prosource via feeding tube  GERD, stable - Continue PPI per tube  Poorly controlled diabetes mellitus, POA, improving - A1c 11.5 on 8/27 - Blood sugar currently controlled on Lantus 20 units daily and NovoLog 3 units every 4 hours while on tube feeds - Continue Accu-Cheks Recent Labs  Lab 04/29/20 1946 04/29/20 2303 04/30/20 0304 04/30/20 0756 04/30/20 1231  GLUCAP 100* 112* 107* 112* 104*   Anemia of chronic disease - Hemoglobin stable. Vitamin B12, folate and iron level adequate. Recent Labs    03/28/20 0409 03/29/20 0515 03/31/20 1016 04/01/20 0620 04/04/20 1446 04/07/20 0435 04/13/20 0500 04/19/20 0200 04/24/20 0432 04/29/20 0552  HGB 10.6* 11.2* 11.6* 11.2* 11.6* 10.0* 10.6* 10.6* 10.9* 10.7*   History of bipolar disorder, stable - Seroquel on hold. Currently avoiding any sedative medication.  Pressure ulcer in gluteal cleft/sacral area,   - Consistent with stage 4 pressure ulcer. Wound care following. Patient is getting hydrotherapy, debridement packing.  Goals of care - Currently remains full code. Family remains hopeful despite low likelihood of meaningful recovery. Palliative care consult appreciated. Patient remains full code.  Code Status:   Code Status: Full Code Nutritional status: Body mass index is 0.01 kg/m. Nutrition Problem: Increased nutrient needs Etiology: post-op healing Signs/Symptoms: estimated needs Diet Order            Diet NPO time specified  Diet effective midnight                 DVT prophylaxis: heparin injection 5,000 Units Start: 04/04/20 2200 SCDs Start: 03/20/20 1640   Antimicrobials:  IV Rocephin through 05/17/20 Fluid: Tube Feeding at 65 ml/h Consultants: ID,  neurosurgery, neurology, palliative care Family Communication:  Discussed at length with daughter, discussed potential disposition, patient's ongoing status and likely prolonged course for improvement if at all possible, we discussed that patient may be at her new baseline but only time will tell.  Status is: Inpatient  Remains inpatient appropriate because: SNF not available yet  Dispo: The patient is from: Home              Anticipated d/c is to: SNF vs LTAC              Anticipated d/c date is: Pending bed availability              Patient currently IS medically stable to d/c once safe disposition is secured.  Infusions:  . cefTRIAXone (ROCEPHIN)  IV 2 g (04/30/20 0859)  . dextrose 5% lactated ringers 30 mL/hr at 04/28/20 2100  . feeding supplement (JEVITY 1.5 CAL/FIBER) 1,000 mL (04/29/20 0916)    Scheduled Meds: . chlorhexidine gluconate (MEDLINE KIT)  15 mL Mouth Rinse BID  . Chlorhexidine Gluconate Cloth  6 each Topical Daily  . docusate  100 mg Per Tube BID  . feeding supplement (PROSource TF)  45 mL Per Tube TID  . folic acid  1 mg Per Tube Daily  . heparin injection (subcutaneous)  5,000 Units Subcutaneous Q8H  . insulin aspart  0-20 Units Subcutaneous Q4H  . insulin aspart  3 Units Subcutaneous Q4H  . insulin glargine  20 Units Subcutaneous Daily  . levETIRAcetam  500 mg Per  Tube BID  . mouth rinse  15 mL Mouth Rinse 10 times per day  . nutrition supplement (JUVEN)  1 packet Per Tube BID BM  . pantoprazole sodium  40 mg Per Tube QHS  . sodium chloride flush  10-40 mL Intracatheter Q12H  . sorbitol, milk of mag, mineral oil, glycerin (SMOG) enema  960 mL Rectal Once    Antimicrobials: Anti-infectives (From admission, onward)   Start     Dose/Rate Route Frequency Ordered Stop   04/14/20 1715  fluconazole (DIFLUCAN) tablet 150 mg        150 mg Per Tube  Once 04/14/20 1614 04/14/20 1813   04/04/20 1000  ceFAZolin (ANCEF) IVPB 2g/100 mL premix        2 g 200 mL/hr  over 30 Minutes Intravenous To Radiology 04/04/20 0935 04/04/20 1053   04/03/20 0600  ceFAZolin (ANCEF) IVPB 1 g/50 mL premix        1 g 100 mL/hr over 30 Minutes Intravenous To Radiology 04/01/20 1239 04/04/20 0600   03/24/20 1030  vancomycin (VANCOREADY) IVPB 1500 mg/300 mL  Status:  Discontinued        1,500 mg 150 mL/hr over 120 Minutes Intravenous Every 8 hours 03/24/20 0952 03/24/20 1336   03/23/20 0130  vancomycin (VANCOCIN) IVPB 1000 mg/200 mL premix  Status:  Discontinued        1,000 mg 200 mL/hr over 60 Minutes Intravenous Every 8 hours 03/22/20 1647 03/24/20 0952   03/22/20 2200  cefTRIAXone (ROCEPHIN) 2 g in sodium chloride 0.9 % 100 mL IVPB        2 g 200 mL/hr over 30 Minutes Intravenous Every 12 hours 03/22/20 1617     03/22/20 1730  vancomycin (VANCOREADY) IVPB 1750 mg/350 mL        1,750 mg 175 mL/hr over 120 Minutes Intravenous  Once 03/22/20 1647 03/22/20 2030   03/22/20 1630  metroNIDAZOLE (FLAGYL) IVPB 500 mg  Status:  Discontinued        500 mg 100 mL/hr over 60 Minutes Intravenous Every 8 hours 03/22/20 1618 03/24/20 1350   03/22/20 1336  bacitracin 50,000 Units in sodium chloride 0.9 % 500 mL irrigation  Status:  Discontinued          As needed 03/22/20 1336 03/22/20 1341   03/22/20 1315  vancomycin (VANCOCIN) IVPB 1000 mg/200 mL premix  Status:  Discontinued        1,000 mg 200 mL/hr over 60 Minutes Intravenous To Surgery 03/22/20 1313 03/22/20 1545   03/22/20 1315  ceFEPIme (MAXIPIME) 2 g in sodium chloride 0.9 % 100 mL IVPB        2 g 200 mL/hr over 30 Minutes Intravenous To Surgery 03/22/20 1313 03/22/20 1354   03/22/20 1145  ceFAZolin (ANCEF) IVPB 2g/100 mL premix  Status:  Discontinued        2 g 200 mL/hr over 30 Minutes Intravenous  Once 03/22/20 1131 03/22/20 1545   03/22/20 1138  ceFAZolin (ANCEF) 2-4 GM/100ML-% IVPB       Note to Pharmacy: Gleason, Ginger   : cabinet override      03/22/20 1138 03/22/20 2344      PRN meds: acetaminophen  (TYLENOL) oral liquid 160 mg/5 mL, acetaminophen (TYLENOL) oral liquid 160 mg/5 mL **OR** acetaminophen, hydrALAZINE, labetalol, ondansetron (ZOFRAN) IV, oxyCODONE-acetaminophen, polyethylene glycol, promethazine, sodium chloride flush   Objective: Vitals:   04/30/20 0806 04/30/20 1232  BP:  124/69  Pulse: 89 89  Resp: (!) 22 (!) 21  Temp:  99 F (37.2 C)  SpO2: 97% 96%    Intake/Output Summary (Last 24 hours) at 04/30/2020 1430 Last data filed at 04/30/2020 1000 Gross per 24 hour  Intake 3493 ml  Output 800 ml  Net 2693 ml   Filed Weights   04/29/20 0231 04/30/20 0038 04/30/20 0131  Weight: (!) 0.03 kg (!) 0.03 kg (!) 0.03 kg   Weight change: 0 kg Body mass index is 0.01 kg/m.   Physical Exam: General: Resting comfortably in bed, minimally interactive, does not follow commands, minimal reaction to sternal rub or nailbed pressure. HEENT: Tracks movements if eyelids propped open but will not open eyelids to stimuli.   Neck:  Without mass or deformity. Tracheostomy clean dry intact, minimal sputum Lungs:  Clear to auscultate bilaterally without rhonchi, wheeze, or rales. Heart:  Regular rate and rhythm.  Without murmurs, rubs, or gallops. Abdomen: PEG tube bandage clean dry intact soft, nontender, nondistended.  Without guarding or rebound.  Foley catheter in place. Extremities: Without cyanosis, clubbing, edema, or obvious deformity. Vascular:  Dorsalis pedis and posterior tibial pulses palpable bilaterally.  Right proximal arm double-lumen PICC bandage clean dry intact Skin:  Warm and dry, no erythema, stage 4 coccygeal ulcer (defer to wound care notes for further description)   Data Review: I have personally reviewed the laboratory data and studies available.  Recent Labs  Lab 04/24/20 0432 04/29/20 0552  WBC 8.3 9.1  NEUTROABS 5.1  --   HGB 10.9* 10.7*  HCT 34.4* 33.3*  MCV 91.7 90.0  PLT 394 315   Recent Labs  Lab 04/24/20 0432 04/29/20 0552  NA 139 139  K  3.5 3.2*  CL 104 102  CO2 26 29  GLUCOSE 108* 130*  BUN 11 10  CREATININE 0.39* 0.38*  CALCIUM 8.5* 8.4*   Signed, Little Ishikawa, DO  [facility] 04/30/2020

## 2020-04-30 NOTE — Progress Notes (Addendum)
Occupational Therapy Treatment Patient Details Name: Vickie Alvarado MRN: [id number] DOB: 01/18/1966 Today's Date: 04/30/2020    History of present illness 54 y/o F with a history of HTN, HLD, COVID-19 infection (03/01/20), dental infections, and sinus infections presented to [facility] after being found altered with R sided weakness, dysarthria, and R facial droop. Patient had decreased  P.O. intake, decreased appetite, and projectile vomiting in addition to her other symptoms. 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 Neurosurgery performed a L frontal stereotactic biopsy/aspiration of the abscess. 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 ETT 8/28, trach 9/6.   OT comments  Patient +2 for bed mobility and transfers to recliner.  Hand over hand total assist to wash face, supine.  PROM to BUEs, R UE tends towards flexion synergy (but difficult to tell if patient resistive to ROM, as more likely to attempt using functionally compared to L UE). Initiated opening mouth and bringing suction to mouth for secretion mgmt once sitting in recliner and suction placed in R hand. With music, able to scan towards R side with increased time and multimodal cueing; encouraged daughter to sit on R side of pt.  Pt waving to family on phone with R hand upon exit.  Will follow acutely.     Follow Up Recommendations  LTACH;SNF;Supervision/Assistance - 24 hour    Equipment Recommendations  Wheelchair (measurements OT);Hospital bed;Wheelchair cushion (measurements OT);Other (comment)    Recommendations for Other Services      Precautions / Restrictions Precautions Precautions: Fall Precaution Comments: trach; peg Required Braces or Orthoses: Other Brace (bil PRAFO) Restrictions Weight Bearing Restrictions: No       Mobility Bed Mobility Overal bed mobility:  Needs Assistance Bed Mobility: Rolling;Sidelying to Sit Rolling: Total assist Sidelying to sit: Total assist;+2 for physical assistance       General bed mobility comments: total assist +2 required  Transfers Overall transfer level: Needs assistance   Transfers: Sit to/from Stand;Squat Pivot Transfers Sit to Stand: Total assist;+2 safety/equipment   Squat pivot transfers: Total assist;+2 safety/equipment;+2 physical assistance     General transfer comment: pt able to automatically assist with w/bearing to knee lock in extension L>R.  R tending toward flexion if not controlled.    Balance Overall balance assessment: Needs assistance   Sitting balance-Leahy Scale: Zero                                     ADL either performed or assessed with clinical judgement   ADL Overall ADL's : Needs assistance/impaired Eating/Feeding: NPO   Grooming: Wash/dry face;Total assistance;Sitting;Bed level Grooming Details (indicate cue type and reason): bed level, HOH to wash face with R UE                 Toilet Transfer: Total assistance;+2 for physical assistance;+2 for safety/equipment           Functional mobility during ADLs: Total assistance;+2 for physical assistance;+2 for safety/equipment General ADL Comments: total assist for all ADLs      Vision       Perception     Praxis      Cognition Arousal/Alertness: Awake/alert Behavior During Therapy: Flat affect Overall Cognitive Status: Difficult to assess Area of Impairment: Following commands;Awareness;Attention  Orientation Level: Place;Time;Situation Current Attention Level: Focused Memory: Decreased recall of precautions;Decreased short-term memory Following Commands: Follows one step commands inconsistently;Follows one step commands with increased time Safety/Judgement: Decreased awareness of safety;Decreased awareness of deficits Awareness: Intellectual Problem  Solving: Slow processing;Requires verbal cues;Requires tactile cues;Decreased initiation;Difficulty sequencing General Comments: pt interactive with eye contact, scanning towards R side with max multimodal cueing; following simple commands inconsitently; waving to family on facetime once daughter arrived        Exercises Exercises: Other exercises Other Exercises Other Exercises: PROM to BUEs, flexor synergy tone noted on R side only Other Exercises: Increased alertness with music   Shoulder Instructions       General Comments VSS    Pertinent Vitals/ Pain       Pain Assessment: Faces Faces Pain Scale: No hurt Pain Intervention(s): Monitored during session  Home Living                                          Prior Functioning/Environment              Frequency  Min 2X/week        Progress Toward Goals  OT Goals(current goals can now be found in the care plan section)  Progress towards OT goals: Progressing toward goals  Acute Rehab OT Goals Patient Stated Goal: unable ADL Goals Pt Will Perform Grooming: with mod assist;bed level Additional ADL Goal #1: Pt will demonstrate ability to follow 1 step commands 40% of the time to increase ADL participation. Additional ADL Goal #2: Pt/ caregiver will demonstrate appropriate positioning to prevent contracture mgmt and maintain skin integrity. Additional ADL Goal #3: Pt will scan towards R side with minimal multimodal cueing with increased time to locate 1/5 items on table top.  Plan Discharge plan remains appropriate;Frequency remains appropriate    Co-evaluation    PT/OT/SLP Co-Evaluation/Treatment: Yes Reason for Co-Treatment: For patient/therapist safety;To address functional/ADL transfers          AM-PAC OT "6 Clicks" Daily Activity     Outcome Measure   Help from another person eating meals?: Total Help from another person taking care of personal grooming?: Total Help from another  person toileting, which includes using toliet, bedpan, or urinal?: Total Help from another person bathing (including washing, rinsing, drying)?: Total Help from another person to put on and taking off regular upper body clothing?: Total Help from another person to put on and taking off regular lower body clothing?: Total 6 Click Score: 6    End of Session Equipment Utilized During Treatment: Oxygen (TC)  OT Visit Diagnosis: Muscle weakness (generalized) (M62.81);Other abnormalities of gait and mobility (R26.89);Apraxia (R48.2);Ataxia, unspecified (R27.0);Feeding difficulties (R63.3);Other symptoms and signs involving cognitive function;Other symptoms and signs involving the nervous system (R29.898)   Activity Tolerance Patient tolerated treatment well   Patient Left in chair;with call bell/phone within reach;with chair alarm set;with family/visitor present   Nurse Communication Mobility status        Time: 1040-1107 OT Time Calculation (min): 27 min  Charges: OT General Charges $OT Visit: 1 Visit OT Treatments $Self Care/Home Management : 8-22 mins  Vickie Alvarado, OT Acute Rehabilitation Services Pager [phone] Office [phone]    Vickie Alvarado 04/30/2020, 1:10 PM

## 2020-04-30 NOTE — Progress Notes (Addendum)
Nutrition Follow-up  DOCUMENTATION CODES:   Not applicable  INTERVENTION:  Continue Tube feeding via PEG: -Jevity 1.5 @ 55 ml/hr (1320 ml) -ProSource TF 45 ml TID -Juven BID  Tube feeding regimen provides 2100 kcal, 117 grams of protein, and 1003 ml of H2O.    NUTRITION DIAGNOSIS:   Increased nutrient needs related to post-op healing as evidenced by estimated needs.  Ongoing  GOAL:   Patient will meet greater than or equal to 90% of their needs  Met with TF  MONITOR:   TF tolerance, I & O's  REASON FOR ASSESSMENT:   Ventilator, Consult Enteral/tube feeding initiation and management  ASSESSMENT:   Patient with PMH significant for HTN, HLD, COVID 19 infection (03/01/20), and dental infections. Presents this admission with R sided hemiplegia and R sided facial droop. 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  8/27 - pt with abscess in L basal ganglia s/p L frontal biopsy/aspiration 9/06 - trach placed 9/09 - s/p PEG   Pt is stable for d/c to SNF, though placement continues to be difficult.  Pt remains on trach collar and is receiving TF via PEG. Current TF regimen: Jevity 1.5 @ 55 ml/hr (1320 ml) with ProSource TF 45 ml TID and Juven BID. Provides 2100 kcal, 117 grams of protein, and 1003 ml of H2O.   UOP: 1587m x24 hours I/O: +[phone] admit  No accurate wt readings since 9/22.  Labs: K+ 3.2 (L), CBGs 100-112 Medications: colace, folvite, novolog, lantus, protonix IVF: D5 @ 3069mr  Diet Order:   Diet Order            Diet NPO time specified  Diet effective midnight                 EDUCATION NEEDS:   Not appropriate for education at this time  Skin:  Skin Assessment: Skin Integrity Issues: Skin Integrity Issues:: Unstageable, Incisions Unstageable: coccyx Incisions: L head  Last BM:  10/4  Height:   Ht Readings from Last 1 Encounters:  04/06/20 5' 7"  (1.702 m)    Weight:   Wt Readings from Last 1  Encounters:  04/30/20 (!) 0.03 kg   BMI:  Body mass index is 0.01 kg/m.  Estimated Nutritional Needs:   Kcal:  2000-2200 kcal  Protein:  95-120 grams  Fluid:  >/= 2 L/day    AmaLarkin InaS, RD, LDN RD pager number and weekend/on-call pager number located in [place]

## 2020-04-30 NOTE — Consult Note (Addendum)
WOC Nurse wound follow up Patient receiving care in room [name] 2W23 Stage 4 sacral wound evaluated in room. PT to perform last Hyrdotherapy today then this will be discontinued along with [facility] applications.  New orders will be: Insert saline moistened gauze into sacral wound and cover with ABD pad. Change twice daily or PRN soiling beginning at 2200 04/30/20.   Monitor the wound area(s) for worsening of condition such as: Signs/symptoms of infection, increase in size, development of or worsening of odor, development of pain, or increased pain at the affected locations.   Notify the medical team if any of these develop.  Thank you for the consult. WOC nurse will continue to follow weekly. Please re-consult the WOC team if needed.  [name] [name], MSN, RN, CMSRN, Angus Seller, [facility] Wound Treatment Associate Pager [phone]

## 2020-04-30 NOTE — Progress Notes (Signed)
Physical Therapy Wound Treatment Patient Details  Name: Vickie Alvarado MRN: [id number] Date of Birth: 01-14-1966  Today's Date: 04/30/2020 Time: 1016-1040 Time Calculation (min): 24 min  Subjective  Subjective: Pt not communicating verbally. Smiled when I introduced myself Patient and Family Stated Goals: None stated Date of Onset:  (unknown)  Pain Score:    Wound Assessment  Wound / Incision (Open or Dehisced) 04/09/20 Other (Comment) Coccyx Unstageable when assessed on 04/16/20 (Active)  Dressing Type ABD;Barrier Film (skin prep);Gauze (Comment);Moist to dry 04/30/20 1219  Dressing Changed Changed 04/29/20 0955  Dressing Status Clean;Dry;Intact 04/30/20 1219  Dressing Change Frequency Daily 04/30/20 1219  Site / Wound Assessment Pink;Pale;Yellow;Granulation tissue 04/30/20 1219  % Wound base Red or Granulating 60% 04/30/20 1219  % Wound base Yellow/Fibrinous Exudate 15% 04/30/20 1219  % Wound base Black/Eschar 0% 04/26/20 1451  % Wound base Other/Granulation Tissue (Comment) 25%  Viable fascia 04/30/20 1219  Peri-wound Assessment Intact 04/30/20 1219  Wound Length (cm) 8.4 cm 04/24/20 1100  Wound Width (cm) 9.1 cm 04/24/20 1100  Wound Depth (cm) 2.6 cm 04/24/20 1100  Wound Volume (cm^3) 198.74 cm^3 04/24/20 1100  Wound Surface Area (cm^2) 76.44 cm^2 04/24/20 1100  Undermining (cm) 0.5 -2.5 cm from 9 o'clock to 3 o' clock  04/25/20 1200  Margins Unattached edges (unapproximated) 04/30/20 1219  Closure None 04/29/20 0955  Drainage Amount Minimal 04/30/20 1219  Drainage Description Serous;Serosanguineous 04/30/20 1219  Non-staged Wound Description Full thickness 04/29/20 0955  Treatment Cleansed;Debridement (Selective);Hydrotherapy (Ultrasonic mist);Packing (Saline gauze);Other (Comment) 04/30/20 1219  Santyl applied to wound bed prior to applying dressing.    Hydrotherapy Pulsed lavage therapy - wound location: Sacrum Pulsed Lavage with Suction (psi): 4 psi (up to 8 psi;  easily bleeds) Pulsed Lavage with Suction - Normal Saline Used: 1000 mL Pulsed Lavage Tip: Tip with splash shield Selective Debridement Selective Debridement - Location: Sacrum Selective Debridement - Tools Used: Forceps;Scalpel Selective Debridement - Tissue Removed: yellow necrotic tissue   Wound Assessment and Plan  Wound Therapy - Assess/Plan/Recommendations Wound Therapy - Clinical Statement: This wound no longer needs debridement or PLS for overall healing, The nursing staff will be able to manage well with damp to dry dressing changes.  Will sign off at this time. Wound Therapy - Functional Problem List: Decreased overall mobility  Factors Delaying/Impairing Wound Healing: Immobility;Multiple medical problems Hydrotherapy Plan: Debridement;Dressing change;Patient/family education;Pulsatile lavage with suction Wound Therapy - Frequency: 6X / week Wound Therapy - Follow Up Recommendations: Skilled nursing facility Wound Plan: See above  Wound Therapy Goals- Improve the function of patient's integumentary system by progressing the wound(s) through the phases of wound healing (inflammation - proliferation - remodeling) by: Decrease Necrotic Tissue to: 20% Decrease Necrotic Tissue - Progress: Met Increase Granulation Tissue to: 80% Increase Granulation Tissue - Progress: Met Goals/treatment plan/discharge plan were made with and agreed upon by patient/family: No, Patient unable to participate in goals/treatment/discharge plan and family unavailable Time For Goal Achievement: 7 days Wound Therapy - Potential for Goals: Fair  Goals will be updated until maximal potential achieved or discharge criteria met.  Discharge criteria: when goals achieved, discharge from hospital, MD decision/surgical intervention, no progress towards goals, refusal/missing three consecutive treatments without notification or medical reason.  GP     Tessie Fass Tashona Calk 04/30/2020, 12:23 PM  04/30/2020  Ginger Carne., PT Acute Rehabilitation Services [phone]  (pager) [phone]  (office)04/30/2020

## 2020-04-30 NOTE — Progress Notes (Signed)
Physical Therapy Treatment Patient Details Name: Vickie Alvarado MRN: [id number] DOB: Aug 30, 1965 Today's Date: 04/30/2020    History of Present Illness 54 y/o F with a history of HTN, HLD, COVID-19 infection (03/01/20), dental infections, and sinus infections presented to [facility] after being found altered with R sided weakness, dysarthria, and R facial droop. Patient had decreased  P.O. intake, decreased appetite, and projectile vomiting in addition to her other symptoms. 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 Neurosurgery performed a L frontal stereotactic biopsy/aspiration of the abscess. 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 ETT 8/28, trach 9/6.    PT Comments    Pt still appearing mostly unaware of her surrounding, but did show some ability for her focus to be directed to task.  Pt's daughter and family on the cell brought out a smile and a wave from the pt.  Emphasis on ROM, activity to elicit muscle activation and focus on task.  Transitions to EOB, sitting EOB, sit to stand and transfer to chair to work on sitting tolerance.    Follow Up Recommendations  SNF;LTACH     Equipment Recommendations  None recommended by PT    Recommendations for Other Services       Precautions / Restrictions Precautions Precautions: Fall Precaution Comments: trach; peg Required Braces or Orthoses: Other Brace (bil PRAFO) Restrictions Weight Bearing Restrictions: No    Mobility  Bed Mobility Overal bed mobility: Needs Assistance Bed Mobility: Rolling;Sidelying to Sit Rolling: Total assist Sidelying to sit: Total assist;+2 for physical assistance       General bed mobility comments: At times pt resistant to roll, but pt was able to help move in the direction desired on occasion.  Rolls onto her back better than from supine to  prone.  Transfers Overall transfer level: Needs assistance   Transfers: Sit to/from [employer] Transfers Sit to Stand: Total assist;+2 safety/equipment   Squat pivot transfers: Total assist;+2 safety/equipment;+2 physical assistance     General transfer comment: pt able to automatically assist with w/bearing to knee lock in extension L>R.  R tending toward flexion if not controlled.  Ambulation/Gait             General Gait Details: deferred   Stairs             Wheelchair Mobility    Modified Rankin (Stroke Patients Only) Modified Rankin (Stroke Patients Only) Pre-Morbid Rankin Score: No symptoms Modified Rankin: Severe disability     Balance Overall balance assessment: Needs assistance   Sitting balance-Leahy Scale: Zero                                      Cognition Arousal/Alertness: Awake/alert Behavior During Therapy: Flat affect Overall Cognitive Status: Difficult to assess                   Orientation Level: Place;Time;Situation Current Attention Level: Focused Memory: Decreased recall of precautions;Decreased short-term memory Following Commands: Follows one step commands inconsistently;Follows one step commands with increased time Safety/Judgement: Decreased awareness of safety;Decreased awareness of deficits Awareness: Intellectual Problem Solving: Slow processing;Requires verbal cues;Requires tactile cues;Decreased initiation;Difficulty sequencing        Exercises Other Exercises Other Exercises: Provided PROM to B LE's, R LE  showed increased tone, but L LE showed no spontaneous or voluntary movement at time of ROM Other Exercises: Increased alertness with music    General Comments General comments (skin integrity, edema, etc.): vss.  Pt's dtr showed up at end of session and received a synopsis of the session.      Pertinent Vitals/Pain Pain Assessment: Faces Faces Pain Scale: No hurt Pain  Intervention(s): Monitored during session    Home Living                      Prior Function            PT Goals (current goals can now be found in the care plan section) Acute Rehab PT Goals Patient Stated Goal: unable PT Goal Formulation: Patient unable to participate in goal setting Time For Goal Achievement: 05/08/20 Potential to Achieve Goals: Fair Progress towards PT goals: Not progressing toward goals - comment (negligibly more able to attend to an activity with cues)    Frequency    Min 2X/week      PT Plan Current plan remains appropriate    Co-evaluation PT/OT/SLP Co-Evaluation/Treatment: Yes Reason for Co-Treatment: For patient/therapist safety;To address functional/ADL transfers          AM-PAC PT "6 Clicks" Mobility   Outcome Measure  Help needed turning from your back to your side while in a flat bed without using bedrails?: Total Help needed moving from lying on your back to sitting on the side of a flat bed without using bedrails?: Total Help needed moving to and from a bed to a chair (including a wheelchair)?: Total Help needed standing up from a chair using your arms (e.g., wheelchair or bedside chair)?: Total Help needed to walk in hospital room?: Total Help needed climbing 3-5 steps with a railing? : Total 6 Click Score: 6    End of Session Equipment Utilized During Treatment: Oxygen Activity Tolerance: Patient tolerated treatment well Patient left: in chair;with call bell/phone within reach;with chair alarm set (lift pad.) Nurse Communication: Mobility status PT Visit Diagnosis: Other abnormalities of gait and mobility (R26.89);Muscle weakness (generalized) (M62.81);Other symptoms and signs involving the nervous system (R29.898) Hemiplegia - Right/Left: Right Hemiplegia - dominant/non-dominant: Dominant Hemiplegia - caused by: Other cerebrovascular disease     Time: 1040-1107 PT Time Calculation (min) (ACUTE ONLY): 27  min  Charges:  $Therapeutic Activity: 8-22 mins                     04/30/2020  Jacinto Halim., PT Acute Rehabilitation Services [phone]  (pager) [phone]  (office)   Eliseo Gum Clarise Chacko 04/30/2020, 12:39 PM

## 2020-05-01 DIAGNOSIS — G060 Intracranial abscess and granuloma: Secondary | ICD-10-CM | POA: Diagnosis not present

## 2020-05-01 LAB — GLUCOSE, CAPILLARY
Glucose-Capillary: 87 mg/dL (ref 70–99)
Glucose-Capillary: 114 mg/dL — ABNORMAL HIGH (ref 70–99)
Glucose-Capillary: 127 mg/dL — ABNORMAL HIGH (ref 70–99)
Glucose-Capillary: 133 mg/dL — ABNORMAL HIGH (ref 70–99)
Glucose-Capillary: 113 mg/dL — ABNORMAL HIGH (ref 70–99)
Glucose-Capillary: 106 mg/dL — ABNORMAL HIGH (ref 70–99)

## 2020-05-01 NOTE — TOC Progression Note (Signed)
Transition of Care [facility]) - Progression Note    Patient Details  Name: Merly Hinkson MRN: [id number] Date of Birth: [birth year]  Transition of Care [facility]) CM/SW Contact  Beckie Busing, RN Phone Number: [phone]  05/01/2020, 3:49 PM  Clinical Narrative:    CM just received notification from [facility] that the facility has obtained insurance authorization. Daughter Lanier Ensign has been made aware and states that she is in the process of trying to schedule a virtual tour of the facility. Daughter stares that she is not opposed to discharge to [facility] but just wants to make sure this is a good facility for her mother. Daughter has just reached out to CM to say that she has left another message requesting a virtual tour. CM will send MD a message making him aware of bed offer.     Expected Discharge Plan: Skilled Nursing Facility Barriers to Discharge: Continued Medical Work up  Expected Discharge Plan and Services Expected Discharge Plan: Skilled Nursing Facility In-house Referral: NA Discharge Planning Services: CM Consult   Living arrangements for the past 2 months: Single Family Home                 DME Arranged: N/A DME Agency: NA       HH Arranged: NA HH Agency: NA         Social Determinants of Health (SDOH) Interventions    Readmission Risk Interventions No flowsheet data found.

## 2020-05-01 NOTE — Progress Notes (Signed)
PROGRESS NOTE  Vickie Alvarado  DOB: 1966/07/09  PCP: [facility] VQQ:[id number]  DOA: 03/20/2020  LOS: 42 days   Chief complaint: Right-sided weakness, dysarthria, right facial droop  Brief narrative: Vickie Alvarado is a 54 y.o. year old female with medical history significant for HTN, HLD, COVID-19 infection (diagnosed 03/01/2020), dental infection and sinus infections.  Timeline of events:  8/25, patient presented with right-sided weakness, dysarthria and right facial droop also to have abscess in the left basal ganglia.  Patient was admitted to the hospital.  8/27, underwent stereotactic biopsy and aspiration of the abscess by neurosurgery and was transferred to ICU.  Wound culture resulted Streptococcus intermedius  8/27, MRI brain was obtained for worsening mental status.  It showed new large left PCA infarct along with scattered infarcts in the right basal ganglia and right thalamus concerning for either CVA related to septic emboli versus herniation syndrome. Neurosurgery believed patient developed the new development of the posterior cerebral artery distribution infarct due to to sequelae from herniation. Given her large hemisphere brain abscess with surrounding edema/cerebritis neurosurgery did not recommend decompressive surgery.  Patient was placed on a tapering course of Decadron which she completed by now.  8/28, PICC line placed  9/6, tracheostomy  9/9, PEG placement and tube feeding started  9/13, repeat CT head showed improvement in fluid collections blood showed persistent left parietal/occipital acute/subacute infarcts with improvement in midline shift  9/27-28 low grade (100.4) fever x2 - resolved - no acute indication for change in management - loose stool noted - laxatives stopped with resolution of soft BM  Patient's hospital stay is prolonged because of inability to find an appropriate SNF for her, she was previously requiring hydrotherapy  wound care which has now been decreased which may allow for easier discharge.  She has a tracheostomy, and PEG tube care requirements.  Subjective: No acute issues or events overnight, patient remains stable, moderately more interactive on exam today able to follow simple commands like squeezing eyes shut, nodding head yes or no.  Assessment/Plan: Principal Problem:   Brain abscess Active Problems:   Weakness of extremity   Essential hypertension   Hyperlipidemia   Anxiety   Bipolar disorder (HCC)   Cerebral embolism with cerebral infarction   Cerebral abscess   Cerebritis   Abdominal distension   Palliative care by specialist   Goals of care, counseling/discussion   Altered mental status   Adult failure to thrive   Acute traumatic/metabolic encephalopathy, POA, minimal resolution - Patient has poor but waxing and waning neurological status.  Today patient was able to follow simple commands "nod head yes or no, squeeze eyes shut", communication still severely limited but this appears to be some mild improvement - Multifactorial: Brain abscess, brain tissue herniation, vasogenic edema, CVA - Unlikely to have meaningful recovery. However patient's family remains hopeful.  PT/OT/palliative care on board. 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 -  Patient had two episodes of low-grade fever 9/27-28th  after pressure ulcer wound manipulation which has now resolved - On IV Rocephin for 8 weeks course as directed by ID to complete on 05/17/2020. - Completed a course of Decadron. - Continue Keppra 500 mg twice daily for seizure prophylaxis. - Continue to monitor on IV Rocephin.    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  Acute hypoxic respiratory failure - Status post trach 04/01/2020 - Stable 4-5 L of O2 on trach collar.  - PCCM following.  Trach tube downsized to 6 -0 cuffless. Continue 4-5 L oxygen by trach collar.  Nutrition - Continue Prosource via feeding tube  GERD, stable - Continue PPI per tube  Poorly controlled diabetes mellitus, POA, improving - A1c 11.5 on 8/27 - Blood sugar currently controlled on Lantus 20 units daily and NovoLog 3 units every 4 hours while on tube feeds - Continue Accu-Cheks Recent Labs  Lab 04/30/20 1934 04/30/20 2303 05/01/20 0308 05/01/20 0856 05/01/20 1226  GLUCAP 122* 115* 87 114* 127*   Anemia of chronic disease - Hemoglobin stable. Vitamin B12, folate and iron level adequate. Recent Labs    03/28/20 0409 03/29/20 0515 03/31/20 1016 04/01/20 0620 04/04/20 1446 04/07/20 0435 04/13/20 0500 04/19/20 0200 04/24/20 0432 04/29/20 0552  HGB 10.6* 11.2* 11.6* 11.2* 11.6* 10.0* 10.6* 10.6* 10.9* 10.7*   History of bipolar disorder, stable - Seroquel on hold. Currently avoiding any sedative medication.  Pressure ulcer in gluteal cleft/sacral area,   - Consistent with stage 4 pressure ulcer. Wound care following. Patient is transitioning from hydrotherapy to regular dressing changes, debridement, packing.  Goals of care - Currently remains full code. Family remains hopeful despite low likelihood of meaningful recovery. Palliative care consult appreciated. Patient remains full code.  Code Status:   Code Status: Full Code Nutritional status: Body mass index is 0.01 kg/m. Nutrition Problem: Increased nutrient needs Etiology: post-op healing Signs/Symptoms: estimated needs Diet Order            Diet NPO time specified  Diet effective midnight                 DVT prophylaxis: heparin injection 5,000 Units Start: 04/04/20 2200 SCDs Start: 03/20/20 1640   Antimicrobials:  IV Rocephin through 05/17/20 Fluid: Tube Feeding  at 65 ml/h Consultants: ID, neurosurgery, neurology, palliative care Family Communication:  Discussed at length with daughter, discussed potential disposition, patient's ongoing status and likely prolonged course for improvement if at all possible, we discussed that patient may be at her new baseline but only time will tell.  Status is: Inpatient  Remains inpatient appropriate because: SNF not available yet  Dispo: The patient is from: Home              Anticipated d/c is to: SNF pending family choosing facility              Anticipated d/c date is: Pending bed availability              Patient currently IS medically stable to d/c once safe disposition is secured.  Infusions:   cefTRIAXone (ROCEPHIN)  IV Stopped (05/01/20 1000)   dextrose 5% lactated ringers 30 mL/hr at 04/28/20 2100   feeding supplement (JEVITY 1.5 CAL/FIBER) 1,000 mL (04/29/20 0916)    Scheduled Meds:  chlorhexidine gluconate (MEDLINE KIT)  15 mL Mouth Rinse BID   Chlorhexidine Gluconate Cloth  6 each Topical Daily   feeding supplement (PROSource TF)  45 mL Per Tube TID   folic acid  1 mg Per Tube Daily   heparin injection (subcutaneous)  5,000 Units Subcutaneous Q8H   insulin aspart  0-20 Units Subcutaneous Q4H   insulin aspart  3 Units Subcutaneous Q4H   insulin glargine  20 Units Subcutaneous Daily   levETIRAcetam  500 mg Per Tube BID  mouth rinse  15 mL Mouth Rinse 10 times per day   nutrition supplement (JUVEN)  1 packet Per Tube BID BM   pantoprazole sodium  40 mg Per Tube QHS   sodium chloride flush  10-40 mL Intracatheter Q12H   sorbitol, milk of mag, mineral oil, glycerin (SMOG) enema  960 mL Rectal Once    Antimicrobials: Anti-infectives (From admission, onward)   Start     Dose/Rate Route Frequency Ordered Stop   04/14/20 1715  fluconazole (DIFLUCAN) tablet 150 mg        150 mg Per Tube  Once 04/14/20 1614 04/14/20 1813   04/04/20 1000  ceFAZolin (ANCEF) IVPB 2g/100 mL premix         2 g 200 mL/hr over 30 Minutes Intravenous To Radiology 04/04/20 0935 04/04/20 1053   04/03/20 0600  ceFAZolin (ANCEF) IVPB 1 g/50 mL premix        1 g 100 mL/hr over 30 Minutes Intravenous To Radiology 04/01/20 1239 04/04/20 0600   03/24/20 1030  vancomycin (VANCOREADY) IVPB 1500 mg/300 mL  Status:  Discontinued        1,500 mg 150 mL/hr over 120 Minutes Intravenous Every 8 hours 03/24/20 0952 03/24/20 1336   03/23/20 0130  vancomycin (VANCOCIN) IVPB 1000 mg/200 mL premix  Status:  Discontinued        1,000 mg 200 mL/hr over 60 Minutes Intravenous Every 8 hours 03/22/20 1647 03/24/20 0952   03/22/20 2200  cefTRIAXone (ROCEPHIN) 2 g in sodium chloride 0.9 % 100 mL IVPB        2 g 200 mL/hr over 30 Minutes Intravenous Every 12 hours 03/22/20 1617     03/22/20 1730  vancomycin (VANCOREADY) IVPB 1750 mg/350 mL        1,750 mg 175 mL/hr over 120 Minutes Intravenous  Once 03/22/20 1647 03/22/20 2030   03/22/20 1630  metroNIDAZOLE (FLAGYL) IVPB 500 mg  Status:  Discontinued        500 mg 100 mL/hr over 60 Minutes Intravenous Every 8 hours 03/22/20 1618 03/24/20 1350   03/22/20 1336  bacitracin 50,000 Units in sodium chloride 0.9 % 500 mL irrigation  Status:  Discontinued          As needed 03/22/20 1336 03/22/20 1341   03/22/20 1315  vancomycin (VANCOCIN) IVPB 1000 mg/200 mL premix  Status:  Discontinued        1,000 mg 200 mL/hr over 60 Minutes Intravenous To Surgery 03/22/20 1313 03/22/20 1545   03/22/20 1315  ceFEPIme (MAXIPIME) 2 g in sodium chloride 0.9 % 100 mL IVPB        2 g 200 mL/hr over 30 Minutes Intravenous To Surgery 03/22/20 1313 03/22/20 1354   03/22/20 1145  ceFAZolin (ANCEF) IVPB 2g/100 mL premix  Status:  Discontinued        2 g 200 mL/hr over 30 Minutes Intravenous  Once 03/22/20 1131 03/22/20 1545   03/22/20 1138  ceFAZolin (ANCEF) 2-4 GM/100ML-% IVPB       Note to Pharmacy: Gleason, Ginger   : cabinet override      03/22/20 1138 03/22/20 2344      PRN  meds: acetaminophen (TYLENOL) oral liquid 160 mg/5 mL, acetaminophen (TYLENOL) oral liquid 160 mg/5 mL **OR** acetaminophen, hydrALAZINE, labetalol, ondansetron (ZOFRAN) IV, oxyCODONE-acetaminophen, polyethylene glycol, promethazine, sodium chloride flush   Objective: Vitals:   05/01/20 0858 05/01/20 1145  BP: (!) 145/79   Pulse: 91 86  Resp: 20 18  Temp: 98.2 F (36.8 C)  SpO2: 100% 99%    Intake/Output Summary (Last 24 hours) at 05/01/2020 1512 Last data filed at 05/01/2020 1000 Gross per 24 hour  Intake 100 ml  Output 1770 ml  Net -1670 ml   Filed Weights   04/30/20 0038 04/30/20 0131 05/01/20 0122  Weight: (!) 0.03 kg (!) 0.03 kg (!) 0.03 kg   Weight change: 0 kg Body mass index is 0.01 kg/m.   Physical Exam: General: Resting comfortably in bed, minimally interactive, following commands today able to shake head yes/no and close eyes to command. HEENT: Tracks movements today - opens eyes spontaneous to voice.   Neck:  Without mass or deformity. Tracheostomy clean dry intact, minimal sputum Lungs:  Clear to auscultate bilaterally without rhonchi, wheeze, or rales. Heart:  Regular rate and rhythm.  Without murmurs, rubs, or gallops. Abdomen: PEG tube bandage clean dry intact soft, nontender, nondistended.  Without guarding or rebound.  Foley catheter in place. Extremities: Without cyanosis, clubbing, edema, or obvious deformity. Vascular:  Dorsalis pedis and posterior tibial pulses palpable bilaterally.  Right proximal arm double-lumen PICC bandage clean dry intact Skin:  Warm and dry, no erythema, stage 4 coccygeal ulcer (defer to wound care notes for further description)   Data Review: I have personally reviewed the laboratory data and studies available.  Recent Labs  Lab 04/29/20 0552  WBC 9.1  HGB 10.7*  HCT 33.3*  MCV 90.0  PLT 315   Recent Labs  Lab 04/29/20 0552  NA 139  K 3.2*  CL 102  CO2 29  GLUCOSE 130*  BUN 10  CREATININE 0.38*  CALCIUM 8.4*    Signed, Little Ishikawa, DO  [facility] 05/01/2020

## 2020-05-02 LAB — GLUCOSE, CAPILLARY
Glucose-Capillary: 133 mg/dL — ABNORMAL HIGH (ref 70–99)
Glucose-Capillary: 100 mg/dL — ABNORMAL HIGH (ref 70–99)
Glucose-Capillary: 135 mg/dL — ABNORMAL HIGH (ref 70–99)
Glucose-Capillary: 87 mg/dL (ref 70–99)
Glucose-Capillary: 120 mg/dL — ABNORMAL HIGH (ref 70–99)

## 2020-05-02 LAB — CBC
HCT: 33.3 % — ABNORMAL LOW (ref 36.0–46.0)
Hemoglobin: 10.9 g/dL — ABNORMAL LOW (ref 12.0–15.0)
MCH: 29.9 pg (ref 26.0–34.0)
MCHC: 32.7 g/dL (ref 30.0–36.0)
MCV: 91.2 fL (ref 80.0–100.0)
Platelets: 324 10*3/uL (ref 150–400)
RBC: 3.65 MIL/uL — ABNORMAL LOW (ref 3.87–5.11)
RDW: 15.9 % — ABNORMAL HIGH (ref 11.5–15.5)
WBC: 9.7 10*3/uL (ref 4.0–10.5)
nRBC: 0 % (ref 0.0–0.2)

## 2020-05-02 LAB — COMPREHENSIVE METABOLIC PANEL
ALT: 40 U/L (ref 0–44)
AST: 24 U/L (ref 15–41)
Albumin: 2.3 g/dL — ABNORMAL LOW (ref 3.5–5.0)
Alkaline Phosphatase: 110 U/L (ref 38–126)
Anion gap: 10 (ref 5–15)
BUN: 11 mg/dL (ref 6–20)
CO2: 28 mmol/L (ref 22–32)
Calcium: 8.3 mg/dL — ABNORMAL LOW (ref 8.9–10.3)
Chloride: 102 mmol/L (ref 98–111)
Creatinine, Ser: 0.42 mg/dL — ABNORMAL LOW (ref 0.44–1.00)
GFR calc non Af Amer: >60 mL/min (ref >60)
Glucose, Bld: 108 mg/dL — ABNORMAL HIGH (ref 70–99)
Potassium: 3.3 mmol/L — ABNORMAL LOW (ref 3.5–5.1)
Sodium: 140 mmol/L (ref 135–145)
Total Bilirubin: 0.3 mg/dL (ref 0.3–1.2)
Total Protein: 5.2 g/dL — ABNORMAL LOW (ref 6.5–8.1)

## 2020-05-02 NOTE — Progress Notes (Signed)
PROGRESS NOTE  Vickie Alvarado  DOB: [birth year]  PCP: [facility] PZW:[id number]  DOA: 03/20/2020  LOS: 43 days   Chief complaint: Right-sided weakness, dysarthria, right facial droop  Brief narrative: Vickie Alvarado is a 54 y.o. year old female with medical history significant for HTN, HLD, COVID-19 infection (diagnosed 03/01/2020), dental infection and sinus infections.  Timeline of events:  8/25, patient presented with right-sided weakness, dysarthria and right facial droop also to have abscess in the left basal ganglia.  Patient was admitted to the hospital.  8/27, underwent stereotactic biopsy and aspiration of the abscess by neurosurgery and was transferred to ICU.  Wound culture resulted Streptococcus intermedius  8/27, MRI brain was obtained for worsening mental status.  It showed new large left PCA infarct along with scattered infarcts in the right basal ganglia and right thalamus concerning for either CVA related to septic emboli versus herniation syndrome. Neurosurgery believed patient developed the new development of the posterior cerebral artery distribution infarct due to to sequelae from herniation. Given her large hemisphere brain abscess with surrounding edema/cerebritis neurosurgery did not recommend decompressive surgery.  Patient was placed on a tapering course of Decadron which she completed by now.  8/28, PICC line placed  9/6, tracheostomy  9/9, PEG placement and tube feeding started  9/13, repeat CT head showed improvement in fluid collections blood showed persistent left parietal/occipital acute/subacute infarcts with improvement in midline shift  9/27-28 low grade (100.4) fever x2 - resolved - no acute indication for change in management - loose stool noted - laxatives stopped with resolution of soft BM  Patient's hospital stay is prolonged because of inability to find an appropriate SNF for her, she was previously requiring hydrotherapy  wound care which has now been decreased which may allow for easier discharge.  She has a tracheostomy, and PEG tube care requirements.  Subjective: No acute issues or events overnight, patient remains stable, moderately more interactive on exam today able to follow simple commands like squeezing eyes shut, nodding head yes or no.  Assessment/Plan: Principal Problem:   Brain abscess Active Problems:   Weakness of extremity   Essential hypertension   Hyperlipidemia   Anxiety   Bipolar disorder (HCC)   Cerebral embolism with cerebral infarction   Cerebral abscess   Cerebritis   Abdominal distension   Palliative care by specialist   Goals of care, counseling/discussion   Altered mental status   Adult failure to thrive   Acute traumatic/metabolic encephalopathy, POA, minimal resolution - Patient has poor but waxing and waning neurological status.  Today patient was able to follow simple commands "nod head yes or no, squeeze eyes shut", communication still severely limited but this appears to be some mild improvement - Multifactorial: Brain abscess, brain tissue herniation, vasogenic edema, CVA - Unlikely to have meaningful recovery. However patient's family remains hopeful.  PT/OT/palliative care on board. 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 -  Patient had two episodes of low-grade fever 9/27-28th  after pressure ulcer wound manipulation which has now resolved - On IV Rocephin for 8 weeks course as directed by ID to complete on 05/17/2020. - Completed a course of Decadron. - Continue Keppra 500 mg twice daily for seizure prophylaxis. - Continue to monitor on IV Rocephin.    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  Acute hypoxic respiratory failure - Status post trach 04/01/2020 - Stable 4-5 L of O2 on trach collar.  - PCCM following.  Trach tube downsized to 6 -0 cuffless. Continue 4-5 L oxygen by trach collar.  Nutrition - Continue Prosource via feeding tube  GERD, stable - Continue PPI per tube  Poorly controlled diabetes mellitus, POA, improving - A1c 11.5 on 8/27 - Blood sugar currently controlled on Lantus 20 units daily and NovoLog 3 units every 4 hours while on tube feeds - Continue Accu-Cheks Recent Labs  Lab 05/01/20 1955 05/01/20 2310 05/02/20 0309 05/02/20 0756 05/02/20 1124  GLUCAP 113* 106* 133* 100* 135*   Anemia of chronic disease - Hemoglobin stable. Vitamin B12, folate and iron level adequate. Recent Labs    03/29/20 0515 03/31/20 1016 04/01/20 0620 04/04/20 1446 04/07/20 0435 04/13/20 0500 04/19/20 0200 04/24/20 0432 04/29/20 0552 05/02/20 0417  HGB 11.2* 11.6* 11.2* 11.6* 10.0* 10.6* 10.6* 10.9* 10.7* 10.9*   History of bipolar disorder, stable - Seroquel on hold. Currently avoiding any sedative medication.  Pressure ulcer in gluteal cleft/sacral area,   - Consistent with stage 4 pressure ulcer. Wound care following. Patient is transitioning from hydrotherapy to regular dressing changes, debridement, packing.  Goals of care - Currently remains full code. Family remains hopeful despite low likelihood of meaningful recovery. Palliative care consult appreciated. Patient remains full code.  Code Status:   Code Status: Full Code Nutritional status: Body mass index is 0.01 kg/m. Nutrition Problem: Increased nutrient needs Etiology: post-op healing Signs/Symptoms: estimated needs Diet Order            Diet NPO time specified  Diet effective midnight                 DVT prophylaxis: heparin injection 5,000 Units Start: 04/04/20 2200 SCDs Start: 03/20/20 1640   Antimicrobials:  IV Rocephin through 05/17/20 Fluid: Tube  Feeding at 65 ml/h Consultants: ID, neurosurgery, neurology, palliative care Family Communication:  Discussed at length with daughter at bedside, discussed potential disposition, patient's ongoing status and likely prolonged course for improvement if at all possible, we discussed that patient may be at her new baseline but only time will tell.  Status is: Inpatient  Remains inpatient appropriate because: SNF not available yet  Dispo: The patient is from: Home              Anticipated d/c is to: SNF pending family choosing facility              Anticipated d/c date is: Pending bed availability              Patient currently IS medically stable to d/c once safe disposition is secured.  Infusions:   cefTRIAXone (ROCEPHIN)  IV 2 g (05/02/20 0852)   dextrose 5% lactated ringers 30 mL/hr at 05/01/20 1826   feeding supplement (JEVITY 1.5 CAL/FIBER) 1,000 mL (05/02/20 1329)    Scheduled Meds:  chlorhexidine gluconate (MEDLINE KIT)  15 mL Mouth Rinse BID   Chlorhexidine Gluconate Cloth  6 each Topical Daily   feeding supplement (PROSource TF)  45 mL Per Tube TID   folic acid  1 mg Per Tube Daily   heparin injection (subcutaneous)  5,000 Units Subcutaneous Q8H   insulin aspart  0-20 Units Subcutaneous Q4H   insulin aspart  3 Units Subcutaneous Q4H   insulin glargine  20 Units Subcutaneous Daily   levETIRAcetam  500 mg Per  Tube BID   mouth rinse  15 mL Mouth Rinse 10 times per day   nutrition supplement (JUVEN)  1 packet Per Tube BID BM   pantoprazole sodium  40 mg Per Tube QHS   sodium chloride flush  10-40 mL Intracatheter Q12H   sorbitol, milk of mag, mineral oil, glycerin (SMOG) enema  960 mL Rectal Once    Antimicrobials: Anti-infectives (From admission, onward)   Start     Dose/Rate Route Frequency Ordered Stop   04/14/20 1715  fluconazole (DIFLUCAN) tablet 150 mg        150 mg Per Tube  Once 04/14/20 1614 04/14/20 1813   04/04/20 1000  ceFAZolin (ANCEF) IVPB  2g/100 mL premix        2 g 200 mL/hr over 30 Minutes Intravenous To Radiology 04/04/20 0935 04/04/20 1053   04/03/20 0600  ceFAZolin (ANCEF) IVPB 1 g/50 mL premix        1 g 100 mL/hr over 30 Minutes Intravenous To Radiology 04/01/20 1239 04/04/20 0600   03/24/20 1030  vancomycin (VANCOREADY) IVPB 1500 mg/300 mL  Status:  Discontinued        1,500 mg 150 mL/hr over 120 Minutes Intravenous Every 8 hours 03/24/20 0952 03/24/20 1336   03/23/20 0130  vancomycin (VANCOCIN) IVPB 1000 mg/200 mL premix  Status:  Discontinued        1,000 mg 200 mL/hr over 60 Minutes Intravenous Every 8 hours 03/22/20 1647 03/24/20 0952   03/22/20 2200  cefTRIAXone (ROCEPHIN) 2 g in sodium chloride 0.9 % 100 mL IVPB        2 g 200 mL/hr over 30 Minutes Intravenous Every 12 hours 03/22/20 1617     03/22/20 1730  vancomycin (VANCOREADY) IVPB 1750 mg/350 mL        1,750 mg 175 mL/hr over 120 Minutes Intravenous  Once 03/22/20 1647 03/22/20 2030   03/22/20 1630  metroNIDAZOLE (FLAGYL) IVPB 500 mg  Status:  Discontinued        500 mg 100 mL/hr over 60 Minutes Intravenous Every 8 hours 03/22/20 1618 03/24/20 1350   03/22/20 1336  bacitracin 50,000 Units in sodium chloride 0.9 % 500 mL irrigation  Status:  Discontinued          As needed 03/22/20 1336 03/22/20 1341   03/22/20 1315  vancomycin (VANCOCIN) IVPB 1000 mg/200 mL premix  Status:  Discontinued        1,000 mg 200 mL/hr over 60 Minutes Intravenous To Surgery 03/22/20 1313 03/22/20 1545   03/22/20 1315  ceFEPIme (MAXIPIME) 2 g in sodium chloride 0.9 % 100 mL IVPB        2 g 200 mL/hr over 30 Minutes Intravenous To Surgery 03/22/20 1313 03/22/20 1354   03/22/20 1145  ceFAZolin (ANCEF) IVPB 2g/100 mL premix  Status:  Discontinued        2 g 200 mL/hr over 30 Minutes Intravenous  Once 03/22/20 1131 03/22/20 1545   03/22/20 1138  ceFAZolin (ANCEF) 2-4 GM/100ML-% IVPB       Note to Pharmacy: Gleason, Ginger   : cabinet override      03/22/20 1138 03/22/20 2344       PRN meds: acetaminophen (TYLENOL) oral liquid 160 mg/5 mL, acetaminophen (TYLENOL) oral liquid 160 mg/5 mL **OR** acetaminophen, hydrALAZINE, labetalol, ondansetron (ZOFRAN) IV, oxyCODONE-acetaminophen, polyethylene glycol, promethazine, sodium chloride flush   Objective: Vitals:   05/02/20 0843 05/02/20 1222  BP:    Pulse: 96 98  Resp: 18 16  Temp:  SpO2: 98% 100%    Intake/Output Summary (Last 24 hours) at 05/02/2020 1511 Last data filed at 05/02/2020 1016 Gross per 24 hour  Intake 20 ml  Output 1500 ml  Net -1480 ml   Filed Weights   05/01/20 0122 05/01/20 2303 05/02/20 0133  Weight: (!) 0.03 kg (!) 0.03 kg (!) 0.03 kg   Weight change: 0 kg Body mass index is 0.01 kg/m.   Physical Exam: General: Resting comfortably in bed, minimally interactive, following commands today able to shake head yes/no and close eyes to command. HEENT: Tracks movements today - opens eyes spontaneous to voice.   Neck:  Without mass or deformity. Tracheostomy clean dry intact, minimal sputum Lungs:  Clear to auscultate bilaterally without rhonchi, wheeze, or rales. Heart:  Regular rate and rhythm.  Without murmurs, rubs, or gallops. Abdomen: PEG tube bandage clean dry intact soft, nontender, nondistended.  Without guarding or rebound.  Foley catheter in place. Extremities: Without cyanosis, clubbing, edema, or obvious deformity. Vascular:  Dorsalis pedis and posterior tibial pulses palpable bilaterally.  Right proximal arm double-lumen PICC bandage clean dry intact Skin:  Warm and dry, no erythema, stage 4 coccygeal ulcer (defer to wound care notes for further description)   Data Review: I have personally reviewed the laboratory data and studies available.  Recent Labs  Lab 04/29/20 0552 05/02/20 0417  WBC 9.1 9.7  HGB 10.7* 10.9*  HCT 33.3* 33.3*  MCV 90.0 91.2  PLT 315 324   Recent Labs  Lab 04/29/20 0552 05/02/20 0417  NA 139 140  K 3.2* 3.3*  CL 102 102  CO2 29 28   GLUCOSE 130* 108*  BUN 10 11  CREATININE 0.38* 0.42*  CALCIUM 8.4* 8.3*   Signed, Little Ishikawa, DO  [facility] 05/02/2020

## 2020-05-02 NOTE — TOC Progression Note (Addendum)
Transition of Care [facility]) - Progression Note    Patient Details  Name: Vickie Alvarado MRN: [id number] Date of Birth: 1966/05/05  Transition of Care [facility] CM/SW Contact  Beckie Busing, RN Phone Number: [phone]  05/02/2020, 2:16 PM  Clinical Narrative:  Daughter is still attempting to get a virtual tour scheduled. CM has attempted to call [facility] Dragon who is the Admissions director with no answer, voicemail has been left for Marylene Land to call CM in reference to virtual tour for family. CM called daughter Vickie Alvarado to determine if she has made contact with Marylene Land for virtual tour and she has not heard from anyone at this time. CM called Lorrie with [facility] to inform her that CM and family have not been able to make contact with Marylene Land. Lorrie has been given the contact info for daughter and will contact Marylene Land to set up virtual tour.   1500 CM received call from [facility] for [facility]. Marylene Land states that the virtual tour has been uploaded to the facilities website for viewing. Marylene Land states that she will call daughter Vickie Alvarado to make her aware. CM will follow up with daughter.    Expected Discharge Plan: Skilled Nursing Facility Barriers to Discharge: Continued Medical Work up  Expected Discharge Plan and Services Expected Discharge Plan: Skilled Nursing Facility In-house Referral: NA Discharge Planning Services: CM Consult   Living arrangements for the past 2 months: Single Family Home                 DME Arranged: N/A DME Agency: NA       HH Arranged: NA HH Agency: NA         Social Determinants of Health (SDOH) Interventions    Readmission Risk Interventions No flowsheet data found.

## 2020-05-02 NOTE — Progress Notes (Addendum)
Physical Therapy Treatment Patient Details Name: Vickie Alvarado MRN: [id number] DOB: 10-03-1965 Today's Date: 05/02/2020    History of Present Illness 54 y/o F with a history of HTN, HLD, COVID-19 infection (03/01/20), dental infections, and sinus infections presented to [facility] after being found altered with R sided weakness, dysarthria, and R facial droop. Patient had decreased  P.O. intake, decreased appetite, and projectile vomiting in addition to her other symptoms. 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 Neurosurgery performed a L frontal stereotactic biopsy/aspiration of the abscess. 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 ETT 8/28, trach 9/6.    PT Comments    Pt progressed to EOB activity today, tolerating EOB sitting x9 minutes with posterior truncal support. Pt demonstrating significant RUE hypertonicity in flexor synergy, PT attempting to have pt move out of synergy during EOB reaching tasks but difficult. Pt interactive with PT today via eye contact and facial expressions, nods "yes" and smiles when asked if she is proud of herself for sitting EOB. PT to continue to progress mobility as tolerated by pt.     Follow Up Recommendations  SNF;LTACH     Equipment Recommendations  None recommended by PT    Recommendations for Other Services       Precautions / Restrictions Precautions Precautions: Fall Precaution Comments: trach; peg Required Braces or Orthoses: Other Brace (bil PRAFO) Restrictions Weight Bearing Restrictions: No    Mobility  Bed Mobility Overal bed mobility: Needs Assistance Bed Mobility: Rolling;Supine to Sit;Sit to Supine Rolling: Total assist   Supine to sit: Total assist;+2 for physical assistance Sit to supine: Total assist;+2 for physical assistance   General bed mobility comments: total  +2 for rolling bilaterally for pericare, total +2 for supine<>sit for trunk and LE management when moving to/from EOB via helicopter method.  Transfers                    Ambulation/Gait                 Stairs             Wheelchair Mobility    Modified Rankin (Stroke Patients Only) Modified Rankin (Stroke Patients Only) Pre-Morbid Rankin Score: No symptoms Modified Rankin: Severe disability     Balance Overall balance assessment: Needs assistance Sitting-balance support: Single extremity supported Sitting balance-Leahy Scale: Poor Sitting balance - Comments: requires at least min assist to maintain upright sitting, sat EOB x9 minutes with PT facilitating dynamic reaching tasks, LE exercise                                    Cognition Arousal/Alertness: Awake/alert Behavior During Therapy: Flat affect Overall Cognitive Status: Difficult to assess Area of Impairment: Following commands;Awareness;Attention                   Current Attention Level: Focused   Following Commands: Follows one step commands inconsistently;Follows one step commands with increased time   Awareness: Intellectual   General Comments: interactive with eye contact and facial expressions, max multimodal cuing to scan and attend to R.      Exercises General Exercises - Lower Extremity Ankle Circles/Pumps: PROM;Both;10 reps;Supine Long Arc Quad: PROM;Both;Seated;10 reps Heel Slides: PROM;Both;Supine;10 reps Hip ABduction/ADduction: PROM;Both;Supine;10  reps Shoulder Exercises Shoulder Flexion: 5 reps;Supine;PROM;Left (to 90* elevation with PT stabilizing scapula) Elbow Extension: PROM;Right;5 reps;Supine (against max tone)    General Comments        Pertinent Vitals/Pain Pain Assessment: Faces Faces Pain Scale: Hurts a little bit Pain Location: RUE, with ROM Pain Descriptors / Indicators: Guarding;Grimacing Pain Intervention(s): Limited activity  within patient's tolerance;Monitored during session;Repositioned    Home Living                      Prior Function            PT Goals (current goals can now be found in the care plan section) Acute Rehab PT Goals Patient Stated Goal: unable PT Goal Formulation: Patient unable to participate in goal setting Time For Goal Achievement: 05/08/20 Potential to Achieve Goals: Fair Progress towards PT goals: Progressing toward goals    Frequency    Min 3X/week      PT Plan Current plan remains appropriate    Co-evaluation              AM-PAC PT "6 Clicks" Mobility   Outcome Measure  Help needed turning from your back to your side while in a flat bed without using bedrails?: Total Help needed moving from lying on your back to sitting on the side of a flat bed without using bedrails?: Total Help needed moving to and from a bed to a chair (including a wheelchair)?: Total Help needed standing up from a chair using your arms (e.g., wheelchair or bedside chair)?: Total Help needed to walk in hospital room?: Total Help needed climbing 3-5 steps with a railing? : Total 6 Click Score: 6    End of Session Equipment Utilized During Treatment: Oxygen Activity Tolerance: Patient tolerated treatment well Patient left: with call bell/phone within reach;in bed;Other (comment) (prevalon boots donned) Nurse Communication: Mobility status PT Visit Diagnosis: Other abnormalities of gait and mobility (R26.89);Muscle weakness (generalized) (M62.81);Other symptoms and signs involving the nervous system (R29.898) Hemiplegia - Right/Left: Right Hemiplegia - dominant/non-dominant: Dominant Hemiplegia - caused by: Other cerebrovascular disease     Time: 1400-1435 PT Time Calculation (min) (ACUTE ONLY): 35 min  Charges:  $Therapeutic Exercise: 8-22 mins $Neuromuscular Re-education: 8-22 mins                     Adi Doro E, PT Acute Rehabilitation Services Pager [phone]  Office  [phone]    Nicklaus Alviar D Stephfon Bovey 05/02/2020, 4:09 PM

## 2020-05-02 NOTE — Plan of Care (Signed)
  Problem: Clinical Measurements: Goal: Will remain free from infection Outcome: Progressing Goal: Diagnostic test results will improve Outcome: Progressing Goal: Cardiovascular complication will be avoided Outcome: Progressing   Problem: Activity: Goal: Risk for activity intolerance will decrease Outcome: Progressing   Problem: Nutrition: Goal: Adequate nutrition will be maintained Outcome: Progressing   Problem: Coping: Goal: Level of anxiety will decrease Outcome: Progressing   Problem: Elimination: Goal: Will not experience complications related to bowel motility Outcome: Progressing Goal: Will not experience complications related to urinary retention Outcome: Progressing   Problem: Pain Managment: Goal: General experience of comfort will improve Outcome: Progressing   Problem: Safety: Goal: Ability to remain free from injury will improve Outcome: Progressing   Problem: Skin Integrity: Goal: Risk for impaired skin integrity will decrease Outcome: Progressing   Problem: Activity: Goal: Ability to tolerate increased activity will improve Outcome: Progressing   Problem: Respiratory: Goal: Ability to maintain a clear airway and adequate ventilation will improve Outcome: Progressing

## 2020-05-03 DIAGNOSIS — Z23 Encounter for immunization: Secondary | ICD-10-CM | POA: Diagnosis present

## 2020-05-03 LAB — GLUCOSE, CAPILLARY
Glucose-Capillary: 107 mg/dL — ABNORMAL HIGH (ref 70–99)
Glucose-Capillary: 110 mg/dL — ABNORMAL HIGH (ref 70–99)
Glucose-Capillary: 113 mg/dL — ABNORMAL HIGH (ref 70–99)
Glucose-Capillary: 137 mg/dL — ABNORMAL HIGH (ref 70–99)
Glucose-Capillary: 93 mg/dL (ref 70–99)
Glucose-Capillary: 97 mg/dL (ref 70–99)

## 2020-05-03 MED ORDER — JEVITY 1.5 CAL/FIBER PO LIQD: 1000.0000 mL | ORAL | 0 refills | Status: AC

## 2020-05-03 MED ORDER — LEVETIRACETAM 100 MG/ML PO SOLN: 500.0000 mg | Freq: Two times a day (BID) | ORAL | 12 refills | Status: AC

## 2020-05-03 MED ORDER — INSULIN GLARGINE 100 UNIT/ML ~~LOC~~ SOLN: 20.0000 [IU] | Freq: Every day | SUBCUTANEOUS | 11 refills | Status: AC

## 2020-05-03 MED ORDER — PROMETHAZINE HCL 12.5 MG PO TABS: 12.5000 mg | ORAL_TABLET | ORAL | 0 refills | Status: AC | PRN

## 2020-05-03 MED ORDER — POLYETHYLENE GLYCOL 3350 17 G PO PACK: 17.0000 g | PACK | Freq: Every day | ORAL | 0 refills | Status: AC | PRN

## 2020-05-03 MED ORDER — FOLIC ACID 1 MG PO TABS: 1.0000 mg | ORAL_TABLET | Freq: Every day | ORAL | 0 refills | Status: AC

## 2020-05-03 MED ORDER — PANTOPRAZOLE SODIUM 40 MG PO PACK: 40.0000 mg | PACK | Freq: Every day | ORAL | 0 refills | Status: AC

## 2020-05-03 MED ORDER — CEFTRIAXONE IV (FOR PTA / DISCHARGE USE ONLY): 2.0000 g | Freq: Two times a day (BID) | INTRAVENOUS | 0 refills | Status: AC

## 2020-05-03 MED ORDER — DIPHENOXYLATE-ATROPINE 2.5-0.025 MG/5ML PO LIQD: 10.0000 mL | Freq: Four times a day (QID) | ORAL | 0 refills | Status: AC | PRN

## 2020-05-03 MED ORDER — PROSOURCE TF PO LIQD: 45.0000 mL | Freq: Three times a day (TID) | ORAL | 0 refills | Status: AC

## 2020-05-03 MED ORDER — ACETAMINOPHEN 160 MG/5ML PO SOLN: 650.0000 mg | ORAL | 0 refills | Status: AC | PRN

## 2020-05-03 MED ORDER — JUVEN PO PACK: 1.0000 | PACK | Freq: Two times a day (BID) | ORAL | 0 refills | Status: AC

## 2020-05-03 MED ORDER — OXYCODONE-ACETAMINOPHEN 5-325 MG PO TABS
1.0000 | ORAL_TABLET | Freq: Four times a day (QID) | ORAL | 0 refills | Status: AC | PRN
Start: 2020-05-03 — End: 2020-05-08

## 2020-05-03 MED ORDER — INSULIN ASPART 100 UNIT/ML ~~LOC~~ SOLN: 3.0000 [IU] | Freq: Three times a day (TID) | SUBCUTANEOUS | 0 refills | Status: AC

## 2020-05-03 MED ORDER — DIPHENOXYLATE-ATROPINE 2.5-0.025 MG/5ML PO LIQD
10.0000 mL | Freq: Four times a day (QID) | ORAL | Status: DC | PRN
  Administered 2020-05-03: 10 mL
  Filled 2020-05-03 (×2): qty 10

## 2020-05-03 NOTE — Progress Notes (Signed)
Attempted to call report to [employer] *2.No answer/nor voicemail available. Room # 122 Phone number [phone].

## 2020-05-03 NOTE — Discharge Summary (Signed)
Physician Discharge Summary  Ceria Suminski MGQ:[id number] DOB: 1966-03-24 DOA: 03/20/2020  PCP: [facility]  Admit date: 03/20/2020 Discharge date: 05/03/2020  Admitted From: Home Disposition: SNF  Recommendations for Outpatient Follow-up:  1. Follow up with PCP in 1-2 weeks 2. Follow-up with neurology as scheduled  Discharge Condition: Guarded CODE STATUS: Full Diet recommendation: N.p.o. with tube feeds and supplements as below  Brief/Interim Summary: Galvin Proffer a 54 y.o.year old femalewith medical history significant for HTN, HLD, COVID-19 infection (diagnosed 03/01/2020), dental infection and sinus infections.  Timeline of events:  8/25, patient presentedwith right-sided weakness, dysarthria and right facial droop also to have abscess in the left basal ganglia. Patient was admitted to the hospital.  8/27, underwent stereotactic biopsy and aspiration of the abscess by neurosurgery and was transferred to ICU. Wound culture resulted Streptococcus intermedius  8/27, MRI brain was obtained for worsening mental status.  It showed new large left PCA infarct along with scattered infarcts in the right basal ganglia and right thalamus concerning for either CVA related to septic emboli versus herniation syndrome. Neurosurgery believed patient developed the new development of the posterior cerebral artery distribution infarct due to to sequelae from herniation. Given her large hemisphere brain abscess with surrounding edema/cerebritis neurosurgery did not recommend decompressive surgery.  Patient was placed on a tapering course of Decadron which she completed by now.  8/28, PICC line placed  9/6, tracheostomy  9/9, PEG placement and tube feeding started  9/13, repeat CT head showed improvement in fluid collections blood showed persistent left parietal/occipital acute/subacute infarcts with improvement in midline shift  9/27-28 low grade (100.4) fever x2 -  resolved - no acute indication for change in management - loose stool noted - laxatives stopped with resolution of soft BM  Continues to progress slowly with PT but is improving daily - continues to be interactive (head nod/shake for yes/no and can follow simple commands)  10/7 able to sit at end of bed - continues to be more interactive daily  At this time patient is markedly improving, tolerating tube feeds well, glucose well controlled as are electrolytes and blood pressure.  No recurrent seizures, continues to be treated with IV ceftriaxone through 05/17/2020 for infection related to cerebral abscess showing Streptococcus intermedius.  Patient continues to advance slowly with physical therapy, given her needs patient was discussed with family and deemed appropriate for placement at SNF for ongoing physical therapy medical management.  At this time patient has transition from hydrotherapy to wet to dry dressings as indicated by wound care for her sacral decubitus ulcer.  At this time patient is otherwise stable and family is agreeable for discharge to SNF for ongoing medical care, supportive care, IV antibiotics.  Family is hopeful the patient will be able to return home with improving function, we discussed that this will likely be a prolonged course as patient attempts to improve her baseline functioning.  Discharge Diagnoses:  Principal Problem:   Brain abscess Active Problems:   Weakness of extremity   Essential hypertension   Hyperlipidemia   Anxiety   Bipolar disorder (HCC)   Cerebral embolism with cerebral infarction   Cerebral abscess   Cerebritis   Abdominal distension   Palliative care by specialist   Goals of care, counseling/discussion   Altered mental status   Adult failure to thrive    Discharge Instructions  Discharge Instructions    [facility] pharmacist to adjust dose for Vancomycin, Aminoglycosides and other anti-infective therapies as  requested by  physician.   Complete by: As directed    [facility] to provide Cath Flo 28m   Complete by: As directed    Administer for PICC line occlusion and as ordered by physician for other access device issues.   Anaphylaxis Kit: Provided to treat any anaphylactic reaction to the medication being provided to the patient if First Dose or when requested by physician   Complete by: As directed    Epinephrine 178mml vial / amp: Administer 0.27m20m0.27ml88mubcutaneously once for moderate to severe anaphylaxis, nurse to call physician and pharmacy when reaction occurs and call 911 if needed for immediate care   Diphenhydramine 50mg627mIV vial: Administer 25-50mg 110mM PRN for first dose reaction, rash, itching, mild reaction, nurse to call physician and pharmacy when reaction occurs   Sodium Chloride 0.9% NS 500ml I69mdminister if needed for hypovolemic blood pressure drop or as ordered by physician after call to physician with anaphylactic reaction   Call MD for:  redness, tenderness, or signs of infection (pain, swelling, redness, odor or green/yellow discharge around incision site)   Complete by: As directed    Call MD for:  severe uncontrolled pain   Complete by: As directed    Call MD for:  temperature >100.4   Complete by: As directed    Change dressing on IV access line weekly and PRN   Complete by: As directed    Discharge wound care:   Complete by: As directed    Wound care  Every shift      Comments: Insert saline moistened gauze into sacral wound and cover with ABD pad. Change twice daily or PRN soiling   Flush IV access with Sodium Chloride 0.9% and Heparin 10 units/ml or 100 units/ml   Complete by: As directed    Home infusion instructions - [facility]   Complete by: As directed    Instructions: Flush IV access with Sodium Chloride 0.9% and Heparin 10units/ml or 100units/ml   Change dressing on IV access line: Weekly and PRN   Instructions Cath Flo 2mg: Ad39mister for  PICC Line occlusion and as ordered by physician for other access device   [facility] pharmacist to adjust dose for: Vancomycin, Aminoglycosides and other anti-infective therapies as requested by physician   Increase activity slowly   Complete by: As directed    Method of administration may be changed at the discretion of home infusion pharmacist based upon assessment of the patient and/or caregiver's ability to self-administer the medication ordered   Complete by: As directed    Outpatient Parenteral Antibiotic Therapy Information Antibiotic: Ceftriaxone (Rocephin) IVPB; Indications for use: abscess; End Date: 05/17/2020   Complete by: As directed    Antibiotic: Ceftriaxone (Rocephin) IVPB   Indications for use: abscess   End Date: 05/17/2020     Allergies as of 05/03/2020      Reactions   Ibuprofen Anaphylaxis      Medication List    STOP taking these medications   acetaminophen 500 MG tablet Commonly known as: TYLENOL Replaced by: acetaminophen 160 MG/5ML solution   amoxicillin 500 MG capsule Commonly known as: AMOXIL   benztropine 1 MG tablet Commonly known as: COGENTIN   cyclobenzaprine 10 MG tablet Commonly known as: FLEXERIL   gabapentin 400 MG capsule Commonly known as: NEURONTIN   omeprazole 20 MG capsule Commonly known as: PRILOSEC   QUEtiapine 25 MG tablet Commonly known as: SEROQUEL   QUEtiapine 400 MG tablet Commonly known  as: SEROQUEL     TAKE these medications   acetaminophen 160 MG/5ML solution Commonly known as: TYLENOL Place 20.3 mLs (650 mg total) into feeding tube every 4 (four) hours as needed for mild pain (temp >100.5). Replaces: acetaminophen 500 MG tablet   cefTRIAXone  IVPB Commonly known as: ROCEPHIN Inject 2 g into the vein every 12 (twelve) hours. Indication:  Brain abscess First Dose: Yes Last Day of Therapy:  05/17/2020 Labs - Once weekly:  CBC/D and BMP, Labs - Every other week:  ESR and CRP Method of  administration: IV Push Method of administration may be changed at the discretion of home infusion pharmacist based upon assessment of the patient and/or caregiver's ability to self-administer the medication ordered.   feeding supplement (JEVITY 1.5 CAL/FIBER) Liqd Place 1,000 mLs into feeding tube continuous.   feeding supplement (PROSource TF) liquid Place 45 mLs into feeding tube 3 (three) times daily.   nutrition supplement (JUVEN) Pack Place 1 packet into feeding tube 2 (two) times daily between meals.   folic acid 1 MG tablet Commonly known as: FOLVITE Place 1 tablet (1 mg total) into feeding tube daily. Start taking on: May 04, 2020   insulin aspart 100 UNIT/ML injection Commonly known as: novoLOG Inject 3 Units into the skin 3 (three) times daily with meals.   insulin glargine 100 UNIT/ML injection Commonly known as: LANTUS Inject 0.2 mLs (20 Units total) into the skin daily. Start taking on: May 04, 2020   levETIRAcetam 100 MG/ML solution Commonly known as: KEPPRA Place 5 mLs (500 mg total) into feeding tube 2 (two) times daily.   oxyCODONE-acetaminophen 5-325 MG tablet Commonly known as: PERCOCET/ROXICET Place 1 tablet into feeding tube every 6 (six) hours as needed for up to 5 days for moderate pain.   pantoprazole sodium 40 mg/20 mL Pack Commonly known as: PROTONIX Place 20 mLs (40 mg total) into feeding tube at bedtime.   polyethylene glycol 17 g packet Commonly known as: MIRALAX / GLYCOLAX Place 17 g into feeding tube daily as needed for mild constipation.   promethazine 12.5 MG tablet Commonly known as: PHENERGAN Place 1-2 tablets (12.5-25 mg total) into feeding tube every 4 (four) hours as needed for refractory nausea / vomiting.            Discharge Care Instructions  (From admission, onward)         Start     Ordered   05/03/20 0000  Change dressing on IV access line weekly and PRN  (Home infusion instructions - [facility] )         05/03/20 1109   05/03/20 0000  Discharge wound care:       Comments: Wound care  Every shift      Comments: Insert saline moistened gauze into sacral wound and cover with ABD pad. Change twice daily or PRN soiling   05/03/20 1109          Contact information for after-discharge care    Destination    [facility] .   Service: Skilled Nursing Contact information: [place] [address] [phone]                 Allergies  Allergen Reactions  . Ibuprofen Anaphylaxis    Consultations:  Neurology, PCCM, palliative care, infectious disease, neurosurgery.   Procedures/Studies: DG Abd 1 View  Result Date: 04/11/2020 CLINICAL DATA:  Abdominal distension. EXAM: ABDOMEN - 1 VIEW COMPARISON:  04/10/2020 FINDINGS:  Gastrostomy projects over the left upper abdomen. Nonobstructive bowel gas pattern. Moderate stool in the right colon. Gas within the sigmoid colon with decreased distention since prior study. No organomegaly or free air. IMPRESSION: Continued large stool burden in the right colon. No evidence of bowel obstruction. Electronically Signed   By: Rolm Baptise M.D.   On: 04/11/2020 09:26   DG Abd 1 View  Result Date: 04/10/2020 CLINICAL DATA:  Abdominal distension EXAM: ABDOMEN - 1 VIEW COMPARISON:  CT 12/19/2019 FINDINGS: Large proximal colonic stool burden with air distention of the distal colonic segments and some mild thickening of the haustra distally. An air distended loop seen in the left lower quadrant is present with lack of air and stool over the rectal vault. Percutaneous feeding tube projects over left upper abdomen. Atelectatic changes in the lung bases. No suspicious calcifications. Mild degenerative changes in the spine and pelvis. IMPRESSION: 1. Large proximal colonic stool burden with air distention of the distal colonic segments and some suspect thickening of the haustra distally. Air distended loop seen in the  left lower quadrant may reflect a distended sigmoid with lack of air and stool over the rectal vault. Features could reflect constipation, though early distal colonic obstruction/sigmoid volvulus could present with a similar appearance. 2. Percutaneous gastrostomy in the left upper quadrant. Electronically Signed   By: Lovena Le M.D.   On: 04/10/2020 15:57   CT HEAD WO CONTRAST  Result Date: 04/08/2020 CLINICAL DATA:  Stroke. Cerebritis. Cerebral abscess. Left-sided ptosis. EXAM: CT HEAD WITHOUT CONTRAST TECHNIQUE: Contiguous axial images were obtained from the base of the skull through the vertex without intravenous contrast. COMPARISON:  MR head without and with contrast 03/24/2020. CT head without contrast 03/26/2020. FINDINGS: Brain: Previously noted fluid collections in the left operculum have significantly improved. No fluid levels or gas collections are present. Attenuation of the left lentiform nucleus is restored. Persistent hypoattenuation is present throughout the left parietal and occipital white matter. Left occipital cortical infarcts are noted. Midline shift of 2-3 mm is improved. Vascular: Atherosclerotic calcifications are present within the cavernous internal carotid arteries bilaterally. No hyperdense vessel is present. Skull: Left frontal burr hole is noted. Calvarium is otherwise intact. No significant extracranial soft tissue lesion is present. Sinuses/Orbits: Chronic right maxillary sinus specification is present. Bilateral mastoid effusions are present, left greater than right. Middle ear cavity is clear. No obstruction is present. Sinuses are otherwise clear. The globes and orbits are within normal limits. IMPRESSION: 1. Significantly improved fluid collections in the left operculum. No fluid levels or gas collections are present. 2. Persistent hypoattenuation throughout the left parietal and occipital white matter compatible with acute/subacute infarcts. 3. Left occipital cortical  infarcts. 4. Improved midline shift of 2-3 mm. 5. Bilateral mastoid effusions, left greater than right. No obstruction is present. 6. Chronic right maxillary sinus disease. Electronically Signed   By: San Morelle M.D.   On: 04/08/2020 14:48   MR BRAIN W WO CONTRAST  Result Date: 04/15/2020 CLINICAL DATA:  Brain abscess EXAM: MRI HEAD WITHOUT AND WITH CONTRAST TECHNIQUE: Multiplanar, multiecho pulse sequences of the brain and surrounding structures were obtained without and with intravenous contrast. CONTRAST:  75m GADAVIST GADOBUTROL 1 MMOL/ML IV SOLN COMPARISON:  03/24/2020 MRI/MRA head and prior.  04/08/2020 head CT. FINDINGS: Brain: Decreased size of thick-walled peripherally enhancing left basal ganglia fluid collection with marked internal restricted diffusion measuring 2.8 x 1.5 cm (10: 26). Confluent white matter edema involving the left cerebrum is decreased from prior  exam. Partial effacement of the left lateral ventricle without midline shift. No ventriculomegaly or extra-axial fluid collection. New enhancement is seen along the left frontal aspiration track. Subacute infarcts involving the peripheral right frontal lobe, bilateral thalami, dorsal corpus callosum and left midbrain/pons. Some of these insults are better demonstrated on prior exam. Serpiginous cortically based enhancement involving the left parietal, occipital and temporal lobes likely reflects cortical laminar necrosis, consistent with subacute insult. No new infarct. Vascular: Major intracranial flow voids are grossly preserved. Skull and upper cervical spine: Normal marrow signal. Sinuses/Orbits: Normal orbits. Mild frontal, ethmoid and right maxillary sinus mucosal thickening. Layering right maxillary secretions reflects active inflammation. Bilateral mastoid and petrous apex effusions. Other: None. 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 New enhancement is seen along the left frontal aspiration  tract. Sequela of subacute infarcts involving the right frontal lobe, bilateral thalami, corpus callosum and left midbrain/pons. Left parietooccipital and temporal cortical laminar necrosis, sequela of prior infarct. Bilateral mastoid and petrous apex effusions. Mild sinus disease with active right maxillary sinus inflammation. Electronically Signed   By: Primitivo Gauze M.D.   On: 04/15/2020 11:12   IR GASTROSTOMY TUBE MOD SED  Result Date: 04/04/2020 INDICATION: Dysphagia. Please perform percutaneous gastrostomy tube for enteric nutrition supplementation purposes. EXAM: PULL TROUGH GASTROSTOMY TUBE PLACEMENT COMPARISON:  CT abdomen and pelvis-03/20/2020 MEDICATIONS: Ancef 2 gm IV; Antibiotics were administered within 1 hour of the procedure. Glucagon 1 mg IV CONTRAST:  20 mL of Omnipaque 300 administered into the gastric lumen. ANESTHESIA/SEDATION: Moderate (conscious) sedation was employed during this procedure. A total of Versed 1 mg and Fentanyl 50 mcg was administered intravenously. Moderate Sedation Time: 20 minutes. The patient's level of consciousness and vital signs were monitored continuously by radiology nursing throughout the procedure under my direct supervision. FLUOROSCOPY TIME:  1 minutes, 12 seconds (5 mGy) COMPLICATIONS: None immediate. PROCEDURE: Informed written consent was obtained from the patient's family following explanation of the procedure, risks, benefits and alternatives. A time out was performed prior to the initiation of the procedure. Ultrasound scanning was performed to demarcate the edge of the left lobe of the liver. Maximal barrier sterile technique utilized including caps, mask, sterile gowns, sterile gloves, large sterile drape, hand hygiene and Betadine prep. The left upper quadrant was sterilely prepped and draped. An oral gastric catheter was inserted into the stomach under fluoroscopy. The existing nasogastric feeding tube was removed. The left costal margin and air  opacified transverse colon were identified and avoided. Air was injected into the stomach for insufflation and visualization under fluoroscopy. Under sterile conditions a 17 gauge trocar needle was utilized to access the stomach percutaneously beneath the left subcostal margin after the overlying soft tissues were anesthetized with 1% Lidocaine with epinephrine. Needle position was confirmed within the stomach with aspiration of air and injection of small amount of contrast. A single T tack was deployed for gastropexy. Over an Amplatz guide wire, a 9-French sheath was inserted into the stomach. A snare device was utilized to capture the oral gastric catheter. The snare device was pulled retrograde from the stomach up the esophagus and out the oropharynx. The 20-French pull-through gastrostomy was connected to the snare device and pulled antegrade through the oropharynx down the esophagus into the stomach and then through the percutaneous tract external to the patient. The gastrostomy was assembled externally. Contrast injection confirms position in the stomach. Several spot radiographic images were obtained in various obliquities for documentation. The patient tolerated procedure well without  immediate post procedural complication. FINDINGS: After successful fluoroscopic guided placement, the gastrostomy tube is appropriately positioned with internal disc against the ventral aspect of the gastric lumen. IMPRESSION: Successful fluoroscopic insertion of a 20-French pull-through gastrostomy tube. The gastrostomy may be used immediately for medication administration and in 24 hrs for the initiation of feeds. Electronically Signed   By: Sandi Mariscal M.D.   On: 04/04/2020 14:48   DG Abd Portable 1V  Result Date: 04/16/2020 CLINICAL DATA:  Distension EXAM: PORTABLE ABDOMEN - 1 VIEW COMPARISON:  the previous day's study FINDINGS: Gastrostomy tube in place. The small bowel is nondilated. There is mild gaseous distention  of the colon as before, without dilatation. Stable left pelvic phlebolith. Regional bones unremarkable. IMPRESSION: Little change in mild gaseous distention of the colon. Electronically Signed   By: Lucrezia Europe M.D.   On: 04/16/2020 08:31   DG Abd Portable 1V  Result Date: 04/15/2020 CLINICAL DATA:  Abdominal distension. EXAM: PORTABLE ABDOMEN - 1 VIEW COMPARISON:  04/14/2020 and FINDINGS: Persistent gaseous distention of the colon likely colonic ileus/inertia. Feeding gastrostomy tube is noted in unchanged position. IMPRESSION: Persistent gaseous distention of the colon likely colonic ileus/inertia. Electronically Signed   By: Marijo Sanes M.D.   On: 04/15/2020 14:16   DG Abd Portable 1V  Result Date: 04/14/2020 CLINICAL DATA:  Evaluate for stool within the right colon. EXAM: PORTABLE ABDOMEN - 1 VIEW COMPARISON:  04/13/2020 FINDINGS: Gastrostomy tube is again noted projecting over the left upper abdomen. Similar volume of stool noted within the right colon. Gaseous distension of the transverse colon and splenic flexure is again identified with intraluminal gas up to the level of the rectum. IMPRESSION: 1. Similar volume of stool within the right colon. 2. Persistent gaseous distension of the transverse colon and splenic flexure. Electronically Signed   By: Kerby Moors M.D.   On: 04/14/2020 08:44   DG Abd Portable 1V  Result Date: 04/13/2020 CLINICAL DATA:  54 year old female with a history of abdominal distension EXAM: PORTABLE ABDOMEN - 1 VIEW COMPARISON:  04/12/2020, 04/11/2020 FINDINGS: Formed stool within the right colon. Gas-filled transverse colon and sigmoid colon extending to the rectum. No transition point. No abnormally distended small bowel. Gastric tube projects over the upper abdomen. Unremarkable skeletal structures IMPRESSION: Similar appearance of the x-ray with right-sided stool burden and gas filled colon. No evidence of obstruction. Electronically Signed   By: Corrie Mckusick D.O.    On: 04/13/2020 11:18   DG Abd Portable 1V  Result Date: 04/12/2020 CLINICAL DATA:  Abdominal distention EXAM: PORTABLE ABDOMEN - 1 VIEW COMPARISON:  04/11/2020 FINDINGS: Gaseous distention of the colon noted. Moderate stool burden particularly in the right colon. Findings are similar to prior study. No organomegaly or free air. IMPRESSION: Moderate stool burden in the right colon with mild gaseous distention of the colon. No change. Electronically Signed   By: Rolm Baptise M.D.   On: 04/12/2020 08:40      Subjective: No acute issues or events overnight, patient continues to be minimally interactive able to shake her head yes or no, follows simple commands, currently working with physical therapy attempting to sit up at bedside with moderate success.  Review of systems otherwise markedly limited.   Discharge Exam: Vitals:   05/03/20 0832 05/03/20 1101  BP: 123/75 120/75  Pulse: 85 (!) 103  Resp:  16  Temp: 98.8 F (37.1 C)   SpO2: 96% 98%   Vitals:   05/03/20 0328 05/03/20 0810 05/03/20 0832 05/03/20  1101  BP:   123/75 120/75  Pulse: 88 92 85 (!) 103  Resp: 18 18  16   Temp:   98.8 F (37.1 C)   TempSrc:   Axillary   SpO2: 98% 96% 96% 98%  Weight:      Height:        General: Resting comfortably in bed, minimally interactive, following commands today able to shake head yes/no and close eyes to command. HEENT: Tracks movements today - opens eyes spontaneous to voice.   Neck:  Without mass or deformity. Tracheostomy clean dry intact, minimal sputum Lungs:  Clear to auscultate bilaterally without rhonchi, wheeze, or rales. Heart:  Regular rate and rhythm.  Without murmurs, rubs, or gallops. Abdomen: PEG tube bandage clean dry intact soft, nontender, nondistended.  Without guarding or rebound.  Foley catheter in place. Extremities: Without cyanosis, clubbing, edema, or obvious deformity. Vascular:  Dorsalis pedis and posterior tibial pulses palpable bilaterally.  Right proximal  arm double-lumen PICC bandage clean dry intact Skin:  Warm and dry, no erythema, stage 4 coccygeal ulcer (defer to wound care notes for further description)   The results of significant diagnostics from this hospitalization (including imaging, microbiology, ancillary and laboratory) are listed below for reference.     Microbiology: Recent Results (from the past 240 hour(s))  SARS Coronavirus 2 by RT PCR (hospital order, performed in [facility] hospital lab) Nasopharyngeal Nasopharyngeal Swab     Status: None   Collection Time: 04/30/20  3:48 PM   Specimen: Nasopharyngeal Swab  Result Value Ref Range Status   SARS Coronavirus 2 NEGATIVE NEGATIVE Final    Comment: (NOTE) SARS-CoV-2 target nucleic acids are NOT DETECTED.  The SARS-CoV-2 RNA is generally detectable in upper and lower respiratory specimens during the acute phase of infection. The lowest concentration of SARS-CoV-2 viral copies this assay can detect is 250 copies / mL. A negative result does not preclude SARS-CoV-2 infection and should not be used as the sole basis for treatment or other patient management decisions.  A negative result may occur with improper specimen collection / handling, submission of specimen other than nasopharyngeal swab, presence of viral mutation(s) within the areas targeted by this assay, and inadequate number of viral copies (<250 copies / mL). A negative result must be combined with clinical observations, patient history, and epidemiological information.  Fact Sheet for Patients:   StrictlyIdeas.no  Fact Sheet for Healthcare Providers: BankingDealers.co.za  This test is not yet approved or  cleared by the [place] FDA and has been authorized for detection and/or diagnosis of SARS-CoV-2 by FDA under an Emergency Use Authorization (EUA).  This EUA will remain in effect (meaning this test can be used) for the duration of the COVID-19  declaration under Section 564(b)(1) of the Act, 21 U.S.C. section 360bbb-3(b)(1), unless the authorization is terminated or revoked sooner.  Performed at [facility], [place] [address], [place], [place] [address]      Labs: BNP (last 3 results) No results for input(s): BNP in the last 8760 hours. Basic Metabolic Panel: Recent Labs  Lab 04/29/20 0552 05/02/20 0417  NA 139 140  K 3.2* 3.3*  CL 102 102  CO2 29 28  GLUCOSE 130* 108*  BUN 10 11  CREATININE 0.38* 0.42*  CALCIUM 8.4* 8.3*   Liver Function Tests: Recent Labs  Lab 04/29/20 0552 05/02/20 0417  AST 20 24  ALT 40 40  ALKPHOS 114 110  BILITOT 0.4 0.3  PROT 5.1* 5.2*  ALBUMIN 2.3*  2.3*   No results for input(s): LIPASE, AMYLASE in the last 168 hours. No results for input(s): AMMONIA in the last 168 hours. CBC: Recent Labs  Lab 04/29/20 0552 05/02/20 0417  WBC 9.1 9.7  HGB 10.7* 10.9*  HCT 33.3* 33.3*  MCV 90.0 91.2  PLT 315 324   Cardiac Enzymes: No results for input(s): CKTOTAL, CKMB, CKMBINDEX, TROPONINI in the last 168 hours. BNP: Invalid input(s): POCBNP CBG: Recent Labs  Lab 05/02/20 1631 05/02/20 2003 05/03/20 0000 05/03/20 0447 05/03/20 0831  GLUCAP 87 120* 107* 137* 113*   D-Dimer No results for input(s): DDIMER in the last 72 hours. Hgb A1c No results for input(s): HGBA1C in the last 72 hours. Lipid Profile No results for input(s): CHOL, HDL, LDLCALC, TRIG, CHOLHDL, LDLDIRECT in the last 72 hours. Thyroid function studies No results for input(s): TSH, T4TOTAL, T3FREE, THYROIDAB in the last 72 hours.  Invalid input(s): FREET3 Anemia work up No results for input(s): VITAMINB12, FOLATE, FERRITIN, TIBC, IRON, RETICCTPCT in the last 72 hours. Urinalysis    Component Value Date/Time   COLORURINE AMBER (A) 03/20/2020 1630   APPEARANCEUR CLOUDY (A) 03/20/2020 1630   LABSPEC >1.046 (H) 03/20/2020 1630   PHURINE 6.0 03/20/2020 1630   GLUCOSEU 50 (A) 03/20/2020 1630   HGBUR  NEGATIVE 03/20/2020 1630   BILIRUBINUR NEGATIVE 03/20/2020 1630   KETONESUR NEGATIVE 03/20/2020 1630   PROTEINUR 100 (A) 03/20/2020 1630   NITRITE NEGATIVE 03/20/2020 1630   LEUKOCYTESUR LARGE (A) 03/20/2020 1630   Sepsis Labs Invalid input(s): PROCALCITONIN,  WBC,  LACTICIDVEN Microbiology Recent Results (from the past 240 hour(s))  SARS Coronavirus 2 by RT PCR (hospital order, performed in [place] hospital lab) Nasopharyngeal Nasopharyngeal Swab     Status: None   Collection Time: 04/30/20  3:48 PM   Specimen: Nasopharyngeal Swab  Result Value Ref Range Status   SARS Coronavirus 2 NEGATIVE NEGATIVE Final    Comment: (NOTE) SARS-CoV-2 target nucleic acids are NOT DETECTED.  The SARS-CoV-2 RNA is generally detectable in upper and lower respiratory specimens during the acute phase of infection. The lowest concentration of SARS-CoV-2 viral copies this assay can detect is 250 copies / mL. A negative result does not preclude SARS-CoV-2 infection and should not be used as the sole basis for treatment or other patient management decisions.  A negative result may occur with improper specimen collection / handling, submission of specimen other than nasopharyngeal swab, presence of viral mutation(s) within the areas targeted by this assay, and inadequate number of viral copies (<250 copies / mL). A negative result must be combined with clinical observations, patient history, and epidemiological information.  Fact Sheet for Patients:   StrictlyIdeas.no  Fact Sheet for Healthcare Providers: BankingDealers.co.za  This test is not yet approved or  cleared by the [place] FDA and has been authorized for detection and/or diagnosis of SARS-CoV-2 by FDA under an Emergency Use Authorization (EUA).  This EUA will remain in effect (meaning this test can be used) for the duration of the COVID-19 declaration under Section 564(b)(1) of the Act,  21 U.S.C. section 360bbb-3(b)(1), unless the authorization is terminated or revoked sooner.  Performed at [facility], [place] [address], [place], [place] [address]      Time coordinating discharge: Over 30 minutes  SIGNED:   Little Ishikawa, DO [facility] 05/03/2020, 11:20 AM Pager   If 7PM-7AM, please contact night-coverage [email or web address]

## 2020-05-03 NOTE — TOC Progression Note (Addendum)
Transition of Care [facility]) - Progression Note    Patient Details  Name: Vickie Alvarado MRN: [id number] Date of Birth: [birth year]  [facility] CM/SW Contact  Beckie Busing, RN Phone Number: [phone]  05/03/2020, 10:09 AM  Clinical Narrative:    CM called daughter  Vickie Alvarado to inquire if family was able to view virtual tour. Daughter confirms that she was able to view virtual tour and the family is agreeable to discharge to [facility]. Message has been sent to MD and CM has verified with Lorrie @ [facility] that a bed is still available.    1410 CM is still awaiting notification from [facility] for bed. Previous message was to hold until Lorrie contacts CM due to center waiting for room changes and cleaning. CM has just [employer] and is waiting on response.    Expected Discharge Plan: Skilled Nursing Facility Barriers to Discharge: Continued Medical Work up  Expected Discharge Plan and Services Expected Discharge Plan: Skilled Nursing Facility In-house Referral: NA Discharge Planning Services: CM Consult   Living arrangements for the past 2 months: Single Family Home                 DME Arranged: N/A DME Agency: NA       HH Arranged: NA HH Agency: NA         Social Determinants of Health (SDOH) Interventions    Readmission Risk Interventions No flowsheet data found.

## 2020-05-03 NOTE — Progress Notes (Signed)
Occupational Therapy Treatment Patient Details Name: Vickie Alvarado MRN: [id number] DOB: 1965/07/31 Today's Date: 05/03/2020    History of present illness 54 y/o F with a history of HTN, HLD, COVID-19 infection (03/01/20), dental infections, and sinus infections presented to [facility] after being found altered with R sided weakness, dysarthria, and R facial droop. Patient had decreased  P.O. intake, decreased appetite, and projectile vomiting in addition to her other symptoms. 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 Neurosurgery performed a L frontal stereotactic biopsy/aspiration of the abscess. 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 ETT 8/28, trach 9/6.   OT comments  Pt has made steady progress with increased level of arousal and ability to participate with OT. Facilitation used to initiate rolling toward R side. With verbal and tactile cues, pt able to raise head, look R and after L scapula protracted, pt able to initiate roll. Pt assisted with pushing into seated position. Good head control in sitting with posterior bias however pt attempting to right self at times. Once midline achieved, pt assisted with wiping mouth and completing oral care using toothette. Pt nodding head at times and smiled at joke. L gaze preference however able to maintain midline visual attention for longer periods of time. +2 total A with attempt at sit - stand with pt initiiitng trunk and head extension. VSS throughout session on TC.  Recommend continued extensive rehab at [facility]. Daughter updated on progress with OT at end of session. Goals updated.   Follow Up Recommendations  SNF;Supervision/Assistance - 24 hour    Equipment Recommendations  Wheelchair (measurements OT);Hospital bed;Wheelchair cushion (measurements OT);Other (comment)    Recommendations for Other  Services      Precautions / Restrictions Precautions Precautions: Fall Precaution Comments: trach; peg (sacral wound) Required Braces or Orthoses: Other Brace Other Brace: B PRAFO       Mobility Bed Mobility Overal bed mobility: Needs Assistance Bed Mobility: Rolling;Sidelying to Sit;Sit to Sidelying Rolling: Total assist Sidelying to sit: Total assist;+2 for physical assistance     Sit to sidelying: Total assist;+2 for physical assistance General bed mobility comments: Facilitation with pt looking toward R then after reaching behind L scapula to initiate roll x 3, pt began to initiate after repetition. Note initiation of lifting head and protracitng LUE in attempt to roll. Pt lifting LLE on command. Appears to help push up through RUE.   Transfers Overall transfer level: Needs assistance   Transfers: Sit to/from Stand Sit to Stand: Total assist;+2 physical assistance         General transfer comment: B knees blocked; bed pad used to facilitate forward movement and elevation of hips; pt bearing weight through BLE adn able to facilitate elevation of head    Balance Overall balance assessment: Needs assistance   Sitting balance-Leahy Scale: Zero       Standing balance-Leahy Scale: Zero                             ADL either performed or assessed with clinical judgement   ADL Overall ADL's : Needs assistance/impaired Eating/Feeding: NPO   Grooming: Maximal assistance Grooming Details (indicate cue type and reason): sitting EOB with total supoprt; pt holding head at midline adn able to bring washcloth to wipe mouth spontaneously; toothette used on  suction for oral care; once toothette placed in mouth, pt assisted with oral care. Definately able to pull toothette out of mouth                             Functional mobility during ADLs: Total assistance;+2 for physical assistance General ADL Comments: total A for all ADL with the exception of Max  A for grooming     Vision   Vision Assessment?: Vision impaired- to be further tested in functional context Additional Comments: Most likely R field cut; L gaze preference; difficulty sustaining visual attention, however improved from previous session   Perception     Praxis      Cognition Arousal/Alertness: Awake/alert Behavior During Therapy: Flat affect;Restless Overall Cognitive Status: Impaired/Different from baseline Area of Impairment: Attention;Following commands;Awareness;Problem solving                   Current Attention Level: Sustained   Following Commands: Follows one step commands inconsistently;Follows one step commands with increased time Safety/Judgement: Decreased awareness of deficits;Decreased awareness of safety Awareness: Intellectual Problem Solving: Slow processing;Decreased initiation;Difficulty sequencing;Requires verbal cues;Requires tactile cues          Exercises General Exercises - Upper Extremity Shoulder Flexion: Both;AROM;AAROM;10 reps;Supine Elbow Flexion: Both;10 reps;Supine Digit Composite Flexion: AAROM;AROM;PROM;Both;10 reps;Supine Composite Extension: PROM;AAROM;AROM;Both;10 reps;Seated Other Exercises Other Exercises: B PRAFOs repositioned   Shoulder Instructions       General Comments daughter in at end of session and updated on progress with OT    Pertinent Vitals/ Pain       Pain Assessment: Faces Faces Pain Scale: Hurts a little bit Pain Location:  (general discomfort) Pain Descriptors / Indicators: Discomfort Pain Intervention(s): Limited activity within patient's tolerance;Repositioned  Home Living                                          Prior Functioning/Environment              Frequency  Min 2X/week        Progress Toward Goals  OT Goals(current goals can now be found in the care plan section)  Progress towards OT goals: Progressing toward goals  Acute Rehab OT  Goals Patient Stated Goal: per daughter for pt to continue to improve OT Goal Formulation: Patient unable to participate in goal setting Time For Goal Achievement: 05/15/20 Potential to Achieve Goals: Fair ADL Goals Additional ADL Goal #1: Pt will follow 1 step commands with 75% accuracy in non-distracitng enviornment  Plan Discharge plan remains appropriate;Frequency remains appropriate    Co-evaluation                 AM-PAC OT "6 Clicks" Daily Activity     Outcome Measure   Help from another person eating meals?: Total Help from another person taking care of personal grooming?: A Lot Help from another person toileting, which includes using toliet, bedpan, or urinal?: Total Help from another person bathing (including washing, rinsing, drying)?: Total Help from another person to put on and taking off regular upper body clothing?: Total Help from another person to put on and taking off regular lower body clothing?: Total 6 Click Score: 7    End of Session Equipment Utilized During Treatment: Oxygen (TC)  OT Visit Diagnosis: Muscle weakness (generalized) (M62.81);Other abnormalities of gait and mobility (R26.89);Apraxia (R48.2);Ataxia, unspecified (R27.0);Feeding  difficulties (R63.3);Other symptoms and signs involving cognitive function;Other symptoms and signs involving the nervous system (R29.898)   Activity Tolerance Patient tolerated treatment well   Patient Left in bed;with call bell/phone within reach;with bed alarm set;with family/visitor present;with restraints reapplied (modified chari position)   Nurse Communication Mobility status;Precautions        Time: 0930-1010 OT Time Calculation (min): 40 min  Charges: OT General Charges $OT Visit: 1 Visit OT Treatments $Self Care/Home Management : 8-22 mins $Neuromuscular Re-education: 23-37 mins  Luisa Dago, OT/L   Acute OT Clinical Specialist Acute Rehabilitation Services Pager [phone] Office  [phone]    [facility] 05/03/2020, 3:22 PM

## 2020-05-03 NOTE — Progress Notes (Signed)
PHARMACY CONSULT NOTE FOR:  OUTPATIENT  PARENTERAL ANTIBIOTIC THERAPY (OPAT)  Indication: brain abscess Regimen: Ceftriaxone 2g IV q12h End date: 05/17/2020  IV antibiotic discharge orders are pended. To discharging provider:  please sign these orders via discharge navigator,  Select New Orders & click on the button choice - Manage This Unsigned Work.     Leia Alf, PharmD, BCPS Please check AMION for all [facility] contact numbers Clinical Pharmacist 05/03/2020 11:19 AM

## 2020-05-03 NOTE — TOC Transition Note (Addendum)
Transition of Care [facility]) - CM/SW Discharge Note   Patient Details  Name: Vickie Alvarado MRN: [id number] Date of Birth: [birth year]  [facility] CM/SW Contact:  Beckie Busing, RN Phone Number: [phone]  05/03/2020, 2:53 PM   Clinical Narrative:   CM has received bed confirmation. Daughter [name] made aware and wants patient discharged after her sister arrives after 4pm so that she cam pack her belongings and be here when her mother transfers. Daughter is agreeable to 5pm pickup. Bedside nurse has been made aware. Transport set up via [employer]. Discharge packet at nurses station. No further needs noted CM will sign off.   Please call report to [facility] Room # [phone]     Final next level of care: Skilled Nursing Facility Barriers to Discharge: No Barriers Identified   Patient Goals and CMS Choice Patient states their goals for this hospitalization and ongoing recovery are:: Patient is nonverbal CMS Medicare.gov Compare Post Acute Care list provided to:: Patient (daughter [name]) Choice offered to / list presented to : Adult Children  Discharge Placement                Patient to be transferred to facility by: PTAR Name of family member notified: [name] daughter Patient and family notified of of transfer: 05/03/20  Discharge Plan and Services In-house Referral: NA Discharge Planning Services: CM Consult            DME Arranged: N/A DME Agency: NA       HH Arranged: NA HH Agency: NA        Social Determinants of Health (SDOH) Interventions     Readmission Risk Interventions No flowsheet data found.

## 2020-05-03 NOTE — Progress Notes (Addendum)
Pt d/c via Medic via stretcher. Pt vitals taken and pt suctioned, WNLs. Pt placed on THEIR trach collar on settings of 6 per EMT. Pt DC/ed w/double lumen PICC. Pt has bags of belongings and TWO foot braces, which are ALL TAKEN by Medic. Pt family @bedside  earlier in shift and has taken other belongings. Family wasn't present @this  time. New AVS printed and given to Medic. Pt is stable @this  time.   2124 Spoke w/ pt daughter(Ishia) and made her aware that her mother left about 2100 w/ all her belongings. She is very appreciative for call.

## 2020-05-03 NOTE — Plan of Care (Signed)
  Problem: Clinical Measurements: Goal: Ability to maintain clinical measurements within normal limits will improve Outcome: Progressing Goal: Will remain free from infection Outcome: Progressing Goal: Diagnostic test results will improve Outcome: Progressing Goal: Respiratory complications will improve Outcome: Progressing Goal: Cardiovascular complication will be avoided Outcome: Progressing   Problem: Activity: Goal: Risk for activity intolerance will decrease Outcome: Progressing   Problem: Nutrition: Goal: Adequate nutrition will be maintained Outcome: Progressing   Problem: Coping: Goal: Level of anxiety will decrease Outcome: Progressing   Problem: Elimination: Goal: Will not experience complications related to bowel motility Outcome: Progressing Goal: Will not experience complications related to urinary retention Outcome: Progressing   Problem: Pain Managment: Goal: General experience of comfort will improve Outcome: Progressing   Problem: Safety: Goal: Ability to remain free from injury will improve Outcome: Progressing   Problem: Skin Integrity: Goal: Risk for impaired skin integrity will decrease Outcome: Progressing   Problem: Activity: Goal: Ability to tolerate increased activity will improve Outcome: Progressing   Problem: Respiratory: Goal: Ability to maintain a clear airway and adequate ventilation will improve Outcome: Progressing
